# Patient Record
Sex: Female | Born: 1950 | Race: White | Hispanic: No | Marital: Single
Health system: Southern US, Community
[De-identification: ages and names within clinical notes are randomized; demographics above are authoritative.]

## PROBLEM LIST (undated history)

## (undated) DIAGNOSIS — I509 Heart failure, unspecified: Secondary | ICD-10-CM

## (undated) DIAGNOSIS — I10 Essential (primary) hypertension: Secondary | ICD-10-CM

## (undated) DIAGNOSIS — I350 Nonrheumatic aortic (valve) stenosis: Secondary | ICD-10-CM

## (undated) DIAGNOSIS — E119 Type 2 diabetes mellitus without complications: Secondary | ICD-10-CM

## (undated) DIAGNOSIS — N189 Chronic kidney disease, unspecified: Secondary | ICD-10-CM

## (undated) DIAGNOSIS — Z950 Presence of cardiac pacemaker: Secondary | ICD-10-CM

## (undated) DIAGNOSIS — J449 Chronic obstructive pulmonary disease, unspecified: Secondary | ICD-10-CM

## (undated) DIAGNOSIS — I4891 Unspecified atrial fibrillation: Secondary | ICD-10-CM

## (undated) HISTORY — PX: PACEMAKER INSERTION: SHX728

## (undated) HISTORY — PX: ABDOMINAL HYSTERECTOMY: SHX81

---

## 2015-06-12 ENCOUNTER — Inpatient Hospital Stay (HOSPITAL_COMMUNITY)
Admission: EM | Admit: 2015-06-12 | Discharge: 2015-06-30 | DRG: 291 | Disposition: A | Discharge status: Skilled Nursing Facility | Payer: BLUE CROSS/BLUE SHIELD | Attending: Internal Medicine | Admitting: Internal Medicine

## 2015-06-12 ENCOUNTER — Encounter (HOSPITAL_COMMUNITY): Payer: Self-pay | Admitting: *Deleted

## 2015-06-12 ENCOUNTER — Emergency Department (HOSPITAL_COMMUNITY): Payer: BLUE CROSS/BLUE SHIELD

## 2015-06-12 DIAGNOSIS — D123 Benign neoplasm of transverse colon: Secondary | ICD-10-CM | POA: Diagnosis present

## 2015-06-12 DIAGNOSIS — Z6841 Body Mass Index (BMI) 40.0 and over, adult: Secondary | ICD-10-CM

## 2015-06-12 DIAGNOSIS — E669 Obesity, unspecified: Secondary | ICD-10-CM

## 2015-06-12 DIAGNOSIS — I482 Chronic atrial fibrillation, unspecified: Secondary | ICD-10-CM | POA: Diagnosis present

## 2015-06-12 DIAGNOSIS — R0602 Shortness of breath: Secondary | ICD-10-CM | POA: Diagnosis not present

## 2015-06-12 DIAGNOSIS — N390 Urinary tract infection, site not specified: Secondary | ICD-10-CM | POA: Diagnosis present

## 2015-06-12 DIAGNOSIS — Z9119 Patient's noncompliance with other medical treatment and regimen: Secondary | ICD-10-CM

## 2015-06-12 DIAGNOSIS — K921 Melena: Secondary | ICD-10-CM | POA: Diagnosis not present

## 2015-06-12 DIAGNOSIS — E1122 Type 2 diabetes mellitus with diabetic chronic kidney disease: Secondary | ICD-10-CM | POA: Diagnosis present

## 2015-06-12 DIAGNOSIS — K648 Other hemorrhoids: Secondary | ICD-10-CM | POA: Diagnosis present

## 2015-06-12 DIAGNOSIS — Z7722 Contact with and (suspected) exposure to environmental tobacco smoke (acute) (chronic): Secondary | ICD-10-CM | POA: Diagnosis present

## 2015-06-12 DIAGNOSIS — Z9981 Dependence on supplemental oxygen: Secondary | ICD-10-CM

## 2015-06-12 DIAGNOSIS — Z7951 Long term (current) use of inhaled steroids: Secondary | ICD-10-CM

## 2015-06-12 DIAGNOSIS — J440 Chronic obstructive pulmonary disease with acute lower respiratory infection: Secondary | ICD-10-CM | POA: Diagnosis present

## 2015-06-12 DIAGNOSIS — I130 Hypertensive heart and chronic kidney disease with heart failure and stage 1 through stage 4 chronic kidney disease, or unspecified chronic kidney disease: Secondary | ICD-10-CM | POA: Diagnosis not present

## 2015-06-12 DIAGNOSIS — J209 Acute bronchitis, unspecified: Secondary | ICD-10-CM | POA: Diagnosis present

## 2015-06-12 DIAGNOSIS — I272 Other secondary pulmonary hypertension: Secondary | ICD-10-CM | POA: Diagnosis present

## 2015-06-12 DIAGNOSIS — Z7901 Long term (current) use of anticoagulants: Secondary | ICD-10-CM

## 2015-06-12 DIAGNOSIS — J45909 Unspecified asthma, uncomplicated: Secondary | ICD-10-CM | POA: Diagnosis present

## 2015-06-12 DIAGNOSIS — Z950 Presence of cardiac pacemaker: Secondary | ICD-10-CM

## 2015-06-12 DIAGNOSIS — I472 Ventricular tachycardia: Secondary | ICD-10-CM | POA: Diagnosis not present

## 2015-06-12 DIAGNOSIS — B964 Proteus (mirabilis) (morganii) as the cause of diseases classified elsewhere: Secondary | ICD-10-CM | POA: Diagnosis present

## 2015-06-12 DIAGNOSIS — N179 Acute kidney failure, unspecified: Secondary | ICD-10-CM | POA: Diagnosis present

## 2015-06-12 DIAGNOSIS — G4733 Obstructive sleep apnea (adult) (pediatric): Secondary | ICD-10-CM | POA: Diagnosis present

## 2015-06-12 DIAGNOSIS — E1169 Type 2 diabetes mellitus with other specified complication: Secondary | ICD-10-CM

## 2015-06-12 DIAGNOSIS — Z794 Long term (current) use of insulin: Secondary | ICD-10-CM

## 2015-06-12 DIAGNOSIS — I5041 Acute combined systolic (congestive) and diastolic (congestive) heart failure: Secondary | ICD-10-CM | POA: Diagnosis present

## 2015-06-12 DIAGNOSIS — D62 Acute posthemorrhagic anemia: Secondary | ICD-10-CM | POA: Diagnosis not present

## 2015-06-12 DIAGNOSIS — I509 Heart failure, unspecified: Secondary | ICD-10-CM

## 2015-06-12 DIAGNOSIS — K644 Residual hemorrhoidal skin tags: Secondary | ICD-10-CM | POA: Diagnosis present

## 2015-06-12 DIAGNOSIS — N183 Chronic kidney disease, stage 3 (moderate): Secondary | ICD-10-CM | POA: Diagnosis present

## 2015-06-12 DIAGNOSIS — D649 Anemia, unspecified: Secondary | ICD-10-CM | POA: Diagnosis present

## 2015-06-12 DIAGNOSIS — D638 Anemia in other chronic diseases classified elsewhere: Secondary | ICD-10-CM | POA: Diagnosis present

## 2015-06-12 DIAGNOSIS — I5021 Acute systolic (congestive) heart failure: Secondary | ICD-10-CM | POA: Diagnosis present

## 2015-06-12 DIAGNOSIS — D696 Thrombocytopenia, unspecified: Secondary | ICD-10-CM | POA: Diagnosis present

## 2015-06-12 DIAGNOSIS — Z90710 Acquired absence of both cervix and uterus: Secondary | ICD-10-CM

## 2015-06-12 DIAGNOSIS — Z79899 Other long term (current) drug therapy: Secondary | ICD-10-CM

## 2015-06-12 DIAGNOSIS — I4729 Other ventricular tachycardia: Secondary | ICD-10-CM | POA: Insufficient documentation

## 2015-06-12 DIAGNOSIS — N184 Chronic kidney disease, stage 4 (severe): Secondary | ICD-10-CM | POA: Insufficient documentation

## 2015-06-12 HISTORY — DX: Essential (primary) hypertension: I10

## 2015-06-12 HISTORY — DX: Type 2 diabetes mellitus without complications: E11.9

## 2015-06-12 HISTORY — DX: Chronic obstructive pulmonary disease, unspecified: J44.9

## 2015-06-12 HISTORY — DX: Presence of cardiac pacemaker: Z95.0

## 2015-06-12 HISTORY — DX: Unspecified atrial fibrillation: I48.91

## 2015-06-12 LAB — CBC
HEMATOCRIT: 31.1 % — AB (ref 36.0–46.0)
HEMOGLOBIN: 9.4 g/dL — AB (ref 12.0–15.0)
MCH: 28.5 pg (ref 26.0–34.0)
MCHC: 30.2 g/dL (ref 30.0–36.0)
MCV: 94.2 fL (ref 78.0–100.0)
Platelets: 139 10*3/uL — ABNORMAL LOW (ref 150–400)
RBC: 3.3 MIL/uL — ABNORMAL LOW (ref 3.87–5.11)
RDW: 18 % — ABNORMAL HIGH (ref 11.5–15.5)
WBC: 7.9 10*3/uL (ref 4.0–10.5)

## 2015-06-12 LAB — BASIC METABOLIC PANEL
Anion gap: 13 (ref 5–15)
BUN: 38 mg/dL — AB (ref 6–20)
CHLORIDE: 107 mmol/L (ref 101–111)
CO2: 22 mmol/L (ref 22–32)
CREATININE: 1.86 mg/dL — AB (ref 0.44–1.00)
Calcium: 9.6 mg/dL (ref 8.9–10.3)
GFR calc Af Amer: 31 mL/min — ABNORMAL LOW (ref >60)
GFR calc non Af Amer: 27 mL/min — ABNORMAL LOW (ref >60)
GLUCOSE: 117 mg/dL — AB (ref 65–99)
Potassium: 5 mmol/L (ref 3.5–5.1)
SODIUM: 142 mmol/L (ref 135–145)

## 2015-06-12 LAB — I-STAT TROPONIN, ED: TROPONIN I, POC: 0.01 ng/mL (ref 0.00–0.08)

## 2015-06-12 MED ORDER — FUROSEMIDE 10 MG/ML IJ SOLN
80.0000 mg | Freq: Once | INTRAMUSCULAR | Status: AC
  Administered 2015-06-13: 80 mg via INTRAVENOUS
  Filled 2015-06-12: qty 8

## 2015-06-12 NOTE — ED Notes (Addendum)
Pt ambulated to get weight, rechecked O2 sats after obtaining weight, pt's O2 dropped to 87%, pt placed on 2 lpm, O2 sats increased to 92%

## 2015-06-12 NOTE — ED Provider Notes (Signed)
CSN: [id number]     Arrival date & time 06/12/15  1900 History   By signing my name below, I, Robyn Porter, attest that this documentation has been prepared under the direction and in the presence of Orpah Greek, MD . Electronically Signed: [name], Scribe. 06/13/2015. 12:10 AM.    Chief Complaint  Patient presents with  . Shortness of Breath    The history is provided by the patient. No language interpreter was used.   HPI Comments:  Robyn Porter is a 65 y.o. female with a history of CHF, COPD and asthma, who presents to the Emergency Department complaining of increased SOB x a few days. Pt is on 2L home O2 and states she has needed to use it more in the last few weeks than usual. She reports associated weight gain; states she has gained 40 pounds in the last ~8-9 weeks.  Pt was seen by [facility] ED last night for similar symptoms; she was given lasix and steroids and discharged home. Pt was advised to come to the ED tonight by home health nurse. No alleviating factors noted.  Past Medical History  Diagnosis Date  . COPD (chronic obstructive pulmonary disease) ([place])   . Hypertension   . Diabetes mellitus without complication ([place])   . Afib ([place])   . Pacemaker    Past Surgical History  Procedure Laterality Date  . Pacemaker insertion    . Abdominal hysterectomy     History reviewed. No pertinent family history. Social History  Substance Use Topics  . Smoking status: Passive Smoke Exposure - Never Smoker  . Smokeless tobacco: Never Used  . Alcohol Use: No   OB History    No data available     Review of Systems  Constitutional: Positive for unexpected weight change (weight gain). Negative for chills and fatigue.  Respiratory: Positive for shortness of breath.   All other systems reviewed and are negative.   Allergies  Amoxicillin and Contrast media  Home Medications   Prior to Admission medications   Medication Sig Start Date End Date Taking?  Authorizing Provider  albuterol (PROVENTIL HFA;VENTOLIN HFA) 108 (90 Base) MCG/ACT inhaler Inhale 1 puff into the lungs every 6 (six) hours as needed for wheezing or shortness of breath.   Yes Historical Provider, MD  albuterol (PROVENTIL) (2.5 MG/3ML) 0.083% nebulizer solution Take 2.5 mg by nebulization every 8 (eight) hours as needed for wheezing.  05/22/15  Yes Historical Provider, MD  allopurinol (ZYLOPRIM) 100 MG tablet Take 100 mg by mouth daily.   Yes Historical Provider, MD  atorvastatin (LIPITOR) 20 MG tablet Take 20 mg by mouth daily.   Yes Historical Provider, MD  benzonatate (TESSALON) 200 MG capsule Take 200 mg by mouth 3 (three) times daily as needed for cough.   Yes Historical Provider, MD  budesonide-formoterol (SYMBICORT) 80-4.5 MCG/ACT inhaler Inhale 2 puffs into the lungs 2 (two) times daily.   Yes Historical Provider, MD  BYDUREON 2 MG PEN Inject 2 mg as directed every 7 (seven) days. On Saturday 05/29/15  Yes Historical Provider, MD  Cholecalciferol (VITAMIN D3) 5000 units CAPS Take 1 capsule by mouth daily.   Yes Historical Provider, MD  cyanocobalamin 500 MCG tablet Take 500 mcg by mouth daily.   Yes Historical Provider, MD  ferrous sulfate 325 (65 FE) MG tablet Take 650 mg by mouth 2 (two) times daily with a meal.   Yes Historical Provider, MD  furosemide (LASIX) 20 MG tablet Take 20 mg by mouth  daily as needed for fluid.   Yes Historical Provider, MD  loratadine (CLARITIN) 10 MG tablet Take 10 mg by mouth daily.   Yes Historical Provider, MD  losartan (COZAAR) 50 MG tablet Take 50 mg by mouth daily.   Yes Historical Provider, MD  metoprolol (LOPRESSOR) 50 MG tablet Take 50 mg by mouth 2 (two) times daily.   Yes Historical Provider, MD  Multiple Vitamin (MULTIVITAMIN WITH MINERALS) TABS tablet Take 1 tablet by mouth daily.   Yes Historical Provider, MD  rivaroxaban (XARELTO) 20 MG TABS tablet Take 20 mg by mouth daily with supper.   Yes Historical Provider, MD  vitamin C  (ASCORBIC ACID) 500 MG tablet Take 500 mg by mouth daily.   Yes Historical Provider, MD   BP 174/84 mmHg  Pulse 84  Temp(Src) 97.6 F (36.4 C) (Oral)  Resp 18  Wt 423 lb 11.2 oz (192.189 kg)  SpO2 98% Physical Exam  Constitutional: She is oriented to person, place, and time. She appears well-developed. No distress.  Obese  HENT:  Head: Normocephalic and atraumatic.  Right Ear: Hearing normal.  Left Ear: Hearing normal.  Nose: Nose normal.  Mouth/Throat: Oropharynx is clear and moist and mucous membranes are normal.  Eyes: Conjunctivae and EOM are normal. Pupils are equal, round, and reactive to light.  Neck: Normal range of motion. Neck supple.  Cardiovascular: Regular rhythm, S1 normal and S2 normal.  Exam reveals no gallop and no friction rub.   No murmur heard. Pulmonary/Chest: Effort normal. Tachypnea noted. No respiratory distress. She has rales. She exhibits no tenderness.  Tachypnic and dyspneic  Crackels at the basis   Abdominal: Soft. Normal appearance and bowel sounds are normal. There is no hepatosplenomegaly. There is no tenderness. There is no rebound, no guarding, no tenderness at McBurney's point and negative Murphy's sign. No hernia.  Musculoskeletal: Normal range of motion. She exhibits edema (4+ pitting edema bilaterally).  LLE weeping clear fluid without wounds   Neurological: She is alert and oriented to person, place, and time. She has normal strength. No cranial nerve deficit or sensory deficit. Coordination normal. GCS eye subscore is 4. GCS verbal subscore is 5. GCS motor subscore is 6.  Skin: Skin is warm, dry and intact. No rash noted. No cyanosis.  Psychiatric: She has a normal mood and affect. Her speech is normal and behavior is normal. Thought content normal.  Nursing note and vitals reviewed.   ED Course  Procedures   DIAGNOSTIC STUDIES:  Oxygen Saturation is 98% on RA, normal by my interpretation.    COORDINATION OF CARE:  11:33 PM Pt  informed of admission. Discussed treatment plan with pt at bedside and pt agreed to plan.  Labs Review Labs Reviewed  BASIC METABOLIC PANEL - Abnormal; Notable for the following:    Glucose, Bld 117 (*)    BUN 38 (*)    Creatinine, Ser 1.86 (*)    GFR calc non Af Amer 27 (*)    GFR calc Af Amer 31 (*)    All other components within normal limits  CBC - Abnormal; Notable for the following:    RBC 3.30 (*)    Hemoglobin 9.4 (*)    HCT 31.1 (*)    RDW 18.0 (*)    Platelets 139 (*)    All other components within normal limits  BRAIN NATRIURETIC PEPTIDE - Abnormal; Notable for the following:    B Natriuretic Peptide 940.9 (*)    All other components within normal  limits  Randolm Idol, ED    Imaging Review Dg Chest 2 View  06/12/2015  CLINICAL DATA:  65 year old female with shortness of breath and cough for the past month EXAM: CHEST  2 VIEW COMPARISON:  None. FINDINGS: Left subclavian approach single lead cardiac rhythm maintenance device with the lead projected over the right ventricle. Cardiomegaly. Pulmonary vascular congestion with mild interstitial edema. Small bilateral pleural effusions with associated bibasilar atelectasis. No acute osseous abnormality. IMPRESSION: Mild-moderate CHF. Electronically Signed   By: Jacqulynn Cadet M.D.   On: 06/12/2015 20:23   I have personally reviewed and evaluated these images and lab results as part of my medical decision-making.   EKG Interpretation   Date/Time:  Tuesday June 12 2015 19:12:48 EST Ventricular Rate:  78 PR Interval:    QRS Duration: 148 QT Interval:  432 QTC Calculation: 492 R Axis:   -65 Text Interpretation:  Ventricular-paced rhythm with occasional Premature  ventricular complexes Abnormal ECG No previous tracing Confirmed by  Jerald Villalona  MD, Aileene Lanum [id number]) on 06/13/2015 12:12:30 AM      MDM   Final diagnoses:  Acute on chronic congestive heart failure, unspecified congestive heart failure type [facility])     Patient presents to the ER for evaluation of difficulty breathing. Patient has a history of congestive heart failure and COPD. She reports that she has been seen in the emergency department twice over the last few weeks for difficulty breathing and volume overload. She was seen last night in [place], given IV Lasix and told to increase her oral Lasix. She was visited by home health nursing today who noted that she was severely dyspneic and her weight had increased, recommended coming back to the ER.  Patient has noticed severe decrease of her exercise tolerance. She is unable to ambulate at all secondary to severe dyspnea. She does use oxygen at home, as needed. She reports that she has not had any periods where she did not need it recently, however. She reports an  approximate 40 pound weight gain over the last 2 months.   EKG is ventricular paced. Troponin was negative. She does have an elevated BNP and chest x-ray shows evidence of congestive heart failure. Based on the fact that she was given IV Lasix last night in another ER and increased her oral Lasix with no improvement, she will require hospitalization for further management of congestive heart failure exacerbation.  I personally performed the services described in this documentation, which was scribed in my presence. The recorded information has been reviewed and is accurate.    Orpah Greek, MD 06/13/15 [phone]

## 2015-06-12 NOTE — ED Notes (Signed)
Pt coming from home with c/o shortness of breath. Pt was seen by [facility] ED last night for similar symptoms. Pt was seen by home health nurse today and suggested pt go to [facility] due to increase fluid retention and shortness of breath. Pt's home health nurse has not seen pt since January. Pt denies CP. Pt wears 2lpm at home, CPAP at bedtime.

## 2015-06-13 ENCOUNTER — Encounter (HOSPITAL_COMMUNITY): Payer: Self-pay | Admitting: Internal Medicine

## 2015-06-13 DIAGNOSIS — Z7901 Long term (current) use of anticoagulants: Secondary | ICD-10-CM | POA: Diagnosis not present

## 2015-06-13 DIAGNOSIS — Z794 Long term (current) use of insulin: Secondary | ICD-10-CM | POA: Diagnosis not present

## 2015-06-13 DIAGNOSIS — I509 Heart failure, unspecified: Secondary | ICD-10-CM | POA: Insufficient documentation

## 2015-06-13 DIAGNOSIS — D649 Anemia, unspecified: Secondary | ICD-10-CM | POA: Diagnosis present

## 2015-06-13 DIAGNOSIS — I5033 Acute on chronic diastolic (congestive) heart failure: Secondary | ICD-10-CM | POA: Diagnosis not present

## 2015-06-13 DIAGNOSIS — I130 Hypertensive heart and chronic kidney disease with heart failure and stage 1 through stage 4 chronic kidney disease, or unspecified chronic kidney disease: Secondary | ICD-10-CM | POA: Diagnosis present

## 2015-06-13 DIAGNOSIS — J440 Chronic obstructive pulmonary disease with acute lower respiratory infection: Secondary | ICD-10-CM | POA: Diagnosis present

## 2015-06-13 DIAGNOSIS — N179 Acute kidney failure, unspecified: Secondary | ICD-10-CM | POA: Diagnosis present

## 2015-06-13 DIAGNOSIS — I429 Cardiomyopathy, unspecified: Secondary | ICD-10-CM | POA: Diagnosis not present

## 2015-06-13 DIAGNOSIS — Z950 Presence of cardiac pacemaker: Secondary | ICD-10-CM

## 2015-06-13 DIAGNOSIS — Z9119 Patient's noncompliance with other medical treatment and regimen: Secondary | ICD-10-CM | POA: Diagnosis not present

## 2015-06-13 DIAGNOSIS — D638 Anemia in other chronic diseases classified elsewhere: Secondary | ICD-10-CM | POA: Diagnosis present

## 2015-06-13 DIAGNOSIS — E119 Type 2 diabetes mellitus without complications: Secondary | ICD-10-CM | POA: Diagnosis not present

## 2015-06-13 DIAGNOSIS — I5023 Acute on chronic systolic (congestive) heart failure: Secondary | ICD-10-CM | POA: Diagnosis not present

## 2015-06-13 DIAGNOSIS — D123 Benign neoplasm of transverse colon: Secondary | ICD-10-CM | POA: Diagnosis not present

## 2015-06-13 DIAGNOSIS — Z79899 Other long term (current) drug therapy: Secondary | ICD-10-CM | POA: Diagnosis not present

## 2015-06-13 DIAGNOSIS — I5021 Acute systolic (congestive) heart failure: Secondary | ICD-10-CM | POA: Diagnosis not present

## 2015-06-13 DIAGNOSIS — K648 Other hemorrhoids: Secondary | ICD-10-CM | POA: Diagnosis present

## 2015-06-13 DIAGNOSIS — Z9981 Dependence on supplemental oxygen: Secondary | ICD-10-CM | POA: Diagnosis not present

## 2015-06-13 DIAGNOSIS — K644 Residual hemorrhoidal skin tags: Secondary | ICD-10-CM | POA: Diagnosis present

## 2015-06-13 DIAGNOSIS — G4733 Obstructive sleep apnea (adult) (pediatric): Secondary | ICD-10-CM | POA: Diagnosis present

## 2015-06-13 DIAGNOSIS — E669 Obesity, unspecified: Secondary | ICD-10-CM | POA: Diagnosis not present

## 2015-06-13 DIAGNOSIS — I482 Chronic atrial fibrillation, unspecified: Secondary | ICD-10-CM | POA: Diagnosis present

## 2015-06-13 DIAGNOSIS — Z6841 Body Mass Index (BMI) 40.0 and over, adult: Secondary | ICD-10-CM | POA: Diagnosis not present

## 2015-06-13 DIAGNOSIS — D62 Acute posthemorrhagic anemia: Secondary | ICD-10-CM | POA: Diagnosis not present

## 2015-06-13 DIAGNOSIS — N183 Chronic kidney disease, stage 3 (moderate): Secondary | ICD-10-CM | POA: Diagnosis present

## 2015-06-13 DIAGNOSIS — B964 Proteus (mirabilis) (morganii) as the cause of diseases classified elsewhere: Secondary | ICD-10-CM | POA: Diagnosis present

## 2015-06-13 DIAGNOSIS — J209 Acute bronchitis, unspecified: Secondary | ICD-10-CM | POA: Diagnosis present

## 2015-06-13 DIAGNOSIS — K921 Melena: Secondary | ICD-10-CM | POA: Diagnosis not present

## 2015-06-13 DIAGNOSIS — Z7951 Long term (current) use of inhaled steroids: Secondary | ICD-10-CM | POA: Diagnosis not present

## 2015-06-13 DIAGNOSIS — N390 Urinary tract infection, site not specified: Secondary | ICD-10-CM | POA: Diagnosis present

## 2015-06-13 DIAGNOSIS — J45909 Unspecified asthma, uncomplicated: Secondary | ICD-10-CM | POA: Diagnosis present

## 2015-06-13 DIAGNOSIS — D696 Thrombocytopenia, unspecified: Secondary | ICD-10-CM | POA: Diagnosis present

## 2015-06-13 DIAGNOSIS — E1122 Type 2 diabetes mellitus with diabetic chronic kidney disease: Secondary | ICD-10-CM | POA: Diagnosis present

## 2015-06-13 DIAGNOSIS — Z90710 Acquired absence of both cervix and uterus: Secondary | ICD-10-CM | POA: Diagnosis not present

## 2015-06-13 DIAGNOSIS — E1169 Type 2 diabetes mellitus with other specified complication: Secondary | ICD-10-CM

## 2015-06-13 DIAGNOSIS — R0602 Shortness of breath: Secondary | ICD-10-CM | POA: Diagnosis present

## 2015-06-13 DIAGNOSIS — Z7722 Contact with and (suspected) exposure to environmental tobacco smoke (acute) (chronic): Secondary | ICD-10-CM | POA: Diagnosis present

## 2015-06-13 DIAGNOSIS — R0600 Dyspnea, unspecified: Secondary | ICD-10-CM | POA: Diagnosis not present

## 2015-06-13 DIAGNOSIS — I5041 Acute combined systolic (congestive) and diastolic (congestive) heart failure: Secondary | ICD-10-CM | POA: Diagnosis present

## 2015-06-13 DIAGNOSIS — I472 Ventricular tachycardia: Secondary | ICD-10-CM | POA: Diagnosis not present

## 2015-06-13 DIAGNOSIS — I272 Other secondary pulmonary hypertension: Secondary | ICD-10-CM | POA: Diagnosis present

## 2015-06-13 LAB — CBC WITH DIFFERENTIAL/PLATELET
Basophils Absolute: 0 10*3/uL (ref 0.0–0.1)
Basophils Relative: 0 %
EOS ABS: 0 10*3/uL (ref 0.0–0.7)
Eosinophils Relative: 0 %
HEMATOCRIT: 30.2 % — AB (ref 36.0–46.0)
HEMOGLOBIN: 9.1 g/dL — AB (ref 12.0–15.0)
LYMPHS ABS: 1.1 10*3/uL (ref 0.7–4.0)
LYMPHS PCT: 11 %
MCH: 28.5 pg (ref 26.0–34.0)
MCHC: 30.1 g/dL (ref 30.0–36.0)
MCV: 94.7 fL (ref 78.0–100.0)
MONOS PCT: 7 %
Monocytes Absolute: 0.7 10*3/uL (ref 0.1–1.0)
NEUTROS ABS: 7.6 10*3/uL (ref 1.7–7.7)
NEUTROS PCT: 81 %
Platelets: 152 10*3/uL (ref 150–400)
RBC: 3.19 MIL/uL — ABNORMAL LOW (ref 3.87–5.11)
RDW: 17.9 % — ABNORMAL HIGH (ref 11.5–15.5)
WBC: 9.4 10*3/uL (ref 4.0–10.5)

## 2015-06-13 LAB — URINE MICROSCOPIC-ADD ON

## 2015-06-13 LAB — GLUCOSE, CAPILLARY
GLUCOSE-CAPILLARY: 86 mg/dL (ref 65–99)
GLUCOSE-CAPILLARY: 118 mg/dL — AB (ref 65–99)
Glucose-Capillary: 127 mg/dL — ABNORMAL HIGH (ref 65–99)
Glucose-Capillary: 90 mg/dL (ref 65–99)
Glucose-Capillary: 127 mg/dL — ABNORMAL HIGH (ref 65–99)

## 2015-06-13 LAB — URINALYSIS, ROUTINE W REFLEX MICROSCOPIC
Bilirubin Urine: NEGATIVE
Glucose, UA: NEGATIVE mg/dL
Hgb urine dipstick: NEGATIVE
Ketones, ur: NEGATIVE mg/dL
NITRITE: POSITIVE — AB
PH: 5 (ref 5.0–8.0)
Protein, ur: 30 mg/dL — AB
SPECIFIC GRAVITY, URINE: 1.017 (ref 1.005–1.030)

## 2015-06-13 LAB — BASIC METABOLIC PANEL
ANION GAP: 9 (ref 5–15)
BUN: 42 mg/dL — AB (ref 6–20)
CO2: 24 mmol/L (ref 22–32)
Calcium: 9.3 mg/dL (ref 8.9–10.3)
Chloride: 110 mmol/L (ref 101–111)
Creatinine, Ser: 1.96 mg/dL — ABNORMAL HIGH (ref 0.44–1.00)
GFR calc Af Amer: 29 mL/min — ABNORMAL LOW (ref >60)
GFR calc non Af Amer: 25 mL/min — ABNORMAL LOW (ref >60)
GLUCOSE: 130 mg/dL — AB (ref 65–99)
POTASSIUM: 4.9 mmol/L (ref 3.5–5.1)
Sodium: 143 mmol/L (ref 135–145)

## 2015-06-13 LAB — TROPONIN I
TROPONIN I: 0.03 ng/mL (ref <0.031)
Troponin I: 0.03 ng/mL (ref <0.031)
Troponin I: 0.03 ng/mL (ref <0.031)

## 2015-06-13 LAB — BRAIN NATRIURETIC PEPTIDE: B Natriuretic Peptide: 940.9 pg/mL — ABNORMAL HIGH (ref 0.0–100.0)

## 2015-06-13 MED ORDER — FUROSEMIDE 10 MG/ML IJ SOLN
60.0000 mg | Freq: Two times a day (BID) | INTRAMUSCULAR | Status: DC
  Administered 2015-06-13: 60 mg via INTRAVENOUS
  Filled 2015-06-13: qty 6

## 2015-06-13 MED ORDER — ALLOPURINOL 100 MG PO TABS
100.0000 mg | ORAL_TABLET | Freq: Every day | ORAL | Status: DC
  Administered 2015-06-13 – 2015-06-30 (×18): 100 mg via ORAL
  Filled 2015-06-13 (×18): qty 1

## 2015-06-13 MED ORDER — RIVAROXABAN 20 MG PO TABS
20.0000 mg | ORAL_TABLET | Freq: Every day | ORAL | Status: DC
  Administered 2015-06-13 – 2015-06-24 (×12): 20 mg via ORAL
  Filled 2015-06-13 (×12): qty 1

## 2015-06-13 MED ORDER — ATORVASTATIN CALCIUM 20 MG PO TABS
20.0000 mg | ORAL_TABLET | Freq: Every day | ORAL | Status: DC
  Administered 2015-06-13 – 2015-06-30 (×18): 20 mg via ORAL
  Filled 2015-06-13 (×18): qty 1

## 2015-06-13 MED ORDER — INSULIN ASPART 100 UNIT/ML ~~LOC~~ SOLN
0.0000 [IU] | Freq: Three times a day (TID) | SUBCUTANEOUS | Status: DC
  Administered 2015-06-14: 1 [IU] via SUBCUTANEOUS
  Administered 2015-06-15: 2 [IU] via SUBCUTANEOUS
  Administered 2015-06-18 – 2015-06-22 (×4): 1 [IU] via SUBCUTANEOUS

## 2015-06-13 MED ORDER — SODIUM CHLORIDE 0.9% FLUSH
3.0000 mL | Freq: Two times a day (BID) | INTRAVENOUS | Status: DC
  Administered 2015-06-13 – 2015-06-22 (×18): 3 mL via INTRAVENOUS

## 2015-06-13 MED ORDER — ALBUTEROL SULFATE (2.5 MG/3ML) 0.083% IN NEBU: 2.5000 mg | INHALATION_SOLUTION | Freq: Four times a day (QID) | RESPIRATORY_TRACT | Status: DC | PRN

## 2015-06-13 MED ORDER — FERROUS SULFATE 325 (65 FE) MG PO TABS
650.0000 mg | ORAL_TABLET | Freq: Two times a day (BID) | ORAL | Status: DC
  Administered 2015-06-13 – 2015-06-24 (×24): 650 mg via ORAL
  Filled 2015-06-13 (×25): qty 2

## 2015-06-13 MED ORDER — ONDANSETRON HCL 4 MG PO TABS: 4.0000 mg | ORAL_TABLET | Freq: Four times a day (QID) | ORAL | Status: DC | PRN

## 2015-06-13 MED ORDER — LOSARTAN POTASSIUM 50 MG PO TABS
50.0000 mg | ORAL_TABLET | Freq: Every day | ORAL | Status: DC
  Administered 2015-06-13 – 2015-06-20 (×8): 50 mg via ORAL
  Filled 2015-06-13 (×8): qty 1

## 2015-06-13 MED ORDER — ADULT MULTIVITAMIN W/MINERALS CH
1.0000 | ORAL_TABLET | Freq: Every day | ORAL | Status: DC
  Administered 2015-06-13 – 2015-06-30 (×18): 1 via ORAL
  Filled 2015-06-13 (×18): qty 1

## 2015-06-13 MED ORDER — BUDESONIDE-FORMOTEROL FUMARATE 80-4.5 MCG/ACT IN AERO
2.0000 | INHALATION_SPRAY | Freq: Two times a day (BID) | RESPIRATORY_TRACT | Status: DC
  Administered 2015-06-13 – 2015-06-30 (×33): 2 via RESPIRATORY_TRACT
  Filled 2015-06-13 (×2): qty 6.9

## 2015-06-13 MED ORDER — METOPROLOL TARTRATE 50 MG PO TABS
50.0000 mg | ORAL_TABLET | Freq: Two times a day (BID) | ORAL | Status: DC
  Administered 2015-06-13 – 2015-06-14 (×4): 50 mg via ORAL
  Filled 2015-06-13 (×5): qty 1

## 2015-06-13 MED ORDER — ALBUTEROL SULFATE HFA 108 (90 BASE) MCG/ACT IN AERS: 1.0000 | INHALATION_SPRAY | Freq: Four times a day (QID) | RESPIRATORY_TRACT | Status: DC | PRN

## 2015-06-13 MED ORDER — PREDNISONE 20 MG PO TABS
40.0000 mg | ORAL_TABLET | Freq: Every day | ORAL | Status: AC
  Administered 2015-06-13 – 2015-06-15 (×3): 40 mg via ORAL
  Filled 2015-06-13 (×3): qty 2

## 2015-06-13 MED ORDER — VITAMIN D 1000 UNITS PO TABS
5000.0000 [IU] | ORAL_TABLET | Freq: Every day | ORAL | Status: DC
  Administered 2015-06-13 – 2015-06-30 (×17): 5000 [IU] via ORAL
  Filled 2015-06-13 (×17): qty 5

## 2015-06-13 MED ORDER — VITAMIN B-12 1000 MCG PO TABS
500.0000 ug | ORAL_TABLET | Freq: Every day | ORAL | Status: DC
  Administered 2015-06-13 – 2015-06-30 (×18): 500 ug via ORAL
  Filled 2015-06-13 (×18): qty 1

## 2015-06-13 MED ORDER — LORATADINE 10 MG PO TABS
10.0000 mg | ORAL_TABLET | Freq: Every day | ORAL | Status: DC
  Administered 2015-06-13 – 2015-06-30 (×18): 10 mg via ORAL
  Filled 2015-06-13 (×19): qty 1

## 2015-06-13 MED ORDER — VITAMIN C 500 MG PO TABS
500.0000 mg | ORAL_TABLET | Freq: Every day | ORAL | Status: DC
  Administered 2015-06-13 – 2015-06-30 (×18): 500 mg via ORAL
  Filled 2015-06-13 (×18): qty 1

## 2015-06-13 MED ORDER — ONDANSETRON HCL 4 MG/2ML IJ SOLN: 4.0000 mg | Freq: Four times a day (QID) | INTRAMUSCULAR | Status: DC | PRN

## 2015-06-13 MED ORDER — FUROSEMIDE 10 MG/ML IJ SOLN
40.0000 mg | Freq: Two times a day (BID) | INTRAMUSCULAR | Status: DC
Start: 2015-06-13 — End: 2015-06-14
  Administered 2015-06-13 – 2015-06-14 (×2): 40 mg via INTRAVENOUS
  Filled 2015-06-13 (×2): qty 4

## 2015-06-13 MED ORDER — DEXTROSE 5 % IV SOLN: 1.0000 g | INTRAVENOUS | Status: DC

## 2015-06-13 MED ORDER — IPRATROPIUM-ALBUTEROL 0.5-2.5 (3) MG/3ML IN SOLN
3.0000 mL | Freq: Four times a day (QID) | RESPIRATORY_TRACT | Status: DC
  Administered 2015-06-13 (×4): 3 mL via RESPIRATORY_TRACT
  Filled 2015-06-13 (×4): qty 3

## 2015-06-13 MED ORDER — ACETAMINOPHEN 325 MG PO TABS
650.0000 mg | ORAL_TABLET | Freq: Four times a day (QID) | ORAL | Status: DC | PRN
  Administered 2015-06-22: 650 mg via ORAL
  Filled 2015-06-13: qty 2

## 2015-06-13 MED ORDER — BENZONATATE 100 MG PO CAPS
200.0000 mg | ORAL_CAPSULE | Freq: Three times a day (TID) | ORAL | Status: DC | PRN
  Administered 2015-06-24 – 2015-06-30 (×6): 200 mg via ORAL
  Filled 2015-06-13 (×7): qty 2

## 2015-06-13 MED ORDER — ACETAMINOPHEN 650 MG RE SUPP: 650.0000 mg | Freq: Four times a day (QID) | RECTAL | Status: DC | PRN

## 2015-06-13 NOTE — ED Notes (Signed)
Attempted to call report

## 2015-06-13 NOTE — H&P (Signed)
[facility] History and Physical  Robyn Porter DOB: [birth year] DOA: 06/12/2015  Referring physician: Dr. Tawanna Porter. PCP: No primary care provider on file. patient follows up at [facility]. Specialists: None.  Chief Complaint: Shortness of breath.  HPI: Robyn Porter is a 65 y.o. female with history of diastolic CHF last EF measured in November 2016 was 50-55% presents to the ER because of increasing shortness of breath and increased weight. Patient states that over the last 1 month patient has been noticing increasing shortness of breath both on sitting and exertion. Denies any chest pain. Patient's Lasix was recently increased to 20 mg twice a day. Patient states she has been to the ER at least twice over the last 1 month. Patient was admitted at [facility] in November for Streptococcus bacteremia secondary to cellulitis at that time patient had 2-D echo and transesophageal echocardiogram which showed EF of 50-55%. At this time chest x-ray shows congestion and patient has been placed on Lasix and admitted for CHF. Patient denies any nausea vomiting abdominal pain or diarrhea.   Review of Systems: As presented in the history of presenting illness, rest negative.  Past Medical History  Diagnosis Date  . COPD (chronic obstructive pulmonary disease) ([place])   . Hypertension   . Diabetes mellitus without complication ([place])   . Afib ([place])   . Pacemaker    Past Surgical History  Procedure Laterality Date  . Pacemaker insertion    . Abdominal hysterectomy     Social History:  reports that she has been passively smoking.  She has never used smokeless tobacco. She reports that she does not drink alcohol or use illicit drugs. Where does patient live home. Can patient participate in ADLs? Yes.  Allergies  Allergen Reactions  . Amoxicillin Hives  . Contrast Media [Iodinated Diagnostic Agents] Hives    Family History:   Family History  Problem Relation Age of Onset  . Angina Sister   . Emphysema Sister   . Colon cancer Brother       Prior to Admission medications   Medication Sig Start Date End Date Taking? Authorizing Provider  albuterol (PROVENTIL HFA;VENTOLIN HFA) 108 (90 Base) MCG/ACT inhaler Inhale 1 puff into the lungs every 6 (six) hours as needed for wheezing or shortness of breath.   Yes Historical Provider, MD  albuterol (PROVENTIL) (2.5 MG/3ML) 0.083% nebulizer solution Take 2.5 mg by nebulization every 8 (eight) hours as needed for wheezing.  05/22/15  Yes Historical Provider, MD  allopurinol (ZYLOPRIM) 100 MG tablet Take 100 mg by mouth daily.   Yes Historical Provider, MD  atorvastatin (LIPITOR) 20 MG tablet Take 20 mg by mouth daily.   Yes Historical Provider, MD  benzonatate (TESSALON) 200 MG capsule Take 200 mg by mouth 3 (three) times daily as needed for cough.   Yes Historical Provider, MD  budesonide-formoterol (SYMBICORT) 80-4.5 MCG/ACT inhaler Inhale 2 puffs into the lungs 2 (two) times daily.   Yes Historical Provider, MD  BYDUREON 2 MG PEN Inject 2 mg as directed every 7 (seven) days. On Saturday 05/29/15  Yes Historical Provider, MD  Cholecalciferol (VITAMIN D3) 5000 units CAPS Take 1 capsule by mouth daily.   Yes Historical Provider, MD  cyanocobalamin 500 MCG tablet Take 500 mcg by mouth daily.   Yes Historical Provider, MD  ferrous sulfate 325 (65 FE) MG tablet Take 650 mg by mouth 2 (two) times daily with a meal.   Yes Historical Provider, MD  furosemide (  LASIX) 20 MG tablet Take 20 mg by mouth daily as needed for fluid.   Yes Historical Provider, MD  loratadine (CLARITIN) 10 MG tablet Take 10 mg by mouth daily.   Yes Historical Provider, MD  losartan (COZAAR) 50 MG tablet Take 50 mg by mouth daily.   Yes Historical Provider, MD  metoprolol (LOPRESSOR) 50 MG tablet Take 50 mg by mouth 2 (two) times daily.   Yes Historical Provider, MD  Multiple Vitamin (MULTIVITAMIN WITH MINERALS)  TABS tablet Take 1 tablet by mouth daily.   Yes Historical Provider, MD  rivaroxaban (XARELTO) 20 MG TABS tablet Take 20 mg by mouth daily with supper.   Yes Historical Provider, MD  vitamin C (ASCORBIC ACID) 500 MG tablet Take 500 mg by mouth daily.   Yes Historical Provider, MD    Physical Exam: Filed Vitals:   06/12/15 1915 06/12/15 2119 06/12/15 2345 06/13/15 0209  BP: 141/82 135/62 174/84 128/61  Pulse: 57 73 84 70  Temp: 97.8 F (36.6 C) 97.6 F (36.4 C)    TempSrc: Oral Oral    Resp: 24 20 18 28   Weight: 192.189 kg (423 lb 11.2 oz)     SpO2: 93% 98% 98% 100%     General:  Moderately built and nourished.  Eyes: Anicteric no pallor.  ENT: No discharge from the ears eyes nose mouth.  Neck: JVD not appreciated.  Cardiovascular: S1 and S2 heard.  Respiratory: No rhonchi or crepitations.  Abdomen: Soft nontender bowel sounds present.  Skin: Chronic skin changes.  Musculoskeletal: Bilateral lower extremity edema.  Psychiatric: Appears normal.  Neurologic: Alert awake oriented to time place and person. Moves all extremities.  Labs on Admission:  Basic Metabolic Panel:  Recent Labs Lab 06/12/15 1938  NA 142  K 5.0  CL 107  CO2 22  GLUCOSE 117*  BUN 38*  CREATININE 1.86*  CALCIUM 9.6   Liver Function Tests: No results for input(s): AST, ALT, ALKPHOS, BILITOT, PROT, ALBUMIN in the last 168 hours. No results for input(s): LIPASE, AMYLASE in the last 168 hours. No results for input(s): AMMONIA in the last 168 hours. CBC:  Recent Labs Lab 06/12/15 1938  WBC 7.9  HGB 9.4*  HCT 31.1*  MCV 94.2  PLT 139*   Cardiac Enzymes: No results for input(s): CKTOTAL, CKMB, CKMBINDEX, TROPONINI in the last 168 hours.  BNP (last 3 results)  Recent Labs  06/12/15 2328  BNP 940.9*    ProBNP (last 3 results) No results for input(s): PROBNP in the last 8760 hours.  CBG: No results for input(s): GLUCAP in the last 168 hours.  Radiological Exams on  Admission: Dg Chest 2 View  06/12/2015  CLINICAL DATA:  64 year old female with shortness of breath and cough for the past month EXAM: CHEST  2 VIEW COMPARISON:  None. FINDINGS: Left subclavian approach single lead cardiac rhythm maintenance device with the lead projected over the right ventricle. Cardiomegaly. Pulmonary vascular congestion with mild interstitial edema. Small bilateral pleural effusions with associated bibasilar atelectasis. No acute osseous abnormality. IMPRESSION: Mild-moderate CHF. Electronically Signed   By: Jacqulynn Cadet M.D.   On: 06/12/2015 20:23    EKG: Independently reviewed. Ventricular paced rhythm.  Assessment/Plan Principal Problem:   Acute on chronic diastolic CHF (congestive heart failure), NYHA class 1 (HCC) Active Problems:   CHF (congestive heart failure) (HCC)   Chronic atrial fibrillation (HCC)   Diabetes mellitus type 2 in obese [facility])   History of pacemaker   Chronic anemia  OSA (obstructive sleep apnea)   1. Acute on chronic diastolic CHF last year which was around 50-50% in November 2016 - patient has been placed on Lasix 40 mg IV every 12 hourly. Closely follow intake output metabolic panel and daily weights. Patient is on ARB. 2. Chronic atrial fibrillation - presently rate controlled continue metoprolol. Patient is on Xarelto. Chads 2 vasc score is more than 2. 3. Hypertension - Continue metoprolol. Patient is on ARB and has renal failure if creatinine worsens may have to hold ARB.  4. Renal failure probably chronic  - closely follow metabolic panel. See #3.  5. History of pacemaker placement. 6. OSA on CPAP. 7. Anemia  -  follow CBC.  I have reviewed patient's charts brought from [facility] during her admission in November 2016 for bacteremia.   DVT ProphylaxiS -Xarelto.  Code Status: Full code.  Family Communication: Discussed with patient.  Disposition Plan: Admit to inpatient.    KAKRAKANDY,ARSHAD N. [facility] Pager [phone].  If 7PM-7AM, please contact night-coverage [email or web address] Password [email or web address] 06/13/2015, 2:22 AM       \

## 2015-06-13 NOTE — ED Notes (Signed)
Admitting MD at bedside.

## 2015-06-13 NOTE — Progress Notes (Signed)
Patient arrived from [facility] ED admit for SOB/CHF. Alert and oriented X 4. V paced on telemetry. Foley cath placed in ED. Clear yellow urine. Bilateral Lower extremity edema(+3). No open wounds noted. Lower legs skin dry and scaly. Patient is high fall risk. Had fall at home. Patient oriented to call bell and room.

## 2015-06-13 NOTE — ED Notes (Signed)
Pt given water 

## 2015-06-13 NOTE — Progress Notes (Signed)
[facility]                                                                              Patient Demographics  Robyn Porter, is a 65 y.o. female, DOB - [birth year], [id number]  Admit date - 06/12/2015   Admitting Physician [name], MD  Outpatient Primary MD for the patient is No primary care provider on file.  LOS - 0   Chief Complaint  Patient presents with  . Shortness of Breath       Brief HPI    Robyn Porter is a 65 y.o. female with history of diastolic CHF last EF measured in November 2016 was 50-55% presented to the ER because of increasing shortness of breath and increased weight. Patient states that over the last 1 month patient has been noticing increasing shortness of breath both on sitting and exertion. Denied any chest pain. Patient's Lasix was recently increased to 20 mg twice a day. Patient reported that she had been to the [facility] ER at least twice over the last 1 month. Patient was admitted at [facility] in November for Streptococcus bacteremia secondary to cellulitis at that time patient had 2-D echo and transesophageal echocardiogram which showed EF of 50-55%. At this time chest x-ray shows congestion and patient has been placed on Lasix and admitted for CHF.    Assessment & Plan    Principal Problem:   Acute on chronic diastolic CHF (congestive heart failure), NYHA class 1 ([place]): BNP 940 - Continue Lasix 40 mg IV q12hrs  - Continue strict I's and O's and daily weights - Obtain 2-D echocardiogram, chest x-ray showed mild to moderate CHF  Active Problems: Acute bronchitis/COPD - Placed on DuoNeb's 4 times a day, Symbicort, prednisone  ? UTI - Currently no symptoms of dysuria, fevers or leukocytosis - Hold off on antibiotics, follow cultures closely - Patient reported that she was admitted in [facility] last month and had C. difficile positive     Chronic atrial fibrillation ([place]) - Rate  controlled, continue metoprolol - [place] vasc score 4, continue xarelto    Diabetes mellitus type 2 in obese ([place]) - Placed on sliding scale insulin, follow CBGs    History of pacemaker    Chronic anemia - Follow anemia panel    OSA (obstructive sleep apnea) Continue CPAP   ? Chronic kidney disease  - Baseline not available   Code Status: Full CODE STATUS   Family Communication: Discussed in detail with the patient, all imaging results, lab results explained to the patient   Disposition Plan:   Time Spent in minutes   25 minutes  Procedures  None   Consults   None   DVT Prophylaxis  rivaroxaban  Medications  Scheduled Meds: . allopurinol  100 mg Oral Daily  . atorvastatin  20 mg Oral Daily  . budesonide-formoterol  2 puff Inhalation BID  . cholecalciferol  5,000 Units Oral Daily  . ferrous sulfate  650 mg Oral BID WC  . furosemide  60 mg Intravenous BID  . insulin aspart  0-9 Units Subcutaneous TID WC  . ipratropium-albuterol  3 mL  Nebulization QID  . loratadine  10 mg Oral Daily  . losartan  50 mg Oral Daily  . metoprolol  50 mg Oral BID  . multivitamin with minerals  1 tablet Oral Daily  . predniSONE  40 mg Oral Q breakfast  . rivaroxaban  20 mg Oral Q supper  . sodium chloride flush  3 mL Intravenous Q12H  . cyanocobalamin  500 mcg Oral Daily  . vitamin C  500 mg Oral Daily   Continuous Infusions:  PRN Meds:.acetaminophen **OR** acetaminophen, albuterol, benzonatate, ondansetron **OR** ondansetron (ZOFRAN) IV   Antibiotics   Anti-infectives    Start     Dose/Rate Route Frequency Ordered Stop   06/13/15 0915  cefTRIAXone (ROCEPHIN) 1 g in dextrose 5 % 50 mL IVPB  Status:  Discontinued     1 g 100 mL/hr over 30 Minutes Intravenous Every 24 hours 06/13/15 0912 06/13/15 0915        Subjective:   Robyn Porter was seen and examined today. Shortness of breath but no chest pain. No fevers or chills. + Coughing.  Patient denies abdominal pain,  N/V/D/C, new weakness, numbess, tingling. No acute events overnight.    Objective:   Filed Vitals:   06/13/15 0301 06/13/15 1029 06/13/15 1128 06/13/15 1236  BP: 125/81  112/52   Pulse: 81  60   Temp: 98 F (36.7 C)  97.5 F (36.4 C)   TempSrc: Oral  Oral   Resp: 18  20   Height: 5\' 4"  (1.626 m)     Weight: 192.416 kg (424 lb 3.2 oz)     SpO2: 100% 98% 99% 98%    Intake/Output Summary (Last 24 hours) at 06/13/15 1353 Last data filed at 06/13/15 1031  Gross per 24 hour  Intake    655 ml  Output    900 ml  Net   -245 ml     Wt Readings from Last 3 Encounters:  06/13/15 192.416 kg (424 lb 3.2 oz)     Exam  General: Alert and oriented x 3, NAD  HEENT:  PERRLA, EOMI, Anicteric Sclera, mucous membranes moist.   Neck: Supple, no JVD, no masses  CVS: S1 S2 auscultated, no rubs, murmurs or gallops. Regular rate and rhythm.  RespiratoryDecreased breath sounds at the bases with scattered wheezing  Abdomen : Soft, nontender, nondistended, + bowel sounds  Ext: no cyanosis clubbing, 1+ edema  Neuro: AAOx3, Cr N's II- XII. Strength 5/5 upper and lower extremities bilaterally  Skin: No rashes  Psych: Normal affect and demeanor, alert and oriented x3    Data Review   Micro Results No results found for this or any previous visit (from the past 240 hour(s)).  Radiology Reports Dg Chest 2 View  06/12/2015  CLINICAL DATA:  65 year old female with shortness of breath and cough for the past month EXAM: CHEST  2 VIEW COMPARISON:  None. FINDINGS: Left subclavian approach single lead cardiac rhythm maintenance device with the lead projected over the right ventricle. Cardiomegaly. Pulmonary vascular congestion with mild interstitial edema. Small bilateral pleural effusions with associated bibasilar atelectasis. No acute osseous abnormality. IMPRESSION: Mild-moderate CHF. Electronically Signed   By: Jacqulynn Cadet M.D.   On: 06/12/2015 20:23    CBC  Recent Labs Lab  06/12/15 1938 06/13/15 0322  WBC 7.9 9.4  HGB 9.4* 9.1*  HCT 31.1* 30.2*  PLT 139* 152  MCV 94.2 94.7  MCH 28.5 28.5  MCHC 30.2 30.1  RDW 18.0* 17.9*  LYMPHSABS  --  1.1  MONOABS  --  0.7  EOSABS  --  0.0  BASOSABS  --  0.0    Chemistries   Recent Labs Lab 06/12/15 1938 06/13/15 0916  NA 142 143  K 5.0 4.9  CL 107 110  CO2 22 24  GLUCOSE 117* 130*  BUN 38* 42*  CREATININE 1.86* 1.96*  CALCIUM 9.6 9.3   ------------------------------------------------------------------------------------------------------------------ estimated creatinine clearance is 48.9 mL/min (by C-G formula based on Cr of 1.96). ------------------------------------------------------------------------------------------------------------------ No results for input(s): HGBA1C in the last 72 hours. ------------------------------------------------------------------------------------------------------------------ No results for input(s): CHOL, HDL, LDLCALC, TRIG, CHOLHDL, LDLDIRECT in the last 72 hours. ------------------------------------------------------------------------------------------------------------------ No results for input(s): TSH, T4TOTAL, T3FREE, THYROIDAB in the last 72 hours.  Invalid input(s): FREET3 ------------------------------------------------------------------------------------------------------------------ No results for input(s): VITAMINB12, FOLATE, FERRITIN, TIBC, IRON, RETICCTPCT in the last 72 hours.  Coagulation profile No results for input(s): INR, PROTIME in the last 168 hours.  No results for input(s): DDIMER in the last 72 hours.  Cardiac Enzymes  Recent Labs Lab 06/13/15 0322 06/13/15 0916  TROPONINI 0.03 0.03   ------------------------------------------------------------------------------------------------------------------ Invalid input(s): POCBNP   Recent Labs  06/13/15 0306 06/13/15 0631 06/13/15 1123  GLUCAP 127* 74 90     Bela Nyborg  M.D. [facility] 06/13/2015, 1:53 PM  Pager: [phone] Between 7am to 7pm - call Pager - [phone]  After 7pm go to [email or web address] - password [email or web address]  Call night coverage person covering after 7pm

## 2015-06-13 NOTE — ED Notes (Signed)
Pt c/o shortness of breath with fluid retention. Was seen at [facility] last night and was told to increase lasix dose to 40mg  twice a day. Pt was able to take 40mg  this morning, did not take dose this afternoon. At baseline, pt wears 2L oxygen at home. Pt was assisted from a wheelchair to bed, became more short of breath and labored, sats at 92-93% after sitting down. Pt sats improved after resting.

## 2015-06-13 NOTE — Progress Notes (Signed)
RT NOTE:  RT assisted with setup of home CPAP. 2L O2 bled in. Humidity chamber filled.

## 2015-06-13 NOTE — ED Notes (Signed)
Pt remains labored, able to speak in small sentences, requesting foley catheter for diuresis.

## 2015-06-14 ENCOUNTER — Inpatient Hospital Stay (HOSPITAL_COMMUNITY): Payer: BLUE CROSS/BLUE SHIELD

## 2015-06-14 DIAGNOSIS — I482 Chronic atrial fibrillation: Secondary | ICD-10-CM

## 2015-06-14 DIAGNOSIS — N183 Chronic kidney disease, stage 3 (moderate): Secondary | ICD-10-CM

## 2015-06-14 DIAGNOSIS — R0600 Dyspnea, unspecified: Secondary | ICD-10-CM

## 2015-06-14 DIAGNOSIS — I5033 Acute on chronic diastolic (congestive) heart failure: Secondary | ICD-10-CM

## 2015-06-14 LAB — CBC
HCT: 28.5 % — ABNORMAL LOW (ref 36.0–46.0)
HEMOGLOBIN: 8.6 g/dL — AB (ref 12.0–15.0)
MCH: 28.7 pg (ref 26.0–34.0)
MCHC: 30.2 g/dL (ref 30.0–36.0)
MCV: 95 fL (ref 78.0–100.0)
PLATELETS: 121 10*3/uL — AB (ref 150–400)
RBC: 3 MIL/uL — AB (ref 3.87–5.11)
RDW: 18 % — ABNORMAL HIGH (ref 11.5–15.5)
WBC: 5.4 10*3/uL (ref 4.0–10.5)

## 2015-06-14 LAB — IRON AND TIBC
IRON: 35 ug/dL (ref 28–170)
SATURATION RATIOS: 12 % (ref 10.4–31.8)
TIBC: 287 ug/dL (ref 250–450)
UIBC: 252 ug/dL

## 2015-06-14 LAB — GLUCOSE, CAPILLARY
GLUCOSE-CAPILLARY: 94 mg/dL (ref 65–99)
GLUCOSE-CAPILLARY: 113 mg/dL — AB (ref 65–99)
Glucose-Capillary: 124 mg/dL — ABNORMAL HIGH (ref 65–99)
Glucose-Capillary: 139 mg/dL — ABNORMAL HIGH (ref 65–99)

## 2015-06-14 LAB — BASIC METABOLIC PANEL
ANION GAP: 8 (ref 5–15)
BUN: 45 mg/dL — ABNORMAL HIGH (ref 6–20)
CALCIUM: 9.2 mg/dL (ref 8.9–10.3)
CHLORIDE: 108 mmol/L (ref 101–111)
CO2: 25 mmol/L (ref 22–32)
CREATININE: 1.93 mg/dL — AB (ref 0.44–1.00)
GFR calc non Af Amer: 26 mL/min — ABNORMAL LOW (ref >60)
GFR, EST AFRICAN AMERICAN: 30 mL/min — AB (ref >60)
Glucose, Bld: 107 mg/dL — ABNORMAL HIGH (ref 65–99)
Potassium: 4.7 mmol/L (ref 3.5–5.1)
SODIUM: 141 mmol/L (ref 135–145)

## 2015-06-14 LAB — RETICULOCYTES
RBC.: 3 MIL/uL — AB (ref 3.87–5.11)
RETIC COUNT ABSOLUTE: 54 10*3/uL (ref 19.0–186.0)
RETIC CT PCT: 1.8 % (ref 0.4–3.1)

## 2015-06-14 LAB — VITAMIN B12: VITAMIN B 12: 766 pg/mL (ref 180–914)

## 2015-06-14 LAB — FERRITIN: Ferritin: 390 ng/mL — ABNORMAL HIGH (ref 11–307)

## 2015-06-14 LAB — FOLATE: Folate: 23.2 ng/mL (ref >5.9)

## 2015-06-14 MED ORDER — FUROSEMIDE 10 MG/ML IJ SOLN
80.0000 mg | Freq: Three times a day (TID) | INTRAMUSCULAR | Status: DC
Start: 2015-06-14 — End: 2015-06-14

## 2015-06-14 MED ORDER — PERFLUTREN LIPID MICROSPHERE
INTRAVENOUS | Status: AC
  Administered 2015-06-14: 3 mL
  Filled 2015-06-14: qty 10

## 2015-06-14 MED ORDER — METOLAZONE 2.5 MG PO TABS
2.5000 mg | ORAL_TABLET | Freq: Once | ORAL | Status: AC
  Administered 2015-06-14: 2.5 mg via ORAL
  Filled 2015-06-14: qty 1

## 2015-06-14 MED ORDER — FUROSEMIDE 10 MG/ML IJ SOLN
12.0000 mg/h | INTRAVENOUS | Status: DC
  Administered 2015-06-14 – 2015-06-17 (×4): 12 mg/h via INTRAVENOUS
  Administered 2015-06-18 – 2015-06-21 (×4): 10 mg/h via INTRAVENOUS
  Filled 2015-06-14 (×21): qty 25

## 2015-06-14 MED ORDER — FUROSEMIDE 10 MG/ML IJ SOLN: 40.0000 mg | Freq: Three times a day (TID) | INTRAMUSCULAR | Status: DC

## 2015-06-14 MED ORDER — PERFLUTREN LIPID MICROSPHERE
1.0000 mL | INTRAVENOUS | Status: AC | PRN
  Filled 2015-06-14: qty 10

## 2015-06-14 MED ORDER — IPRATROPIUM-ALBUTEROL 0.5-2.5 (3) MG/3ML IN SOLN
3.0000 mL | Freq: Three times a day (TID) | RESPIRATORY_TRACT | Status: DC
  Administered 2015-06-14 – 2015-06-21 (×22): 3 mL via RESPIRATORY_TRACT
  Filled 2015-06-14 (×22): qty 3

## 2015-06-14 NOTE — Progress Notes (Signed)
  Echocardiogram 2D Echocardiogram with Definity has been performed.  Tresa Res 06/14/2015, 11:45 AM

## 2015-06-14 NOTE — Progress Notes (Signed)
Heart Failure Navigator Consult Note  Presentation: Robyn Porter is a Robyn Porter is a 65 y.o. female with history of diastolic CHF last EF measured in November 2016 was 50-55% presents to the ER because of increasing shortness of breath and increased weight. Patient states that over the last 1 month patient has been noticing increasing shortness of breath both on sitting and exertion. Denies any chest pain. Patient's Lasix was recently increased to 20 mg twice a day. Patient states she has been to the ER at least twice over the last 1 month. Patient was admitted at [facility] in November for Streptococcus bacteremia secondary to cellulitis at that time patient had 2-D echo and transesophageal echocardiogram which showed EF of 50-55%. At this time chest x-ray shows congestion and patient has been placed on Lasix and admitted for CHF. Patient denies any nausea vomiting abdominal pain or diarrhea.   Past Medical History  Diagnosis Date  . COPD (chronic obstructive pulmonary disease) ([place])   . Hypertension   . Diabetes mellitus without complication ([place])   . Afib ([place])   . Pacemaker     Social History   Social History  . Marital Status: Married    Spouse Name: N/A  . Number of Children: N/A  . Years of Education: N/A   Social History Main Topics  . Smoking status: Passive Smoke Exposure - Never Smoker  . Smokeless tobacco: Never Used  . Alcohol Use: No  . Drug Use: No  . Sexual Activity: Not Asked   Other Topics Concern  . None   Social History Narrative    ECHO:  Pending   BNP    Component Value Date/Time   BNP 940.9* 06/12/2015 2328    ProBNP No results found for: PROBNP   Education Assessment and Provision:  Detailed education and instructions provided on heart failure disease management including the following:  Signs and symptoms of Heart Failure When to call the physician Importance of daily weights Low sodium diet Fluid  restriction Medication management Anticipated future follow-up appointments  Patient education given on each of the above topics.  Patient acknowledges understanding and acceptance of all instructions.  I spoke with [name] regarding her HF.  She tells me that she has received some HF education before.  She can articulate the importance of daily weights however says that her scale is not capable to support her current weight.  She tells me that she weighed 383 lbs after Rehab in December and current weight is 423 lbs.  I reinforced a low sodium diet and asked how she is eating at home.  She tells me that she is adding "Low Salt" product to her food.  I asked her to avoid all products that contain the word Salt.  I also reviewed high sodium foods for her to avoid.  She admits that she sometimes eats canned soups and hot dogs and I strongly discouraged these foods.   She follows with Cardiologist in [place] yet describes issues with call back when she tries to contact office about weight gains or worsening HF symptoms.  She may attempt to change physician-however is unsure at this time.    Education Materials:  "Living Better With Heart Failure" Booklet, Daily Weight Tracker Tool    High Risk Criteria for Readmission and/or Poor Patient Outcomes:   EF <30%- New pending  2 or more admissions in 6 months- Yes here and [place]   Difficult social situation- No -lives with nephew and  his wife  Demonstrates medication noncompliance- Denies currently -still has insurance through her work--? However may need to stop working due to her health    Barriers of Care:  Knowledge and compliance   Discharge Planning:   Plans to return home if physically able to [place] with nephew and his wife.  She has HHRN currently and will need to continue if discharges to home for ongoing education, compliance reinforcement and symptom recognition.

## 2015-06-14 NOTE — Evaluation (Signed)
Physical Therapy Evaluation Patient Details Name: Robyn Porter MRN: [id number] DOB: [birth year] Today's Date: 06/14/2015   History of Present Illness  Robyn Porter is a 65 y.o. female with history of diastolic CHF last EF measured in November 2016 was 50-55% presents to the ER because of increasing shortness of breath and increased weight. Patient states that over the last 1 month patient has been noticing increasing shortness of breath both on sitting and exertion. Denies any chest pain  Clinical Impression  Very pleasant patient with the PT deficits listed below, severely limited by cardio-pulmonary status. On 2/3L O2 t/o session with HR increasing to 130s with minimal activity. Pt is greatly deconditioned however able to perform bed mobility and transfers with Min A-Min Guard. Pt had recently returned home from SNF in December, where she was walking longer distances and has since become sick and less mobile. Pt would benefit from skilled acute therapy while here and recommending SNF at D/C as pt is highly motivated and made great gains before.     Follow Up Recommendations SNF;Supervision - Intermittent    Equipment Recommendations  None recommended by PT    Recommendations for Other Services       Precautions / Restrictions Precautions Precautions: Fall;ICD/Pacemaker Precaution Comments: 400+ lbs, monitor O2 Restrictions Weight Bearing Restrictions: No      Mobility  Bed Mobility Overal bed mobility: Needs Assistance Bed Mobility: Supine to Sit     Supine to sit: Supervision;HOB elevated     General bed mobility comments: strong reliance on bed rails and momentum to achieve sitting EOB, physical assist to initally steady pt on EOB  Transfers Overall transfer level: Needs assistance Equipment used: Rolling walker (2 wheeled) Transfers: Sit to/from [facility] Sit to Stand: Min assist;Min guard Stand pivot transfers: Min guard;Min assist        General transfer comment: cueing for hip/feet placement, Min A for initial boost, rocking to gain momentum, Min A for initial stabilization, HR increased to 132, VCs for breathing techniques  Ambulation/Gait                Stairs            Wheelchair Mobility    Modified Rankin (Stroke Patients Only)       Balance Overall balance assessment: Needs assistance   Sitting balance-Leahy Scale: Fair       Standing balance-Leahy Scale: Fair                               Pertinent Vitals/Pain Pain Assessment: No/denies pain    Home Living Family/patient expects to be discharged to:: Private residence Living Arrangements: Other relatives Available Help at Discharge: Family;Available PRN/intermittently Type of Home: House Home Access: Level entry     Home Layout: One level Home Equipment: Walker - 2 wheels;Walker - 4 wheels;Transport chair      Prior Function Level of Independence: Needs assistance   Gait / Transfers Assistance Needed: walks short distances with AD  ADL's / Homemaking Assistance Needed: can microwave food, sometimes wears depends, nephew and his wife do some cooking  Comments: on home O2     Hand Dominance        Extremity/Trunk Assessment   Upper Extremity Assessment: Generalized weakness           Lower Extremity Assessment: Generalized weakness (history of diabetic neuropathy, limited ROM due to weight)      Cervical / Trunk  Assessment: Normal  Communication   Communication: No difficulties  Cognition Arousal/Alertness: Awake/alert Behavior During Therapy: WFL for tasks assessed/performed Overall Cognitive Status: Within Functional Limits for tasks assessed                      General Comments General comments (skin integrity, edema, etc.): O2 98% on 3L bumped down to 2L and O2 stats remained in mid 90s, HR increased to 131 with minimal activity    Exercises        Assessment/Plan    PT  Assessment Patient needs continued PT services  PT Diagnosis Difficulty walking;Generalized weakness   PT Problem List Decreased range of motion;Decreased activity tolerance;Decreased balance;Decreased mobility;Cardiopulmonary status limiting activity;Obesity  PT Treatment Interventions Gait training;DME instruction;Functional mobility training;Therapeutic activities;Therapeutic exercise;Balance training;Patient/family education   PT Goals (Current goals can be found in the Care Plan section) Acute Rehab PT Goals Patient Stated Goal: get better PT Goal Formulation: With patient Time For Goal Achievement: 06/27/15 Potential to Achieve Goals: Fair    Frequency Min 2X/week   Barriers to discharge Decreased caregiver support      Co-evaluation               End of Session Equipment Utilized During Treatment: Gait belt;Oxygen Activity Tolerance: Patient limited by fatigue Patient left: in chair;with call bell/phone within reach Nurse Communication: Mobility status         Time: [id number] PT Time Calculation (min) (ACUTE ONLY): 36 min   Charges:   PT Evaluation $PT Eval Moderate Complexity: 1 Procedure PT Treatments $Neuromuscular Re-education: 8-22 mins   PT G Codes:        Ara Kussmaul June 25, 2015, 5:52 PM  Ara Kussmaul, Student Physical Therapist Acute Rehab [phone]

## 2015-06-14 NOTE — Progress Notes (Signed)
[facility]                                                                              Patient Demographics  Robyn Porter, is a 65 y.o. female, DOB - [birth year], [id number]  Admit date - 06/12/2015   Admitting Physician [name], MD  Outpatient Primary MD for the patient is ADDIS,DANIEL, DO  LOS - 1   Chief Complaint  Patient presents with  . Shortness of Breath       Brief HPI    Robyn Porter is a 65 y.o. female with history of diastolic CHF last EF measured in November 2016 was 50-55% presented to the ER because of increasing shortness of breath and increased weight. Patient states that over the last 1 month patient has been noticing increasing shortness of breath both on sitting and exertion. Denied any chest pain. Patient's Lasix was recently increased to 20 mg twice a day. Patient reported that she had been to the [facility] ER at least twice over the last 1 month. Patient was admitted at [facility] in November for Streptococcus bacteremia secondary to cellulitis at that time patient had 2-D echo and transesophageal echocardiogram which showed EF of 50-55%. At this time chest x-ray shows congestion and patient has been placed on Lasix and admitted for CHF.    Assessment & Plan    Principal Problem:   Acute on chronic diastolic CHF (congestive heart failure), NYHA class 1 ([place]): BNP 940 - Discussed in detail with the patient, she reported that she was 383 lbs on 12/18 when she was discharged from [facility]. Currently 419lbs from 423 lbs, massively volume overloaded.   - increased IV Lasix however she needs cardiology team follow-up and aggressive diuresis.  - called cardiology consult, creatinine 1.9, stable from yesterday  - Follow 2-D echo,  - Strict I's and O's and daily weights   Active Problems: Acute bronchitis/COPD- improving today  - Placed on DuoNeb's 4 times a day, Symbicort, prednisone  ? UTI -  Currently no symptoms of dysuria, fevers or leukocytosis - Hold off on antibiotics, follow cultures closely - Patient reported that she was admitted in [facility] last month and had C. difficile positive     Chronic atrial fibrillation ([place]) - Rate controlled, continue metoprolol - [place] vasc score 4, continue xarelto    Diabetes mellitus type 2 in obese ([place]) - Placed on sliding scale insulin, follow CBGs    History of pacemaker    Chronic anemia - anemia panel consistent with anemia of chronic disease     OSA (obstructive sleep apnea) Continue CPAP   ? Chronic kidney disease  - Baseline not available   Code Status: Full CODE STATUS   Family Communication: Discussed in detail with the patient, all imaging results, lab results explained to the patient   Disposition Plan:   Time Spent in minutes   25 minutes  Procedures  None   Consult cardiology  DVT Prophylaxis  rivaroxaban  Medications  Scheduled Meds: . allopurinol  100 mg Oral Daily  . atorvastatin  20 mg Oral Daily  . budesonide-formoterol  2  puff Inhalation BID  . cholecalciferol  5,000 Units Oral Daily  . ferrous sulfate  650 mg Oral BID WC  . insulin aspart  0-9 Units Subcutaneous TID WC  . ipratropium-albuterol  3 mL Nebulization TID  . loratadine  10 mg Oral Daily  . losartan  50 mg Oral Daily  . metoprolol  50 mg Oral BID  . multivitamin with minerals  1 tablet Oral Daily  . predniSONE  40 mg Oral Q breakfast  . rivaroxaban  20 mg Oral Q supper  . sodium chloride flush  3 mL Intravenous Q12H  . cyanocobalamin  500 mcg Oral Daily  . vitamin C  500 mg Oral Daily   Continuous Infusions: . furosemide (LASIX) infusion     PRN Meds:.acetaminophen **OR** acetaminophen, albuterol, benzonatate, ondansetron **OR** ondansetron (ZOFRAN) IV, perflutren lipid microspheres (DEFINITY) IV suspension   Antibiotics   Anti-infectives    Start     Dose/Rate Route Frequency Ordered Stop   06/13/15 0915   cefTRIAXone (ROCEPHIN) 1 g in dextrose 5 % 50 mL IVPB  Status:  Discontinued     1 g 100 mL/hr over 30 Minutes Intravenous Every 24 hours 06/13/15 0912 06/13/15 0915        Subjective:   Robyn Porter was seen and examined today.  still significantly volume overloaded, no chest pain. No wheezing. No fevers or chills. Coughing is improving. Still having shortness of breath. Patient denies abdominal pain, N/V/D/C, new weakness, numbess, tingling. No acute events overnight.    Objective:   Filed Vitals:   06/13/15 2140 06/13/15 2338 06/14/15 0447 06/14/15 0935  BP:  124/52 129/60   Pulse:  60 71   Temp:   97 F (36.1 C)   TempSrc:   Oral   Resp:  18 18   Height:      Weight:   190.103 kg (419 lb 1.6 oz)   SpO2: 98% 96% 98% 97%    Intake/Output Summary (Last 24 hours) at 06/14/15 1220 Last data filed at 06/14/15 1143  Gross per 24 hour  Intake    865 ml  Output   1650 ml  Net   -785 ml     Wt Readings from Last 3 Encounters:  06/14/15 190.103 kg (419 lb 1.6 oz)     Exam  General: Alert and oriented x 3, NAD,   HEENT:  PERRLA, EOMI, Anicteric Sclera, mucous membranes moist.   Neck: Supple, +JVD, no masses  CVS: S1 S2 auscultated, no rubs, murmurs or gallops. Regular rate and rhythm.  Respiratory: Bibasilar crackles   Abdomen :  morbidly obese Soft, nontender, distended, + bowel sounds  Ext: no cyanosis clubbing, 3+ edema  Neuro: no new deficit   Skin: No rashes  Psych: Normal affect and demeanor, alert and oriented x3    Data Review   Micro Results No results found for this or any previous visit (from the past 240 hour(s)).  Radiology Reports Dg Chest 2 View  06/12/2015  CLINICAL DATA:  65 year old female with shortness of breath and cough for the past month EXAM: CHEST  2 VIEW COMPARISON:  None. FINDINGS: Left subclavian approach single lead cardiac rhythm maintenance device with the lead projected over the right ventricle. Cardiomegaly. Pulmonary  vascular congestion with mild interstitial edema. Small bilateral pleural effusions with associated bibasilar atelectasis. No acute osseous abnormality. IMPRESSION: Mild-moderate CHF. Electronically Signed   By: Jacqulynn Cadet M.D.   On: 06/12/2015 20:23    CBC  Recent Labs Lab  06/12/15 1938 06/13/15 0322 06/14/15 0540  WBC 7.9 9.4 5.4  HGB 9.4* 9.1* 8.6*  HCT 31.1* 30.2* 28.5*  PLT 139* 152 121*  MCV 94.2 94.7 95.0  MCH 28.5 28.5 28.7  MCHC 30.2 30.1 30.2  RDW 18.0* 17.9* 18.0*  LYMPHSABS  --  1.1  --   MONOABS  --  0.7  --   EOSABS  --  0.0  --   BASOSABS  --  0.0  --     Chemistries   Recent Labs Lab 06/12/15 1938 06/13/15 0916 06/14/15 0540  NA 142 143 141  K 5.0 4.9 4.7  CL 107 110 108  CO2 22 24 25   GLUCOSE 117* 130* 107*  BUN 38* 42* 45*  CREATININE 1.86* 1.96* 1.93*  CALCIUM 9.6 9.3 9.2   ------------------------------------------------------------------------------------------------------------------ estimated creatinine clearance is 49.3 mL/min (by C-G formula based on Cr of 1.93). ------------------------------------------------------------------------------------------------------------------ No results for input(s): HGBA1C in the last 72 hours. ------------------------------------------------------------------------------------------------------------------ No results for input(s): CHOL, HDL, LDLCALC, TRIG, CHOLHDL, LDLDIRECT in the last 72 hours. ------------------------------------------------------------------------------------------------------------------ No results for input(s): TSH, T4TOTAL, T3FREE, THYROIDAB in the last 72 hours.  Invalid input(s): FREET3 ------------------------------------------------------------------------------------------------------------------  Recent Labs  06/14/15 0540  VITAMINB12 766  FOLATE 23.2  FERRITIN 390*  TIBC 287  IRON 35  RETICCTPCT 1.8    Coagulation profile No results for input(s): INR,  PROTIME in the last 168 hours.  No results for input(s): DDIMER in the last 72 hours.  Cardiac Enzymes  Recent Labs Lab 06/13/15 0322 06/13/15 0916 06/13/15 1457  TROPONINI 0.03 0.03 0.03   ------------------------------------------------------------------------------------------------------------------ Invalid input(s): POCBNP   Recent Labs  06/13/15 0631 06/13/15 1123 06/13/15 1649 06/13/15 2043 06/14/15 0547 06/14/15 1107  GLUCAP 97 90 118* 127* 71 113*     Arionna Hoggard M.D. [facility] 06/14/2015, 12:20 PM  Pager: [phone] Between 7am to 7pm - call Pager - [phone]  After 7pm go to [email or web address] - password [email or web address]  Call night coverage person covering after 7pm

## 2015-06-14 NOTE — Consult Note (Signed)
Advanced Heart Failure Team Consult Note  Referring Physician: Dr Tana Coast Primary Physician: Follows up at [facility] Primary Cardiologist: Dr Alroy Dust in [place]  Reason for Consultation: A/C diastolic HF.  HPI:    Robyn Porter is a 65 y.o. female with history of diastolic CHF LVEF [id number] in [place] 02/2015, COPD, and Asthma who presented to [facility] 06/12/15 with worsening SOB for several days. States she has gained 40 lbs in just over 2 months. She was seen @ [place] ED 06/11/15 for same reason, given IV lasix and steroids and sent home. HHRN recommended she return to ED for further treatment.  Pertinent labs on admission include K 5.0, Cr 1.86, BNP 940.9, flat tropononins, Hgb 9.4, UA cloudy + nitrites, UCx pending, WBC 5.4. CXR with cardiomegaly, pulmonary vascular congestion with mild interstitial edema, small bilateral pleural effusions with associated bibasilar atelectasis.   She states she weight 383 lbs after rehab in December, but current weight at home was 423 lbs.  States she understand importance of daily weights, but scale will not support her currently. She spoke with HF navigator RN. Adds "Low-salt" seasoning and eats canned soups and hot dogs semi-regularly. States she has been building up fluid since she got out of rehab.  Was only taking lasix 20 mg daily at home until recently, when she increased to 40 mg daily.  Noticed a difference, seemed to be more. In December could walk down a hallway and back without SOB, now has DOE with minimal exertion. She had been working full time up until her admission in November.  Has 6 months sick time saved up, and is also eligible for retirement.  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 States she has been trying to get follow up with her cardiologist for sometime but calls are not returned, HHRN unable to get through either. She sleeps with CPAP nightly.   Review of Systems: [y] = yes, [ ]  = no   General: Weight  gain [y]; Weight loss [ ] ; Anorexia [ ] ; Fatigue [ ] ; Fever [ ] ; Chills [ ] ; Weakness [ ]   Cardiac: Chest pain/pressure [ ] ; Resting SOB [ ] ; Exertional SOB [y]; Orthopnea [y]; Pedal Edema [y]; Palpitations [ ] ; Syncope [ ] ; Presyncope [ ] ; Paroxysmal nocturnal dyspnea[ ]   Pulmonary: Cough [ ] ; Wheezing[ ] ; Hemoptysis[ ] ; Sputum [ ] ; Snoring [y]  GI: Vomiting[ ] ; Dysphagia[ ] ; Melena[ ] ; Hematochezia [ ] ; Heartburn[ ] ; Abdominal pain [ ] ; Constipation [ ] ; Diarrhea [ ] ; BRBPR [ ]   GU: Hematuria[ ] ; Dysuria [ ] ; Nocturia[ ]   Vascular: Pain in legs with walking [ ] ; Pain in feet with lying flat [ ] ; Non-healing sores [ ] ; Stroke [ ] ; TIA [ ] ; Slurred speech [ ] ;  Neuro: Headaches[ ] ; Vertigo[ ] ; Seizures[ ] ; Paresthesias[ ] ;Blurred vision [ ] ; Diplopia [ ] ; Vision changes [ ]   Ortho/Skin: Arthritis [y]; Joint pain [y]; Muscle pain [ ] ; Joint swelling [ ] ; Back Pain [ ] ; Rash [ ]   Psych: Depression[ ] ; Anxiety[ ]   Heme: Bleeding problems [ ] ; Clotting disorders [ ] ; Anemia [ ]   Endocrine: Diabetes [ ] ; Thyroid dysfunction[ ]   Home Medications Prior to Admission medications   Medication Sig Start Date End Date Taking? Authorizing Provider  albuterol (PROVENTIL HFA;VENTOLIN HFA) 108 (90 Base) MCG/ACT inhaler Inhale 1 puff into the lungs every 6 (six) hours as needed for wheezing or shortness of breath.   Yes Historical Provider, MD  albuterol (PROVENTIL) (2.5 MG/3ML) 0.083% nebulizer solution  Take 2.5 mg by nebulization every 8 (eight) hours as needed for wheezing.  05/22/15  Yes Historical Provider, MD  allopurinol (ZYLOPRIM) 100 MG tablet Take 100 mg by mouth daily.   Yes Historical Provider, MD  atorvastatin (LIPITOR) 20 MG tablet Take 20 mg by mouth daily.   Yes Historical Provider, MD  benzonatate (TESSALON) 200 MG capsule Take 200 mg by mouth 3 (three) times daily as needed for cough.   Yes Historical Provider, MD  budesonide-formoterol (SYMBICORT) 80-4.5 MCG/ACT inhaler Inhale 2 puffs into the  lungs 2 (two) times daily.   Yes Historical Provider, MD  BYDUREON 2 MG PEN Inject 2 mg as directed every 7 (seven) days. On Saturday 05/29/15  Yes Historical Provider, MD  Cholecalciferol (VITAMIN D3) 5000 units CAPS Take 1 capsule by mouth daily.   Yes Historical Provider, MD  cyanocobalamin 500 MCG tablet Take 500 mcg by mouth daily.   Yes Historical Provider, MD  ferrous sulfate 325 (65 FE) MG tablet Take 650 mg by mouth 2 (two) times daily with a meal.   Yes Historical Provider, MD  furosemide (LASIX) 20 MG tablet Take 20 mg by mouth daily as needed for fluid.   Yes Historical Provider, MD  loratadine (CLARITIN) 10 MG tablet Take 10 mg by mouth daily.   Yes Historical Provider, MD  losartan (COZAAR) 50 MG tablet Take 50 mg by mouth daily.   Yes Historical Provider, MD  metoprolol (LOPRESSOR) 50 MG tablet Take 50 mg by mouth 2 (two) times daily.   Yes Historical Provider, MD  Multiple Vitamin (MULTIVITAMIN WITH MINERALS) TABS tablet Take 1 tablet by mouth daily.   Yes Historical Provider, MD  rivaroxaban (XARELTO) 20 MG TABS tablet Take 20 mg by mouth daily with supper.   Yes Historical Provider, MD  vitamin C (ASCORBIC ACID) 500 MG tablet Take 500 mg by mouth daily.   Yes Historical Provider, MD    Past Medical History: Past Medical History  Diagnosis Date  . COPD (chronic obstructive pulmonary disease) ([place])   . Hypertension   . Diabetes mellitus without complication ([place])   . Afib ([place])   . Pacemaker     Past Surgical History: Past Surgical History  Procedure Laterality Date  . Pacemaker insertion    . Abdominal hysterectomy      Family History: Family History  Problem Relation Age of Onset  . Angina Sister   . Emphysema Sister   . Colon cancer Brother     Social History: Social History   Social History  . Marital Status: Married    Spouse Name: N/A  . Number of Children: N/A  . Years of Education: N/A   Social History Main Topics  . Smoking status: Passive  Smoke Exposure - Never Smoker  . Smokeless tobacco: Never Used  . Alcohol Use: No  . Drug Use: No  . Sexual Activity: Not Asked   Other Topics Concern  . None   Social History Narrative    Allergies:  Allergies  Allergen Reactions  . Amoxicillin Hives  . Contrast Media [Iodinated Diagnostic Agents] Hives    Objective:    Vital Signs:   Temp:  [97 F (36.1 C)-97.5 F (36.4 C)] 97 F (36.1 C) (02/16 0447) Pulse Rate:  [60-91] 71 (02/16 0447) Resp:  [18-20] 18 (02/16 0447) BP: (97-129)/(47-60) 129/60 mmHg (02/16 0447) SpO2:  [96 %-99 %] 97 % (02/16 0935) Weight:  [419 lb 1.6 oz (190.103 kg)] 419 lb 1.6 oz (190.103 kg) (02/16  0447) Last BM Date: 06/12/15  Weight change: Filed Weights   06/12/15 1915 06/13/15 0301 06/14/15 0447  Weight: 423 lb 11.2 oz (192.189 kg) 424 lb 3.2 oz (192.416 kg) 419 lb 1.6 oz (190.103 kg)    Intake/Output:   Intake/Output Summary (Last 24 hours) at 06/14/15 1038 Last data filed at 06/14/15 0824  Gross per 24 hour  Intake    690 ml  Output   1650 ml  Net   -960 ml     Physical Exam: General:  Morbidly obese. No resp difficulty HEENT: normal, dyed hair Neck: supple. JVP to ear . Carotids 2+ bilat; no bruits. No thyromegaly or nodule noted Cor: PMI nondisplaced. RRR. No M/G/R appreciated, distant heart sounds Lungs: Dependent crackles, distant Abdomen: Morbidly obese, pitting edema up to mid abdomen. +BS Extremities: no cyanosis, clubbing, rash. 3+ chronic edema into abdomen.  Neuro: alert & orientedx3, cranial nerves grossly intact. moves all 4 extremities w/o difficulty. Affect pleasant  Telemetry: Reviewed personally, V-paced 77s, occasional PVCs  Labs: Basic Metabolic Panel:  Recent Labs Lab 06/12/15 1938 06/13/15 0916 06/14/15 0540  NA 142 143 141  K 5.0 4.9 4.7  CL 107 110 108  CO2 22 24 25   GLUCOSE 117* 130* 107*  BUN 38* 42* 45*  CREATININE 1.86* 1.96* 1.93*  CALCIUM 9.6 9.3 9.2    Liver Function Tests: No  results for input(s): AST, ALT, ALKPHOS, BILITOT, PROT, ALBUMIN in the last 168 hours. No results for input(s): LIPASE, AMYLASE in the last 168 hours. No results for input(s): AMMONIA in the last 168 hours.  CBC:  Recent Labs Lab 06/12/15 1938 06/13/15 0322 06/14/15 0540  WBC 7.9 9.4 5.4  NEUTROABS  --  7.6  --   HGB 9.4* 9.1* 8.6*  HCT 31.1* 30.2* 28.5*  MCV 94.2 94.7 95.0  PLT 139* 152 121*    Cardiac Enzymes:  Recent Labs Lab 06/13/15 0322 06/13/15 0916 06/13/15 1457  TROPONINI 0.03 0.03 0.03    BNP: BNP (last 3 results)  Recent Labs  06/12/15 2328  BNP 940.9*    ProBNP (last 3 results) No results for input(s): PROBNP in the last 8760 hours.   CBG:  Recent Labs Lab 06/13/15 0631 06/13/15 1123 06/13/15 1649 06/13/15 2043 06/14/15 0547  GLUCAP 86 90 118* 127* 94    Coagulation Studies: No results for input(s): LABPROT, INR in the last 72 hours.  Other results: EKG: 06/14/15 Vpaced 70s with PVCs  Imaging: Dg Chest 2 View  06/12/2015  CLINICAL DATA:  65 year old female with shortness of breath and cough for the past month EXAM: CHEST  2 VIEW COMPARISON:  None. FINDINGS: Left subclavian approach single lead cardiac rhythm maintenance device with the lead projected over the right ventricle. Cardiomegaly. Pulmonary vascular congestion with mild interstitial edema. Small bilateral pleural effusions with associated bibasilar atelectasis. No acute osseous abnormality. IMPRESSION: Mild-moderate CHF. Electronically Signed   By: Jacqulynn Cadet M.D.   On: 06/12/2015 20:23      Medications:     Current Medications: . allopurinol  100 mg Oral Daily  . atorvastatin  20 mg Oral Daily  . budesonide-formoterol  2 puff Inhalation BID  . cholecalciferol  5,000 Units Oral Daily  . ferrous sulfate  650 mg Oral BID WC  . furosemide  40 mg Intravenous Q8H  . insulin aspart  0-9 Units Subcutaneous TID WC  . ipratropium-albuterol  3 mL Nebulization TID  .  loratadine  10 mg Oral Daily  . losartan  50 mg Oral Daily  . metoprolol  50 mg Oral BID  . multivitamin with minerals  1 tablet Oral Daily  . predniSONE  40 mg Oral Q breakfast  . rivaroxaban  20 mg Oral Q supper  . sodium chloride flush  3 mL Intravenous Q12H  . cyanocobalamin  500 mcg Oral Daily  . vitamin C  500 mg Oral Daily     Infusions:      Assessment/Plan   1. Acute on chronic diastolic CHF  - She is markedly volume overloaded on exam with pitting edema into her thighs and abdomen.  - Change IV lasix to gtt at 12 mg/hr with 2.5 mg metolazone x 1.  - Repeat Echo pending.  - Reviewed fluid and sodium restriction and importance of medical compliance. - Consider PICC to better assess fluid status.  No Stepdown beds currently.  Will see how she diureses today.  2. S/p Medtronic PPM - She states she has this for history of syncopal episodes, sounds like symptomatic bradycardia.  - Will have device interrogated.  3. Chronic Afib - Rate controlled on toprol.  - Continue Xarelto - This patients CHA2DS2-VASc Score and unadjusted Ischemic Stroke Rate (% per year) is at least 4.8 % stroke rate/year from a score of 4 4. COPD - Stable. Per primary - Not a former smoker, long exposure to second hand. Not on chronic 02.  5. OSA - Continue CPAP.  6. CKD stage III - Follow closely with diuresis. May be limiting.   Length of Stay: 1  Shirley Friar PA-C 06/14/2015, 10:38 AM   Patient seen with PA, agree with the above note.  1. Acute on chronic diastolic CHF: Marked volume overload.  Needs echo.  Agree with Lasix gtt + metolazone.  Diuresis may be limited by renal dysfunction.  May need PICC for CVP monitoring to better assess response to diuresis as exam is difficult.  2. CKD: Stage III.  Creatinine 1.8-1.9.  Monitor closely with diuresis.  This may be limiting.  I do not have prior records, but sounds like she has had CKD long-term.  3. Atrial fibrillation: Chronic,  continue Xarelto.  4. Sick sinus syndrome: Has Medtronic PPM.  Will check while she's here.  5. OSA/OHS: Uses CPAP at night and oxygen during the day at home.  Does not have OHS as formal diagnosis but strongly suspect.  6. COPD: Given this diagnosis but never smoked.  Problem may be more OHS.   [name] 06/14/2015 1:08 PM   Advanced Heart Failure Team Pager [phone] (M-F; 7a - 4p)  Please contact [facility] for night-coverage after hours (4p -7a ) and weekends on [email or web address]

## 2015-06-15 ENCOUNTER — Inpatient Hospital Stay (HOSPITAL_COMMUNITY): Payer: BLUE CROSS/BLUE SHIELD

## 2015-06-15 DIAGNOSIS — I5023 Acute on chronic systolic (congestive) heart failure: Secondary | ICD-10-CM

## 2015-06-15 LAB — GLUCOSE, CAPILLARY
GLUCOSE-CAPILLARY: 156 mg/dL — AB (ref 65–99)
GLUCOSE-CAPILLARY: 109 mg/dL — AB (ref 65–99)
GLUCOSE-CAPILLARY: 169 mg/dL — AB (ref 65–99)
Glucose-Capillary: 96 mg/dL (ref 65–99)

## 2015-06-15 LAB — URINE CULTURE

## 2015-06-15 LAB — BASIC METABOLIC PANEL
ANION GAP: 10 (ref 5–15)
BUN: 49 mg/dL — ABNORMAL HIGH (ref 6–20)
CO2: 25 mmol/L (ref 22–32)
Calcium: 9.6 mg/dL (ref 8.9–10.3)
Chloride: 107 mmol/L (ref 101–111)
Creatinine, Ser: 1.87 mg/dL — ABNORMAL HIGH (ref 0.44–1.00)
GFR calc Af Amer: 31 mL/min — ABNORMAL LOW (ref >60)
GFR calc non Af Amer: 27 mL/min — ABNORMAL LOW (ref >60)
GLUCOSE: 113 mg/dL — AB (ref 65–99)
POTASSIUM: 4.6 mmol/L (ref 3.5–5.1)
Sodium: 142 mmol/L (ref 135–145)

## 2015-06-15 LAB — CBC
HEMATOCRIT: 28.1 % — AB (ref 36.0–46.0)
HEMOGLOBIN: 8.7 g/dL — AB (ref 12.0–15.0)
MCH: 29.4 pg (ref 26.0–34.0)
MCHC: 31 g/dL (ref 30.0–36.0)
MCV: 94.9 fL (ref 78.0–100.0)
Platelets: 118 10*3/uL — ABNORMAL LOW (ref 150–400)
RBC: 2.96 MIL/uL — ABNORMAL LOW (ref 3.87–5.11)
RDW: 17.8 % — ABNORMAL HIGH (ref 11.5–15.5)
WBC: 4.9 10*3/uL (ref 4.0–10.5)

## 2015-06-15 LAB — MAGNESIUM: Magnesium: 1.9 mg/dL (ref 1.7–2.4)

## 2015-06-15 MED ORDER — SACCHAROMYCES BOULARDII 250 MG PO CAPS
250.0000 mg | ORAL_CAPSULE | Freq: Two times a day (BID) | ORAL | Status: DC
  Administered 2015-06-15 – 2015-06-30 (×31): 250 mg via ORAL
  Filled 2015-06-15 (×31): qty 1

## 2015-06-15 MED ORDER — METOLAZONE 2.5 MG PO TABS: 2.5000 mg | ORAL_TABLET | Freq: Once | ORAL | Status: DC

## 2015-06-15 MED ORDER — SODIUM CHLORIDE 0.9% FLUSH: 10.0000 mL | INTRAVENOUS | Status: DC | PRN

## 2015-06-15 MED ORDER — CEFTRIAXONE SODIUM 1 G IJ SOLR
1.0000 g | INTRAMUSCULAR | Status: AC
  Administered 2015-06-15 – 2015-06-17 (×3): 1 g via INTRAVENOUS
  Filled 2015-06-15 (×3): qty 10

## 2015-06-15 MED ORDER — METOPROLOL SUCCINATE ER 50 MG PO TB24
50.0000 mg | ORAL_TABLET | Freq: Every day | ORAL | Status: DC
  Administered 2015-06-15 – 2015-06-21 (×7): 50 mg via ORAL
  Filled 2015-06-15 (×8): qty 1

## 2015-06-15 MED ORDER — SODIUM CHLORIDE 0.9% FLUSH
10.0000 mL | Freq: Two times a day (BID) | INTRAVENOUS | Status: DC
  Administered 2015-06-15 – 2015-06-30 (×12): 10 mL

## 2015-06-15 MED ORDER — METOLAZONE 5 MG PO TABS
2.5000 mg | ORAL_TABLET | Freq: Two times a day (BID) | ORAL | Status: DC
  Administered 2015-06-15 – 2015-06-16 (×4): 2.5 mg via ORAL
  Filled 2015-06-15 (×4): qty 1

## 2015-06-15 NOTE — Evaluation (Signed)
Occupational Therapy Evaluation Patient Details Name: Robyn Porter MRN: [id number] DOB: April 19, 1951 Today's Date: 06/15/2015    History of Present Illness Robyn Porter is a 65 y.o. female with history of diastolic CHF last EF measured in November 2016 was 50-55% presents to the ER because of increasing shortness of breath and increased weight. Patient states that over the last 1 month patient has been noticing increasing shortness of breath both on sitting and exertion. Denies any chest pain   Clinical Impression   Patient presenting with decreased ADL and functional mobility independence secondary to above. Patient mod I PTA, prior to initially getting sick. Patient currently functioning at an overall min to total assist level for ADLs and functional mobility. Patient will benefit from acute OT to increase overall independence in the areas of ADLs, functional mobility, and overall safety in order to safely discharge to venue listed below.     Follow Up Recommendations  SNF;Supervision/Assistance - 24 hour    Equipment Recommendations  Other (comment) (TBD)    Recommendations for Other Services  None at this time    Precautions / Restrictions Precautions Precautions: Fall;ICD/Pacemaker Precaution Comments: 400+ lbs, monitor O2 Restrictions Weight Bearing Restrictions: No     Mobility Bed Mobility Overal bed mobility: Needs Assistance Bed Mobility: Supine to Sit     Supine to sit: HOB elevated;Min guard     General bed mobility comments: strong reliance on bed rails and momentum to achieve sitting EOB  Transfers Overall transfer level: Needs assistance Equipment used: Rolling walker (2 wheeled) Transfers: Sit to/from Stand Sit to Stand: Min assist General transfer comment: Min assist for safety, cues for safety.     Balance Overall balance assessment: Needs assistance Sitting-balance support: No upper extremity supported;Feet supported Sitting balance-Leahy  Scale: Fair     Standing balance support: Bilateral upper extremity supported;During functional activity Standing balance-Leahy Scale: Fair    ADL Overall ADL's : Needs assistance/impaired Eating/Feeding: Set up;Sitting   Grooming: Set up;Sitting   Upper Body Bathing: Minimal assitance;Sitting   Lower Body Bathing: Sit to/from stand;Maximal assistance   Upper Body Dressing : Minimal assistance;Sitting   Lower Body Dressing: Total assistance;Sit to/from stand   Toilet Transfer: Minimal assistance;RW;BSC   Toileting- Water quality scientist and Hygiene: Total assistance;Sit to/from Nurse, children's Details (indicate cue type and reason): did not occur   General ADL Comments: Decreased independence due to generalized weakness, decreased o2 support, and body habitus. Patient's sat did remain greater than 90% entire session on 2L/min supplemental 02. Pt reports she just recently purchased a toileting aide that she hasn't had the opportunity to try or use.     Pertinent Vitals/Pain Pain Assessment: No/denies pain     Hand Dominance Right   Extremity/Trunk Assessment Upper Extremity Assessment Upper Extremity Assessment: Generalized weakness   Lower Extremity Assessment Lower Extremity Assessment: Defer to PT evaluation   Cervical / Trunk Assessment Cervical / Trunk Assessment: Normal   Communication Communication Communication: No difficulties   Cognition Arousal/Alertness: Awake/alert Behavior During Therapy: WFL for tasks assessed/performed Overall Cognitive Status: Within Functional Limits for tasks assessed             Home Living Family/patient expects to be discharged to:: Private residence Living Arrangements: Other relatives Available Help at Discharge: Family;Available PRN/intermittently Type of Home: House Home Access: Level entry     Home Layout: One level     Bathroom Shower/Tub: Occupational psychologist: Handicapped  height  Home Equipment: Truman - 2 wheels;Walker - 4 wheels;Transport chair;Shower seat - built in   Prior Functioning/Environment Level of Independence: Needs assistance  Gait / Transfers Assistance Needed: walks short distances with AD ADL's / Homemaking Assistance Needed: can microwave food, sometimes wears depends, nephew and his wife do some cooking; mod I with bathing and dressing before she got sick  Communication / Swallowing Assistance Needed: no difficulties Comments: on home O2    OT Diagnosis: Generalized weakness   OT Problem List: Decreased strength;Decreased activity tolerance;Impaired balance (sitting and/or standing);Decreased safety awareness;Decreased knowledge of use of DME or AE   OT Treatment/Interventions: Self-care/ADL training;Therapeutic exercise;Energy conservation;DME and/or AE instruction;Therapeutic activities;Patient/family education;Balance training    OT Goals(Current goals can be found in the care plan section) Acute Rehab OT Goals Patient Stated Goal: get better to go home soon OT Goal Formulation: With patient Time For Goal Achievement: 06/29/15 Potential to Achieve Goals: Good ADL Goals Pt Will Perform Grooming: standing;with supervision Pt Will Perform Lower Body Bathing: sit to/from stand;with adaptive equipment;with min assist Pt Will Perform Lower Body Dressing: with min assist;with adaptive equipment;sit to/from stand Pt Will Transfer to Toilet: with supervision;ambulating;bedside commode Pt/caregiver will Perform Home Exercise Program: Increased strength;Both right and left upper extremity;With Supervision;With written HEP provided;With theraband Additional ADL Goal #1: Pt will be supervision with functional ambulation using RW  OT Frequency: Min 2X/week   Barriers to D/C: Decreased caregiver support  lives with nephew who works   End of Petros During Treatment: Oxygen;Rolling walker  Activity Tolerance: Patient  tolerated treatment well Patient left: in chair;with call bell/phone within reach   Time: [id number] OT Time Calculation (min): 34 min Charges:  OT General Charges $OT Visit: 1 Procedure OT Evaluation $OT Eval Moderate Complexity: 1 Procedure OT Treatments $Self Care/Home Management : 8-22 mins  Chrys Racer , MS, OTR/L, CLT Pager: [phone]  06/15/2015, 11:28 AM

## 2015-06-15 NOTE — Progress Notes (Addendum)
Advanced Heart Failure Rounding Note  Referring MD: Dr Tana Coast PCP: [facility] Primary Cardiologist: Dr Alroy Dust in [place]  Subjective:    Echo 06/14/15 LVEF 40-45%, Mild AS/moderate AI, Mild MR, PA peak pressure 47 mmHg.  Feels better today.  Breathing improved.  Denies lightheadedness or dizziness. No CP. Chronic orthopnea. Willing to stay as long as it takes to feel better. Peeing a lot.  Urine remains clear.   Out ~ 5L x 24 hrs.  Weight down 6 lbs. Creatinine stable to improved. K stable.   Objective:   Weight Range: 414 lb (187.789 kg) Body mass index is 71.03 kg/(m^2).   Vital Signs:   Temp:  [97 F (36.1 C)-97.9 F (36.6 C)] 97.6 F (36.4 C) (02/17 0752) Pulse Rate:  [50-82] 60 (02/17 0752) Resp:  [16-20] 18 (02/17 0752) BP: (94-126)/(51-61) 112/58 mmHg (02/17 0752) SpO2:  [96 %-100 %] 98 % (02/17 0752) Weight:  [414 lb (187.789 kg)] 414 lb (187.789 kg) (02/17 0446) Last BM Date: 06/14/15  Weight change: Filed Weights   06/13/15 0301 06/14/15 0447 06/15/15 0446  Weight: 424 lb 3.2 oz (192.416 kg) 419 lb 1.6 oz (190.103 kg) 414 lb (187.789 kg)    Intake/Output:   Intake/Output Summary (Last 24 hours) at 06/15/15 0832 Last data filed at 06/15/15 0600  Gross per 24 hour  Intake 1220.4 ml  Output   5750 ml  Net -4529.6 ml     Physical Exam: General: Morbidly obese. No resp difficulty HEENT: normal, dyed hair Neck: supple. JVP difficult to assess, appears to be up to ear. Carotids 2+ bilat; no bruits. No thyromegaly or lymphadenopathy noted Cor: PMI nondisplaced. RRR. 1/6 SEM RUSB, distant heart sounds Lungs: Dependent crackles, distant Abdomen: Morbidly obese, pitting edema up to mid abdomen. +BS Extremities: no cyanosis, clubbing, rash. 3+ chronic edema into abdomen.  Neuro: alert & orientedx3, cranial nerves grossly intact. moves all 4 extremities w/o difficulty. Affect pleasant  Telemetry: Reviewed personally, V-paced 60-70s,  occasional PVCs, Underlying Afib.   Labs: CBC  Recent Labs  06/13/15 0322 06/14/15 0540 06/15/15 0530  WBC 9.4 5.4 4.9  NEUTROABS 7.6  --   --   HGB 9.1* 8.6* 8.7*  HCT 30.2* 28.5* 28.1*  MCV 94.7 95.0 94.9  PLT 152 121* [id number]*   Basic Metabolic Panel  Recent Labs  06/14/15 0540 06/15/15 0530  NA 141 142  K 4.7 4.6  CL 108 107  CO2 25 25  GLUCOSE 107* 113*  BUN 45* 49*  CREATININE 1.93* 1.87*  CALCIUM 9.2 9.6   Liver Function Tests No results for input(s): AST, ALT, ALKPHOS, BILITOT, PROT, ALBUMIN in the last 72 hours. No results for input(s): LIPASE, AMYLASE in the last 72 hours. Cardiac Enzymes  Recent Labs  06/13/15 0322 06/13/15 0916 06/13/15 1457  TROPONINI 0.03 0.03 0.03    BNP: BNP (last 3 results)  Recent Labs  06/12/15 2328  BNP 940.9*    ProBNP (last 3 results) No results for input(s): PROBNP in the last 8760 hours.   D-Dimer No results for input(s): DDIMER in the last 72 hours. Hemoglobin A1C No results for input(s): HGBA1C in the last 72 hours. Fasting Lipid Panel No results for input(s): CHOL, HDL, LDLCALC, TRIG, CHOLHDL, LDLDIRECT in the last 72 hours. Thyroid Function Tests No results for input(s): TSH, T4TOTAL, T3FREE, THYROIDAB in the last 72 hours.  Invalid input(s): FREET3  Other results:     Imaging/Studies:   No results found.  Latest  Echo  Latest Cath   Medications:     Scheduled Medications: . allopurinol  100 mg Oral Daily  . atorvastatin  20 mg Oral Daily  . budesonide-formoterol  2 puff Inhalation BID  . cholecalciferol  5,000 Units Oral Daily  . ferrous sulfate  650 mg Oral BID WC  . insulin aspart  0-9 Units Subcutaneous TID WC  . ipratropium-albuterol  3 mL Nebulization TID  . loratadine  10 mg Oral Daily  . losartan  50 mg Oral Daily  . metoprolol  50 mg Oral BID  . multivitamin with minerals  1 tablet Oral Daily  . rivaroxaban  20 mg Oral Q supper  . sodium chloride flush  3 mL Intravenous  Q12H  . cyanocobalamin  500 mcg Oral Daily  . vitamin C  500 mg Oral Daily     Infusions: . furosemide (LASIX) infusion 12 mg/hr (06/14/15 1408)     PRN Medications:  acetaminophen **OR** acetaminophen, albuterol, benzonatate, ondansetron **OR** ondansetron (ZOFRAN) IV   Assessment/Plan   Robyn Porter is a 65 y.o. female with history of diastolic CHF LVEF [id number] in [place] 02/2015, COPD, and Asthma who presented to [facility] 06/12/15 with worsening SOB for several days. States she has gained 40 lbs in just over 2 months. She was seen @ [facility] ED 06/11/15 for same reason, given IV lasix and steroids and sent home. HHRN recommended she return to ED for further treatment. Admitted to [facility] with volume overload. HF team consult with A/C diastolic HF.   1. Acute on chronic systolic CHF  - She remains markedly volume overloaded on exam with pitting edema into her thighs and abdomen.  - Continue IV lasix gtt at 12 mg/hr. Increase 2.5 mg metolazone to BID. - Reviewed fluid and sodium restriction and importance of medical compliance. - Diuresed OK so far.  Think PICC would be helpful to better assess fluid status.Can consider moving to stepdown for CVP monitoring - Echo 06/14/15 shows EF 40-45%, down from 50-55% reportedly in 02/2015. She may benefit from ischemic work up once fully diuresed 2. S/p [employer] PPM - Interrogated by [employer] yesterday. Single chamber pacer, Pacing 85% of the time. Battery and leads WNLs. 3. Chronic Afib - Rate controlled on toprol.  - Continue Xarelto - This patients CHA2DS2-VASc Score and unadjusted Ischemic Stroke Rate (% per year) is at least 4.8 % stroke rate/year from a score of 4 4. COPD - Stable. Per primary - Not a former smoker, though long exposure to second hand. - ?OHS   Not on chronic 02.  5. OSA/ ? OHS component - Continue CPAP nightly   6. CKD stage III - Stable. Follow closely with diuresis.   Length of Stay: 2  Shirley Friar  PA-C 06/15/2015, 8:32 AM  Advanced Heart Failure Team Pager [phone] (M-F; 7a - 4p)  Please contact [facility] for night-coverage after hours (4p -7a ) and weekends on [email or web address]  Patient seen with PA, agree with the above note.    She diuresed well yesterday.  Still very volume overloaded.  - Agree with continuing Lasix infusion and metolazone 2.5 mg bid today.  - Given body habitus, think it would be helpful to have PICC to follow CVP so we do not over- or under-shoot her diuresis.  Have to be careful with CKD stage III. She can be monitored in step-down with PICC.   EF 40-45% with 85% RV pacing. RV pacing may be contributing to worsening of EF.  In absence  of chest pain, would avoid invasive ischemic workup given CKD.  Non-invasive workup (cardiolite) would be limited by body habitus.  - Continue losartan 50 daily as long as creatinine is stable.  - Stop metoprolol tartrate and use Toprol XL 50 mg daily.   Chronic afib, continue Xarelto.   Suspect OHS/OSA.   [name] 06/15/2015 10:13 AM

## 2015-06-15 NOTE — Progress Notes (Signed)
Pt transferred to [facility], Room 22.  Report given to receiving nurse.  VSS.  Pt in no acute distress.

## 2015-06-15 NOTE — Progress Notes (Signed)
PICC line placed.  Xray performed, verifying that PICC is in place and ready for use.  Cindy, IV Team, has verified this and states that PICC is ready for use.

## 2015-06-15 NOTE — Progress Notes (Signed)
[facility]                                                                              Patient Demographics  Robyn Porter, is a 65 y.o. female, DOB - February 18, 1951, [id number]  Admit date - 06/12/2015   Admitting Physician [name], MD  Outpatient Primary MD for the patient is ADDIS,DANIEL, DO  LOS - 2   Chief Complaint  Patient presents with  . Shortness of Breath       Brief HPI    Robyn Porter is a 65 y.o. female with history of diastolic CHF last EF measured in November 2016 was 50-55% presented to the ER because of increasing shortness of breath and increased weight. Patient states that over the last 1 month patient has been noticing increasing shortness of breath both on sitting and exertion. Denied any chest pain. Patient's Lasix was recently increased to 20 mg twice a day. Patient reported that she had been to the [facility] ER at least twice over the last 1 month. Patient was admitted at [facility] in November for Streptococcus bacteremia secondary to cellulitis at that time patient had 2-D echo and transesophageal echocardiogram which showed EF of 50-55%. At this time chest x-ray shows congestion and patient has been placed on Lasix and admitted for CHF.    Assessment & Plan    Principal Problem:   Acute on chronic diastolic CHF (congestive heart failure), NYHA class 1 ([place]): BNP 940 - she reported that she was 383 lbs on 12/18 when she was discharged from [facility]. Currently 419lbs from 423 lbs, massively volume overloaded.   -Appreciate cardiology conditions, placed on IV Lasix infusion with metolazone for aggressive diuresis. - 2-D echo showed EF of 40-45% with 85% RV pacing, moderate pulmonary hypertension, per cardiology RV pacing may be contributing to worsening of EF. - Continue losartan, placed on Toprol-XL - Negative balance of 6.13 L, weight 423 lbs down to 414 lbs   Active Problems: Acute  bronchitis/COPD- improving  - Placed on DuoNeb's 4 times a day, Symbicort, prednisone  Proteus mirabilis UTI - Urine culture showed Proteus mirabilis UTI, placed on Rocephin IV for 3 days, follow closely for any diarrhea - Patient reported that she was admitted in [facility] last month and had C. difficile positive  - Also add probiotics    Chronic atrial fibrillation ([place]) - Rate controlled, continue metoprolol - [place] vasc score 4, continue xarelto    Diabetes mellitus type 2 in obese ([place]) - Placed on sliding scale insulin, follow CBGs    History of pacemaker    Chronic anemia - anemia panel consistent with anemia of chronic disease     OSA (obstructive sleep apnea) Continue CPAP   ? Chronic kidney disease  - Baseline not available , creatinine improving  Code Status: Full CODE STATUS   Family Communication: Discussed in detail with the patient, all imaging results, lab results explained to the patient   Disposition Plan:   Time Spent in minutes   25 minutes  Procedures  None   Consult cardiology  DVT Prophylaxis  rivaroxaban  Medications  Scheduled Meds: . allopurinol  100 mg Oral Daily  . atorvastatin  20 mg Oral Daily  . budesonide-formoterol  2 puff Inhalation BID  . cholecalciferol  5,000 Units Oral Daily  . ferrous sulfate  650 mg Oral BID WC  . insulin aspart  0-9 Units Subcutaneous TID WC  . ipratropium-albuterol  3 mL Nebulization TID  . loratadine  10 mg Oral Daily  . losartan  50 mg Oral Daily  . metolazone  2.5 mg Oral BID  . metoprolol succinate  50 mg Oral Daily  . multivitamin with minerals  1 tablet Oral Daily  . rivaroxaban  20 mg Oral Q supper  . sodium chloride flush  3 mL Intravenous Q12H  . cyanocobalamin  500 mcg Oral Daily  . vitamin C  500 mg Oral Daily   Continuous Infusions: . furosemide (LASIX) infusion 12 mg/hr (06/14/15 1408)   PRN Meds:.acetaminophen **OR** acetaminophen, albuterol, benzonatate, ondansetron  **OR** ondansetron (ZOFRAN) IV   Antibiotics   Anti-infectives    Start     Dose/Rate Route Frequency Ordered Stop   06/13/15 0915  cefTRIAXone (ROCEPHIN) 1 g in dextrose 5 % 50 mL IVPB  Status:  Discontinued     1 g 100 mL/hr over 30 Minutes Intravenous Every 24 hours 06/13/15 0912 06/13/15 0915        Subjective:   Robyn Porter was seen and examined today. Feeling somewhat better today, shortness of breath is improving. No chest pain. No fevers or chills or wheezing.  Patient denies abdominal pain, N/V/D/C, new weakness, numbess, tingling. No acute events overnight.    Objective:   Filed Vitals:   06/14/15 2351 06/15/15 0446 06/15/15 0752 06/15/15 1128  BP: 126/56 124/51 112/58 114/45  Pulse: 64 66 60 61  Temp: 97.6 F (36.4 C) 97.3 F (36.3 C) 97.6 F (36.4 C) 98 F (36.7 C)  TempSrc: Oral Oral Oral Oral  Resp: 16 18 18 18   Height:      Weight:  187.789 kg (414 lb)    SpO2: 96% 98% 98% 98%    Intake/Output Summary (Last 24 hours) at 06/15/15 1145 Last data filed at 06/15/15 1134  Gross per 24 hour  Intake 1460.4 ml  Output   6801 ml  Net -5340.6 ml     Wt Readings from Last 3 Encounters:  06/15/15 187.789 kg (414 lb)     Exam  General: Alert and oriented x 3, NAD,   HEENT:  PERRLA, EOMI, Anicteric Sclera, mucous membranes moist.   Neck: Supple, +JVD, no masses  CVS: S1 S2 clear, regular rate and rhythm, 1/6SEM  Respiratory: Bibasilar crackles   Abdomen :  morbidly obese Soft, nontender, distended, + bowel sounds  Ext: no cyanosis clubbing, 3+ edema  Neuro: no new deficit   Skin: No rashes  Psych: Normal affect and demeanor, alert and oriented x3    Data Review   Micro Results Recent Results (from the past 240 hour(s))  Urine culture     Status: None   Collection Time: 06/13/15  1:34 AM  Result Value Ref Range Status   Specimen Description URINE, CATHETERIZED  Final   Special Requests NONE  Final   Culture >=100,000 COLONIES/mL  PROTEUS MIRABILIS  Final   Report Status 06/15/2015 FINAL  Final   Organism ID, Bacteria PROTEUS MIRABILIS  Final      Susceptibility   Proteus mirabilis - MIC*    AMPICILLIN >=32 RESISTANT Resistant     CEFAZOLIN <=4 SENSITIVE Sensitive  CEFTRIAXONE <=1 SENSITIVE Sensitive     CIPROFLOXACIN >=4 RESISTANT Resistant     GENTAMICIN <=1 SENSITIVE Sensitive     IMIPENEM 2 SENSITIVE Sensitive     NITROFURANTOIN 128 RESISTANT Resistant     TRIMETH/SULFA >=320 RESISTANT Resistant     AMPICILLIN/SULBACTAM 4 SENSITIVE Sensitive     PIP/TAZO <=4 SENSITIVE Sensitive     * >=100,000 COLONIES/mL PROTEUS MIRABILIS    Radiology Reports Dg Chest 2 View  06/12/2015  CLINICAL DATA:  65 year old female with shortness of breath and cough for the past month EXAM: CHEST  2 VIEW COMPARISON:  None. FINDINGS: Left subclavian approach single lead cardiac rhythm maintenance device with the lead projected over the right ventricle. Cardiomegaly. Pulmonary vascular congestion with mild interstitial edema. Small bilateral pleural effusions with associated bibasilar atelectasis. No acute osseous abnormality. IMPRESSION: Mild-moderate CHF. Electronically Signed   By: Jacqulynn Cadet M.D.   On: 06/12/2015 20:23    CBC  Recent Labs Lab 06/12/15 1938 06/13/15 0322 06/14/15 0540 06/15/15 0530  WBC 7.9 9.4 5.4 4.9  HGB 9.4* 9.1* 8.6* 8.7*  HCT 31.1* 30.2* 28.5* 28.1*  PLT 139* 152 121* 118*  MCV 94.2 94.7 95.0 94.9  MCH 28.5 28.5 28.7 29.4  MCHC 30.2 30.1 30.2 31.0  RDW 18.0* 17.9* 18.0* 17.8*  LYMPHSABS  --  1.1  --   --   MONOABS  --  0.7  --   --   EOSABS  --  0.0  --   --   BASOSABS  --  0.0  --   --     Chemistries   Recent Labs Lab 06/12/15 1938 06/13/15 0916 06/14/15 0540 06/15/15 0530  NA 142 143 141 142  K 5.0 4.9 4.7 4.6  CL 107 110 108 107  CO2 22 24 25 25   GLUCOSE 117* 130* 107* 113*  BUN 38* 42* 45* 49*  CREATININE 1.86* 1.96* 1.93* 1.87*  CALCIUM 9.6 9.3 9.2 9.6  MG  --   --    --  1.9   ------------------------------------------------------------------------------------------------------------------ estimated creatinine clearance is 50.4 mL/min (by C-G formula based on Cr of 1.87). ------------------------------------------------------------------------------------------------------------------ No results for input(s): HGBA1C in the last 72 hours. ------------------------------------------------------------------------------------------------------------------ No results for input(s): CHOL, HDL, LDLCALC, TRIG, CHOLHDL, LDLDIRECT in the last 72 hours. ------------------------------------------------------------------------------------------------------------------ No results for input(s): TSH, T4TOTAL, T3FREE, THYROIDAB in the last 72 hours.  Invalid input(s): FREET3 ------------------------------------------------------------------------------------------------------------------  Recent Labs  06/14/15 0540  VITAMINB12 766  FOLATE 23.2  FERRITIN 390*  TIBC 287  IRON 35  RETICCTPCT 1.8    Coagulation profile No results for input(s): INR, PROTIME in the last 168 hours.  No results for input(s): DDIMER in the last 72 hours.  Cardiac Enzymes  Recent Labs Lab 06/13/15 0322 06/13/15 0916 06/13/15 1457  TROPONINI 0.03 0.03 0.03   ------------------------------------------------------------------------------------------------------------------ Invalid input(s): POCBNP   Recent Labs  06/13/15 2043 06/14/15 0547 06/14/15 1107 06/14/15 1709 06/14/15 2010 06/15/15 0635  GLUCAP 127* 94 113* 25* 81* 65     Keanu Lesniak M.D. [facility] 06/15/2015, 11:45 AM  Pager: [phone] Between 7am to 7pm - call Pager - [phone]  After 7pm go to [email or web address] - password [email or web address]  Call night coverage person covering after 7pm

## 2015-06-15 NOTE — Progress Notes (Signed)
Notified Oda Kilts, PA-C, that the requested transfer bed to [facility], cannot be completed, as [facility] does not have a room available.  It was suggested to request a bed from [facility].

## 2015-06-15 NOTE — Progress Notes (Signed)
Called and spoke with Shawn in bed placement f/u transfer pt for CVP monitoring.  Instructed there was no bed right now.  Notified primary nurse.  Karie Kirks, Therapist, sports.

## 2015-06-15 NOTE — Procedures (Signed)
Pt is using home cpap with home settings.

## 2015-06-15 NOTE — Progress Notes (Signed)
Peripherally Inserted Central Catheter/Midline Placement  The IV Nurse has discussed with the patient and/or persons authorized to consent for the patient, the purpose of this procedure and the potential benefits and risks involved with this procedure.  The benefits include less needle sticks, lab draws from the catheter and patient may be discharged home with the catheter.  Risks include, but not limited to, infection, bleeding, blood clot (thrombus formation), and puncture of an artery; nerve damage and irregular heat beat.  Alternatives to this procedure were also discussed.  PICC/Midline Placement Documentation        Darlyn Read 06/15/2015, 4:04 PM

## 2015-06-16 DIAGNOSIS — I5023 Acute on chronic systolic (congestive) heart failure: Secondary | ICD-10-CM | POA: Insufficient documentation

## 2015-06-16 LAB — GLUCOSE, CAPILLARY
GLUCOSE-CAPILLARY: 87 mg/dL (ref 65–99)
GLUCOSE-CAPILLARY: 98 mg/dL (ref 65–99)
Glucose-Capillary: 105 mg/dL — ABNORMAL HIGH (ref 65–99)
Glucose-Capillary: 91 mg/dL (ref 65–99)

## 2015-06-16 LAB — BASIC METABOLIC PANEL
ANION GAP: 11 (ref 5–15)
BUN: 50 mg/dL — ABNORMAL HIGH (ref 6–20)
CHLORIDE: 102 mmol/L (ref 101–111)
CO2: 27 mmol/L (ref 22–32)
Calcium: 9.5 mg/dL (ref 8.9–10.3)
Creatinine, Ser: 1.84 mg/dL — ABNORMAL HIGH (ref 0.44–1.00)
GFR calc non Af Amer: 27 mL/min — ABNORMAL LOW (ref >60)
GFR, EST AFRICAN AMERICAN: 32 mL/min — AB (ref >60)
Glucose, Bld: 112 mg/dL — ABNORMAL HIGH (ref 65–99)
POTASSIUM: 3.9 mmol/L (ref 3.5–5.1)
Sodium: 140 mmol/L (ref 135–145)

## 2015-06-16 LAB — CBC
HEMATOCRIT: 30 % — AB (ref 36.0–46.0)
HEMOGLOBIN: 9.2 g/dL — AB (ref 12.0–15.0)
MCH: 28.8 pg (ref 26.0–34.0)
MCHC: 30.7 g/dL (ref 30.0–36.0)
MCV: 94 fL (ref 78.0–100.0)
Platelets: 133 10*3/uL — ABNORMAL LOW (ref 150–400)
RBC: 3.19 MIL/uL — AB (ref 3.87–5.11)
RDW: 17.7 % — ABNORMAL HIGH (ref 11.5–15.5)
WBC: 5.8 10*3/uL (ref 4.0–10.5)

## 2015-06-16 MED ORDER — CETYLPYRIDINIUM CHLORIDE 0.05 % MT LIQD
7.0000 mL | Freq: Two times a day (BID) | OROMUCOSAL | Status: DC
  Administered 2015-06-16 – 2015-06-30 (×25): 7 mL via OROMUCOSAL

## 2015-06-16 NOTE — Procedures (Signed)
Pt is on home cpap with home settings. 

## 2015-06-16 NOTE — Progress Notes (Addendum)
Patient ID: Hallie Christoffel, female   DOB: [birth year], 65 y.o.   MRN: [id number]     Subjective:    SOB improving but not resolved  Objective:   Temp:  [97.6 F (36.4 C)-98.1 F (36.7 C)] 97.9 F (36.6 C) (02/18 0757) Pulse Rate:  [55-68] 68 (02/18 0757) Resp:  [12-18] 18 (02/18 0757) BP: (103-124)/(43-65) 110/47 mmHg (02/18 0455) SpO2:  [96 %-100 %] 100 % (02/18 0757) Weight:  [395 lb (179.171 kg)-414 lb (187.789 kg)] 395 lb (179.171 kg) (02/18 0455) Last BM Date: 06/14/15  Filed Weights   06/15/15 0446 06/15/15 1838 06/16/15 0455  Weight: 414 lb (187.789 kg) 414 lb (187.789 kg) 395 lb (179.171 kg)    Intake/Output Summary (Last 24 hours) at 06/16/15 [id number] Last data filed at 06/16/15 0400  Gross per 24 hour  Intake 1152.8 ml  Output   4876 ml  Net -3723.2 ml    Telemetry: V-paced  Exam:  General: NAD  HEENT: sclera clear, throat clear  Resp: decrease breath sounds bilateral bases  Cardiac: irreg, normal rate, no m/r/g. Difficult to visualize JVD  GI: abdomen soft, NT, ND  MSK: 1+ bilateral LE edema  Neuro: no focal deficits  Psych: appropriate affect  Lab Results:  Basic Metabolic Panel:  Recent Labs Lab 06/14/15 0540 06/15/15 0530 06/16/15 0510  NA 141 142 140  K 4.7 4.6 3.9  CL 108 107 102  CO2 25 25 27   GLUCOSE 107* 113* 112*  BUN 45* 49* 50*  CREATININE 1.93* 1.87* 1.84*  CALCIUM 9.2 9.6 9.5  MG  --  1.9  --     Liver Function Tests: No results for input(s): AST, ALT, ALKPHOS, BILITOT, PROT, ALBUMIN in the last 168 hours.  CBC:  Recent Labs Lab 06/14/15 0540 06/15/15 0530 06/16/15 0510  WBC 5.4 4.9 5.8  HGB 8.6* 8.7* 9.2*  HCT 28.5* 28.1* 30.0*  MCV 95.0 94.9 94.0  PLT 121* 118* 133*    Cardiac Enzymes:  Recent Labs Lab 06/13/15 0322 06/13/15 0916 06/13/15 1457  TROPONINI 0.03 0.03 0.03    BNP: No results for input(s): PROBNP in the last 8760 hours.  Coagulation: No results for input(s): INR in the last 168  hours.  ECG:   Medications:   Scheduled Medications: . allopurinol  100 mg Oral Daily  . antiseptic oral rinse  7 mL Mouth Rinse BID  . atorvastatin  20 mg Oral Daily  . budesonide-formoterol  2 puff Inhalation BID  . cefTRIAXone (ROCEPHIN)  IV  1 g Intravenous Q24H  . cholecalciferol  5,000 Units Oral Daily  . ferrous sulfate  650 mg Oral BID WC  . insulin aspart  0-9 Units Subcutaneous TID WC  . ipratropium-albuterol  3 mL Nebulization TID  . loratadine  10 mg Oral Daily  . losartan  50 mg Oral Daily  . metolazone  2.5 mg Oral BID  . metoprolol succinate  50 mg Oral Daily  . multivitamin with minerals  1 tablet Oral Daily  . rivaroxaban  20 mg Oral Q supper  . saccharomyces boulardii  250 mg Oral BID  . sodium chloride flush  10-40 mL Intracatheter Q12H  . sodium chloride flush  3 mL Intravenous Q12H  . cyanocobalamin  500 mcg Oral Daily  . vitamin C  500 mg Oral Daily     Infusions: . furosemide (LASIX) infusion 12 mg/hr (06/15/15 1216)     PRN Medications:  acetaminophen **OR** acetaminophen, albuterol, benzonatate, ondansetron **OR** ondansetron (ZOFRAN) IV, sodium  chloride flush     Assessment/Plan    1. Acute on chronic systolic HF - [id number] echo LVEF 40-45%, mild AS, moderate MR, PASP 47 - negative 3.7 liters yesterday, negative 9.5 liters since admission. She is on lasix drip at 12 and metolazone 2.5mg  bid. Mild downtrend in Cr consistent with CHF and venous congestion. Continue current diuretics.  - medical therapy with losartan 50, toprol XL 50. Given borderline renal function and current hypervolemia would not titrate either at this time - she has PICC line, CVP this AM 15 indicating contnued elevated filling pressures and hypervolemia.   2. Pacemaker - normal function by recent check during admission.   3. Afib - rate controlled with Toprol, on xarelto for stroke prevention.   4. CKD III-IV - stable renal function with diuresis, continue to monitor  closely.   Carlyle Dolly, M.D.

## 2015-06-16 NOTE — Progress Notes (Signed)
[facility]                                                                              Patient Demographics  Robyn Porter, is a 65 y.o. female, DOB - 1950/06/25, [id number]  Admit date - 06/12/2015   Admitting Physician [name], MD  Outpatient Primary MD for the patient is ADDIS,DANIEL, DO  LOS - 3   Chief Complaint  Patient presents with  . Shortness of Breath       Brief HPI    Robyn Porter is a 65 y.o. female with history of diastolic CHF last EF measured in November 2016 was 50-55% presented to the ER because of increasing shortness of breath and increased weight. Patient states that over the last 1 month patient has been noticing increasing shortness of breath both on sitting and exertion. Denied any chest pain. Patient's Lasix was recently increased to 20 mg twice a day. Patient reported that she had been [facility] ER at least twice over the last 1 month. Patient was admitted at [facility] in November for Streptococcus bacteremia secondary to cellulitis at that time patient had 2-D echo and transesophageal echocardiogram which showed EF of 50-55%. At this time chest x-ray shows congestion and patient has been placed on Lasix and admitted for CHF.    Assessment & Plan    Principal Problem:   Acute on chronic diastolic CHF (congestive heart failure), NYHA class 1 ([place]): BNP 940 - she reported that she was 383 lbs on 12/18 when she was discharged from [facility]. Currently 419lbs from 423 lbs, massively volume overloaded.   -Appreciate cardiology recommendations, on IV Lasix infusion with metolazone for aggressive diuresis. - 2-D echo showed EF of 40-45% with 85% RV pacing, moderate pulmonary hypertension, per cardiology RV pacing may be contributing to worsening of EF. - Continue losartan, placed on Toprol-XL - Negative balance of 10 L, weight 423 lbs down to 395  Active Problems: Acute  bronchitis/COPD- improving  - Placed on DuoNeb's 4 times a day, Symbicort  Proteus mirabilis UTI - Urine culture showed Proteus mirabilis UTI, placed on Rocephin IV for 3 days, follow closely for any diarrhea - Patient reported that she was admitted in [facility] last month and had C. difficile positive  - added probiotics    Chronic atrial fibrillation ([place]) - Rate controlled, continue metoprolol - [place] vasc score 4, continue xarelto    Diabetes mellitus type 2 in obese ([place]) - Placed on sliding scale insulin, follow CBGs    History of pacemaker    Chronic anemia - anemia panel consistent with anemia of chronic disease     OSA (obstructive sleep apnea) Continue CPAP   ? Chronic kidney disease  - Baseline not available , creatinine improving  Code Status: Full CODE STATUS   Family Communication: Discussed in detail with the patient, all imaging results, lab results explained to the patient   Disposition Plan:   Time Spent in minutes   15 minutes  Procedures  None   Consult cardiology  DVT Prophylaxis  rivaroxaban  Medications  Scheduled Meds: . allopurinol  100 mg Oral Daily  . antiseptic oral rinse  7 mL Mouth Rinse BID  . atorvastatin  20 mg Oral Daily  . budesonide-formoterol  2 puff Inhalation BID  . cefTRIAXone (ROCEPHIN)  IV  1 g Intravenous Q24H  . cholecalciferol  5,000 Units Oral Daily  . ferrous sulfate  650 mg Oral BID WC  . insulin aspart  0-9 Units Subcutaneous TID WC  . ipratropium-albuterol  3 mL Nebulization TID  . loratadine  10 mg Oral Daily  . losartan  50 mg Oral Daily  . metolazone  2.5 mg Oral BID  . metoprolol succinate  50 mg Oral Daily  . multivitamin with minerals  1 tablet Oral Daily  . rivaroxaban  20 mg Oral Q supper  . saccharomyces boulardii  250 mg Oral BID  . sodium chloride flush  10-40 mL Intracatheter Q12H  . sodium chloride flush  3 mL Intravenous Q12H  . cyanocobalamin  500 mcg Oral Daily  . vitamin C  500  mg Oral Daily   Continuous Infusions: . furosemide (LASIX) infusion 12 mg/hr (06/15/15 1216)   PRN Meds:.acetaminophen **OR** acetaminophen, albuterol, benzonatate, ondansetron **OR** ondansetron (ZOFRAN) IV, sodium chloride flush   Antibiotics   Anti-infectives    Start     Dose/Rate Route Frequency Ordered Stop   06/15/15 1230  cefTRIAXone (ROCEPHIN) 1 g in dextrose 5 % 50 mL IVPB     1 g 100 mL/hr over 30 Minutes Intravenous Every 24 hours 06/15/15 1149 06/18/15 1229   06/13/15 0915  cefTRIAXone (ROCEPHIN) 1 g in dextrose 5 % 50 mL IVPB  Status:  Discontinued     1 g 100 mL/hr over 30 Minutes Intravenous Every 24 hours 06/13/15 0912 06/13/15 0915        Subjective:   Robyn Porter was seen and examined today. Denies any specific complaints. Shortness of breath improving. No chest pain. No fevers or chills or wheezing.  Patient denies abdominal pain, N/V/D/C, new weakness, numbess, tingling. No acute events overnight.    Objective:   Filed Vitals:   06/16/15 0752 06/16/15 0757 06/16/15 1040 06/16/15 1157  BP:   122/41 107/46  Pulse:  68 77 63  Temp:  97.9 F (36.6 C)  97.7 F (36.5 C)  TempSrc:  Oral  Oral  Resp:  18  18  Height:      Weight:      SpO2: 97% 100%  98%    Intake/Output Summary (Last 24 hours) at 06/16/15 1236 Last data filed at 06/16/15 1100  Gross per 24 hour  Intake  497.8 ml  Output   4475 ml  Net -3977.2 ml     Wt Readings from Last 3 Encounters:  06/16/15 179.171 kg (395 lb)     Exam  General: Alert and oriented x 3, NAD,   HEENT:    Neck: Supple, +JVD  CVS: S1 S2 clear, regular rate and rhythm, 1/6SEM  Respiratory: Bibasilar crackles , no wheezing  Abdomen :  morbidly obese Soft, nontender, distended, + bowel sounds  Ext: no cyanosis clubbing, 3+ edema  Neuro: no new deficit   Skin: No rashes  Psych: Normal affect and demeanor, alert and oriented x3    Data Review   Micro Results Recent Results (from the past  240 hour(s))  Urine culture     Status: None   Collection Time: 06/13/15  1:34 AM  Result Value Ref Range Status   Specimen Description URINE, CATHETERIZED  Final   Special  Requests NONE  Final   Culture >=100,000 COLONIES/mL PROTEUS MIRABILIS  Final   Report Status 06/15/2015 FINAL  Final   Organism ID, Bacteria PROTEUS MIRABILIS  Final      Susceptibility   Proteus mirabilis - MIC*    AMPICILLIN >=32 RESISTANT Resistant     CEFAZOLIN <=4 SENSITIVE Sensitive     CEFTRIAXONE <=1 SENSITIVE Sensitive     CIPROFLOXACIN >=4 RESISTANT Resistant     GENTAMICIN <=1 SENSITIVE Sensitive     IMIPENEM 2 SENSITIVE Sensitive     NITROFURANTOIN 128 RESISTANT Resistant     TRIMETH/SULFA >=320 RESISTANT Resistant     AMPICILLIN/SULBACTAM 4 SENSITIVE Sensitive     PIP/TAZO <=4 SENSITIVE Sensitive     * >=100,000 COLONIES/mL PROTEUS MIRABILIS    Radiology Reports Dg Chest 2 View  06/12/2015  CLINICAL DATA:  65 year old female with shortness of breath and cough for the past month EXAM: CHEST  2 VIEW COMPARISON:  None. FINDINGS: Left subclavian approach single lead cardiac rhythm maintenance device with the lead projected over the right ventricle. Cardiomegaly. Pulmonary vascular congestion with mild interstitial edema. Small bilateral pleural effusions with associated bibasilar atelectasis. No acute osseous abnormality. IMPRESSION: Mild-moderate CHF. Electronically Signed   By: Jacqulynn Cadet M.D.   On: 06/12/2015 20:23   Dg Chest Port 1 View  06/15/2015  CLINICAL DATA:  Evaluate line placement. EXAM: PORTABLE CHEST 1 VIEW COMPARISON:  06/12/2015 FINDINGS: Again noted is a single lead left cardiac pacemaker. Placement of a right arm PICC line. The PICC catheter extends into the SVC but the tip is not well visualized. Heart is enlarged. Again noted are prominent central vascular structures. Trachea is midline. Negative for a pneumothorax. IMPRESSION: PICC line extends into the SVC region but the tip is  not well visualized. Consider further evaluation with oblique images or lateral view. Again noted is cardiomegaly with enlarged central vascular structures. Findings are suggestive for vascular congestion or mild edema. Electronically Signed   By: Markus Daft M.D.   On: 06/15/2015 17:08    CBC  Recent Labs Lab 06/12/15 1938 06/13/15 0322 06/14/15 0540 06/15/15 0530 06/16/15 0510  WBC 7.9 9.4 5.4 4.9 5.8  HGB 9.4* 9.1* 8.6* 8.7* 9.2*  HCT 31.1* 30.2* 28.5* 28.1* 30.0*  PLT 139* 152 121* 118* 133*  MCV 94.2 94.7 95.0 94.9 94.0  MCH 28.5 28.5 28.7 29.4 28.8  MCHC 30.2 30.1 30.2 31.0 30.7  RDW 18.0* 17.9* 18.0* 17.8* 17.7*  LYMPHSABS  --  1.1  --   --   --   MONOABS  --  0.7  --   --   --   EOSABS  --  0.0  --   --   --   BASOSABS  --  0.0  --   --   --     Chemistries   Recent Labs Lab 06/12/15 1938 06/13/15 0916 06/14/15 0540 06/15/15 0530 06/16/15 0510  NA 142 143 141 142 140  K 5.0 4.9 4.7 4.6 3.9  CL 107 110 108 107 102  CO2 22 24 25 25 27   GLUCOSE 117* 130* 107* 113* 112*  BUN 38* 42* 45* 49* 50*  CREATININE 1.86* 1.96* 1.93* 1.87* 1.84*  CALCIUM 9.6 9.3 9.2 9.6 9.5  MG  --   --   --  1.9  --    ------------------------------------------------------------------------------------------------------------------ estimated creatinine clearance is 49.6 mL/min (by C-G formula based on Cr of 1.84). ------------------------------------------------------------------------------------------------------------------ No results for input(s): HGBA1C in the last 72  hours. ------------------------------------------------------------------------------------------------------------------ No results for input(s): CHOL, HDL, LDLCALC, TRIG, CHOLHDL, LDLDIRECT in the last 72 hours. ------------------------------------------------------------------------------------------------------------------ No results for input(s): TSH, T4TOTAL, T3FREE, THYROIDAB in the last 72 hours.  Invalid  input(s): FREET3 ------------------------------------------------------------------------------------------------------------------  Recent Labs  06/14/15 0540  VITAMINB12 766  FOLATE 23.2  FERRITIN 390*  TIBC 287  IRON 35  RETICCTPCT 1.8    Coagulation profile No results for input(s): INR, PROTIME in the last 168 hours.  No results for input(s): DDIMER in the last 72 hours.  Cardiac Enzymes  Recent Labs Lab 06/13/15 0322 06/13/15 0916 06/13/15 1457  TROPONINI 0.03 0.03 0.03   ------------------------------------------------------------------------------------------------------------------ Invalid input(s): POCBNP   Recent Labs  06/15/15 0635 06/15/15 1130 06/15/15 1658 06/15/15 2127 06/16/15 0732 06/16/15 1155  GLUCAP 96 156* 109* 169* 36 105*     RAI,RIPUDEEP M.D. [facility] 06/16/2015, 12:36 PM  Pager: [phone] Between 7am to 7pm - call Pager - [phone]  After 7pm go to [email or web address] - password [email or web address]  Call night coverage person covering after 7pm

## 2015-06-17 LAB — CBC
HCT: 29.3 % — ABNORMAL LOW (ref 36.0–46.0)
Hemoglobin: 8.8 g/dL — ABNORMAL LOW (ref 12.0–15.0)
MCH: 28.6 pg (ref 26.0–34.0)
MCHC: 30 g/dL (ref 30.0–36.0)
MCV: 95.1 fL (ref 78.0–100.0)
PLATELETS: 143 10*3/uL — AB (ref 150–400)
RBC: 3.08 MIL/uL — AB (ref 3.87–5.11)
RDW: 18.1 % — ABNORMAL HIGH (ref 11.5–15.5)
WBC: 7.1 10*3/uL (ref 4.0–10.5)

## 2015-06-17 LAB — BASIC METABOLIC PANEL
Anion gap: 11 (ref 5–15)
BUN: 56 mg/dL — ABNORMAL HIGH (ref 6–20)
CALCIUM: 9.8 mg/dL (ref 8.9–10.3)
CO2: 31 mmol/L (ref 22–32)
CREATININE: 1.88 mg/dL — AB (ref 0.44–1.00)
Chloride: 100 mmol/L — ABNORMAL LOW (ref 101–111)
GFR calc non Af Amer: 27 mL/min — ABNORMAL LOW (ref >60)
GFR, EST AFRICAN AMERICAN: 31 mL/min — AB (ref >60)
Glucose, Bld: 86 mg/dL (ref 65–99)
Potassium: 3.8 mmol/L (ref 3.5–5.1)
SODIUM: 142 mmol/L (ref 135–145)

## 2015-06-17 LAB — GLUCOSE, CAPILLARY
GLUCOSE-CAPILLARY: 119 mg/dL — AB (ref 65–99)
Glucose-Capillary: 76 mg/dL (ref 65–99)
Glucose-Capillary: 114 mg/dL — ABNORMAL HIGH (ref 65–99)
Glucose-Capillary: 98 mg/dL (ref 65–99)

## 2015-06-17 LAB — MAGNESIUM: MAGNESIUM: 1.7 mg/dL (ref 1.7–2.4)

## 2015-06-17 MED ORDER — METOLAZONE 5 MG PO TABS
2.5000 mg | ORAL_TABLET | Freq: Every day | ORAL | Status: DC
  Administered 2015-06-17 – 2015-06-18 (×2): 2.5 mg via ORAL
  Filled 2015-06-17 (×2): qty 1

## 2015-06-17 NOTE — Progress Notes (Signed)
[facility]                                                                              Patient Demographics  Robyn Porter, is a 65 y.o. female, DOB - 11/01/1950, [id number]  Admit date - 06/12/2015   Admitting Physician [name], MD  Outpatient Primary MD for the patient is ADDIS,DANIEL, DO  LOS - 4   Chief Complaint  Patient presents with  . Shortness of Breath       Brief HPI    Robyn Porter is a 65 y.o. female with history of diastolic CHF last EF measured in November 2016 was 50-55% presented to the ER because of increasing shortness of breath and increased weight. Patient states that over the last 1 month patient has been noticing increasing shortness of breath both on sitting and exertion. Denied any chest pain. Patient's Lasix was recently increased to 20 mg twice a day. Patient reported that she had been to the [facility] ER at least twice over the last 1 month. Patient was admitted at [facility] in November for Streptococcus bacteremia secondary to cellulitis at that time patient had 2-D echo and transesophageal echocardiogram which showed EF of 50-55%. At this time chest x-ray shows congestion and patient has been placed on Lasix and admitted for CHF.    Assessment & Plan    Principal Problem:   Acute on chronic diastolic CHF (congestive heart failure), NYHA class 1 ([place]): BNP 940 - she reported that she was 383 lbs on 12/18 when she was discharged from [facility]. Currently 419lbs from 423 lbs, massively volume overloaded.   -Appreciate cardiology recommendations, on IV Lasix infusion with metolazone for aggressive diuresis. - 2-D echo showed EF of 40-45% with 85% RV pacing, moderate pulmonary hypertension, per cardiology RV pacing may be contributing to worsening of EF. - Continue losartan, placed on Toprol-XL - Negative balance of 16.7L, weight 423 lbs down to 388lbs   Active Problems: Acute  bronchitis/COPD- improving  - Placed on DuoNeb's 4 times a day, Symbicort  Proteus mirabilis UTI - Urine culture showed Proteus mirabilis UTI, placed on Rocephin IV for 3 days, follow closely for any diarrhea - Patient reported that she was admitted in [facility] last month and had C. difficile positive  - added probiotics    Chronic atrial fibrillation ([place]) - Rate controlled, continue metoprolol - [place] vasc score 4, continue xarelto    Diabetes mellitus type 2 in obese ([place]) - Placed on sliding scale insulin, follow CBGs    History of pacemaker    Chronic anemia - anemia panel consistent with anemia of chronic disease     OSA (obstructive sleep apnea) Continue CPAP   ? Chronic kidney disease  - Baseline not available , creatinine improving  Code Status: Full CODE STATUS   Family Communication: Discussed in detail with the patient, all imaging results, lab results explained to the patient   Disposition Plan:   Time Spent in minutes   15 minutes  Procedures  None   Consult cardiology  DVT Prophylaxis  rivaroxaban  Medications  Scheduled Meds: . allopurinol  100 mg Oral Daily  . antiseptic oral rinse  7 mL Mouth Rinse BID  . atorvastatin  20 mg Oral Daily  . budesonide-formoterol  2 puff Inhalation BID  . cholecalciferol  5,000 Units Oral Daily  . ferrous sulfate  650 mg Oral BID WC  . insulin aspart  0-9 Units Subcutaneous TID WC  . ipratropium-albuterol  3 mL Nebulization TID  . loratadine  10 mg Oral Daily  . losartan  50 mg Oral Daily  . metolazone  2.5 mg Oral Daily  . metoprolol succinate  50 mg Oral Daily  . multivitamin with minerals  1 tablet Oral Daily  . rivaroxaban  20 mg Oral Q supper  . saccharomyces boulardii  250 mg Oral BID  . sodium chloride flush  10-40 mL Intracatheter Q12H  . sodium chloride flush  3 mL Intravenous Q12H  . cyanocobalamin  500 mcg Oral Daily  . vitamin C  500 mg Oral Daily   Continuous Infusions: .  furosemide (LASIX) infusion 10 mg/hr (06/17/15 1010)   PRN Meds:.acetaminophen **OR** acetaminophen, albuterol, benzonatate, ondansetron **OR** ondansetron (ZOFRAN) IV, sodium chloride flush   Antibiotics   Anti-infectives    Start     Dose/Rate Route Frequency Ordered Stop   06/15/15 1230  cefTRIAXone (ROCEPHIN) 1 g in dextrose 5 % 50 mL IVPB     1 g 100 mL/hr over 30 Minutes Intravenous Every 24 hours 06/15/15 1149 06/17/15 1317   06/13/15 0915  cefTRIAXone (ROCEPHIN) 1 g in dextrose 5 % 50 mL IVPB  Status:  Discontinued     1 g 100 mL/hr over 30 Minutes Intravenous Every 24 hours 06/13/15 0912 06/13/15 0915        Subjective:   Robyn Porter was seen and examined today. Overall improving, no complaints. No chest pain, shortness of breath improving.  Patient denies abdominal pain, N/V/D/C, new weakness, numbess, tingling. No acute events overnight.    Objective:   Filed Vitals:   06/17/15 0442 06/17/15 0732 06/17/15 0949 06/17/15 1108  BP: 99/54   111/53  Pulse: 69   64  Temp: 97.4 F (36.3 C) 97.9 F (36.6 C)    TempSrc:  Oral    Resp: 15   19  Height:      Weight: 175.996 kg (388 lb)     SpO2: 96%  96% 97%    Intake/Output Summary (Last 24 hours) at 06/17/15 1402 Last data filed at 06/17/15 1045  Gross per 24 hour  Intake    624 ml  Output   7225 ml  Net  -6601 ml     Wt Readings from Last 3 Encounters:  06/17/15 175.996 kg (388 lb)     Exam  General: Alert and oriented x 3, NAD,   HEENT:    Neck: Supple, +JVD  CVS: S1 S2 clear, regular rate and rhythm, 1/6SEM  Respiratory: Fairly clear to auscultation bilaterally  Abdomen :  morbidly obese Soft, nontender, distended, + bowel sounds  Ext: no cyanosis clubbing, trace -1+ edema, lymphadenopathy  Neuro: no new deficit   Skin: No rashes  Psych: Normal affect and demeanor, alert and oriented x3    Data Review   Micro Results Recent Results (from the past 240 hour(s))  Urine culture      Status: None   Collection Time: 06/13/15  1:34 AM  Result Value Ref Range Status   Specimen Description URINE, CATHETERIZED  Final   Special Requests NONE  Final   Culture >=100,000 COLONIES/mL  PROTEUS MIRABILIS  Final   Report Status 06/15/2015 FINAL  Final   Organism ID, Bacteria PROTEUS MIRABILIS  Final      Susceptibility   Proteus mirabilis - MIC*    AMPICILLIN >=32 RESISTANT Resistant     CEFAZOLIN <=4 SENSITIVE Sensitive     CEFTRIAXONE <=1 SENSITIVE Sensitive     CIPROFLOXACIN >=4 RESISTANT Resistant     GENTAMICIN <=1 SENSITIVE Sensitive     IMIPENEM 2 SENSITIVE Sensitive     NITROFURANTOIN 128 RESISTANT Resistant     TRIMETH/SULFA >=320 RESISTANT Resistant     AMPICILLIN/SULBACTAM 4 SENSITIVE Sensitive     PIP/TAZO <=4 SENSITIVE Sensitive     * >=100,000 COLONIES/mL PROTEUS MIRABILIS    Radiology Reports Dg Chest 2 View  06/12/2015  CLINICAL DATA:  65 year old female with shortness of breath and cough for the past month EXAM: CHEST  2 VIEW COMPARISON:  None. FINDINGS: Left subclavian approach single lead cardiac rhythm maintenance device with the lead projected over the right ventricle. Cardiomegaly. Pulmonary vascular congestion with mild interstitial edema. Small bilateral pleural effusions with associated bibasilar atelectasis. No acute osseous abnormality. IMPRESSION: Mild-moderate CHF. Electronically Signed   By: Jacqulynn Cadet M.D.   On: 06/12/2015 20:23   Dg Chest Port 1 View  06/15/2015  CLINICAL DATA:  Evaluate line placement. EXAM: PORTABLE CHEST 1 VIEW COMPARISON:  06/12/2015 FINDINGS: Again noted is a single lead left cardiac pacemaker. Placement of a right arm PICC line. The PICC catheter extends into the SVC but the tip is not well visualized. Heart is enlarged. Again noted are prominent central vascular structures. Trachea is midline. Negative for a pneumothorax. IMPRESSION: PICC line extends into the SVC region but the tip is not well visualized. Consider  further evaluation with oblique images or lateral view. Again noted is cardiomegaly with enlarged central vascular structures. Findings are suggestive for vascular congestion or mild edema. Electronically Signed   By: Markus Daft M.D.   On: 06/15/2015 17:08    CBC  Recent Labs Lab 06/13/15 0322 06/14/15 0540 06/15/15 0530 06/16/15 0510 06/17/15 0445  WBC 9.4 5.4 4.9 5.8 7.1  HGB 9.1* 8.6* 8.7* 9.2* 8.8*  HCT 30.2* 28.5* 28.1* 30.0* 29.3*  PLT 152 121* 118* 133* 143*  MCV 94.7 95.0 94.9 94.0 95.1  MCH 28.5 28.7 29.4 28.8 28.6  MCHC 30.1 30.2 31.0 30.7 30.0  RDW 17.9* 18.0* 17.8* 17.7* 18.1*  LYMPHSABS 1.1  --   --   --   --   MONOABS 0.7  --   --   --   --   EOSABS 0.0  --   --   --   --   BASOSABS 0.0  --   --   --   --     Chemistries   Recent Labs Lab 06/13/15 0916 06/14/15 0540 06/15/15 0530 06/16/15 0510 06/17/15 0445  NA 143 141 142 140 142  K 4.9 4.7 4.6 3.9 3.8  CL 110 108 107 102 100*  CO2 24 25 25 27 31   GLUCOSE 130* 107* 113* 112* 86  BUN 42* 45* 49* 50* 56*  CREATININE 1.96* 1.93* 1.87* 1.84* 1.88*  CALCIUM 9.3 9.2 9.6 9.5 9.8  MG  --   --  1.9  --  1.7   ------------------------------------------------------------------------------------------------------------------ estimated creatinine clearance is 48 mL/min (by C-G formula based on Cr of 1.88). ------------------------------------------------------------------------------------------------------------------ No results for input(s): HGBA1C in the last 72 hours. ------------------------------------------------------------------------------------------------------------------ No results for input(s): CHOL, HDL, LDLCALC, TRIG, CHOLHDL,  LDLDIRECT in the last 72 hours. ------------------------------------------------------------------------------------------------------------------ No results for input(s): TSH, T4TOTAL, T3FREE, THYROIDAB in the last 72 hours.  Invalid input(s):  FREET3 ------------------------------------------------------------------------------------------------------------------ No results for input(s): VITAMINB12, FOLATE, FERRITIN, TIBC, IRON, RETICCTPCT in the last 72 hours.  Coagulation profile No results for input(s): INR, PROTIME in the last 168 hours.  No results for input(s): DDIMER in the last 72 hours.  Cardiac Enzymes  Recent Labs Lab 06/13/15 0322 06/13/15 0916 06/13/15 1457  TROPONINI 0.03 0.03 0.03   ------------------------------------------------------------------------------------------------------------------ Invalid input(s): POCBNP   Recent Labs  06/16/15 0732 06/16/15 1155 06/16/15 1714 06/16/15 2051 06/17/15 0730 06/17/15 [place] 105* 91 98 76 119*     RAI,RIPUDEEP M.D. [facility] 06/17/2015, 2:02 PM  Pager: [phone] Between 7am to 7pm - call Pager - [phone]  After 7pm go to [email or web address] - password [email or web address]  Call night coverage person covering after 7pm

## 2015-06-17 NOTE — Progress Notes (Signed)
Patient ID: Robyn Porter, female   DOB: 05/18/1950, 65 y.o.   MRN: [id number]      Subjective:    Sob improving  Objective:   Temp:  [97.4 F (36.3 C)-98 F (36.7 C)] 97.9 F (36.6 C) (02/19 0732) Pulse Rate:  [60-77] 69 (02/19 0442) Resp:  [15-19] 15 (02/19 0442) BP: (99-122)/(41-68) 99/54 mmHg (02/19 0442) SpO2:  [96 %-99 %] 96 % (02/19 0442) Weight:  [388 lb (175.996 kg)] 388 lb (175.996 kg) (02/19 0442) Last BM Date: 06/16/15  Filed Weights   06/15/15 1838 06/16/15 0455 06/17/15 0442  Weight: 414 lb (187.789 kg) 395 lb (179.171 kg) 388 lb (175.996 kg)    Intake/Output Summary (Last 24 hours) at 06/17/15 [id number] Last data filed at 06/17/15 [id number]  Gross per 24 hour  Intake    144 ml  Output   6175 ml  Net  -6031 ml    Telemetry: V-paced  Exam:  General: NAD  HEENT: sclera clear, throat clear  Resp: CTAB  Cardiac: RRR, no m/r/g, difficult to visualize JVD due to neck habitus  GI: abdomen soft, NT, ND  MSK: trace bilateral edema  Neuro: no focal deficits  Psych: appropriate affect  Lab Results:  Basic Metabolic Panel:  Recent Labs Lab 06/15/15 0530 06/16/15 0510 06/17/15 0445  NA 142 140 142  K 4.6 3.9 3.8  CL 107 102 100*  CO2 25 27 31   GLUCOSE 113* 112* 86  BUN 49* 50* 56*  CREATININE 1.87* 1.84* 1.88*  CALCIUM 9.6 9.5 9.8  MG 1.9  --  1.7    Liver Function Tests: No results for input(s): AST, ALT, ALKPHOS, BILITOT, PROT, ALBUMIN in the last 168 hours.  CBC:  Recent Labs Lab 06/15/15 0530 06/16/15 0510 06/17/15 0445  WBC 4.9 5.8 7.1  HGB 8.7* 9.2* 8.8*  HCT 28.1* 30.0* 29.3*  MCV 94.9 94.0 95.1  PLT 118* 133* 143*    Cardiac Enzymes:  Recent Labs Lab 06/13/15 0322 06/13/15 0916 06/13/15 1457  TROPONINI 0.03 0.03 0.03    BNP: No results for input(s): PROBNP in the last 8760 hours.  Coagulation: No results for input(s): INR in the last 168 hours.  ECG:   Medications:   Scheduled Medications: . allopurinol   100 mg Oral Daily  . antiseptic oral rinse  7 mL Mouth Rinse BID  . atorvastatin  20 mg Oral Daily  . budesonide-formoterol  2 puff Inhalation BID  . cefTRIAXone (ROCEPHIN)  IV  1 g Intravenous Q24H  . cholecalciferol  5,000 Units Oral Daily  . ferrous sulfate  650 mg Oral BID WC  . insulin aspart  0-9 Units Subcutaneous TID WC  . ipratropium-albuterol  3 mL Nebulization TID  . loratadine  10 mg Oral Daily  . losartan  50 mg Oral Daily  . metolazone  2.5 mg Oral BID  . metoprolol succinate  50 mg Oral Daily  . multivitamin with minerals  1 tablet Oral Daily  . rivaroxaban  20 mg Oral Q supper  . saccharomyces boulardii  250 mg Oral BID  . sodium chloride flush  10-40 mL Intracatheter Q12H  . sodium chloride flush  3 mL Intravenous Q12H  . cyanocobalamin  500 mcg Oral Daily  . vitamin C  500 mg Oral Daily     Infusions: . furosemide (LASIX) infusion 12 mg/hr (06/17/15 0648)     PRN Medications:  acetaminophen **OR** acetaminophen, albuterol, benzonatate, ondansetron **OR** ondansetron (ZOFRAN) IV, sodium chloride flush  Assessment/Plan    1. Acute on chronic systolic HF - [id number] echo LVEF 40-45%, mild AS, moderate MR, PASP 47 - negative 6 liters yesterday, negative 15.5 liters since admission. She is on lasix drip at 12 and metolazone 2.5mg  bid. Mild uptrend in Cr  - she has PICC line, CVP this AM 15 indicating contnued elevated filling pressures and hypervolemia.  - medical therapy with losartan 50, toprol XL 50. Given borderline renal function and current hypervolemia would not titrate either at this time - very significant diuresis yesterday, will cut down lasix drip to10 and metolazone to 2.5mg  daily, goal would be net negative 2-3 liters per day  2. Pacemaker - normal function by recent check during admission.   3. Afib - rate controlled with Toprol, on xarelto for stroke prevention.   4. CKD III-IV - overall stable renal function with diuresis, continue to  monitor closely.    Carlyle Dolly, M.D.

## 2015-06-18 LAB — BASIC METABOLIC PANEL
Anion gap: 12 (ref 5–15)
BUN: 54 mg/dL — AB (ref 6–20)
CALCIUM: 9.3 mg/dL (ref 8.9–10.3)
CHLORIDE: 96 mmol/L — AB (ref 101–111)
CO2: 32 mmol/L (ref 22–32)
CREATININE: 1.81 mg/dL — AB (ref 0.44–1.00)
GFR calc non Af Amer: 28 mL/min — ABNORMAL LOW (ref >60)
GFR, EST AFRICAN AMERICAN: 32 mL/min — AB (ref >60)
Glucose, Bld: 93 mg/dL (ref 65–99)
Potassium: 3.5 mmol/L (ref 3.5–5.1)
Sodium: 140 mmol/L (ref 135–145)

## 2015-06-18 LAB — GLUCOSE, CAPILLARY
GLUCOSE-CAPILLARY: 115 mg/dL — AB (ref 65–99)
Glucose-Capillary: 84 mg/dL (ref 65–99)
Glucose-Capillary: 110 mg/dL — ABNORMAL HIGH (ref 65–99)

## 2015-06-18 LAB — CBC
HCT: 28 % — ABNORMAL LOW (ref 36.0–46.0)
Hemoglobin: 8.5 g/dL — ABNORMAL LOW (ref 12.0–15.0)
MCH: 28.8 pg (ref 26.0–34.0)
MCHC: 30.4 g/dL (ref 30.0–36.0)
MCV: 94.9 fL (ref 78.0–100.0)
Platelets: 136 10*3/uL — ABNORMAL LOW (ref 150–400)
RBC: 2.95 MIL/uL — ABNORMAL LOW (ref 3.87–5.11)
RDW: 18 % — AB (ref 11.5–15.5)
WBC: 6.6 10*3/uL (ref 4.0–10.5)

## 2015-06-18 NOTE — Progress Notes (Signed)
Physical Therapy Treatment Patient Details Name: Robyn Porter MRN: [id number] DOB: Jan 28, 1951 Today's Date: 06/18/2015    History of Present Illness Robyn Porter is a 65 y.o. female with history of diastolic CHF last EF measured in November 2016 was 50-55% presents to the ER because of increasing shortness of breath and increased weight. Patient states that over the last 1 month patient has been noticing increasing shortness of breath both on sitting and exertion. Denies any chest pain    PT Comments    Pt able to increase activity today and ambulate a short distance.  Pt very eager to continue working with therapy and was able to maintain O2 sats in 90's while on 2L O2 throughout session.  Will continue to follow.    Follow Up Recommendations  SNF;Supervision - Intermittent     Equipment Recommendations  None recommended by PT    Recommendations for Other Services       Precautions / Restrictions Precautions Precautions: Fall;ICD/Pacemaker Restrictions Weight Bearing Restrictions: No    Mobility  Bed Mobility               General bed mobility comments: pt sitting in recliner.  Transfers Overall transfer level: Needs assistance Equipment used: Rolling walker (2 wheeled) Transfers: Sit to/from Stand Sit to Stand: Min guard         General transfer comment: pt uses momentum with coming to stnading and leans strongly anteriorly.  pt demonstrates good use of UEs.    Ambulation/Gait Ambulation/Gait assistance: Min guard;+2 safety/equipment Ambulation Distance (Feet): 4 Feet (forward and backward x2) Assistive device: Rolling walker (2 wheeled) Gait Pattern/deviations: Step-through pattern;Decreased stride length;Trunk flexed;Wide base of support     General Gait Details: pt initially performed pre-gait activities with marching in place, but was then able to perform short distance ambulating forwards and backwards.  pt with effortful gait and fatiuges  easily, but has been able to progress mobility.   Stairs            Wheelchair Mobility    Modified Rankin (Stroke Patients Only)       Balance Overall balance assessment: Needs assistance Sitting-balance support: No upper extremity supported;Feet supported Sitting balance-Leahy Scale: Good     Standing balance support: Bilateral upper extremity supported;During functional activity Standing balance-Leahy Scale: Fair                      Cognition Arousal/Alertness: Awake/alert Behavior During Therapy: WFL for tasks assessed/performed Overall Cognitive Status: Within Functional Limits for tasks assessed                      Exercises Other Exercises Other Exercises: pt performed slow sit to/from stands x5 with MinG A.      General Comments        Pertinent Vitals/Pain Pain Assessment: No/denies pain    Home Living                      Prior Function            PT Goals (current goals can now be found in the care plan section) Acute Rehab PT Goals Patient Stated Goal: get better to go home soon PT Goal Formulation: With patient Time For Goal Achievement: 06/27/15 Potential to Achieve Goals: Fair Progress towards PT goals: Progressing toward goals    Frequency  Min 2X/week    PT Plan Current plan remains appropriate    Co-evaluation  End of Session Equipment Utilized During Treatment: Oxygen;Gait belt Activity Tolerance: Patient tolerated treatment well Patient left: in chair;with call bell/phone within reach     Time: [id number] PT Time Calculation (min) (ACUTE ONLY): 26 min  Charges:  $Gait Training: 8-22 mins $Therapeutic Activity: 8-22 mins                    G CodesCatarina Porter, [place] 06/18/2015, 12:18 PM

## 2015-06-18 NOTE — NC FL2 (Signed)
[place] LEVEL OF CARE SCREENING TOOL     IDENTIFICATION  Patient Name: Robyn Porter Birthdate: [birth year] Sex: female Admission Date (Current Location): 06/12/2015  [place] and [place] Number:   [facility] in [place])   Facility and Address:  [facility], [place] [address] [place], [place] [address]      Provider Number: [id number]  Attending Physician Name and Address:  Mendel Corning, MD  Relative Name and Phone Number:  Drucie Opitz [phone]    Current Level of Care: Hospital Recommended Level of Care: [place] Prior Approval Number:    Date Approved/Denied:   PASRR Number:    Discharge Plan: SNF    Current Diagnoses: Patient Active Problem List   Diagnosis Date Noted  . Acute on chronic systolic heart failure ([place])   . CHF (congestive heart failure) ([place]) 06/13/2015  . Acute on chronic diastolic CHF (congestive heart failure), NYHA class 1 ([place]) 06/13/2015  . Chronic atrial fibrillation ([place]) 06/13/2015  . Diabetes mellitus type 2 in obese ([place]) 06/13/2015  . History of pacemaker 06/13/2015  . Chronic anemia 06/13/2015  . OSA (obstructive sleep apnea) 06/13/2015  . Acute on chronic congestive heart failure ([facility])     Orientation RESPIRATION BLADDER Height & Weight     Self, Time, Situation, Place  O2 (2L) Indwelling catheter Weight: (!) 380 lb (172.367 kg) Height:  5\' 4"  (162.6 cm)  BEHAVIORAL SYMPTOMS/MOOD NEUROLOGICAL BOWEL NUTRITION STATUS   (appropriate)  (n/a) Continent Diet (Heart Healthy)  AMBULATORY STATUS COMMUNICATION OF NEEDS Skin   Limited Assist Verbally Normal                       Personal Care Assistance Level of Assistance  Bathing, Dressing, Feeding Bathing Assistance: Maximum assistance Feeding assistance: Limited assistance Dressing Assistance: Limited assistance     Functional Limitations Info  Sight, Hearing, Speech Sight Info: Adequate Hearing Info:  Adequate Speech Info: Adequate    SPECIAL CARE FACTORS FREQUENCY  PT (By licensed PT), OT (By licensed OT)     PT Frequency: 5x/week OT Frequency: 5x/week            Contractures Contractures Info: Not present    Additional Factors Info  Code Status, Allergies Code Status Info: FULL CODE Allergies Info: Amoxicillin, Contrast Media (Iodinated Diagnostic Agents)           Current Medications (06/18/2015):  This is the current hospital active medication list Current Facility-Administered Medications  Medication Dose Route Frequency Provider Last Rate Last Dose  . acetaminophen (TYLENOL) tablet 650 mg  650 mg Oral Q6H PRN [name], MD       Or  . acetaminophen (TYLENOL) suppository 650 mg  650 mg Rectal Q6H PRN [name], MD      . albuterol (PROVENTIL) (2.5 MG/3ML) 0.083% nebulizer solution 2.5 mg  2.5 mg Nebulization Q6H PRN [name], MD      . allopurinol (ZYLOPRIM) tablet 100 mg  100 mg Oral Daily [name], MD   100 mg at 06/18/15 0820  . antiseptic oral rinse (CPC / CETYLPYRIDINIUM CHLORIDE 0.05%) solution 7 mL  7 mL Mouth Rinse BID Ripudeep K Rai, MD   7 mL at 06/18/15 1000  . atorvastatin (LIPITOR) tablet 20 mg  20 mg Oral Daily [name], MD   20 mg at 06/18/15 0820  . benzonatate (TESSALON) capsule 200 mg  200 mg Oral TID PRN Jaquita Rector  Aida Puffer, MD      . budesonide-formoterol (SYMBICORT) 80-4.5 MCG/ACT inhaler 2 puff  2 puff Inhalation BID [name], MD   2 puff at 06/18/15 0840  . cholecalciferol (VITAMIN D) tablet 5,000 Units  5,000 Units Oral Daily [name], MD   5,000 Units at 06/18/15 0820  . ferrous sulfate tablet 650 mg  650 mg Oral BID WC [name], MD   650 mg at 06/18/15 0820  . furosemide (LASIX) 250 mg in dextrose 5 % 250 mL (1 mg/mL) infusion  10 mg/hr Intravenous Continuous Arnoldo Lenis, MD 10 mL/hr at 06/17/15 1010 10 mg/hr at 06/17/15 1010  . insulin aspart (novoLOG)  injection 0-9 Units  0-9 Units Subcutaneous TID WC [name], MD   2 Units at 06/15/15 1208  . ipratropium-albuterol (DUONEB) 0.5-2.5 (3) MG/3ML nebulizer solution 3 mL  3 mL Nebulization TID Ripudeep Krystal Eaton, MD   3 mL at 06/18/15 0840  . loratadine (CLARITIN) tablet 10 mg  10 mg Oral Daily [name], MD   10 mg at 06/18/15 0820  . losartan (COZAAR) tablet 50 mg  50 mg Oral Daily [name], MD   50 mg at 06/18/15 [id number]  . metolazone (ZAROXOLYN) tablet 2.5 mg  2.5 mg Oral Daily Arnoldo Lenis, MD   2.5 mg at 06/18/15 0819  . metoprolol succinate (TOPROL-XL) 24 hr tablet 50 mg  50 mg Oral Daily Larey Dresser, MD   50 mg at 06/18/15 0820  . multivitamin with minerals tablet 1 tablet  1 tablet Oral Daily [name], MD   1 tablet at 06/18/15 [id number]  . ondansetron (ZOFRAN) tablet 4 mg  4 mg Oral Q6H PRN [name], MD       Or  . ondansetron [facility]) injection 4 mg  4 mg Intravenous Q6H PRN [name], MD      . rivaroxaban Alveda Reasons) tablet 20 mg  20 mg Oral Q supper [name], MD   20 mg at 06/17/15 1712  . saccharomyces boulardii (FLORASTOR) capsule 250 mg  250 mg Oral BID Ripudeep Krystal Eaton, MD   250 mg at 06/18/15 0819  . sodium chloride flush (NS) 0.9 % injection 10-40 mL  10-40 mL Intracatheter Q12H Ripudeep K Rai, MD   10 mL at 06/15/15 2200  . sodium chloride flush (NS) 0.9 % injection 10-40 mL  10-40 mL Intracatheter PRN Ripudeep K Rai, MD      . sodium chloride flush (NS) 0.9 % injection 3 mL  3 mL Intravenous Q12H [name], MD   3 mL at 06/18/15 0824  . vitamin B-12 (CYANOCOBALAMIN) tablet 500 mcg  500 mcg Oral Daily [name], MD   500 mcg at 06/18/15 [phone]  . vitamin C (ASCORBIC ACID) tablet 500 mg  500 mg Oral Daily [name], MD   500 mg at 06/18/15 [id number]     Discharge Medications: Please see discharge summary for a list of discharge medications.  Relevant Imaging Results:  Relevant Lab  Results:   Additional Information SS# [id number]   [place] Intern, [id number]

## 2015-06-18 NOTE — Progress Notes (Addendum)
Patient ID: Robyn Porter, female   DOB: 03-Jan-1951, 65 y.o.   MRN: [id number]      Subjective:    Continues to diurese with lasix drip. Brisk diuresis noted. Weight down 8 pounds.   Denies SOB.   Objective:   Temp:  [97.9 F (36.6 C)-98.1 F (36.7 C)] 98.1 F (36.7 C) (02/20 0800) Pulse Rate:  [64-72] 69 (02/20 0818) Resp:  [12-24] 17 (02/20 0818) BP: (97-112)/(38-53) 108/41 mmHg (02/20 0818) SpO2:  [95 %-99 %] 95 % (02/20 0842) Weight:  [380 lb (172.367 kg)] 380 lb (172.367 kg) (02/20 0400) Last BM Date: 06/17/15  Filed Weights   06/16/15 0455 06/17/15 0442 06/18/15 0400  Weight: 395 lb (179.171 kg) 388 lb (175.996 kg) 380 lb (172.367 kg)    Intake/Output Summary (Last 24 hours) at 06/18/15 0849 Last data filed at 06/18/15 0700  Gross per 24 hour  Intake   1160 ml  Output   6000 ml  Net  -4840 ml    Telemetry: V-paced  Exam: CVP 13.  General: NAD. In bed.   HEENT: sclera clear, throat clear  Resp: CTAB  Cardiac: RRR, no m/r/g, JVP 14 cm.   GI: abdomen soft, NT, ND  MSK: R and LLE 2+ edema  Neuro: no focal deficits  Psych: appropriate affect  Lab Results:  Basic Metabolic Panel:  Recent Labs Lab 06/15/15 0530 06/16/15 0510 06/17/15 0445 06/18/15 0455  NA 142 140 142 140  K 4.6 3.9 3.8 3.5  CL 107 102 100* 96*  CO2 25 27 31  32  GLUCOSE 113* 112* 86 93  BUN 49* 50* 56* 54*  CREATININE 1.87* 1.84* 1.88* 1.81*  CALCIUM 9.6 9.5 9.8 9.3  MG 1.9  --  1.7  --     Liver Function Tests: No results for input(s): AST, ALT, ALKPHOS, BILITOT, PROT, ALBUMIN in the last 168 hours.  CBC:  Recent Labs Lab 06/16/15 0510 06/17/15 0445 06/18/15 0455  WBC 5.8 7.1 6.6  HGB 9.2* 8.8* 8.5*  HCT 30.0* 29.3* 28.0*  MCV 94.0 95.1 94.9  PLT 133* 143* 136*    Cardiac Enzymes:  Recent Labs Lab 06/13/15 0322 06/13/15 0916 06/13/15 1457  TROPONINI 0.03 0.03 0.03    BNP: No results for input(s): PROBNP in the last 8760  hours.  Coagulation: No results for input(s): INR in the last 168 hours.  ECG:   Medications:   Scheduled Medications: . allopurinol  100 mg Oral Daily  . antiseptic oral rinse  7 mL Mouth Rinse BID  . atorvastatin  20 mg Oral Daily  . budesonide-formoterol  2 puff Inhalation BID  . cholecalciferol  5,000 Units Oral Daily  . ferrous sulfate  650 mg Oral BID WC  . insulin aspart  0-9 Units Subcutaneous TID WC  . ipratropium-albuterol  3 mL Nebulization TID  . loratadine  10 mg Oral Daily  . losartan  50 mg Oral Daily  . metolazone  2.5 mg Oral Daily  . metoprolol succinate  50 mg Oral Daily  . multivitamin with minerals  1 tablet Oral Daily  . rivaroxaban  20 mg Oral Q supper  . saccharomyces boulardii  250 mg Oral BID  . sodium chloride flush  10-40 mL Intracatheter Q12H  . sodium chloride flush  3 mL Intravenous Q12H  . cyanocobalamin  500 mcg Oral Daily  . vitamin C  500 mg Oral Daily    Infusions: . furosemide (LASIX) infusion 10 mg/hr (06/17/15 1010)    PRN Medications:  acetaminophen **OR** acetaminophen, albuterol, benzonatate, ondansetron **OR** ondansetron (ZOFRAN) IV, sodium chloride flush     Assessment/Plan   Robyn Porter is a 65 y.o. female with history of diastolic CHF LVEF 99991111 in [place] 02/2015, COPD, and Asthma who presented to [facility] 06/12/15 with worsening SOB for several day  1. Acute on chronic systolic CHF  - Echo 99991111 shows EF 40-45%, down from 50-55% reportedly in 02/2015.  - Volume status improving. Continue lasix drip 10 per hour + metolazone 2.5 daily.  - Renal function stable.  2. S/p [employer] PPM - Interrogated by [employer].Single chamber pacer, Pacing 85% of the time. Battery and leads WNLs. 3. Chronic Afib - Rate controlled on toprol.  - Continue Xarelto - This patients CHA2DS2-VASc Score and unadjusted Ischemic Stroke Rate (4.8 % per year)  from a score of 4 4. COPD - Stable. Per primary - Not a former smoker, though long  exposure to second hand. - ?OHS Not on chronic 02.  5. OSA/ ? OHS component - Continue CPAP nightly  6. CKD stage III - Stable. Follow closely with diuresis. Todays creatinine 1.8   [name] NP-C  9:02 AM   Patient seen with NP, agree with the above note.  CVP remains 13.  She continues to diurese well, weight continues to fall.  Creatinine stable.  Rate controlled.  - Continue Lasix 10 mg/hr + metolazone 2.5 daily.   Xarelto dosed at 20 mg daily is appropriate based on creatinine clearance calculated at her weight.   Will need SNF at discharge.   [name] 06/18/2015 9:10 AM

## 2015-06-18 NOTE — Plan of Care (Signed)
Problem: Cardiac: Goal: Ability to achieve and maintain adequate cardiopulmonary perfusion will improve Pt has been tolerating IV lasix well. Has had great UOP. Pt reports that her dyspnea is improving.

## 2015-06-18 NOTE — Progress Notes (Signed)
[facility]                                                                              Patient Demographics  Robyn Porter, is a 65 y.o. female, DOB - [birth year], [id number]  Admit date - 06/12/2015   Admitting Physician [name], MD  Outpatient Primary MD for the patient is Porter,DANIEL, DO  LOS - 5   Chief Complaint  Patient presents with  . Shortness of Breath       Brief HPI    Robyn Porter is a 65 y.o. female with history of diastolic CHF last EF measured in November 2016 was 50-55% presented to the ER because of increasing shortness of breath and increased weight. Patient states that over the last 1 month patient has been noticing increasing shortness of breath both on sitting and exertion. Denied any chest pain. Patient's Lasix was recently increased to 20 mg twice a day. Patient reported that she had been to the [facility] ER at least twice over the last 1 month. Patient was admitted at [facility] in November for Streptococcus bacteremia secondary to cellulitis at that time patient had 2-D echo and transesophageal echocardiogram which showed EF of 50-55%. At this time chest x-ray shows congestion and patient has been placed on Lasix and admitted for CHF.    Assessment & Plan    Principal Problem:   Acute on chronic diastolic CHF (congestive heart failure), NYHA class 1 ([place]): BNP 940 - she reported that she was 383 lbs on 12/18 when she was discharged from [facility]. Currently 419lbs from 423 lbs, massively volume overloaded.   -  on IV Lasix infusion with metolazone for aggressive diuresis. - 2-D echo showed EF of 40-45% with 85% RV pacing, moderate pulmonary hypertension, per cardiology RV pacing may be contributing to worsening of EF. - Continue losartan, placed on Toprol-XL - Negative balance of 20.6l, weight 423 lbs down to 380 lbs   Active Problems: Acute bronchitis/COPD- improving  - Placed on  DuoNeb's 4 times a day, Symbicort  Proteus mirabilis UTI - Urine culture showed Proteus mirabilis UTI, placed on Rocephin IV for 3 days, follow closely for any diarrhea - Patient reported that she was admitted in [facility] last month and had C. difficile positive  - added probiotics    Chronic atrial fibrillation ([place]) - Rate controlled, continue metoprolol - [place] vasc score 4, continue xarelto    Diabetes mellitus type 2 in obese ([place]) - Placed on sliding scale insulin, follow CBGs    History of pacemaker    Chronic anemia - anemia panel consistent with anemia of chronic disease     OSA (obstructive sleep apnea) Continue CPAP   ? Chronic kidney disease , likely chronic kidney disease stage III - Baseline not available , creatinine stable  Code Status: Full CODE STATUS   Family Communication: Discussed in detail with the patient, all imaging results, lab results explained to the patient   Disposition Plan: Will need skilled nursing facility, once cleared by cardiology  Time Spent in minutes   15 minutes  Procedures  None   Consult  cardiology  DVT Prophylaxis  rivaroxaban  Medications  Scheduled Meds: . allopurinol  100 mg Oral Daily  . antiseptic oral rinse  7 mL Mouth Rinse BID  . atorvastatin  20 mg Oral Daily  . budesonide-formoterol  2 puff Inhalation BID  . cholecalciferol  5,000 Units Oral Daily  . ferrous sulfate  650 mg Oral BID WC  . insulin aspart  0-9 Units Subcutaneous TID WC  . ipratropium-albuterol  3 mL Nebulization TID  . loratadine  10 mg Oral Daily  . losartan  50 mg Oral Daily  . metolazone  2.5 mg Oral Daily  . metoprolol succinate  50 mg Oral Daily  . multivitamin with minerals  1 tablet Oral Daily  . rivaroxaban  20 mg Oral Q supper  . saccharomyces boulardii  250 mg Oral BID  . sodium chloride flush  10-40 mL Intracatheter Q12H  . sodium chloride flush  3 mL Intravenous Q12H  . cyanocobalamin  500 mcg Oral Daily  . vitamin  C  500 mg Oral Daily   Continuous Infusions: . furosemide (LASIX) infusion 10 mg/hr (06/17/15 1010)   PRN Meds:.acetaminophen **OR** acetaminophen, albuterol, benzonatate, ondansetron **OR** ondansetron (ZOFRAN) IV, sodium chloride flush   Antibiotics   Anti-infectives    Start     Dose/Rate Route Frequency Ordered Stop   06/15/15 1230  cefTRIAXone (ROCEPHIN) 1 g in dextrose 5 % 50 mL IVPB     1 g 100 mL/hr over 30 Minutes Intravenous Every 24 hours 06/15/15 1149 06/17/15 1317   06/13/15 0915  cefTRIAXone (ROCEPHIN) 1 g in dextrose 5 % 50 mL IVPB  Status:  Discontinued     1 g 100 mL/hr over 30 Minutes Intravenous Every 24 hours 06/13/15 0912 06/13/15 0915        Subjective:   Robyn Porter was seen and examined today. she denies any specific complaints, has dry cough  No chest pain, shortness of breath improving.  Patient denies abdominal pain, N/V/D/C, new weakness, numbess, tingling. No acute events overnight.    Objective:   Filed Vitals:   06/18/15 0818 06/18/15 0842 06/18/15 1200 06/18/15 1226  BP: 108/41   109/53  Pulse: 69   47  Temp:   98.1 F (36.7 C)   TempSrc:   Oral   Resp: 17     Height:      Weight:      SpO2: 98% 95%  96%    Intake/Output Summary (Last 24 hours) at 06/18/15 1252 Last data filed at 06/18/15 1244  Gross per 24 hour  Intake   1210 ml  Output   5400 ml  Net  -4190 ml     Wt Readings from Last 3 Encounters:  06/18/15 172.367 kg (380 lb)     Exam  General: Alert and oriented x 3, NAD,   HEENT:    Neck: Supple, +JVD  CVS: S1 S2 clear, regular rate and rhythm, 1/6SEM  Respiratory: Fairly clear to auscultation bilaterally  Abdomen :  morbidly obese Soft, nontender, distended, + bowel sounds  Ext: no cyanosis clubbing, trace -1+ edema, lymphadenopathy  Neuro: no new deficit   Skin: No rashes  Psych: Normal affect and demeanor, alert and oriented x3    Data Review   Micro Results Recent Results (from the past  240 hour(s))  Urine culture     Status: None   Collection Time: 06/13/15  1:34 AM  Result Value Ref Range Status   Specimen Description URINE, CATHETERIZED  Final  Special Requests NONE  Final   Culture >=100,000 COLONIES/mL PROTEUS MIRABILIS  Final   Report Status 06/15/2015 FINAL  Final   Organism ID, Bacteria PROTEUS MIRABILIS  Final      Susceptibility   Proteus mirabilis - MIC*    AMPICILLIN >=32 RESISTANT Resistant     CEFAZOLIN <=4 SENSITIVE Sensitive     CEFTRIAXONE <=1 SENSITIVE Sensitive     CIPROFLOXACIN >=4 RESISTANT Resistant     GENTAMICIN <=1 SENSITIVE Sensitive     IMIPENEM 2 SENSITIVE Sensitive     NITROFURANTOIN 128 RESISTANT Resistant     TRIMETH/SULFA >=320 RESISTANT Resistant     AMPICILLIN/SULBACTAM 4 SENSITIVE Sensitive     PIP/TAZO <=4 SENSITIVE Sensitive     * >=100,000 COLONIES/mL PROTEUS MIRABILIS    Radiology Reports Dg Chest 2 View  06/12/2015  CLINICAL DATA:  65 year old female with shortness of breath and cough for the past month EXAM: CHEST  2 VIEW COMPARISON:  None. FINDINGS: Left subclavian approach single lead cardiac rhythm maintenance device with the lead projected over the right ventricle. Cardiomegaly. Pulmonary vascular congestion with mild interstitial edema. Small bilateral pleural effusions with associated bibasilar atelectasis. No acute osseous abnormality. IMPRESSION: Mild-moderate CHF. Electronically Signed   By: Jacqulynn Cadet M.D.   On: 06/12/2015 20:23   Dg Chest Port 1 View  06/15/2015  CLINICAL DATA:  Evaluate line placement. EXAM: PORTABLE CHEST 1 VIEW COMPARISON:  06/12/2015 FINDINGS: Again noted is a single lead left cardiac pacemaker. Placement of a right arm PICC line. The PICC catheter extends into the SVC but the tip is not well visualized. Heart is enlarged. Again noted are prominent central vascular structures. Trachea is midline. Negative for a pneumothorax. IMPRESSION: PICC line extends into the SVC region but the tip is  not well visualized. Consider further evaluation with oblique images or lateral view. Again noted is cardiomegaly with enlarged central vascular structures. Findings are suggestive for vascular congestion or mild edema. Electronically Signed   By: Markus Daft M.D.   On: 06/15/2015 17:08    CBC  Recent Labs Lab 06/13/15 0322 06/14/15 0540 06/15/15 0530 06/16/15 0510 06/17/15 0445 06/18/15 0455  WBC 9.4 5.4 4.9 5.8 7.1 6.6  HGB 9.1* 8.6* 8.7* 9.2* 8.8* 8.5*  HCT 30.2* 28.5* 28.1* 30.0* 29.3* 28.0*  PLT 152 121* 118* 133* 143* 136*  MCV 94.7 95.0 94.9 94.0 95.1 94.9  MCH 28.5 28.7 29.4 28.8 28.6 28.8  MCHC 30.1 30.2 31.0 30.7 30.0 30.4  RDW 17.9* 18.0* 17.8* 17.7* 18.1* 18.0*  LYMPHSABS 1.1  --   --   --   --   --   MONOABS 0.7  --   --   --   --   --   EOSABS 0.0  --   --   --   --   --   BASOSABS 0.0  --   --   --   --   --     Chemistries   Recent Labs Lab 06/14/15 0540 06/15/15 0530 06/16/15 0510 06/17/15 0445 06/18/15 0455  NA 141 142 140 142 140  K 4.7 4.6 3.9 3.8 3.5  CL 108 107 102 100* 96*  CO2 25 25 27 31  32  GLUCOSE 107* 113* 112* 86 93  BUN 45* 49* 50* 56* 54*  CREATININE 1.93* 1.87* 1.84* 1.88* 1.81*  CALCIUM 9.2 9.6 9.5 9.8 9.3  MG  --  1.9  --  1.7  --    ------------------------------------------------------------------------------------------------------------------ estimated creatinine clearance  is 49.1 mL/min (by C-G formula based on Cr of 1.81). ------------------------------------------------------------------------------------------------------------------ No results for input(s): HGBA1C in the last 72 hours. ------------------------------------------------------------------------------------------------------------------ No results for input(s): CHOL, HDL, LDLCALC, TRIG, CHOLHDL, LDLDIRECT in the last 72 hours. ------------------------------------------------------------------------------------------------------------------ No results for  input(s): TSH, T4TOTAL, T3FREE, THYROIDAB in the last 72 hours.  Invalid input(s): FREET3 ------------------------------------------------------------------------------------------------------------------ No results for input(s): VITAMINB12, FOLATE, FERRITIN, TIBC, IRON, RETICCTPCT in the last 72 hours.  Coagulation profile No results for input(s): INR, PROTIME in the last 168 hours.  No results for input(s): DDIMER in the last 72 hours.  Cardiac Enzymes  Recent Labs Lab 06/13/15 0322 06/13/15 0916 06/13/15 1457  TROPONINI 0.03 0.03 0.03   ------------------------------------------------------------------------------------------------------------------ Invalid input(s): POCBNP   Recent Labs  06/16/15 2051 06/17/15 0730 06/17/15 1119 06/17/15 1630 06/17/15 2156 06/18/15 0728  GLUCAP 98 76 119* 114* 38 84     RAI,RIPUDEEP M.D. [facility] 06/18/2015, 12:52 PM  Pager: [phone] Between 7am to 7pm - call Pager - [phone]  After 7pm go to [email or web address] - password [email or web address]  Call night coverage person covering after 7pm

## 2015-06-18 NOTE — Progress Notes (Signed)
UR Completed Morna Flud Graves-Bigelow, RN,BSN [phone]  

## 2015-06-18 NOTE — Progress Notes (Signed)
Pt had 5 beat run of vtach. Pt asymptomatic at thistime. VSS. Darrick Grinder, NP notified. Will continue to monitor.    Ruben Reason, RN

## 2015-06-18 NOTE — Clinical Social Work Note (Signed)
Clinical Social Work Assessment  Patient Details  Name: Robyn Porter MRN: [id number] Date of Birth: [birth year]  Date of referral:  06/18/15               Reason for consult:  Facility Placement, Discharge Planning                Permission sought to share information with:  Facility Sport and exercise psychologist, Family Supports Permission granted to share information::  Yes, Verbal Permission Granted  Name::     Drucie Opitz  Agency::  SNF  Relationship::  [employer] Information:  [phone]  Housing/Transportation Living arrangements for the past 2 months:  [place], [place] of Information:  Patient Patient Interpreter Needed:  None Criminal Activity/Legal Involvement Pertinent to Current Situation/Hospitalization:  No - Comment as needed Significant Relationships:  Other Family Members (Nephew and his wife) Lives with:  Self, Relatives, Facility Resident Do you feel safe going back to the place where you live?  Yes Need for family participation in patient care:  Yes (Comment)  Care giving concerns:  Patient will likely need SNF for short-term rehab once medically ready for discharge.    Social Worker assessment / plan:  BSW intern met with patient at bedside to complete assessment. Patient stated that she was from home when she was admitted to the hospital. Patient lives at home with her nephew and his wife. Patient stated that she had been a resident at [facility] back in November or December and was there for one month. Patient expressed that she has been using [facility] services since her discharge from the SNF. BSW intern explained that with the patient insurance, it may be difficult to find a SNF bed and an LOG may be necessary. Patient seemed to understand. Patient requested [facility] as a SNF option with a private room. BSW intern explained that may be hard because private rooms are hard to come by. During assessment the  patient was very pleasant and cooperative. BSW intern will continue to follow and assist as needed.  Employment status:  Disabled (Comment on whether or not currently receiving Disability) Insurance information:  Public librarian) PT Recommendations:  [place] / Referral to community resources:  [place]  Patient/Family's Response to care:  Patient appeared to be happy with the care she has been receiving.  Patient/Family's Understanding of and Emotional Response to Diagnosis, Current Treatment, and Prognosis:  Patient seems to understand her reason for admission, current treatment, and post discharge needs.  Emotional Assessment Appearance:  Appears stated age Attitude/Demeanor/Rapport:   (Appropriate) Affect (typically observed):  Hopeful, Pleasant, Appropriate Orientation:  Oriented to Self, Oriented to Place, Oriented to  Time, Oriented to Situation Alcohol / Substance use:  Not Applicable Psych involvement (Current and /or in the community):  No (Comment)  Discharge Needs  Concerns to be addressed:  No discharge needs identified Readmission within the last 30 days:  No Current discharge risk:  Dependent with Mobility Barriers to Discharge:  Continued Medical Work up    [employer], [phone]

## 2015-06-19 LAB — BASIC METABOLIC PANEL
Anion gap: 13 (ref 5–15)
BUN: 53 mg/dL — AB (ref 6–20)
CALCIUM: 9.2 mg/dL (ref 8.9–10.3)
CO2: 34 mmol/L — ABNORMAL HIGH (ref 22–32)
CREATININE: 1.85 mg/dL — AB (ref 0.44–1.00)
Chloride: 94 mmol/L — ABNORMAL LOW (ref 101–111)
GFR, EST AFRICAN AMERICAN: 32 mL/min — AB (ref >60)
GFR, EST NON AFRICAN AMERICAN: 27 mL/min — AB (ref >60)
Glucose, Bld: 93 mg/dL (ref 65–99)
Potassium: 3.4 mmol/L — ABNORMAL LOW (ref 3.5–5.1)
SODIUM: 141 mmol/L (ref 135–145)

## 2015-06-19 LAB — GLUCOSE, CAPILLARY
GLUCOSE-CAPILLARY: 89 mg/dL (ref 65–99)
GLUCOSE-CAPILLARY: 116 mg/dL — AB (ref 65–99)
GLUCOSE-CAPILLARY: 134 mg/dL — AB (ref 65–99)
GLUCOSE-CAPILLARY: 115 mg/dL — AB (ref 65–99)

## 2015-06-19 LAB — CBC
HCT: 27.9 % — ABNORMAL LOW (ref 36.0–46.0)
Hemoglobin: 8.5 g/dL — ABNORMAL LOW (ref 12.0–15.0)
MCH: 28.9 pg (ref 26.0–34.0)
MCHC: 30.5 g/dL (ref 30.0–36.0)
MCV: 94.9 fL (ref 78.0–100.0)
PLATELETS: 144 10*3/uL — AB (ref 150–400)
RBC: 2.94 MIL/uL — AB (ref 3.87–5.11)
RDW: 17.9 % — AB (ref 11.5–15.5)
WBC: 6.6 10*3/uL (ref 4.0–10.5)

## 2015-06-19 MED ORDER — POTASSIUM CHLORIDE CRYS ER 20 MEQ PO TBCR
40.0000 meq | EXTENDED_RELEASE_TABLET | Freq: Two times a day (BID) | ORAL | Status: AC
Start: 2015-06-19 — End: 2015-06-19
  Administered 2015-06-19 (×2): 40 meq via ORAL
  Filled 2015-06-19 (×2): qty 2

## 2015-06-19 MED ORDER — METOLAZONE 5 MG PO TABS
5.0000 mg | ORAL_TABLET | Freq: Every day | ORAL | Status: DC
  Administered 2015-06-19 – 2015-06-23 (×5): 5 mg via ORAL
  Filled 2015-06-19 (×4): qty 1

## 2015-06-19 NOTE — Plan of Care (Signed)
Problem: Safety: Goal: Ability to remain free from injury will improve Outcome: Completed/Met Date Met:  06/19/15 Pt educated on safety measures put into place. Pt verbalized understanding.      

## 2015-06-19 NOTE — Progress Notes (Signed)
[facility]                                                                              Patient Demographics  Robyn Porter, is a 65 y.o. female, DOB - [birth year], [id number]  Admit date - 06/12/2015   Admitting Physician [name], MD  Outpatient Primary MD for the patient is ADDIS,DANIEL, DO  LOS - 6   Chief Complaint  Patient presents with  . Shortness of Breath       Brief HPI    Robyn Porter is a 65 y.o. female with history of diastolic CHF last EF measured in November 2016 was 50-55% presented to the ER because of increasing shortness of breath and increased weight. Patient states that over the last 1 month patient has been noticing increasing shortness of breath both on sitting and exertion. Denied any chest pain. Patient's Lasix was recently increased to 20 mg twice a day. Patient reported that she had been to the [facility] ER at least twice over the last 1 month. Patient was admitted at [facility] in November for Streptococcus bacteremia secondary to cellulitis at that time patient had 2-D echo and transesophageal echocardiogram which showed EF of 50-55%. At this time chest x-ray shows congestion and patient has been placed on Lasix and admitted for CHF.   Patient was placed on aggressive diuresis, cardiology was consulted. Currently management per CHF team, on Lasix drip and metolazone. Overall improving, will need skilled nursing facility when cleared by cardiology.   Assessment & Plan    Principal Problem:   Acute on chronic diastolic CHF (congestive heart failure), NYHA class 1 ([place]): BNP 940 - she reported that she was 383 lbs on 12/18 when she was discharged from [facility]. Currently 419lbs from 423 lbs, massively volume overloaded.   -  on IV Lasix infusion with metolazone for aggressive diuresis. - 2-D echo showed EF of 40-45% with 85% RV pacing, moderate pulmonary hypertension, per cardiology RV  pacing may be contributing to worsening of EF. No plans of noninvasive ischemia workup as it would be limited by body habitus. - Continue losartan, placed on Toprol-XL - Negative balance of 24.3L, weight 423 lbs down to 366 lbs   Active Problems: Acute bronchitis/COPD- improving  - Placed on DuoNeb's 4 times a day, Symbicort  Proteus mirabilis UTI - Urine culture showed Proteus mirabilis UTI, was placed on Rocephin IV for 3 days, follow closely for any diarrhea - Patient reported that she was admitted in [facility] last month and had C. difficile positive  - added probiotics    Chronic atrial fibrillation ([place]) - Rate controlled, continue metoprolol - [place] vasc score 4, continue xarelto    Diabetes mellitus type 2 in obese ([place]) - Placed on sliding scale insulin, follow CBGs    History of pacemaker    Chronic anemia - anemia panel consistent with anemia of chronic disease     OSA (obstructive sleep apnea) Continue CPAP   ? Chronic kidney disease , likely chronic kidney disease stage III - Baseline not available , creatinine stable  Code Status: Full CODE STATUS  Family Communication: Discussed in detail with the patient, all imaging results, lab results explained to the patient   Disposition Plan: Will need skilled nursing facility, once cleared by cardiology  Time Spent in minutes   15 minutes  Procedures  None   Consult cardiology  DVT Prophylaxis  rivaroxaban  Medications  Scheduled Meds: . allopurinol  100 mg Oral Daily  . antiseptic oral rinse  7 mL Mouth Rinse BID  . atorvastatin  20 mg Oral Daily  . budesonide-formoterol  2 puff Inhalation BID  . cholecalciferol  5,000 Units Oral Daily  . ferrous sulfate  650 mg Oral BID WC  . insulin aspart  0-9 Units Subcutaneous TID WC  . ipratropium-albuterol  3 mL Nebulization TID  . loratadine  10 mg Oral Daily  . losartan  50 mg Oral Daily  . metolazone  5 mg Oral Daily  . metoprolol succinate  50 mg  Oral Daily  . multivitamin with minerals  1 tablet Oral Daily  . rivaroxaban  20 mg Oral Q supper  . saccharomyces boulardii  250 mg Oral BID  . sodium chloride flush  10-40 mL Intracatheter Q12H  . sodium chloride flush  3 mL Intravenous Q12H  . cyanocobalamin  500 mcg Oral Daily  . vitamin C  500 mg Oral Daily   Continuous Infusions: . furosemide (LASIX) infusion 10 mg/hr (06/19/15 0717)   PRN Meds:.acetaminophen **OR** acetaminophen, albuterol, benzonatate, ondansetron **OR** ondansetron (ZOFRAN) IV, sodium chloride flush   Antibiotics   Anti-infectives    Start     Dose/Rate Route Frequency Ordered Stop   06/15/15 1230  cefTRIAXone (ROCEPHIN) 1 g in dextrose 5 % 50 mL IVPB     1 g 100 mL/hr over 30 Minutes Intravenous Every 24 hours 06/15/15 1149 06/17/15 1317   06/13/15 0915  cefTRIAXone (ROCEPHIN) 1 g in dextrose 5 % 50 mL IVPB  Status:  Discontinued     1 g 100 mL/hr over 30 Minutes Intravenous Every 24 hours 06/13/15 0912 06/13/15 0915        Subjective:   Robyn Porter was seen and examined today. No complaints today. No coughing. Shortness of breath is improving. No chest pain.  Patient denies abdominal pain, N/V/D/C, new weakness, numbess, tingling. No acute events overnight.    Objective:   Filed Vitals:   06/19/15 0506 06/19/15 0757 06/19/15 0845 06/19/15 1128  BP: 118/50 107/49  109/46  Pulse: 60 25 72 48  Temp: 98.7 F (37.1 C)     TempSrc: Oral     Resp: 15 21 20 17   Height:      Weight: 166.017 kg (366 lb)     SpO2: 96% 98% 100% 99%    Intake/Output Summary (Last 24 hours) at 06/19/15 1358 Last data filed at 06/19/15 1213  Gross per 24 hour  Intake   1410 ml  Output   5075 ml  Net  -3665 ml     Wt Readings from Last 3 Encounters:  06/19/15 166.017 kg (366 lb)     Exam  General: Alert and oriented x 3, NAD,   HEENT:    Neck: Supple, +JVD  CVS: S1 S2 clear, regular rate and rhythm, 1/6SEM  Respiratory: Clear to auscultation  bilaterally  Abdomen :  morbidly obese Soft, nontender, distended, + bowel sounds  Ext: no cyanosis clubbing, trace -1+ edema, lymphedema   Neuro: no new deficit   Skin: No rashes  Psych: Normal affect and demeanor, alert and oriented x3  Data Review   Micro Results Recent Results (from the past 240 hour(s))  Urine culture     Status: None   Collection Time: 06/13/15  1:34 AM  Result Value Ref Range Status   Specimen Description URINE, CATHETERIZED  Final   Special Requests NONE  Final   Culture >=100,000 COLONIES/mL PROTEUS MIRABILIS  Final   Report Status 06/15/2015 FINAL  Final   Organism ID, Bacteria PROTEUS MIRABILIS  Final      Susceptibility   Proteus mirabilis - MIC*    AMPICILLIN >=32 RESISTANT Resistant     CEFAZOLIN <=4 SENSITIVE Sensitive     CEFTRIAXONE <=1 SENSITIVE Sensitive     CIPROFLOXACIN >=4 RESISTANT Resistant     GENTAMICIN <=1 SENSITIVE Sensitive     IMIPENEM 2 SENSITIVE Sensitive     NITROFURANTOIN 128 RESISTANT Resistant     TRIMETH/SULFA >=320 RESISTANT Resistant     AMPICILLIN/SULBACTAM 4 SENSITIVE Sensitive     PIP/TAZO <=4 SENSITIVE Sensitive     * >=100,000 COLONIES/mL PROTEUS MIRABILIS    Radiology Reports Dg Chest 2 View  06/12/2015  CLINICAL DATA:  65 year old female with shortness of breath and cough for the past month EXAM: CHEST  2 VIEW COMPARISON:  None. FINDINGS: Left subclavian approach single lead cardiac rhythm maintenance device with the lead projected over the right ventricle. Cardiomegaly. Pulmonary vascular congestion with mild interstitial edema. Small bilateral pleural effusions with associated bibasilar atelectasis. No acute osseous abnormality. IMPRESSION: Mild-moderate CHF. Electronically Signed   By: Jacqulynn Cadet M.D.   On: 06/12/2015 20:23   Dg Chest Port 1 View  06/15/2015  CLINICAL DATA:  Evaluate line placement. EXAM: PORTABLE CHEST 1 VIEW COMPARISON:  06/12/2015 FINDINGS: Again noted is a single lead left  cardiac pacemaker. Placement of a right arm PICC line. The PICC catheter extends into the SVC but the tip is not well visualized. Heart is enlarged. Again noted are prominent central vascular structures. Trachea is midline. Negative for a pneumothorax. IMPRESSION: PICC line extends into the SVC region but the tip is not well visualized. Consider further evaluation with oblique images or lateral view. Again noted is cardiomegaly with enlarged central vascular structures. Findings are suggestive for vascular congestion or mild edema. Electronically Signed   By: Markus Daft M.D.   On: 06/15/2015 17:08    CBC  Recent Labs Lab 06/13/15 0322  06/15/15 0530 06/16/15 0510 06/17/15 0445 06/18/15 0455 06/19/15 0520  WBC 9.4  < > 4.9 5.8 7.1 6.6 6.6  HGB 9.1*  < > 8.7* 9.2* 8.8* 8.5* 8.5*  HCT 30.2*  < > 28.1* 30.0* 29.3* 28.0* 27.9*  PLT 152  < > 118* 133* 143* 136* 144*  MCV 94.7  < > 94.9 94.0 95.1 94.9 94.9  MCH 28.5  < > 29.4 28.8 28.6 28.8 28.9  MCHC 30.1  < > 31.0 30.7 30.0 30.4 30.5  RDW 17.9*  < > 17.8* 17.7* 18.1* 18.0* 17.9*  LYMPHSABS 1.1  --   --   --   --   --   --   MONOABS 0.7  --   --   --   --   --   --   EOSABS 0.0  --   --   --   --   --   --   BASOSABS 0.0  --   --   --   --   --   --   < > = values in this interval not displayed.  Chemistries   Recent Labs Lab 06/15/15 0530 06/16/15 0510 06/17/15 0445 06/18/15 0455 06/19/15 0520  NA 142 140 142 140 141  K 4.6 3.9 3.8 3.5 3.4*  CL 107 102 100* 96* 94*  CO2 25 27 31  32 34*  GLUCOSE 113* 112* 86 93 93  BUN 49* 50* 56* 54* 53*  CREATININE 1.87* 1.84* 1.88* 1.81* 1.85*  CALCIUM 9.6 9.5 9.8 9.3 9.2  MG 1.9  --  1.7  --   --    ------------------------------------------------------------------------------------------------------------------ estimated creatinine clearance is 46.8 mL/min (by C-G formula based on Cr of  1.85). ------------------------------------------------------------------------------------------------------------------ No results for input(s): HGBA1C in the last 72 hours. ------------------------------------------------------------------------------------------------------------------ No results for input(s): CHOL, HDL, LDLCALC, TRIG, CHOLHDL, LDLDIRECT in the last 72 hours. ------------------------------------------------------------------------------------------------------------------ No results for input(s): TSH, T4TOTAL, T3FREE, THYROIDAB in the last 72 hours.  Invalid input(s): FREET3 ------------------------------------------------------------------------------------------------------------------ No results for input(s): VITAMINB12, FOLATE, FERRITIN, TIBC, IRON, RETICCTPCT in the last 72 hours.  Coagulation profile No results for input(s): INR, PROTIME in the last 168 hours.  No results for input(s): DDIMER in the last 72 hours.  Cardiac Enzymes  Recent Labs Lab 06/13/15 0322 06/13/15 0916 06/13/15 1457  TROPONINI 0.03 0.03 0.03   ------------------------------------------------------------------------------------------------------------------ Invalid input(s): POCBNP   Recent Labs  06/17/15 2156 06/18/15 0728 06/18/15 1619 06/18/15 2135 06/19/15 0747 06/19/15 1125  GLUCAP 98 85 115* 110* 18 116*     RAI,RIPUDEEP M.D. [facility] 06/19/2015, 1:58 PM  Pager: [phone] Between 7am to 7pm - call Pager - [phone]  After 7pm go to [email or web address] - password [email or web address]  Call night coverage person covering after 7pm

## 2015-06-19 NOTE — Progress Notes (Signed)
Patient ID: Robyn Porter, female   DOB: [birth year], 65 y.o.   MRN: [id number]      Subjective:    Feeling better. Weight coming down. Out 23 L overall. Bed weights inaccurate. Working with PT. Creatinine stable.  K 3.4  Objective:   Temp:  [98 F (36.7 C)-98.7 F (37.1 C)] 98.7 F (37.1 C) (02/21 0506) Pulse Rate:  [47-72] 60 (02/21 0506) Resp:  [15-23] 15 (02/21 0506) BP: (93-118)/(41-53) 118/50 mmHg (02/21 0506) SpO2:  [95 %-100 %] 96 % (02/21 0506) Weight:  [366 lb (166.017 kg)] 366 lb (166.017 kg) (02/21 0506) Last BM Date: 06/18/15  Filed Weights   06/17/15 0442 06/18/15 0400 06/19/15 0506  Weight: 388 lb (175.996 kg) 380 lb (172.367 kg) 366 lb (166.017 kg)    Intake/Output Summary (Last 24 hours) at 06/19/15 0726 Last data filed at 06/19/15 0500  Gross per 24 hour  Intake   1430 ml  Output   4875 ml  Net  -3445 ml    Telemetry: Reviewed personally, V-paced 60s  Exam: CVP: 13-14 General:Morbidly Obese.  Pleasant, NAD. No resp difficulty Psych: Normal affect. HEENT: Normal, without mass or lesion. Neck: Supple, no bruits or JVD. Carotids 2+. No lymphadenopathy/thyromegaly appreciated. Heart: PMI nondisplaced. RRR. 1/6 SEM RUSB, distant heart sounds Lungs: Resp regular and unlabored, CTA. Abdomen: Soft, non-tender, non-distended, No HSM, BS + x 4.  Extremities: No clubbing, cyanosis. DP/PT/Radials 2+ and equal bilaterally. 1-2+ edema into thighs.  Neuro: Alert and oriented X 3. Moves all extremities spontaneously.   Basic Metabolic Panel:  Recent Labs Lab 06/15/15 0530  06/17/15 0445 06/18/15 0455 06/19/15 0520  NA 142  < > 142 140 141  K 4.6  < > 3.8 3.5 3.4*  CL 107  < > 100* 96* 94*  CO2 25  < > 31 32 34*  GLUCOSE 113*  < > 86 93 93  BUN 49*  < > 56* 54* 53*  CREATININE 1.87*  < > 1.88* 1.81* 1.85*  CALCIUM 9.6  < > 9.8 9.3 9.2  MG 1.9  --  1.7  --   --   < > = values in this interval not displayed.  Liver Function Tests: No  results for input(s): AST, ALT, ALKPHOS, BILITOT, PROT, ALBUMIN in the last 168 hours.  CBC:  Recent Labs Lab 06/17/15 0445 06/18/15 0455 06/19/15 0520  WBC 7.1 6.6 6.6  HGB 8.8* 8.5* 8.5*  HCT 29.3* 28.0* 27.9*  MCV 95.1 94.9 94.9  PLT 143* 136* 144*    Cardiac Enzymes:  Recent Labs Lab 06/13/15 0322 06/13/15 0916 06/13/15 1457  TROPONINI 0.03 0.03 0.03    BNP: No results for input(s): PROBNP in the last 8760 hours.  Coagulation: No results for input(s): INR in the last 168 hours.  ECG:   Medications:   Scheduled Medications: . allopurinol  100 mg Oral Daily  . antiseptic oral rinse  7 mL Mouth Rinse BID  . atorvastatin  20 mg Oral Daily  . budesonide-formoterol  2 puff Inhalation BID  . cholecalciferol  5,000 Units Oral Daily  . ferrous sulfate  650 mg Oral BID WC  . insulin aspart  0-9 Units Subcutaneous TID WC  . ipratropium-albuterol  3 mL Nebulization TID  . loratadine  10 mg Oral Daily  . losartan  50 mg Oral Daily  . metolazone  2.5 mg Oral Daily  . metoprolol succinate  50 mg Oral Daily  . multivitamin with minerals  1 tablet Oral Daily  .  rivaroxaban  20 mg Oral Q supper  . saccharomyces boulardii  250 mg Oral BID  . sodium chloride flush  10-40 mL Intracatheter Q12H  . sodium chloride flush  3 mL Intravenous Q12H  . cyanocobalamin  500 mcg Oral Daily  . vitamin C  500 mg Oral Daily    Infusions: . furosemide (LASIX) infusion 10 mg/hr (06/19/15 0717)    PRN Medications: acetaminophen **OR** acetaminophen, albuterol, benzonatate, ondansetron **OR** ondansetron (ZOFRAN) IV, sodium chloride flush     Assessment/Plan   Robyn Porter is a 65 y.o. female with history of diastolic CHF LVEF 99991111 in [place] 02/2015, COPD, and Asthma who presented to [facility] 06/12/15 with worsening SOB for several day  1. Acute on chronic systolic CHF  - Echo 99991111 shows EF 40-45%, down from 50-55% reportedly in 02/2015.  - Volume status improving but  remains markedly volume overloaded. Continue lasix drip 10 per hour. Will give 5 mg metolazone today.   - Supp K.  - Renal function stable.  - Place UNNA boots. 2. S/p Medtronic PPM - Interrogated by Medtronic, single chamber pacer, pacing 85% of the time. Battery and leads WNLs. - No complications.  3. Chronic Afib - Rate controlled on toprol.  - Continue Xarelto - This patients CHA2DS2-VASc Score and unadjusted Ischemic Stroke Rate (4.8 % per year)  from a score of 4 4. COPD - Stable. Per primary - Not a former smoker, though long exposure to second hand. - ?OHS Not on chronic 02.  5. OSA/ ? OHS component - Continue CPAP nightly  6. CKD stage III - Stable in range of 1.8 -1.9 - Follow closely with diuresis.   Satira Mccallum Tillery PA-C 7:26 AM   Advanced Heart Failure Team Pager [phone] (M-F; 7a - 4p)  Please contact [facility] for night-coverage after hours (4p -7a ) and weekends on [email or web address]   She diuresed well again yesterday. Still volume overloaded with elevated CVP.  - Agree with continuing Lasix infusion and metolazone 5 mg x 1.  Replace K.   EF 40-45% with 85% RV pacing. RV pacing may be contributing to worsening of EF. In absence of chest pain, would avoid invasive ischemic workup given CKD. Non-invasive workup (cardiolite) would be limited by body habitus.  - Continue losartan 50 daily as long as creatinine is stable.  - Stopped metoprolol tartrate and using Toprol XL 50 mg daily.  - Will see if there's any way to lower RV pacing percentage, will ask Medtronic.   Chronic afib, continue Xarelto.   Suspect OHS/OSA.   [name] 06/19/2015 7:46 AM

## 2015-06-19 NOTE — Progress Notes (Signed)
Occupational Therapy Treatment Patient Details Name: Gao Blauer MRN: [id number] DOB: 1950/10/02 Today's Date: 06/19/2015    History of present illness Andraea Leggio is a 65 y.o. female with history of diastolic CHF last EF measured in November 2016 was 50-55% presents to the ER because of increasing shortness of breath and increased weight. Patient states that over the last 1 month patient has been noticing increasing shortness of breath both on sitting and exertion. Denies any chest pain   OT comments  Pt eager to participate, educated and worked on therex exercises for the shoulders in sitting during session.  Also completed toilet transfer to the wide [facility] with mod facilitation and need for RW.  Will continue to need SNF for further rehab at this time.   Follow Up Recommendations  SNF;Supervision/Assistance - 24 hour    Equipment Recommendations  Other (comment) (TBD next venue of care)    Recommendations for Other Services      Precautions / Restrictions Precautions Precautions: Fall;ICD/Pacemaker Precaution Comments: 400+ lbs, monitor O2 Restrictions Weight Bearing Restrictions: No       Mobility Bed Mobility                  Transfers       Sit to Stand: Mod assist Stand pivot transfers: Mod assist       General transfer comment: Pt needed 2 attempts to complete sit to stand from bedside chair.    Balance Overall balance assessment: Needs assistance   Sitting balance-Leahy Scale: Good       Standing balance-Leahy Scale: Poor                     ADL                           Toilet Transfer: Moderate assistance;Ambulation;BSC;RW           Functional mobility during ADLs: Moderate assistance;Rolling walker General ADL Comments: Pt educated on UE therex exercises for strengthening the shoulder and rotator cuff.  See exercise section for details.  Therapist assisted pt to [facility] with use of RW at conclusion of session.  NT  notified in order to assist pt once she calls.                 Cognition   Behavior During Therapy: WFL for tasks assessed/performed Overall Cognitive Status: Within Functional Limits for tasks assessed                         Exercises General Exercises - Upper Extremity Shoulder Flexion: Strengthening;Both;10 reps;Theraband;Seated Theraband Level (Shoulder Flexion): Level 1 (Yellow) Shoulder ABduction: Both;Strengthening;10 reps;Theraband;Seated Theraband Level (Shoulder Abduction): Level 1 (Yellow) Shoulder Horizontal ADduction: Strengthening;Seated;Both;10 reps;Theraband Theraband Level (Shoulder Horizontal Adduction): Level 1 (Yellow) Shoulder Exercises Shoulder External Rotation: Strengthening;Both;10 reps;Theraband;Seated Theraband Level (Shoulder External Rotation): Level 1 (Yellow)           Pertinent Vitals/ Pain       Pain Assessment: No/denies pain         Frequency Min 2X/week     Progress Toward Goals  OT Goals(current goals can now be found in the care plan section)  Progress towards OT goals: Progressing toward goals     Plan Discharge plan remains appropriate       End of Session Equipment Utilized During Treatment: Oxygen;Rolling walker   Activity Tolerance Patient tolerated treatment well   Patient Left  (  on wide BSC )   Nurse Communication Other (comment) (NT notified and pt to push the call [place] when she is ready to be assisted with toilet hygiene)        Time: [id number] OT Time Calculation (min): 30 min  Charges: OT General Charges $OT Visit: 1 Procedure OT Treatments $Therapeutic Exercise: 23-37 mins (30 mins)  Leslee Haueter OTR/L 06/19/2015, 4:12 PM

## 2015-06-19 NOTE — Progress Notes (Signed)
Patient refusing to wear BIPAP stating she can not tolerate wearing the mask. Patient is currently on 4LNC with sats of 93% and is in no distress. Will monitor as needed.

## 2015-06-19 NOTE — Progress Notes (Signed)
Orthopedic Tech Progress Note Patient Details:  Robyn Porter 01/07/1951 [id number]  Ortho Devices Type of Ortho Device: Louretta Parma boot Ortho Device/Splint Location: Bilateral unna boots Ortho Device/Splint Interventions: Application   [name] 06/19/2015, 11:39 AM

## 2015-06-19 NOTE — Progress Notes (Signed)
Placed patient on home CPAP for the night with oxygen set at 2lpm  

## 2015-06-19 NOTE — Care Management Note (Addendum)
Case Management Note  Patient Details  Name: Robyn Porter MRN: [id number] Date of Birth: [birth year]  Subjective/Objective:     Pt admitted for SOB- Acute on Chronic Diastolic Heart Failure. Continues on IV lasix gtt.                Action/Plan: PT recommendations for SNF. CSW did speak with pt and pt is agreeable. Looking at [facility]. CM will continue to monitor.    Expected Discharge Date:                  Expected Discharge Plan:  Skilled Nursing Facility  In-House Referral:  Clinical Social Work  Discharge planning Services  CM Consult  Post Acute Care Choice:  NA Choice offered to:  NA  DME Arranged:  N/A DME Agency:  NA  HH Arranged:  NA HH Agency:  NA  Status of Service:  Completed, signed off  Medicare Important Message Given:    Date Medicare IM Given:    Medicare IM give by:    Date Additional Medicare IM Given:    Additional Medicare Important Message give by:     If discussed at [place] of Stay Meetings, dates discussed:    Additional Comments: 06-22-15 [id number] [name], RN,BSN [phone] CM did speak with CSW and pt has a bed at [facility]. No further needs from CM at this time.   [name], RN 06/19/2015, 10:52 AM

## 2015-06-19 NOTE — Plan of Care (Signed)
Problem: Fluid Volume: Goal: Ability to maintain a balanced intake and output will improve Outcome: Completed/Met Date Met:  06/19/15 Pt educated on fluid restrictions. Pt verbalized understanding. Monitoring input and output.

## 2015-06-19 NOTE — Plan of Care (Signed)
Problem: Education: Goal: Ability to verbalize understanding of medication therapies will improve Outcome: Completed/Met Date Met:  06/19/15 Pt educated on medications. Pt verbalized understanding.

## 2015-06-20 LAB — GLUCOSE, CAPILLARY
GLUCOSE-CAPILLARY: 86 mg/dL (ref 65–99)
Glucose-Capillary: 119 mg/dL — ABNORMAL HIGH (ref 65–99)
Glucose-Capillary: 98 mg/dL (ref 65–99)
Glucose-Capillary: 90 mg/dL (ref 65–99)

## 2015-06-20 LAB — MAGNESIUM: MAGNESIUM: 1.6 mg/dL — AB (ref 1.7–2.4)

## 2015-06-20 LAB — BASIC METABOLIC PANEL
ANION GAP: 14 (ref 5–15)
BUN: 52 mg/dL — AB (ref 6–20)
CALCIUM: 9.5 mg/dL (ref 8.9–10.3)
CO2: 35 mmol/L — ABNORMAL HIGH (ref 22–32)
CREATININE: 1.79 mg/dL — AB (ref 0.44–1.00)
Chloride: 93 mmol/L — ABNORMAL LOW (ref 101–111)
GFR calc Af Amer: 33 mL/min — ABNORMAL LOW (ref >60)
GFR, EST NON AFRICAN AMERICAN: 28 mL/min — AB (ref >60)
GLUCOSE: 93 mg/dL (ref 65–99)
Potassium: 3.6 mmol/L (ref 3.5–5.1)
Sodium: 142 mmol/L (ref 135–145)

## 2015-06-20 LAB — CBC
HCT: 27.6 % — ABNORMAL LOW (ref 36.0–46.0)
HEMOGLOBIN: 8.4 g/dL — AB (ref 12.0–15.0)
MCH: 28.9 pg (ref 26.0–34.0)
MCHC: 30.4 g/dL (ref 30.0–36.0)
MCV: 94.8 fL (ref 78.0–100.0)
PLATELETS: 137 10*3/uL — AB (ref 150–400)
RBC: 2.91 MIL/uL — ABNORMAL LOW (ref 3.87–5.11)
RDW: 17.7 % — AB (ref 11.5–15.5)
WBC: 5.5 10*3/uL (ref 4.0–10.5)

## 2015-06-20 MED ORDER — MAGNESIUM SULFATE 2 GM/50ML IV SOLN
2.0000 g | Freq: Once | INTRAVENOUS | Status: AC
  Administered 2015-06-20: 2 g via INTRAVENOUS
  Filled 2015-06-20: qty 50

## 2015-06-20 MED ORDER — POTASSIUM CHLORIDE CRYS ER 20 MEQ PO TBCR
40.0000 meq | EXTENDED_RELEASE_TABLET | Freq: Two times a day (BID) | ORAL | Status: AC
  Administered 2015-06-20 (×2): 40 meq via ORAL
  Filled 2015-06-20 (×2): qty 2

## 2015-06-20 MED ORDER — LOSARTAN POTASSIUM 25 MG PO TABS
25.0000 mg | ORAL_TABLET | Freq: Every day | ORAL | Status: DC
  Administered 2015-06-21: 25 mg via ORAL
  Filled 2015-06-20 (×2): qty 1

## 2015-06-20 NOTE — Progress Notes (Signed)
CARDIAC REHAB PHASE I  Pt currently with very limited mobility. Will follow PT progress. Pt not appropriate for cardiac rehab at this time. PT to evaluate need for CIR.   Lenna Sciara, RN, BSN 06/20/2015 7:49 AM

## 2015-06-20 NOTE — Progress Notes (Signed)
[facility]                                                                              Patient Demographics  Robyn Porter, is a 65 y.o. female, DOB - [birth year], [id number]  Admit date - 06/12/2015   Admitting Physician [name], MD  Outpatient Primary MD for the patient is ADDIS,DANIEL, DO  LOS - 7   Chief Complaint  Patient presents with  . Shortness of Breath      HPI on 06/13/15 on [name] is a 65 y.o. female with history of diastolic CHF last EF measured in November 2016 was 50-55% presents to the ER because of increasing shortness of breath and increased weight. Patient states that over the last 1 month patient has been noticing increasing shortness of breath both on sitting and exertion. Denies any chest pain. Patient's Lasix was recently increased to 20 mg twice a day. Patient states she has been to the ER at least twice over the last 1 month. Patient was admitted at [facility] in November for Streptococcus bacteremia secondary to cellulitis at that time patient had 2-D echo and transesophageal echocardiogram which showed EF of 50-55%. At this time chest x-ray shows congestion and patient has been placed on Lasix and admitted for CHF. Patient denies any nausea vomiting abdominal pain or diarrhea.   Assessment & Plan   Acute on chronic diastolic CHF  -Echocardiogram shows EF of 40-45% -Heart failure team consulted and appreciated -Continue Lasix drip, metolazone -Continue to monitor intake and output, daily weight -Urine output over the last 24 hours 4465 cc -Continue Unna boots -Monitor intake and out  Acute bronchitits/COPD  -Improving -Continue Symbicort, nebs  Proteus mirabilis UTI -Continue ceftriaxone  Chronic atrial fibrillation -CHADSVASC 4 -Continue metoprolol and Xarelto  Diabetes mellitus, type II -Continue insulin sliding scale CBG monitoring  Chronic anemia -Anemia consistent  with anemia of chronic disease -Hemoglobin currently 8.4 -Continue to monitor CBC  Obstructive sleep apnea -Continue CPAP  ?Chronic kidney disease, stage III -Unknown creatinine baseline -Creatinine 1.79, appears to be stable   History pacemaker -Interrogated by Medtr[employer], pacing 85% of the time  Deconditioning -PT consulted recommended SNF  Code Status: Full  Family Communication: None at bedside  Disposition Plan: Admitted.  Continue diuresis  Time Spent in minutes   30 minutes  Procedures  Echocardiogram  Consults   Cardiology  DVT Prophylaxis  Xarelto  Lab Results  Component Value Date   PLT 137* 06/20/2015    Medications  Scheduled Meds: . allopurinol  100 mg Oral Daily  . antiseptic oral rinse  7 mL Mouth Rinse BID  . atorvastatin  20 mg Oral Daily  . budesonide-formoterol  2 puff Inhalation BID  . cholecalciferol  5,000 Units Oral Daily  . ferrous sulfate  650 mg Oral BID WC  . insulin aspart  0-9 Units Subcutaneous TID WC  . ipratropium-albuterol  3 mL Nebulization TID  . loratadine  10 mg Oral Daily  . [START ON 06/21/2015] losartan  25 mg Oral Daily  . magnesium sulfate 1 - 4 g bolus IVPB  2 g Intravenous Once  . metolazone  5  mg Oral Daily  . metoprolol succinate  50 mg Oral Daily  . multivitamin with minerals  1 tablet Oral Daily  . rivaroxaban  20 mg Oral Q supper  . saccharomyces boulardii  250 mg Oral BID  . sodium chloride flush  10-40 mL Intracatheter Q12H  . sodium chloride flush  3 mL Intravenous Q12H  . cyanocobalamin  500 mcg Oral Daily  . vitamin C  500 mg Oral Daily   Continuous Infusions: . furosemide (LASIX) infusion 10 mg/hr (06/20/15 1338)   PRN Meds:.acetaminophen **OR** acetaminophen, albuterol, benzonatate, ondansetron **OR** ondansetron (ZOFRAN) IV, sodium chloride flush  Antibiotics    Anti-infectives    Start     Dose/Rate Route Frequency Ordered Stop   06/15/15 1230  cefTRIAXone (ROCEPHIN) 1 g in dextrose 5 % 50  mL IVPB     1 g 100 mL/hr over 30 Minutes Intravenous Every 24 hours 06/15/15 1149 06/17/15 1317   06/13/15 0915  cefTRIAXone (ROCEPHIN) 1 g in dextrose 5 % 50 mL IVPB  Status:  Discontinued     1 g 100 mL/hr over 30 Minutes Intravenous Every 24 hours 06/13/15 0912 06/13/15 0915      Subjective:   Robyn Porter seen and examined today.  Patient feels her breathing has improved. Denies chest pain abdominal pain, diarrhea or constipation.   Objective:   Filed Vitals:   06/20/15 0500 06/20/15 0506 06/20/15 0750 06/20/15 1203  BP:  106/43  81/30  Pulse:  48  62  Temp: 98.1 F (36.7 C)  98.7 F (37.1 C) 98.3 F (36.8 C)  TempSrc: Oral  Oral Oral  Resp:  16  21  Height:      Weight: 169.373 kg (373 lb 6.4 oz)     SpO2:  95%  96%    Wt Readings from Last 3 Encounters:  06/20/15 169.373 kg (373 lb 6.4 oz)     Intake/Output Summary (Last 24 hours) at 06/20/15 1454 Last data filed at 06/20/15 1311  Gross per 24 hour  Intake    800 ml  Output   3666 ml  Net  -2866 ml    Exam  General: Well developed, well nourished, NAD, appears stated age  HEENT: NCAT, mucous membranes moist.   Cardiovascular: S1 S2 auscultated, 1/6 SEM. Regular rate and rhythm.  Respiratory: Clear to auscultation bilaterally with equal chest rise  Abdomen: Soft, obese, nontender, nondistended, + bowel sounds  Extremities: warm dry without cyanosis clubbing. LE in unna boots, 2+ edema extending to thighs  Neuro: AAOx3, nonfocal  Psych: Normal affect and demeanor  Data Review   Micro Results Recent Results (from the past 240 hour(s))  Urine culture     Status: None   Collection Time: 06/13/15  1:34 AM  Result Value Ref Range Status   Specimen Description URINE, CATHETERIZED  Final   Special Requests NONE  Final   Culture >=100,000 COLONIES/mL PROTEUS MIRABILIS  Final   Report Status 06/15/2015 FINAL  Final   Organism ID, Bacteria PROTEUS MIRABILIS  Final      Susceptibility   Proteus  mirabilis - MIC*    AMPICILLIN >=32 RESISTANT Resistant     CEFAZOLIN <=4 SENSITIVE Sensitive     CEFTRIAXONE <=1 SENSITIVE Sensitive     CIPROFLOXACIN >=4 RESISTANT Resistant     GENTAMICIN <=1 SENSITIVE Sensitive     IMIPENEM 2 SENSITIVE Sensitive     NITROFURANTOIN 128 RESISTANT Resistant     TRIMETH/SULFA >=320 RESISTANT Resistant  AMPICILLIN/SULBACTAM 4 SENSITIVE Sensitive     PIP/TAZO <=4 SENSITIVE Sensitive     * >=100,000 COLONIES/mL PROTEUS MIRABILIS    Radiology Reports Dg Chest 2 View  06/12/2015  CLINICAL DATA:  65 year old female with shortness of breath and cough for the past month EXAM: CHEST  2 VIEW COMPARISON:  None. FINDINGS: Left subclavian approach single lead cardiac rhythm maintenance device with the lead projected over the right ventricle. Cardiomegaly. Pulmonary vascular congestion with mild interstitial edema. Small bilateral pleural effusions with associated bibasilar atelectasis. No acute osseous abnormality. IMPRESSION: Mild-moderate CHF. Electronically Signed   By: Jacqulynn Cadet M.D.   On: 06/12/2015 20:23   Dg Chest Port 1 View  06/15/2015  CLINICAL DATA:  Evaluate line placement. EXAM: PORTABLE CHEST 1 VIEW COMPARISON:  06/12/2015 FINDINGS: Again noted is a single lead left cardiac pacemaker. Placement of a right arm PICC line. The PICC catheter extends into the SVC but the tip is not well visualized. Heart is enlarged. Again noted are prominent central vascular structures. Trachea is midline. Negative for a pneumothorax. IMPRESSION: PICC line extends into the SVC region but the tip is not well visualized. Consider further evaluation with oblique images or lateral view. Again noted is cardiomegaly with enlarged central vascular structures. Findings are suggestive for vascular congestion or mild edema. Electronically Signed   By: Markus Daft M.D.   On: 06/15/2015 17:08    CBC  Recent Labs Lab 06/16/15 0510 06/17/15 0445 06/18/15 0455 06/19/15 0520  06/20/15 0510  WBC 5.8 7.1 6.6 6.6 5.5  HGB 9.2* 8.8* 8.5* 8.5* 8.4*  HCT 30.0* 29.3* 28.0* 27.9* 27.6*  PLT 133* 143* 136* 144* 137*  MCV 94.0 95.1 94.9 94.9 94.8  MCH 28.8 28.6 28.8 28.9 28.9  MCHC 30.7 30.0 30.4 30.5 30.4  RDW 17.7* 18.1* 18.0* 17.9* 17.7*    Chemistries   Recent Labs Lab 06/15/15 0530 06/16/15 0510 06/17/15 0445 06/18/15 0455 06/19/15 0520 06/20/15 0500 06/20/15 0510  NA 142 140 142 140 141  --  142  K 4.6 3.9 3.8 3.5 3.4*  --  3.6  CL 107 102 100* 96* 94*  --  93*  CO2 25 27 31  32 34*  --  35*  GLUCOSE 113* 112* 86 93 93  --  93  BUN 49* 50* 56* 54* 53*  --  52*  CREATININE 1.87* 1.84* 1.88* 1.81* 1.85*  --  1.79*  CALCIUM 9.6 9.5 9.8 9.3 9.2  --  9.5  MG 1.9  --  1.7  --   --  1.6*  --    ------------------------------------------------------------------------------------------------------------------ estimated creatinine clearance is 49.1 mL/min (by C-G formula based on Cr of 1.79). ------------------------------------------------------------------------------------------------------------------ No results for input(s): HGBA1C in the last 72 hours. ------------------------------------------------------------------------------------------------------------------ No results for input(s): CHOL, HDL, LDLCALC, TRIG, CHOLHDL, LDLDIRECT in the last 72 hours. ------------------------------------------------------------------------------------------------------------------ No results for input(s): TSH, T4TOTAL, T3FREE, THYROIDAB in the last 72 hours.  Invalid input(s): FREET3 ------------------------------------------------------------------------------------------------------------------ No results for input(s): VITAMINB12, FOLATE, FERRITIN, TIBC, IRON, RETICCTPCT in the last 72 hours.  Coagulation profile No results for input(s): INR, PROTIME in the last 168 hours.  No results for input(s): DDIMER in the last 72 hours.  Cardiac Enzymes  Recent  Labs Lab 06/13/15 1457  TROPONINI 0.03   ------------------------------------------------------------------------------------------------------------------ Invalid input(s): POCBNP    Yan Pankratz D.O. on 06/20/2015 at 2:54 PM  Between 7am to 7pm - Pager - [phone]  After 7pm go to [email or web address] - password [email or web address]  And look for the night coverage  person covering for me after hours  [facility] Office  [phone]

## 2015-06-20 NOTE — Plan of Care (Signed)
Problem: Pain Managment: Goal: General experience of comfort will improve Outcome: Completed/Met Date Met:  06/20/15 Pt verbalizes understanding of the pain scale and pain control

## 2015-06-20 NOTE — Progress Notes (Signed)
Mg 1.6 will give 2g.    SBP dropping into 80s after meds.  Will decrease losartan to 25 mg daily.     Legrand Como [address] J[place], PA-C 06/20/2015 1:26 PM

## 2015-06-20 NOTE — Progress Notes (Signed)
Patient ID: Robyn Porter, female   DOB: [birth year], 65 y.o.   MRN: [id number]      Subjective:    Continues to diurese well.  Denies SOB or CP.  Working with PT to increase mobility.  Will still need SNF on discharge. Abdomen no longer edematous. Still has swelling up into thighs/hips.   Out 26 L overall. Bed weights inaccurate overall, but todays weight matches closely with output. Likely down ~50 lbs so far.  Creatinine remains stable.  K 3.6  Objective:   Temp:  [98.1 F (36.7 C)-98.5 F (36.9 C)] 98.1 F (36.7 C) (02/22 0500) Pulse Rate:  [25-72] 48 (02/22 0506) Resp:  [16-21] 16 (02/22 0506) BP: (99-115)/(39-49) 106/43 mmHg (02/22 0506) SpO2:  [95 %-100 %] 95 % (02/22 0506) Weight:  [373 lb 6.4 oz (169.373 kg)] 373 lb 6.4 oz (169.373 kg) (02/22 0500) Last BM Date: 06/19/15  Filed Weights   06/18/15 0400 06/19/15 0506 06/20/15 0500  Weight: 380 lb (172.367 kg) 366 lb (166.017 kg) 373 lb 6.4 oz (169.373 kg)    Intake/Output Summary (Last 24 hours) at 06/20/15 0724 Last data filed at 06/20/15 0500  Gross per 24 hour  Intake   1090 ml  Output   4465 ml  Net  -3375 ml    Telemetry: Reviewed personally, V-paced 60s  Exam: CVP: 13-14 General:Morbidly Obese.  Pleasant, NAD. No resp difficulty Psych: Normal affect. HEENT: Normalocephalic/atraumatic, without mass or lesion. Neck: Supple, no bruits or JVD. Carotids 2+. No thyromegaly or nodule noted.  Heart: PMI nondisplaced. RRR. 1/6 SEM RUSB, distant heart sounds Lungs: CTAB, normal effort Abdomen: Morbidly obese, NT ND, no HSM. No bruits or masses. +BS  Extremities: No clubbing, cyanosis. DP/PT/Radials 2+ and equal bilaterally. 1-2+ edema into thighs.  Neuro: Alert and oriented X 3. Moves all extremities spontaneously.   Basic Metabolic Panel:  Recent Labs Lab 06/15/15 0530  06/17/15 0445 06/18/15 0455 06/19/15 0520 06/20/15 0510  NA 142  < > 142 140 141 142  K 4.6  < > 3.8 3.5 3.4* 3.6  CL 107   < > 100* 96* 94* 93*  CO2 25  < > 31 32 34* 35*  GLUCOSE 113*  < > 86 93 93 93  BUN 49*  < > 56* 54* 53* 52*  CREATININE 1.87*  < > 1.88* 1.81* 1.85* 1.79*  CALCIUM 9.6  < > 9.8 9.3 9.2 9.5  MG 1.9  --  1.7  --   --   --   < > = values in this interval not displayed.  Liver Function Tests: No results for input(s): AST, ALT, ALKPHOS, BILITOT, PROT, ALBUMIN in the last 168 hours.  CBC:  Recent Labs Lab 06/18/15 0455 06/19/15 0520 06/20/15 0510  WBC 6.6 6.6 5.5  HGB 8.5* 8.5* 8.4*  HCT 28.0* 27.9* 27.6*  MCV 94.9 94.9 94.8  PLT 136* 144* 137*    Cardiac Enzymes:  Recent Labs Lab 06/13/15 0916 06/13/15 1457  TROPONINI 0.03 0.03    BNP: No results for input(s): PROBNP in the last 8760 hours.  Coagulation: No results for input(s): INR in the last 168 hours.  ECG:   Medications:   Scheduled Medications: . allopurinol  100 mg Oral Daily  . antiseptic oral rinse  7 mL Mouth Rinse BID  . atorvastatin  20 mg Oral Daily  . budesonide-formoterol  2 puff Inhalation BID  . cholecalciferol  5,000 Units Oral Daily  . ferrous sulfate  650 mg Oral BID  WC  . insulin aspart  0-9 Units Subcutaneous TID WC  . ipratropium-albuterol  3 mL Nebulization TID  . loratadine  10 mg Oral Daily  . losartan  50 mg Oral Daily  . metolazone  5 mg Oral Daily  . metoprolol succinate  50 mg Oral Daily  . multivitamin with minerals  1 tablet Oral Daily  . potassium chloride  40 mEq Oral BID  . rivaroxaban  20 mg Oral Q supper  . saccharomyces boulardii  250 mg Oral BID  . sodium chloride flush  10-40 mL Intracatheter Q12H  . sodium chloride flush  3 mL Intravenous Q12H  . cyanocobalamin  500 mcg Oral Daily  . vitamin C  500 mg Oral Daily    Infusions: . furosemide (LASIX) infusion 10 mg/hr (06/19/15 0717)    PRN Medications: acetaminophen **OR** acetaminophen, albuterol, benzonatate, ondansetron **OR** ondansetron (ZOFRAN) IV, sodium chloride flush     Assessment/Plan   Dawnmarie Urban is a 65 y.o. female with history of diastolic CHF LVEF 99991111 in [place] 02/2015, COPD, and Asthma who presented to [facility] 06/12/15 with worsening SOB for several day  1. Acute on chronic systolic CHF  - Echo 99991111 shows EF 40-45%, down from 50-55% reportedly in 02/2015.  With no ACS symptoms, would hold off on ischemic work up with her CKD.  - Volume status improving but remains markedly volume overloaded. Continue lasix drip 10 per hour. Will repeat 5 mg metolazone with decent diuresis yesterday and stable Creatinine.  CVP trending down. May be able to transition to po meds Friday. Was previously only on 20 mg lasix daily (and then increased to 40 mg daily shortly before admission).  With her body habitus, suspect she would benefit from switch to torsemide for home.  - Supp K.  - Renal function stable.  - UNNA boots placed 06/19/15. 2. S/p Medtronic PPM - Interrogated by [employer], single chamber pacer, pacing 85% of the time. Battery and leads WNLs. - No complications.  - Chronic RV pacing may be contributing to CM, but with single chamber pacing, no way to decrease at this time.  3. Chronic Afib - Switched to Toprol XL from lopressor. - Continue Xarelto - This patients CHA2DS2-VASc Score and unadjusted Ischemic Stroke Rate (4.8 % per year)  from a score of 4 4. COPD - Stable. Per primary - Not a former smoker, though long exposure to second hand. - ?OHS Not on chronic 02.  5. OSA/ ? OHS component - Continue CPAP nightly  6. CKD stage III - Stable despite aggressive diuresis.  - Continue to follow closely with daily BMETs. 7. Deconditioning - Working with PT.  - Needs SNF at discharge  Shirley Friar PA-C 7:24 AM   Advanced Heart Failure Team Pager [phone] (M-F; 7a - 4p)  Please contact [facility] for night-coverage after hours (4p -7a ) and weekends on [email or web address]  Patient seen with PA, agree with the above note.  She continues to diurese well.   Creatinine stable.  CVP and JVP still up.  Will continue current diuretic regimen again today, reassess tomorrow.   [name] 06/20/2015

## 2015-06-20 NOTE — Plan of Care (Signed)
Problem: Tissue Perfusion: Goal: Risk factors for ineffective tissue perfusion will decrease Outcome: Completed/Met Date Met:  06/20/15 Pt is currently on Xarelto

## 2015-06-21 DIAGNOSIS — N179 Acute kidney failure, unspecified: Secondary | ICD-10-CM | POA: Insufficient documentation

## 2015-06-21 LAB — BASIC METABOLIC PANEL
ANION GAP: 14 (ref 5–15)
BUN: 54 mg/dL — ABNORMAL HIGH (ref 6–20)
CHLORIDE: 92 mmol/L — AB (ref 101–111)
CO2: 35 mmol/L — AB (ref 22–32)
CREATININE: 1.92 mg/dL — AB (ref 0.44–1.00)
Calcium: 9.5 mg/dL (ref 8.9–10.3)
GFR calc non Af Amer: 26 mL/min — ABNORMAL LOW (ref >60)
GFR, EST AFRICAN AMERICAN: 30 mL/min — AB (ref >60)
GLUCOSE: 93 mg/dL (ref 65–99)
Potassium: 4.1 mmol/L (ref 3.5–5.1)
Sodium: 141 mmol/L (ref 135–145)

## 2015-06-21 LAB — MAGNESIUM: Magnesium: 1.9 mg/dL (ref 1.7–2.4)

## 2015-06-21 LAB — GLUCOSE, CAPILLARY
GLUCOSE-CAPILLARY: 95 mg/dL (ref 65–99)
GLUCOSE-CAPILLARY: 141 mg/dL — AB (ref 65–99)
GLUCOSE-CAPILLARY: 117 mg/dL — AB (ref 65–99)
Glucose-Capillary: 114 mg/dL — ABNORMAL HIGH (ref 65–99)

## 2015-06-21 MED ORDER — POTASSIUM CHLORIDE CRYS ER 20 MEQ PO TBCR
40.0000 meq | EXTENDED_RELEASE_TABLET | Freq: Once | ORAL | Status: AC
  Administered 2015-06-21: 40 meq via ORAL
  Filled 2015-06-21: qty 2

## 2015-06-21 MED ORDER — IPRATROPIUM-ALBUTEROL 0.5-2.5 (3) MG/3ML IN SOLN
3.0000 mL | Freq: Two times a day (BID) | RESPIRATORY_TRACT | Status: DC
  Administered 2015-06-21 – 2015-06-22 (×2): 3 mL via RESPIRATORY_TRACT
  Filled 2015-06-21 (×2): qty 3

## 2015-06-21 MED ORDER — POTASSIUM CHLORIDE CRYS ER 20 MEQ PO TBCR
20.0000 meq | EXTENDED_RELEASE_TABLET | Freq: Once | ORAL | Status: AC
  Administered 2015-06-21: 20 meq via ORAL
  Filled 2015-06-21: qty 1

## 2015-06-21 NOTE — Progress Notes (Signed)
Patient ID: Robyn Porter, female   DOB: [birth year], 65 y.o.   MRN: [id number]      Subjective:    Overall feeling good. BP was soft so did PT at bedside.  No SOB, CP, lightheadedness, or dizziness.    Out ~26 L overall. Down another 2 lbs overnight, Likely down ~50 lbs so far.  Creatinine up slightly.   Objective:   Temp:  [97.4 F (36.3 C)-98.5 F (36.9 C)] 98.2 F (36.8 C) (02/23 0625) Pulse Rate:  [61-131] 81 (02/23 0818) Resp:  [14-21] 17 (02/23 0756) BP: (81-103)/(30-59) 101/59 mmHg (02/23 0625) SpO2:  [92 %-100 %] 100 % (02/23 0756) Weight:  [371 lb 8 oz (168.511 kg)] 371 lb 8 oz (168.511 kg) (02/23 0625) Last BM Date: 06/20/15  Filed Weights   06/19/15 0506 06/20/15 0500 06/21/15 0625  Weight: 366 lb (166.017 kg) 373 lb 6.4 oz (169.373 kg) 371 lb 8 oz (168.511 kg)    Intake/Output Summary (Last 24 hours) at 06/21/15 0917 Last data filed at 06/21/15 [id number]  Gross per 24 hour  Intake    850 ml  Output   2752 ml  Net  -1902 ml    Telemetry: Reviewed personally, V-paced 60-70s  Exam: CVP: 10-11 General:Morbidly Obese.  Pleasant, NAD. No resp difficulty Psych: Normal affect. HEENT: Normalocephalic/atraumatic, without mass or lesion. Neck: Thick, JVP 10 cm. Carotids 2+. No thyromegaly or nodule noted.  Heart: PMI nondisplaced. RRR. 2/6 SEM RUSB with clear S2, distant heart sounds Lungs: Clear, no resp distress Abdomen: Morbidly obese, NT ND, no HSM. No bruits or masses. +BS  Extremities: No clubbing, cyanosis. DP/PT/Radials 2+ and equal bilaterally. 1+ edema into thighs.  Neuro: Alert and oriented X 3. Moves all extremities spontaneously.   Basic Metabolic Panel:  Recent Labs Lab 06/17/15 0445  06/19/15 0520 06/20/15 0500 06/20/15 0510 06/21/15 0450  NA 142  < > 141  --  142 141  K 3.8  < > 3.4*  --  3.6 4.1  CL 100*  < > 94*  --  93* 92*  CO2 31  < > 34*  --  35* 35*  GLUCOSE 86  < > 93  --  93 93  BUN 56*  < > 53*  --  52* 54*  CREATININE  1.88*  < > 1.85*  --  1.79* 1.92*  CALCIUM 9.8  < > 9.2  --  9.5 9.5  MG 1.7  --   --  1.6*  --  1.9  < > = values in this interval not displayed.  Liver Function Tests: No results for input(s): AST, ALT, ALKPHOS, BILITOT, PROT, ALBUMIN in the last 168 hours.  CBC:  Recent Labs Lab 06/18/15 0455 06/19/15 0520 06/20/15 0510  WBC 6.6 6.6 5.5  HGB 8.5* 8.5* 8.4*  HCT 28.0* 27.9* 27.6*  MCV 94.9 94.9 94.8  PLT 136* 144* 137*    Cardiac Enzymes: No results for input(s): CKTOTAL, CKMB, CKMBINDEX, TROPONINI in the last 168 hours.  BNP: No results for input(s): PROBNP in the last 8760 hours.  Coagulation: No results for input(s): INR in the last 168 hours.  ECG:   Medications:   Scheduled Medications: . allopurinol  100 mg Oral Daily  . antiseptic oral rinse  7 mL Mouth Rinse BID  . atorvastatin  20 mg Oral Daily  . budesonide-formoterol  2 puff Inhalation BID  . cholecalciferol  5,000 Units Oral Daily  . ferrous sulfate  650 mg Oral BID WC  .  insulin aspart  0-9 Units Subcutaneous TID WC  . ipratropium-albuterol  3 mL Nebulization TID  . loratadine  10 mg Oral Daily  . losartan  25 mg Oral Daily  . metolazone  5 mg Oral Daily  . metoprolol succinate  50 mg Oral Daily  . multivitamin with minerals  1 tablet Oral Daily  . rivaroxaban  20 mg Oral Q supper  . saccharomyces boulardii  250 mg Oral BID  . sodium chloride flush  10-40 mL Intracatheter Q12H  . sodium chloride flush  3 mL Intravenous Q12H  . cyanocobalamin  500 mcg Oral Daily  . vitamin C  500 mg Oral Daily    Infusions: . furosemide (LASIX) infusion 10 mg/hr (06/20/15 1338)    PRN Medications: acetaminophen **OR** acetaminophen, albuterol, benzonatate, ondansetron **OR** ondansetron (ZOFRAN) IV, sodium chloride flush     Assessment/Plan   Robyn Porter is a 65 y.o. female with history of diastolic CHF LVEF 99991111 in [place] 02/2015, COPD, and Asthma who presented to [facility] 06/12/15 with worsening  SOB for several day  1. Acute on chronic systolic CHF  - Echo 99991111 shows EF 40-45%, down from 50-55% reportedly in 02/2015.  With no ACS symptoms, would hold off on ischemic work up with her CKD.  - Volume status improving but still with some volume overloaded.  - Continue lasix drip 10 per hour with 5 mg metolazone today, with plan to transition to po tomorrow as below.  - Will try and transition to po diuretics in am. Was previously only on 20 mg lasix daily (and then increased to 40 mg daily shortly before admission).  With her body habitus, suspect she would benefit from switch to torsemide for home.  - K stable. Cr bump slightly 1.79 -> 1.92. - UNNA boots placed 06/19/15. 2. S/p Medtronic PPM - Interrogated by Medtr[employer], single chamber pacer, pacing 85% of the time. Battery and leads WNLs. - No complications.  - Chronic RV pacing may be contributing to cardiomyopathy, will need EP followup, consider LV lead though may be difficult with body habitus.  3. Chronic Afib - Continue Toprol XL and Xarelto - This patients CHA2DS2-VASc Score and unadjusted Ischemic Stroke Rate (4.8 % per year)  from a score of 4 4. COPD - Stable. Per primary - Denies smoking history.  - ?OHS Not on chronic 02.  5. OSA/ ? OHS component - Continue CPAP nightly  6. CKD stage III - Starting to rise. May limit diuresis. Will attempt transition to po in am.  - Continue to follow closely with daily BMETs. 7. Deconditioning - Working with PT.  - Needs SNF at discharge  Shirley Friar PA-C 9:17 AM   Advanced Heart Failure Team Pager [phone] (M-F; 7a - 4p)  Please contact [facility] for night-coverage after hours (4p -7a ) and weekends on [email or web address]  Patient seen with PA, agree with the above note.  She remains volume overloaded but mild rise in BUN/creatinine yesterday.  Will continue Lasix gtt + metolazone today.  May have to transition to po tomorrow if creatinine continues to rise.   Will need SNF.   [name] 06/21/2015 10:59 AM

## 2015-06-21 NOTE — Clinical Social Work Note (Signed)
CSW met with patient at bedside. Patient states that she would prefer a SNF in [facility] at this time. CSW provided patient with list of facilities that have offered a bed in [facility] CSW to followup in the AM.   Liz Beach MSW, [place], [place], [phone]

## 2015-06-21 NOTE — Progress Notes (Signed)
Physical Therapy Treatment Patient Details Name: Robyn Porter MRN: [id number] DOB: 1950/08/11 Today's Date: 06/21/2015    History of Present Illness Robyn Porter is a 65 y.o. female with history of diastolic CHF last EF measured in November 2016 was 50-55% presents to the ER because of increasing shortness of breath and increased weight. Patient states that over the last 1 month patient has been noticing increasing shortness of breath both on sitting and exertion. Denies any chest pain    PT Comments    Pt eager for mobility and able to get OOB with short distance ambulation ~5', but became dizzy and needed to sit.  Pt's systolic BP had been in [id number] throughout bed mobility and transfer, however after ambulation and standing marching systolic BP decreased to 98.  Continue to feel pt will need SNF level of therapies at D/C.    Follow Up Recommendations  SNF;Supervision - Intermittent     Equipment Recommendations  None recommended by PT    Recommendations for Other Services       Precautions / Restrictions Precautions Precautions: Fall;ICD/Pacemaker Precaution Comments: Check BP Restrictions Weight Bearing Restrictions: No    Mobility  Bed Mobility Overal bed mobility: Needs Assistance Bed Mobility: Supine to Sit     Supine to sit: Min guard;HOB elevated     General bed mobility comments: When [facility] is elevated, pt is able to come to sitting with only A for management of lines and linens.  pt effortful and with heavy reliance on UEs.    Transfers Overall transfer level: Needs assistance Equipment used: Rolling walker (2 wheeled) Transfers: Sit to/from [facility] Sit to Stand: Min guard Stand pivot transfers: Min guard       General transfer comment: pt rocks and uses momentum with strong anterior lean.  pt holds her breath at times and encouraged to exhale with exertion of coming to standing.  pt demonstrates good use of RW with SPT to  commode.  Ambulation/Gait Ambulation/Gait assistance: Min guard Ambulation Distance (Feet): 5 Feet Assistive device: Rolling walker (2 wheeled) Gait Pattern/deviations: Step-through pattern;Decreased stride length;Trunk flexed;Wide base of support     General Gait Details: pt able to ambulate 5' from commode to recliner and then perform standing marching.  During marching pt indicated feeling dizzy.  pt returned to sitting and BP taken.  pt decreased from Q000111Q systolic to 98 systolic.     Stairs            Wheelchair Mobility    Modified Rankin (Stroke Patients Only)       Balance Overall balance assessment: Needs assistance Sitting-balance support: No upper extremity supported;Feet supported Sitting balance-Leahy Scale: Good     Standing balance support: Bilateral upper extremity supported;During functional activity Standing balance-Leahy Scale: Poor                      Cognition Arousal/Alertness: Awake/alert Behavior During Therapy: WFL for tasks assessed/performed Overall Cognitive Status: Within Functional Limits for tasks assessed                      Exercises      General Comments        Pertinent Vitals/Pain Pain Assessment: No/denies pain    Home Living                      Prior Function            PT Goals (current goals  can now be found in the care plan section) Acute Rehab PT Goals Patient Stated Goal: get better to go home soon PT Goal Formulation: With patient Time For Goal Achievement: 06/27/15 Potential to Achieve Goals: Fair Progress towards PT goals: Progressing toward goals    Frequency  Min 2X/week    PT Plan Current plan remains appropriate    Co-evaluation             End of Session Equipment Utilized During Treatment: Gait belt;Oxygen Activity Tolerance: Patient limited by fatigue;Treatment limited secondary to medical complications (Comment) (Limited by dizziness) Patient left: in  chair;with call bell/phone within reach     Time: [id number] PT Time Calculation (min) (ACUTE ONLY): 35 min  Charges:  $Gait Training: 8-22 mins $Therapeutic Activity: 8-22 mins                    G CodesCatarina Hartshorn, Chauncey 06/21/2015, 10:47 AM

## 2015-06-21 NOTE — Progress Notes (Signed)
[facility]                                                                              Patient Demographics  Robyn Porter, is a 65 y.o. female, DOB - Aug 11, 1950, [id number]  Admit date - 06/12/2015   Admitting Physician [name], MD  Outpatient Primary MD for the patient is ADDIS,DANIEL, DO  LOS - 8   Chief Complaint  Patient presents with  . Shortness of Breath      HPI on 06/13/15 on Garrett is a 65 y.o. female with history of diastolic CHF last EF measured in November 2016 was 50-55% presents to the ER because of increasing shortness of breath and increased weight. Patient states that over the last 1 month patient has been noticing increasing shortness of breath both on sitting and exertion. Denies any chest pain. Patient's Lasix was recently increased to 20 mg twice a day. Patient states she has been to the ER at least twice over the last 1 month. Patient was admitted at [facility] in November for Streptococcus bacteremia secondary to cellulitis at that time patient had 2-D echo and transesophageal echocardiogram which showed EF of 50-55%. At this time chest x-ray shows congestion and patient has been placed on Lasix and admitted for CHF. Patient denies any nausea vomiting abdominal pain or diarrhea.   Assessment & Plan   Acute on chronic diastolic CHF  -Echocardiogram shows EF of 40-45% -Heart failure team consulted and appreciated -Continue Lasix drip, metolazone -Continue to monitor intake and output, daily weight -Urine output over the last 24 hours 2750 cc -Continue Unna boots  Acute bronchitits/COPD  -Improving -Continue Symbicort, nebs  Proteus mirabilis UTI -Continue ceftriaxone  Chronic atrial fibrillation -CHADSVASC 4 -Continue metoprolol and Xarelto  Diabetes mellitus, type II -Continue insulin sliding scale CBG monitoring  Chronic anemia -Anemia consistent with anemia of chronic  disease -Hemoglobin 8.4 -Continue to monitor CBC  Obstructive sleep apnea -Continue CPAP  Acute on ?Chronic kidney disease, stage III -Unknown creatinine baseline -Creatinine 1.92 today (slight rise) -Continue to monitor BMP closely as patient is being diuresed.  History pacemaker -Interrogated by [employer], pacing 85% of the time  Deconditioning -PT consulted recommended SNF  Code Status: Full  Family Communication: None at bedside  Disposition Plan: Admitted.  Continue diuresis  Time Spent in minutes   30 minutes  Procedures  Echocardiogram  Consults   Cardiology  DVT Prophylaxis  Xarelto  Lab Results  Component Value Date   PLT 137* 06/20/2015    Medications  Scheduled Meds: . allopurinol  100 mg Oral Daily  . antiseptic oral rinse  7 mL Mouth Rinse BID  . atorvastatin  20 mg Oral Daily  . budesonide-formoterol  2 puff Inhalation BID  . cholecalciferol  5,000 Units Oral Daily  . ferrous sulfate  650 mg Oral BID WC  . insulin aspart  0-9 Units Subcutaneous TID WC  . ipratropium-albuterol  3 mL Nebulization BID  . loratadine  10 mg Oral Daily  . losartan  25 mg Oral Daily  . metolazone  5 mg Oral Daily  . metoprolol succinate  50 mg Oral Daily  .  multivitamin with minerals  1 tablet Oral Daily  . potassium chloride  20 mEq Oral Once  . rivaroxaban  20 mg Oral Q supper  . saccharomyces boulardii  250 mg Oral BID  . sodium chloride flush  10-40 mL Intracatheter Q12H  . sodium chloride flush  3 mL Intravenous Q12H  . cyanocobalamin  500 mcg Oral Daily  . vitamin C  500 mg Oral Daily   Continuous Infusions: . furosemide (LASIX) infusion 10 mg/hr (06/20/15 1338)   PRN Meds:.acetaminophen **OR** acetaminophen, albuterol, benzonatate, ondansetron **OR** ondansetron (ZOFRAN) IV, sodium chloride flush  Antibiotics    Anti-infectives    Start     Dose/Rate Route Frequency Ordered Stop   06/15/15 1230  cefTRIAXone (ROCEPHIN) 1 g in dextrose 5 % 50 mL IVPB      1 g 100 mL/hr over 30 Minutes Intravenous Every 24 hours 06/15/15 1149 06/17/15 1317   06/13/15 0915  cefTRIAXone (ROCEPHIN) 1 g in dextrose 5 % 50 mL IVPB  Status:  Discontinued     1 g 100 mL/hr over 30 Minutes Intravenous Every 24 hours 06/13/15 0912 06/13/15 0915      Subjective:   Robyn Porter seen and examined today.  Denies chest pain, abdominal pain, diarrhea or constipation, dizziness, headache. Feels breathing has improved.  Objective:   Filed Vitals:   06/21/15 0756 06/21/15 0818 06/21/15 0900 06/21/15 1200  BP:   123/73 103/33  Pulse: 131 81  60  Temp:    97.8 F (36.6 C)  TempSrc:    Oral  Resp: 17     Height:      Weight:      SpO2: 100%   100%    Wt Readings from Last 3 Encounters:  06/21/15 168.511 kg (371 lb 8 oz)     Intake/Output Summary (Last 24 hours) at 06/21/15 1344 Last data filed at 06/21/15 0620  Gross per 24 hour  Intake    360 ml  Output   2251 ml  Net  -1891 ml    Exam  General: Well developed, well nourished, NAD  HEENT: NCAT, mucous membranes moist.   Cardiovascular: S1 S2 auscultated, 1/6 SEM. RRR  Respiratory: Clear to auscultation bilaterally  Abdomen: Soft, obese, nontender, nondistended, + bowel sounds  Extremities: warm dry without cyanosis clubbing. LE in unna boots, 1+ edema extending to thighs  Neuro: AAOx3, nonfocal  Psych: Normal affect and demeanor, pleasant  Data Review   Micro Results Recent Results (from the past 240 hour(s))  Urine culture     Status: None   Collection Time: 06/13/15  1:34 AM  Result Value Ref Range Status   Specimen Description URINE, CATHETERIZED  Final   Special Requests NONE  Final   Culture >=100,000 COLONIES/mL PROTEUS MIRABILIS  Final   Report Status 06/15/2015 FINAL  Final   Organism ID, Bacteria PROTEUS MIRABILIS  Final      Susceptibility   Proteus mirabilis - MIC*    AMPICILLIN >=32 RESISTANT Resistant     CEFAZOLIN <=4 SENSITIVE Sensitive     CEFTRIAXONE <=1  SENSITIVE Sensitive     CIPROFLOXACIN >=4 RESISTANT Resistant     GENTAMICIN <=1 SENSITIVE Sensitive     IMIPENEM 2 SENSITIVE Sensitive     NITROFURANTOIN 128 RESISTANT Resistant     TRIMETH/SULFA >=320 RESISTANT Resistant     AMPICILLIN/SULBACTAM 4 SENSITIVE Sensitive     PIP/TAZO <=4 SENSITIVE Sensitive     * >=100,000 COLONIES/mL PROTEUS MIRABILIS    Radiology Reports  Dg Chest 2 View  06/12/2015  CLINICAL DATA:  65 year old female with shortness of breath and cough for the past month EXAM: CHEST  2 VIEW COMPARISON:  None. FINDINGS: Left subclavian approach single lead cardiac rhythm maintenance device with the lead projected over the right ventricle. Cardiomegaly. Pulmonary vascular congestion with mild interstitial edema. Small bilateral pleural effusions with associated bibasilar atelectasis. No acute osseous abnormality. IMPRESSION: Mild-moderate CHF. Electronically Signed   By: Jacqulynn Cadet M.D.   On: 06/12/2015 20:23   Dg Chest Port 1 View  06/15/2015  CLINICAL DATA:  Evaluate line placement. EXAM: PORTABLE CHEST 1 VIEW COMPARISON:  06/12/2015 FINDINGS: Again noted is a single lead left cardiac pacemaker. Placement of a right arm PICC line. The PICC catheter extends into the SVC but the tip is not well visualized. Heart is enlarged. Again noted are prominent central vascular structures. Trachea is midline. Negative for a pneumothorax. IMPRESSION: PICC line extends into the SVC region but the tip is not well visualized. Consider further evaluation with oblique images or lateral view. Again noted is cardiomegaly with enlarged central vascular structures. Findings are suggestive for vascular congestion or mild edema. Electronically Signed   By: Markus Daft M.D.   On: 06/15/2015 17:08    CBC  Recent Labs Lab 06/16/15 0510 06/17/15 0445 06/18/15 0455 06/19/15 0520 06/20/15 0510  WBC 5.8 7.1 6.6 6.6 5.5  HGB 9.2* 8.8* 8.5* 8.5* 8.4*  HCT 30.0* 29.3* 28.0* 27.9* 27.6*  PLT 133*  143* 136* 144* 137*  MCV 94.0 95.1 94.9 94.9 94.8  MCH 28.8 28.6 28.8 28.9 28.9  MCHC 30.7 30.0 30.4 30.5 30.4  RDW 17.7* 18.1* 18.0* 17.9* 17.7*    Chemistries   Recent Labs Lab 06/15/15 0530  06/17/15 0445 06/18/15 0455 06/19/15 0520 06/20/15 0500 06/20/15 0510 06/21/15 0450  NA 142  < > 142 140 141  --  142 141  K 4.6  < > 3.8 3.5 3.4*  --  3.6 4.1  CL 107  < > 100* 96* 94*  --  93* 92*  CO2 25  < > 31 32 34*  --  35* 35*  GLUCOSE 113*  < > 86 93 93  --  93 93  BUN 49*  < > 56* 54* 53*  --  52* 54*  CREATININE 1.87*  < > 1.88* 1.81* 1.85*  --  1.79* 1.92*  CALCIUM 9.6  < > 9.8 9.3 9.2  --  9.5 9.5  MG 1.9  --  1.7  --   --  1.6*  --  1.9  < > = values in this interval not displayed. ------------------------------------------------------------------------------------------------------------------ estimated creatinine clearance is 45.6 mL/min (by C-G formula based on Cr of 1.92). ------------------------------------------------------------------------------------------------------------------ No results for input(s): HGBA1C in the last 72 hours. ------------------------------------------------------------------------------------------------------------------ No results for input(s): CHOL, HDL, LDLCALC, TRIG, CHOLHDL, LDLDIRECT in the last 72 hours. ------------------------------------------------------------------------------------------------------------------ No results for input(s): TSH, T4TOTAL, T3FREE, THYROIDAB in the last 72 hours.  Invalid input(s): FREET3 ------------------------------------------------------------------------------------------------------------------ No results for input(s): VITAMINB12, FOLATE, FERRITIN, TIBC, IRON, RETICCTPCT in the last 72 hours.  Coagulation profile No results for input(s): INR, PROTIME in the last 168 hours.  No results for input(s): DDIMER in the last 72 hours.  Cardiac Enzymes No results for input(s): CKMB, TROPONINI,  MYOGLOBIN in the last 168 hours.  Invalid input(s): CK ------------------------------------------------------------------------------------------------------------------ Invalid input(s): POCBNP    Robyn Porter D.O. on 06/21/2015 at 1:44 PM  Between 7am to 7pm - Pager - [phone]  After 7pm  go to [email or web address] - password [email or web address]  And look for the night coverage person covering for me after hours  [facility] Office  [phone]

## 2015-06-22 LAB — GLUCOSE, CAPILLARY
GLUCOSE-CAPILLARY: 107 mg/dL — AB (ref 65–99)
Glucose-Capillary: 95 mg/dL (ref 65–99)
Glucose-Capillary: 123 mg/dL — ABNORMAL HIGH (ref 65–99)
Glucose-Capillary: 98 mg/dL (ref 65–99)

## 2015-06-22 LAB — CBC
HEMATOCRIT: 27 % — AB (ref 36.0–46.0)
Hemoglobin: 8.4 g/dL — ABNORMAL LOW (ref 12.0–15.0)
MCH: 29.8 pg (ref 26.0–34.0)
MCHC: 31.1 g/dL (ref 30.0–36.0)
MCV: 95.7 fL (ref 78.0–100.0)
PLATELETS: 134 10*3/uL — AB (ref 150–400)
RBC: 2.82 MIL/uL — ABNORMAL LOW (ref 3.87–5.11)
RDW: 17.7 % — AB (ref 11.5–15.5)
WBC: 5.1 10*3/uL (ref 4.0–10.5)

## 2015-06-22 LAB — BASIC METABOLIC PANEL
Anion gap: 11 (ref 5–15)
BUN: 55 mg/dL — AB (ref 6–20)
CO2: 34 mmol/L — AB (ref 22–32)
CREATININE: 1.89 mg/dL — AB (ref 0.44–1.00)
Calcium: 9.2 mg/dL (ref 8.9–10.3)
Chloride: 92 mmol/L — ABNORMAL LOW (ref 101–111)
GFR calc Af Amer: 31 mL/min — ABNORMAL LOW (ref >60)
GFR calc non Af Amer: 27 mL/min — ABNORMAL LOW (ref >60)
GLUCOSE: 92 mg/dL (ref 65–99)
Potassium: 4.2 mmol/L (ref 3.5–5.1)
SODIUM: 137 mmol/L (ref 135–145)

## 2015-06-22 MED ORDER — LOSARTAN POTASSIUM 25 MG PO TABS
12.5000 mg | ORAL_TABLET | Freq: Every day | ORAL | Status: DC
  Administered 2015-06-22 – 2015-06-25 (×4): 12.5 mg via ORAL
  Filled 2015-06-22 (×3): qty 1

## 2015-06-22 MED ORDER — METOPROLOL SUCCINATE ER 25 MG PO TB24
25.0000 mg | ORAL_TABLET | Freq: Every day | ORAL | Status: DC
  Administered 2015-06-22 – 2015-06-30 (×8): 25 mg via ORAL
  Filled 2015-06-22 (×8): qty 1

## 2015-06-22 NOTE — Progress Notes (Signed)
Patient to self-administer CPAP using her home equipment and with assistance from RN.  Patient and RN advised to call respiratory with questions and concerns.  Patient is familiar with equipment and procedure.

## 2015-06-22 NOTE — Progress Notes (Addendum)
[facility]                                                                              Patient Demographics  Robyn Porter, is a 65 y.o. female, DOB - 02/03/1951, [id number]  Admit date - 06/12/2015   Admitting Physician Robyn Patience, MD  Outpatient Primary MD for the patient is ADDIS,DANIEL, DO  LOS - 9   Chief Complaint  Patient presents with  . Shortness of Breath      HPI on 06/13/15 on [place] is a 65 y.o. female with history of diastolic CHF last EF measured in November 2016 was 50-55% presents to the ER because of increasing shortness of breath and increased weight. Patient states that over the last 1 month patient has been noticing increasing shortness of breath both on sitting and exertion. Denies any chest pain. Patient's Lasix was recently increased to 20 mg twice a day. Patient states she has been to the ER at least twice over the last 1 month. Patient was admitted at [facility] in November for Streptococcus bacteremia secondary to cellulitis at that time patient had 2-D echo and transesophageal echocardiogram which showed EF of 50-55%. At this time chest x-ray shows congestion and patient has been placed on Lasix and admitted for CHF. Patient denies any nausea vomiting abdominal pain or diarrhea.   Assessment & Plan   Acute on chronic systolic CHF  -Echocardiogram shows EF of 40-45% -Heart failure team consulted and appreciated -Continue Lasix drip, metolazone -Continue to monitor intake and output, daily weight (weight down 60lbs since admit) -Urine output over the last 24 hours 2175 cc -Continue Unna boots -Continue losartan and Toprol (doses decreased due to drops in BP with standing and dizziness)  Acute bronchitits/COPD  -Improving -Continue Symbicort, nebs  Proteus mirabilis UTI -Continue ceftriaxone  Chronic atrial fibrillation -CHADSVASC 4 -Continue metoprolol and Xarelto  Diabetes  mellitus, type II -Continue insulin sliding scale CBG monitoring  Chronic anemia -Anemia consistent with anemia of chronic disease -Hemoglobin 8.4 -Continue to monitor CBC  Obstructive sleep apnea -Continue CPAP  Acute on ?Chronic kidney disease, stage III -Unknown creatinine baseline -Creatinine 1.89 today  -Continue to monitor BMP closely as patient is being diuresed.  History pacemaker -Interrogated by Medtronic, pacing 85% of the time  Deconditioning -PT consulted recommended SNF  Code Status: Full  Family Communication: None at bedside  Disposition Plan: Admitted.  Continue diuresis.  Time Spent in minutes   30 minutes  Procedures  Echocardiogram  Consults   Cardiology  DVT Prophylaxis  Xarelto  Lab Results  Component Value Date   PLT 134* 06/22/2015    Medications  Scheduled Meds: . allopurinol  100 mg Oral Daily  . antiseptic oral rinse  7 mL Mouth Rinse BID  . atorvastatin  20 mg Oral Daily  . budesonide-formoterol  2 puff Inhalation BID  . cholecalciferol  5,000 Units Oral Daily  . ferrous sulfate  650 mg Oral BID WC  . insulin aspart  0-9 Units Subcutaneous TID WC  . loratadine  10 mg Oral Daily  . losartan  12.5 mg Oral Daily  . metolazone  5 mg Oral  Daily  . metoprolol succinate  25 mg Oral Daily  . multivitamin with minerals  1 tablet Oral Daily  . rivaroxaban  20 mg Oral Q supper  . saccharomyces boulardii  250 mg Oral BID  . sodium chloride flush  10-40 mL Intracatheter Q12H  . sodium chloride flush  3 mL Intravenous Q12H  . cyanocobalamin  500 mcg Oral Daily  . vitamin C  500 mg Oral Daily   Continuous Infusions: . furosemide (LASIX) infusion 12 mg/hr (06/22/15 1025)   PRN Meds:.acetaminophen **OR** acetaminophen, albuterol, benzonatate, ondansetron **OR** ondansetron (ZOFRAN) IV, sodium chloride flush  Antibiotics    Anti-infectives    Start     Dose/Rate Route Frequency Ordered Stop   06/15/15 1230  cefTRIAXone (ROCEPHIN) 1 g  in dextrose 5 % 50 mL IVPB     1 g 100 mL/hr over 30 Minutes Intravenous Every 24 hours 06/15/15 1149 06/17/15 1317   06/13/15 0915  cefTRIAXone (ROCEPHIN) 1 g in dextrose 5 % 50 mL IVPB  Status:  Discontinued     1 g 100 mL/hr over 30 Minutes Intravenous Every 24 hours 06/13/15 0912 06/13/15 0915      Subjective:   [name] seen and examined today.  Feels breathing has improved. Denies chest pain, abdominal pain, diarrhea or constipation, dizziness, headache.   Objective:   Filed Vitals:   06/22/15 0512 06/22/15 0753 06/22/15 0913 06/22/15 1026  BP:  98/46  74/47  Pulse:  88    Temp:  98.5 F (36.9 C)    TempSrc:  Oral    Resp:      Height:      Weight: 167.786 kg (369 lb 14.4 oz)     SpO2:  98% 98%     Wt Readings from Last 3 Encounters:  06/22/15 167.786 kg (369 lb 14.4 oz)     Intake/Output Summary (Last 24 hours) at 06/22/15 1133 Last data filed at 06/22/15 1125  Gross per 24 hour  Intake    250 ml  Output   2525 ml  Net  -2275 ml    Exam  General: Well developed, well nourished, obese, NAD  HEENT: NCAT, mucous membranes moist.   Cardiovascular: S1 S2 auscultated, 1/6 SEM. RRR  Respiratory: Clear to auscultation bilaterally  Abdomen: Soft, obese, nontender, nondistended, + bowel sounds  Extremities: warm dry without cyanosis clubbing. LE in unna boots, 1+ edema extending to thighs  Neuro: AAOx3, nonfocal  Psych: Appropriate mood and affect  Data Review   Micro Results Recent Results (from the past 240 hour(s))  Urine culture     Status: None   Collection Time: 06/13/15  1:34 AM  Result Value Ref Range Status   Specimen Description URINE, CATHETERIZED  Final   Special Requests NONE  Final   Culture >=100,000 COLONIES/mL PROTEUS MIRABILIS  Final   Report Status 06/15/2015 FINAL  Final   Organism ID, Bacteria PROTEUS MIRABILIS  Final      Susceptibility   Proteus mirabilis - MIC*    AMPICILLIN >=32 RESISTANT Resistant     CEFAZOLIN  <=4 SENSITIVE Sensitive     CEFTRIAXONE <=1 SENSITIVE Sensitive     CIPROFLOXACIN >=4 RESISTANT Resistant     GENTAMICIN <=1 SENSITIVE Sensitive     IMIPENEM 2 SENSITIVE Sensitive     NITROFURANTOIN 128 RESISTANT Resistant     TRIMETH/SULFA >=320 RESISTANT Resistant     AMPICILLIN/SULBACTAM 4 SENSITIVE Sensitive     PIP/TAZO <=4 SENSITIVE Sensitive     * >=  100,000 [place]    Radiology Reports Dg Chest 2 View  06/12/2015  CLINICAL DATA:  65 year old female with shortness of breath and cough for the past month EXAM: CHEST  2 VIEW COMPARISON:  None. FINDINGS: Left subclavian approach single lead cardiac rhythm maintenance device with the lead projected over the right ventricle. Cardiomegaly. Pulmonary vascular congestion with mild interstitial edema. Small bilateral pleural effusions with associated bibasilar atelectasis. No acute osseous abnormality. IMPRESSION: Mild-moderate CHF. Electronically Signed   By: Jacqulynn Cadet M.D.   On: 06/12/2015 20:23   Dg Chest Port 1 View  06/15/2015  CLINICAL DATA:  Evaluate line placement. EXAM: PORTABLE CHEST 1 VIEW COMPARISON:  06/12/2015 FINDINGS: Again noted is a single lead left cardiac pacemaker. Placement of a right arm PICC line. The PICC catheter extends into the SVC but the tip is not well visualized. Heart is enlarged. Again noted are prominent central vascular structures. Trachea is midline. Negative for a pneumothorax. IMPRESSION: PICC line extends into the SVC region but the tip is not well visualized. Consider further evaluation with oblique images or lateral view. Again noted is cardiomegaly with enlarged central vascular structures. Findings are suggestive for vascular congestion or mild edema. Electronically Signed   By: Markus Daft M.D.   On: 06/15/2015 17:08    CBC  Recent Labs Lab 06/17/15 0445 06/18/15 0455 06/19/15 0520 06/20/15 0510 06/22/15 0612  WBC 7.1 6.6 6.6 5.5 5.1  HGB 8.8* 8.5* 8.5* 8.4* 8.4*    HCT 29.3* 28.0* 27.9* 27.6* 27.0*  PLT 143* 136* 144* 137* 134*  MCV 95.1 94.9 94.9 94.8 95.7  MCH 28.6 28.8 28.9 28.9 29.8  MCHC 30.0 30.4 30.5 30.4 31.1  RDW 18.1* 18.0* 17.9* 17.7* 17.7*    Chemistries   Recent Labs Lab 06/17/15 0445 06/18/15 0455 06/19/15 0520 06/20/15 0500 06/20/15 0510 06/21/15 0450 06/22/15 0612  NA 142 140 141  --  142 141 137  K 3.8 3.5 3.4*  --  3.6 4.1 4.2  CL 100* 96* 94*  --  93* 92* 92*  CO2 31 32 34*  --  35* 35* 34*  GLUCOSE 86 93 93  --  93 93 92  BUN 56* 54* 53*  --  52* 54* 55*  CREATININE 1.88* 1.81* 1.85*  --  1.79* 1.92* 1.89*  CALCIUM 9.8 9.3 9.2  --  9.5 9.5 9.2  MG 1.7  --   --  1.6*  --  1.9  --    ------------------------------------------------------------------------------------------------------------------ estimated creatinine clearance is 46.2 mL/min (by C-G formula based on Cr of 1.89). ------------------------------------------------------------------------------------------------------------------ No results for input(s): HGBA1C in the last 72 hours. ------------------------------------------------------------------------------------------------------------------ No results for input(s): CHOL, HDL, LDLCALC, TRIG, CHOLHDL, LDLDIRECT in the last 72 hours. ------------------------------------------------------------------------------------------------------------------ No results for input(s): TSH, T4TOTAL, T3FREE, THYROIDAB in the last 72 hours.  Invalid input(s): FREET3 ------------------------------------------------------------------------------------------------------------------ No results for input(s): VITAMINB12, FOLATE, FERRITIN, TIBC, IRON, RETICCTPCT in the last 72 hours.  Coagulation profile No results for input(s): INR, PROTIME in the last 168 hours.  No results for input(s): DDIMER in the last 72 hours.  Cardiac Enzymes No results for input(s): CKMB, TROPONINI, MYOGLOBIN in the last 168 hours.  Invalid  input(s): CK ------------------------------------------------------------------------------------------------------------------ Invalid input(s): POCBNP    Blakleigh Straw D.O. on 06/22/2015 at 11:33 AM  Between 7am to 7pm - Pager - [phone]  After 7pm go to [email or web address] - password [email or web address]  And look for the night coverage person covering for me after hours  [facility] Office  [phone]  

## 2015-06-22 NOTE — Progress Notes (Signed)
UR Completed Jameah Rouser Graves-Bigelow, RN,BSN [phone]  

## 2015-06-22 NOTE — Progress Notes (Signed)
Patient ID: Robyn Porter, female   DOB: [birth year], 65 y.o.   MRN: [id number]      Subjective:    Overall feeling much improved.  Continues to work eagerly with PT. Working on getting SNF arranged. Would like to stay in area for awhile if possible ([place]). Denies SOB or CP.  Did get a little lightheadedness with prolonged standing yesterday.   Out ~31 L overall. Down another 2 lbs overnight, Nearly down 60 lbs from admit. Creatinine up with slight increase yesterday but relatively stable today.  Objective:   Temp:  [97.8 F (36.6 C)-98.9 F (37.2 C)] 98.5 F (36.9 C) (02/24 0753) Pulse Rate:  [53-88] 88 (02/24 0753) Resp:  [16-19] 19 (02/24 0501) BP: (98-123)/(33-73) 98/46 mmHg (02/24 0753) SpO2:  [94 %-100 %] 98 % (02/24 0753) Weight:  [369 lb 14.4 oz (167.786 kg)] 369 lb 14.4 oz (167.786 kg) (02/24 0512) Last BM Date: 06/20/15 (per pt)  Filed Weights   06/20/15 0500 06/21/15 0625 06/22/15 0512  Weight: 373 lb 6.4 oz (169.373 kg) 371 lb 8 oz (168.511 kg) 369 lb 14.4 oz (167.786 kg)    Intake/Output Summary (Last 24 hours) at 06/22/15 0830 Last data filed at 06/22/15 0506  Gross per 24 hour  Intake     10 ml  Output   2175 ml  Net  -2165 ml    Telemetry: Reviewed personally, V-paced 60-70s  Exam: CVP: 14-15 General:Morbidly Obese.  Pleasant, NAD. No resp difficulty Psych: Normal affect. HEENT: Normalocephalic/atraumatic, without mass or lesion. Neck: Thick, JVP to jaw cm. Carotids 2+. No thyromegaly or nodule noted.  Heart: PMI nondisplaced. RRR. 2/6 SEM RUSB with clear S2, distant heart sounds Lungs: Clear, no resp distress Abdomen: Morbidly obese, NT ND, no HSM. No bruits or masses. +BS  Extremities: No clubbing, cyanosis. DP/PT/Radials 2+ and equal bilaterally. 1-2+ edema into thighs.  Neuro: Alert and oriented X 3. Moves all extremities spontaneously.   Basic Metabolic Panel:  Recent Labs Lab 06/17/15 0445  06/20/15 0500 06/20/15 0510  06/21/15 0450 06/22/15 0612  NA 142  < >  --  142 141 137  K 3.8  < >  --  3.6 4.1 4.2  CL 100*  < >  --  93* 92* 92*  CO2 31  < >  --  35* 35* 34*  GLUCOSE 86  < >  --  93 93 92  BUN 56*  < >  --  52* 54* 55*  CREATININE 1.88*  < >  --  1.79* 1.92* 1.89*  CALCIUM 9.8  < >  --  9.5 9.5 9.2  MG 1.7  --  1.6*  --  1.9  --   < > = values in this interval not displayed.  Liver Function Tests: No results for input(s): AST, ALT, ALKPHOS, BILITOT, PROT, ALBUMIN in the last 168 hours.  CBC:  Recent Labs Lab 06/19/15 0520 06/20/15 0510 06/22/15 0612  WBC 6.6 5.5 5.1  HGB 8.5* 8.4* 8.4*  HCT 27.9* 27.6* 27.0*  MCV 94.9 94.8 95.7  PLT 144* 137* 134*    Cardiac Enzymes: No results for input(s): CKTOTAL, CKMB, CKMBINDEX, TROPONINI in the last 168 hours.  BNP: No results for input(s): PROBNP in the last 8760 hours.  Coagulation: No results for input(s): INR in the last 168 hours.  ECG:   Medications:   Scheduled Medications: . allopurinol  100 mg Oral Daily  . antiseptic oral rinse  7 mL Mouth Rinse BID  . atorvastatin  20 mg Oral Daily  . budesonide-formoterol  2 puff Inhalation BID  . cholecalciferol  5,000 Units Oral Daily  . ferrous sulfate  650 mg Oral BID WC  . insulin aspart  0-9 Units Subcutaneous TID WC  . ipratropium-albuterol  3 mL Nebulization BID  . loratadine  10 mg Oral Daily  . losartan  25 mg Oral Daily  . metolazone  5 mg Oral Daily  . metoprolol succinate  50 mg Oral Daily  . multivitamin with minerals  1 tablet Oral Daily  . rivaroxaban  20 mg Oral Q supper  . saccharomyces boulardii  250 mg Oral BID  . sodium chloride flush  10-40 mL Intracatheter Q12H  . sodium chloride flush  3 mL Intravenous Q12H  . cyanocobalamin  500 mcg Oral Daily  . vitamin C  500 mg Oral Daily    Infusions: . furosemide (LASIX) infusion 10 mg/hr (06/21/15 1927)    PRN Medications: acetaminophen **OR** acetaminophen, albuterol, benzonatate, ondansetron **OR**  ondansetron (ZOFRAN) IV, sodium chloride flush     Assessment/Plan   Robyn Porter is a 65 y.o. female with history of diastolic CHF LVEF 99991111 in [place] 02/2015, COPD, and Asthma who presented to [facility] 06/12/15 with worsening SOB for several day  1. Acute on chronic systolic CHF  - Echo 99991111 shows EF 40-45%, down from 50-55% reportedly in 02/2015.  With no ACS symptoms, would hold off on ischemic work up with her CKD.  - She remains markedly volume overloaded.  CVP still showing 14-15 with edema into thighs.  CVP of 10 yesterday.  - Continue lasix drip 12 per hour with 5 mg metolazone for now. Will continue as long as Cr remains stable.  - K stable. Cr up slightly but stable 1.79 -> 1.92 -> 1.89. - UNNA boots placed 06/19/15. 2. S/p Medtronic PPM - Interrogated by [employer], single chamber pacer, pacing 85% of the time. Battery and leads WNLs. - No complications.  - Chronic RV pacing may be contributing to cardiomyopathy, will need EP followup, consider LV lead though may be difficult with body habitus.  3. Chronic Afib - Continue Toprol XL and Xarelto - This patients CHA2DS2-VASc Score and unadjusted Ischemic Stroke Rate (4.8 % per year)  from a score of 4 4. COPD - Stable. Per primary - Denies smoking history.  - ?OHS Not on chronic 02.  5. OSA/ ? OHS component - Continue CPAP nightly  6. CKD stage III - Stable. Will continue IV diuresis as creatinine allows.  - Continue to follow closely with daily BMETs. 7. Deconditioning - Working with PT. To SNF at discharge.   Satira Mccallum Tillery PA-C 8:30 AM   Advanced Heart Failure Team Pager [phone] (M-F; 7a - 4p)  Please contact [facility] for night-coverage after hours (4p -7a ) and weekends on [email or web address]  Patient seen with PA, agree with the above note.  She diuresed well again yesterday and weight down.  BUN/creatinine stable and CVP still elevated, so will continue diuresis another day.  Will get metolazone 5  mg x 1 and Lasix gtt at 12 mg/hr.   BP drops with standing, mild dizziness.  Will decrease losartan to 12.5 daily and Toprol XL to 25 daily.   [name] 06/22/2015 10:24 AM

## 2015-06-22 NOTE — Clinical Social Work Note (Signed)
[facility] has received authorization for SNF and is prepared to accept the patient this weekend if she is stable for discharge. Patient and family updated. CSW left report for weekend CSW.   Liz Beach MSW, [place], [place], [id number]

## 2015-06-22 NOTE — Clinical Social Work Note (Signed)
PASRR number received [id number].  Liz Beach MSW, [place], [place], [id number]

## 2015-06-22 NOTE — Clinical Social Work Placement (Signed)
   CLINICAL SOCIAL WORK PLACEMENT  NOTE  Date:  06/22/2015  Patient Details  Name: Robyn Porter MRN: [id number] Date of Birth: 01-Mar-1951  Clinical Social Work is seeking post-discharge placement for this patient at the [place] level of care (*CSW will initial, date and re-position this form in  chart as items are completed):  Yes   Patient/family provided with [place] Work Department's list of facilities offering this level of care within the geographic area requested by the patient (or if unable, by the patient's family).  Yes   Patient/family informed of their freedom to choose among providers that offer the needed level of care, that participate in Medicare, Medicaid or managed care program needed by the patient, have an available bed and are willing to accept the patient.  Yes   Patient/family informed of [place]'s ownership interest in [facility] and [facility], as well as of the fact that they are under no obligation to receive care at these facilities.  PASRR submitted to EDS on 06/22/15     PASRR number received on 06/22/15     Existing PASRR number confirmed on       FL2 transmitted to all facilities in geographic area requested by pt/family on 06/22/15     FL2 transmitted to all facilities within larger geographic area on       Patient informed that his/her managed care company has contracts with or will negotiate with certain facilities, including the following:        Yes   Patient/family informed of bed offers received.  Patient chooses bed at [facility] and [place] recommends and patient chooses bed at      Patient to be transferred to [facility] on  .  Patient to be transferred to facility by       Patient family notified on   of transfer.  Name of family member notified:        PHYSICIAN       Additional Comment:     _______________________________________________ [name], LCSW 06/22/2015, 5:22 PM

## 2015-06-23 DIAGNOSIS — I429 Cardiomyopathy, unspecified: Secondary | ICD-10-CM

## 2015-06-23 DIAGNOSIS — Z8679 Personal history of other diseases of the circulatory system: Secondary | ICD-10-CM

## 2015-06-23 DIAGNOSIS — G4733 Obstructive sleep apnea (adult) (pediatric): Secondary | ICD-10-CM

## 2015-06-23 DIAGNOSIS — E119 Type 2 diabetes mellitus without complications: Secondary | ICD-10-CM

## 2015-06-23 DIAGNOSIS — N179 Acute kidney failure, unspecified: Secondary | ICD-10-CM

## 2015-06-23 DIAGNOSIS — E669 Obesity, unspecified: Secondary | ICD-10-CM

## 2015-06-23 DIAGNOSIS — I5043 Acute on chronic combined systolic (congestive) and diastolic (congestive) heart failure: Secondary | ICD-10-CM | POA: Insufficient documentation

## 2015-06-23 LAB — CBC
HCT: 26.3 % — ABNORMAL LOW (ref 36.0–46.0)
HEMOGLOBIN: 7.8 g/dL — AB (ref 12.0–15.0)
MCH: 28.2 pg (ref 26.0–34.0)
MCHC: 29.7 g/dL — ABNORMAL LOW (ref 30.0–36.0)
MCV: 94.9 fL (ref 78.0–100.0)
Platelets: 139 10*3/uL — ABNORMAL LOW (ref 150–400)
RBC: 2.77 MIL/uL — AB (ref 3.87–5.11)
RDW: 17.6 % — ABNORMAL HIGH (ref 11.5–15.5)
WBC: 5.4 10*3/uL (ref 4.0–10.5)

## 2015-06-23 LAB — BASIC METABOLIC PANEL
ANION GAP: 11 (ref 5–15)
BUN: 60 mg/dL — ABNORMAL HIGH (ref 6–20)
CALCIUM: 9.2 mg/dL (ref 8.9–10.3)
CO2: 33 mmol/L — AB (ref 22–32)
Chloride: 95 mmol/L — ABNORMAL LOW (ref 101–111)
Creatinine, Ser: 2.15 mg/dL — ABNORMAL HIGH (ref 0.44–1.00)
GFR calc non Af Amer: 23 mL/min — ABNORMAL LOW (ref >60)
GFR, EST AFRICAN AMERICAN: 26 mL/min — AB (ref >60)
Glucose, Bld: 93 mg/dL (ref 65–99)
Potassium: 3.8 mmol/L (ref 3.5–5.1)
Sodium: 139 mmol/L (ref 135–145)

## 2015-06-23 LAB — GLUCOSE, CAPILLARY
GLUCOSE-CAPILLARY: 89 mg/dL (ref 65–99)
GLUCOSE-CAPILLARY: 113 mg/dL — AB (ref 65–99)
Glucose-Capillary: 94 mg/dL (ref 65–99)
Glucose-Capillary: 106 mg/dL — ABNORMAL HIGH (ref 65–99)

## 2015-06-23 LAB — MAGNESIUM: MAGNESIUM: 1.7 mg/dL (ref 1.7–2.4)

## 2015-06-23 MED ORDER — TORSEMIDE 20 MG PO TABS
20.0000 mg | ORAL_TABLET | Freq: Every day | ORAL | Status: DC
  Administered 2015-06-24 – 2015-06-25 (×2): 20 mg via ORAL
  Filled 2015-06-23 (×2): qty 1

## 2015-06-23 NOTE — Progress Notes (Signed)
SUBJECTIVE: Pt feeling better. Still gets weak and lightheaded with activity.     Intake/Output Summary (Last 24 hours) at 06/23/15 1214 Last data filed at 06/23/15 0700  Gross per 24 hour  Intake    480 ml  Output   2250 ml  Net  -1770 ml    Current Facility-Administered Medications  Medication Dose Route Frequency Provider Last Rate Last Dose  . acetaminophen (TYLENOL) tablet 650 mg  650 mg Oral Q6H PRN [name], MD   650 mg at 06/22/15 1028   Or  . acetaminophen (TYLENOL) suppository 650 mg  650 mg Rectal Q6H PRN [name], MD      . albuterol (PROVENTIL) (2.5 MG/3ML) 0.083% nebulizer solution 2.5 mg  2.5 mg Nebulization Q6H PRN [name], MD      . allopurinol (ZYLOPRIM) tablet 100 mg  100 mg Oral Daily [name], MD   100 mg at 06/23/15 0912  . antiseptic oral rinse (CPC / CETYLPYRIDINIUM CHLORIDE 0.05%) solution 7 mL  7 mL Mouth Rinse BID Ripudeep Krystal Eaton, MD   7 mL at 06/23/15 0911  . atorvastatin (LIPITOR) tablet 20 mg  20 mg Oral Daily [name], MD   20 mg at 06/23/15 0908  . benzonatate (TESSALON) capsule 200 mg  200 mg Oral TID PRN [name], MD      . budesonide-formoterol (SYMBICORT) 80-4.5 MCG/ACT inhaler 2 puff  2 puff Inhalation BID [name], MD   2 puff at 06/23/15 1122  . cholecalciferol (VITAMIN D) tablet 5,000 Units  5,000 Units Oral Daily [name], MD   5,000 Units at 06/23/15 [phone]  . ferrous sulfate tablet 650 mg  650 mg Oral BID WC [name], MD   650 mg at 06/23/15 0910  . furosemide (LASIX) 250 mg in dextrose 5 % 250 mL (1 mg/mL) infusion  12 mg/hr Intravenous Continuous Larey Dresser, MD 12 mL/hr at 06/22/15 1700 12 mg/hr at 06/22/15 1700  . insulin aspart (novoLOG) injection 0-9 Units  0-9 Units Subcutaneous TID WC [name], MD   1 Units at 06/22/15 1223  . loratadine (CLARITIN) tablet 10 mg  10 mg Oral Daily [name], MD   10 mg at  06/23/15 [id number]  . losartan (COZAAR) tablet 12.5 mg  12.5 mg Oral Daily Larey Dresser, MD   12.5 mg at 06/23/15 0909  . metolazone (ZAROXOLYN) tablet 5 mg  5 mg Oral Daily Shirley Friar, PA-C   5 mg at 06/23/15 [id number]  . metoprolol succinate (TOPROL-XL) 24 hr tablet 25 mg  25 mg Oral Daily Larey Dresser, MD   25 mg at 06/22/15 1031  . multivitamin with minerals tablet 1 tablet  1 tablet Oral Daily [name], MD   1 tablet at 06/23/15 [phone]  . ondansetron (ZOFRAN) tablet 4 mg  4 mg Oral Q6H PRN [name], MD       Or  . ondansetron [facility]) injection 4 mg  4 mg Intravenous Q6H PRN [name], MD      . rivaroxaban Alveda Reasons) tablet 20 mg  20 mg Oral Q supper [name], MD   20 mg at 06/22/15 1713  . saccharomyces boulardii (FLORASTOR) capsule 250 mg  250 mg Oral BID Ripudeep Krystal Eaton, MD   250 mg at 06/23/15 0909  . sodium chloride flush (NS) 0.9 % injection 10-40 mL  10-40 mL Intracatheter Q12H Ripudeep Krystal Eaton, MD   10 mL at 06/22/15 2154  . sodium chloride flush (NS) 0.9 % injection 10-40 mL  10-40 mL Intracatheter PRN Ripudeep K Rai, MD      . sodium chloride flush (NS) 0.9 % injection 3 mL  3 mL Intravenous Q12H [name], MD   3 mL at 06/22/15 2154  . vitamin B-12 (CYANOCOBALAMIN) tablet 500 mcg  500 mcg Oral Daily [name], MD   500 mcg at 06/23/15 0909  . vitamin C (ASCORBIC ACID) tablet 500 mg  500 mg Oral Daily [name], MD   500 mg at 06/23/15 0908    Filed Vitals:   06/23/15 0710 06/23/15 0715 06/23/15 1122 06/23/15 1200  BP:  106/48  96/31  Pulse: 59 38  62  Temp:  98.6 F (37 C)  98.4 F (36.9 C)  TempSrc:  Oral  Oral  Resp: 17 15    Height:      Weight:      SpO2: 100% 100% 95% 92%    PHYSICAL EXAM General:Morbidly Obese. Pleasant, NAD. No resp difficulty Psych: Normal affect. HEENT: Normalocephalic/atraumatic, without mass or lesion. Neck: Thick, JVP to jaw. No thyromegaly or nodule  noted.  Heart: PMI nondisplaced. RRR. 2/6 SEM RUSB with clear S2, distant heart sounds Lungs: Clear, no resp distress Abdomen: Morbidly obese. Extremities: No clubbing, cyanosis.1-2+ edema into thighs. Legs wrapped in bandages. Neuro: Alert and oriented X 3. Moves all extremities spontaneously.   LABS: Basic Metabolic Panel:  Recent Labs  06/21/15 0450 06/22/15 0612 06/23/15 0445  NA 141 137 139  K 4.1 4.2 3.8  CL 92* 92* 95*  CO2 35* 34* 33*  GLUCOSE 93 92 93  BUN 54* 55* 60*  CREATININE 1.92* 1.89* 2.15*  CALCIUM 9.5 9.2 9.2  MG 1.9  --   --    Liver Function Tests: No results for input(s): AST, ALT, ALKPHOS, BILITOT, PROT, ALBUMIN in the last 72 hours. No results for input(s): LIPASE, AMYLASE in the last 72 hours. CBC:  Recent Labs  06/22/15 0612 06/23/15 0445  WBC 5.1 5.4  HGB 8.4* 7.8*  HCT 27.0* 26.3*  MCV 95.7 94.9  PLT 134* 139*   Cardiac Enzymes: No results for input(s): CKTOTAL, CKMB, CKMBINDEX, TROPONINI in the last 72 hours. BNP: Invalid input(s): POCBNP D-Dimer: No results for input(s): DDIMER in the last 72 hours. Hemoglobin A1C: No results for input(s): HGBA1C in the last 72 hours. Fasting Lipid Panel: No results for input(s): CHOL, HDL, LDLCALC, TRIG, CHOLHDL, LDLDIRECT in the last 72 hours. Thyroid Function Tests: No results for input(s): TSH, T4TOTAL, T3FREE, THYROIDAB in the last 72 hours.  Invalid input(s): FREET3 Anemia Panel: No results for input(s): VITAMINB12, FOLATE, FERRITIN, TIBC, IRON, RETICCTPCT in the last 72 hours.  RADIOLOGY: Dg Chest 2 View  06/12/2015  CLINICAL DATA:  65 year old female with shortness of breath and cough for the past month EXAM: CHEST  2 VIEW COMPARISON:  None. FINDINGS: Left subclavian approach single lead cardiac rhythm maintenance device with the lead projected over the right ventricle. Cardiomegaly. Pulmonary vascular congestion with mild interstitial edema. Small bilateral pleural effusions with  associated bibasilar atelectasis. No acute osseous abnormality. IMPRESSION: Mild-moderate CHF. Electronically Signed   By: Jacqulynn Cadet M.D.   On: 06/12/2015 20:23   Dg Chest Port 1 View  06/15/2015  CLINICAL DATA:  Evaluate line placement. EXAM: PORTABLE CHEST 1 VIEW COMPARISON:  06/12/2015 FINDINGS: Again noted is a  single lead left cardiac pacemaker. Placement of a right arm PICC line. The PICC catheter extends into the SVC but the tip is not well visualized. Heart is enlarged. Again noted are prominent central vascular structures. Trachea is midline. Negative for a pneumothorax. IMPRESSION: PICC line extends into the SVC region but the tip is not well visualized. Consider further evaluation with oblique images or lateral view. Again noted is cardiomegaly with enlarged central vascular structures. Findings are suggestive for vascular congestion or mild edema. Electronically Signed   By: Markus Daft M.D.   On: 06/15/2015 17:08      ASSESSMENT AND PLAN: 1. Acute on chronic systolic CHF  - Echo [id number] shows EF 40-45%, down from 50-55% reportedly in 02/2015. With no ACS symptoms, would hold off on ischemic work up with her CKD.  - Will d/c Lasix drip and switch to torsemide 20 mg daily beginning 2/26 given rise in BUN/creatinine. - Continue metolazone 5 mg. - UNNA boots placed 06/19/15.  2. S/p Medtronic PPM - Interrogated by Medtronic, single chamber pacer, pacing 85% of the time. Battery and leads WNLs. - No complications.  - Chronic RV pacing may be contributing to cardiomyopathy, will need EP followup, consider LV lead though may be difficult with body habitus.   3. Chronic Afib - Continue Toprol XL 25 mg daily and Xarelto - This patients CHA2DS2-VASc Score and unadjusted Ischemic Stroke Rate (4.8 % per year) from a score of 4  4. COPD - Stable. Per primary - Denies smoking history.  - ?OHS Not on chronic 02.   5. OSA/ ? OHS component - Continue CPAP nightly   6.  CKD stage III - Worsening BUN/creatinine.  - Will d/c Lasix drip and switch to torsemide 20 mg daily beginning 2/26  7. Deconditioning - Working with PT. To SNF at discharge.    Kate Sable, M.D., F.A.C.C.

## 2015-06-23 NOTE — Progress Notes (Signed)
9 beats VTACH.  Patient asymptomatic.  Dr. Angelica Pou notified.  Verbal received for magnesium lab draw and to notify if mag is less than 1.8.

## 2015-06-23 NOTE — Progress Notes (Signed)
[facility]                                                                              Patient Demographics  Robyn Porter, is a 65 y.o. female, DOB - [birth year], TD:[id number]  Admit date - 06/12/2015   Admitting Physician [name], MD  Outpatient Primary MD for the patient is ADDIS,DANIEL, DO  LOS - 10   Chief Complaint  Patient presents with  . Shortness of Breath      HPI on 06/13/15 on [place] is a 65 y.o. female with history of diastolic CHF last EF measured in November 2016 was 50-55% presents to the ER because of increasing shortness of breath and increased weight. Patient states that over the last 1 month patient has been noticing increasing shortness of breath both on sitting and exertion. Denies any chest pain. Patient's Lasix was recently increased to 20 mg twice a day. Patient states she has been to the ER at least twice over the last 1 month. Patient was admitted at [facility] in November for Streptococcus bacteremia secondary to cellulitis at that time patient had 2-D echo and transesophageal echocardiogram which showed EF of 50-55%. At this time chest x-ray shows congestion and patient has been placed on Lasix and admitted for CHF. Patient denies any nausea vomiting abdominal pain or diarrhea.   Assessment & Plan   Acute on chronic systolic CHF  -Echocardiogram shows EF of 40-45% -Heart failure team consulted and appreciated -Currently on Lasix drip, metolazone -Continue to monitor intake and output, daily weight (weight down 60lbs since admit) -Urine output over the last 24 hours 2600 cc -Continue Unna boots -Continue losartan and Toprol (doses decreased due to drops in BP with standing and dizziness) -Pending cardiology input, may consider switching to PO diuretics today given increase in creatinine  Acute bronchitits/COPD  -Improving -Continue Symbicort, nebs  Proteus mirabilis  UTI -Continue ceftriaxone  Chronic atrial fibrillation -CHADSVASC 4 -Continue metoprolol and Xarelto  Diabetes mellitus, type II -Continue insulin sliding scale CBG monitoring  Chronic anemia -Anemia consistent with anemia of chronic disease -Hemoglobin 7.8 -Continue to monitor CBC -Will transfuse if Hb <7  Obstructive sleep apnea -Continue CPAP  Acute on ?Chronic kidney disease, stage III -Unknown creatinine baseline -Creatinine 2.15 today  -Continue to monitor BMP closely as patient is being diuresed.  History pacemaker -Interrogated by Medtronic, pacing 85% of the time  Deconditioning -PT consulted recommended SNF  Code Status: Full  Family Communication: None at bedside  Disposition Plan: Admitted.  Continue diuresis. Pending further recommendations from cardiology.  Time Spent in minutes   30 minutes  Procedures  Echocardiogram  Consults   Cardiology  DVT Prophylaxis  Xarelto  Lab Results  Component Value Date   PLT 139* 06/23/2015    Medications  Scheduled Meds: . allopurinol  100 mg Oral Daily  . antiseptic oral rinse  7 mL Mouth Rinse BID  . atorvastatin  20 mg Oral Daily  . budesonide-formoterol  2 puff Inhalation BID  . cholecalciferol  5,000 Units Oral Daily  . ferrous sulfate  650 mg Oral BID WC  . insulin aspart  0-9 Units Subcutaneous  TID WC  . loratadine  10 mg Oral Daily  . losartan  12.5 mg Oral Daily  . metolazone  5 mg Oral Daily  . metoprolol succinate  25 mg Oral Daily  . multivitamin with minerals  1 tablet Oral Daily  . rivaroxaban  20 mg Oral Q supper  . saccharomyces boulardii  250 mg Oral BID  . sodium chloride flush  10-40 mL Intracatheter Q12H  . sodium chloride flush  3 mL Intravenous Q12H  . [START ON 06/24/2015] torsemide  20 mg Oral Daily  . cyanocobalamin  500 mcg Oral Daily  . vitamin C  500 mg Oral Daily   Continuous Infusions:   PRN Meds:.acetaminophen **OR** acetaminophen, albuterol, benzonatate,  ondansetron **OR** ondansetron (ZOFRAN) IV, sodium chloride flush  Antibiotics    Anti-infectives    Start     Dose/Rate Route Frequency Ordered Stop   06/15/15 1230  cefTRIAXone (ROCEPHIN) 1 g in dextrose 5 % 50 mL IVPB     1 g 100 mL/hr over 30 Minutes Intravenous Every 24 hours 06/15/15 1149 06/17/15 1317   06/13/15 0915  cefTRIAXone (ROCEPHIN) 1 g in dextrose 5 % 50 mL IVPB  Status:  Discontinued     1 g 100 mL/hr over 30 Minutes Intravenous Every 24 hours 06/13/15 0912 06/13/15 0915      Subjective:   Robyn Porter seen and examined today.  Feels breathing has improved. Denies chest pain, abdominal pain, diarrhea or constipation, dizziness, headache.   Objective:   Filed Vitals:   06/23/15 0710 06/23/15 0715 06/23/15 1122 06/23/15 1200  BP:  106/48  96/31  Pulse: 59 38  62  Temp:  98.6 F (37 C)  98.4 F (36.9 C)  TempSrc:  Oral  Oral  Resp: 17 15    Height:      Weight:      SpO2: 100% 100% 95% 92%    Wt Readings from Last 3 Encounters:  06/23/15 165.88 kg (365 lb 11.2 oz)     Intake/Output Summary (Last 24 hours) at 06/23/15 1216 Last data filed at 06/23/15 0700  Gross per 24 hour  Intake    480 ml  Output   2250 ml  Net  -1770 ml    Exam  General: Well developed, well nourished, obese, no distress  HEENT: NCAT, mucous membranes moist.   Cardiovascular: S1 S2 auscultated, 1/6 SEM. RRR  Respiratory: Clear to auscultation bilaterally  Abdomen: Soft, obese, nontender, nondistended, + bowel sounds  Extremities: warm dry without cyanosis clubbing. LE in unna boots, mild edema extending to thighs  Neuro: AAOx3, nonfocal  Psych: Appropriate mood and affect, pleasant  Data Review   Micro Results No results found for this or any previous visit (from the past 240 hour(s)).  Radiology Reports Dg Chest 2 View  06/12/2015  CLINICAL DATA:  65 year old female with shortness of breath and cough for the past month EXAM: CHEST  2 VIEW COMPARISON:  None.  FINDINGS: Left subclavian approach single lead cardiac rhythm maintenance device with the lead projected over the right ventricle. Cardiomegaly. Pulmonary vascular congestion with mild interstitial edema. Small bilateral pleural effusions with associated bibasilar atelectasis. No acute osseous abnormality. IMPRESSION: Mild-moderate CHF. Electronically Signed   By: Jacqulynn Cadet M.D.   On: 06/12/2015 20:23   Dg Chest Port 1 View  06/15/2015  CLINICAL DATA:  Evaluate line placement. EXAM: PORTABLE CHEST 1 VIEW COMPARISON:  06/12/2015 FINDINGS: Again noted is a single lead left cardiac pacemaker. Placement of a  right arm PICC line. The PICC catheter extends into the SVC but the tip is not well visualized. Heart is enlarged. Again noted are prominent central vascular structures. Trachea is midline. Negative for a pneumothorax. IMPRESSION: PICC line extends into the SVC region but the tip is not well visualized. Consider further evaluation with oblique images or lateral view. Again noted is cardiomegaly with enlarged central vascular structures. Findings are suggestive for vascular congestion or mild edema. Electronically Signed   By: Markus Daft M.D.   On: 06/15/2015 17:08    CBC  Recent Labs Lab 06/18/15 0455 06/19/15 0520 06/20/15 0510 06/22/15 0612 06/23/15 0445  WBC 6.6 6.6 5.5 5.1 5.4  HGB 8.5* 8.5* 8.4* 8.4* 7.8*  HCT 28.0* 27.9* 27.6* 27.0* 26.3*  PLT 136* 144* 137* 134* 139*  MCV 94.9 94.9 94.8 95.7 94.9  MCH 28.8 28.9 28.9 29.8 28.2  MCHC 30.4 30.5 30.4 31.1 29.7*  RDW 18.0* 17.9* 17.7* 17.7* 17.6*    Chemistries   Recent Labs Lab 06/17/15 0445  06/19/15 0520 06/20/15 0500 06/20/15 0510 06/21/15 0450 06/22/15 0612 06/23/15 0445  NA 142  < > 141  --  142 141 137 139  K 3.8  < > 3.4*  --  3.6 4.1 4.2 3.8  CL 100*  < > 94*  --  93* 92* 92* 95*  CO2 31  < > 34*  --  35* 35* 34* 33*  GLUCOSE 86  < > 93  --  93 93 92 93  BUN 56*  < > 53*  --  52* 54* 55* 60*  CREATININE  1.88*  < > 1.85*  --  1.79* 1.92* 1.89* 2.15*  CALCIUM 9.8  < > 9.2  --  9.5 9.5 9.2 9.2  MG 1.7  --   --  1.6*  --  1.9  --   --   < > = values in this interval not displayed. ------------------------------------------------------------------------------------------------------------------ estimated creatinine clearance is 40.3 mL/min (by C-G formula based on Cr of 2.15). ------------------------------------------------------------------------------------------------------------------ No results for input(s): HGBA1C in the last 72 hours. ------------------------------------------------------------------------------------------------------------------ No results for input(s): CHOL, HDL, LDLCALC, TRIG, CHOLHDL, LDLDIRECT in the last 72 hours. ------------------------------------------------------------------------------------------------------------------ No results for input(s): TSH, T4TOTAL, T3FREE, THYROIDAB in the last 72 hours.  Invalid input(s): FREET3 ------------------------------------------------------------------------------------------------------------------ No results for input(s): VITAMINB12, FOLATE, FERRITIN, TIBC, IRON, RETICCTPCT in the last 72 hours.  Coagulation profile No results for input(s): INR, PROTIME in the last 168 hours.  No results for input(s): DDIMER in the last 72 hours.  Cardiac Enzymes No results for input(s): CKMB, TROPONINI, MYOGLOBIN in the last 168 hours.  Invalid input(s): CK ------------------------------------------------------------------------------------------------------------------ Invalid input(s): POCBNP    Eilah Common D.O. on 06/23/2015 at 12:16 PM  Between 7am to 7pm - Pager - [phone]  After 7pm go to [email or web address] - password [email or web address]  And look for the night coverage person covering for me after hours  [facility] Office  [phone]

## 2015-06-24 DIAGNOSIS — I4729 Other ventricular tachycardia: Secondary | ICD-10-CM | POA: Insufficient documentation

## 2015-06-24 DIAGNOSIS — N184 Chronic kidney disease, stage 4 (severe): Secondary | ICD-10-CM | POA: Insufficient documentation

## 2015-06-24 DIAGNOSIS — I472 Ventricular tachycardia: Secondary | ICD-10-CM

## 2015-06-24 DIAGNOSIS — N189 Chronic kidney disease, unspecified: Secondary | ICD-10-CM

## 2015-06-24 LAB — CBC
HCT: 23.6 % — ABNORMAL LOW (ref 36.0–46.0)
Hemoglobin: 7.6 g/dL — ABNORMAL LOW (ref 12.0–15.0)
MCH: 30 pg (ref 26.0–34.0)
MCHC: 32.2 g/dL (ref 30.0–36.0)
MCV: 93.3 fL (ref 78.0–100.0)
PLATELETS: 125 10*3/uL — AB (ref 150–400)
RBC: 2.53 MIL/uL — AB (ref 3.87–5.11)
RDW: 17.4 % — AB (ref 11.5–15.5)
WBC: 5.8 10*3/uL (ref 4.0–10.5)

## 2015-06-24 LAB — GLUCOSE, CAPILLARY
GLUCOSE-CAPILLARY: 132 mg/dL — AB (ref 65–99)
Glucose-Capillary: 92 mg/dL (ref 65–99)
Glucose-Capillary: 91 mg/dL (ref 65–99)
Glucose-Capillary: 102 mg/dL — ABNORMAL HIGH (ref 65–99)

## 2015-06-24 LAB — BASIC METABOLIC PANEL
Anion gap: 12 (ref 5–15)
BUN: 62 mg/dL — AB (ref 6–20)
CALCIUM: 9.2 mg/dL (ref 8.9–10.3)
CO2: 33 mmol/L — ABNORMAL HIGH (ref 22–32)
CREATININE: 2.19 mg/dL — AB (ref 0.44–1.00)
Chloride: 94 mmol/L — ABNORMAL LOW (ref 101–111)
GFR calc non Af Amer: 22 mL/min — ABNORMAL LOW (ref >60)
GFR, EST AFRICAN AMERICAN: 26 mL/min — AB (ref >60)
Glucose, Bld: 93 mg/dL (ref 65–99)
Potassium: 3.7 mmol/L (ref 3.5–5.1)
SODIUM: 139 mmol/L (ref 135–145)

## 2015-06-24 LAB — ABO/RH: ABO/RH(D): B POS

## 2015-06-24 LAB — PREPARE RBC (CROSSMATCH)

## 2015-06-24 LAB — OCCULT BLOOD X 1 CARD TO LAB, STOOL: Fecal Occult Bld: POSITIVE — AB

## 2015-06-24 MED ORDER — INSULIN ASPART 100 UNIT/ML ~~LOC~~ SOLN
0.0000 [IU] | Freq: Three times a day (TID) | SUBCUTANEOUS | Status: DC
  Administered 2015-06-26: 2 [IU] via SUBCUTANEOUS

## 2015-06-24 MED ORDER — POTASSIUM CHLORIDE CRYS ER 20 MEQ PO TBCR
40.0000 meq | EXTENDED_RELEASE_TABLET | Freq: Once | ORAL | Status: AC
  Administered 2015-06-24: 40 meq via ORAL
  Filled 2015-06-24: qty 2

## 2015-06-24 MED ORDER — FERROUS SULFATE 325 (65 FE) MG PO TABS
650.0000 mg | ORAL_TABLET | Freq: Two times a day (BID) | ORAL | Status: DC
  Administered 2015-06-25: 650 mg via ORAL
  Filled 2015-06-24: qty 2

## 2015-06-24 MED ORDER — FUROSEMIDE 10 MG/ML IJ SOLN
40.0000 mg | Freq: Once | INTRAMUSCULAR | Status: AC
  Administered 2015-06-24: 40 mg via INTRAVENOUS
  Filled 2015-06-24: qty 4

## 2015-06-24 MED ORDER — SODIUM CHLORIDE 0.9 % IV SOLN
Freq: Once | INTRAVENOUS | Status: AC
  Administered 2015-06-24: 19:00:00 via INTRAVENOUS

## 2015-06-24 MED ORDER — MAGNESIUM SULFATE 2 GM/50ML IV SOLN
2.0000 g | Freq: Once | INTRAVENOUS | Status: AC
  Administered 2015-06-24: 2 g via INTRAVENOUS
  Filled 2015-06-24: qty 50

## 2015-06-24 NOTE — Progress Notes (Signed)
[facility]                                                                              Patient Demographics  Robyn Porter, is a 65 y.o. female, DOB - 1950-08-13, [id number]  Admit date - 06/12/2015   Admitting Physician [name], MD  Outpatient Primary MD for the patient is ADDIS,DANIEL, DO  LOS - 11   Chief Complaint  Patient presents with  . Shortness of Breath      HPI on 06/13/15 on [place] is a 65 y.o. female with history of diastolic CHF last EF measured in November 2016 was 50-55% presents to the ER because of increasing shortness of breath and increased weight. Patient states that over the last 1 month patient has been noticing increasing shortness of breath both on sitting and exertion. Denies any chest pain. Patient's Lasix was recently increased to 20 mg twice a day. Patient states she has been to the ER at least twice over the last 1 month. Patient was admitted at [facility] in November for Streptococcus bacteremia secondary to cellulitis at that time patient had 2-D echo and transesophageal echocardiogram which showed EF of 50-55%. At this time chest x-ray shows congestion and patient has been placed on Lasix and admitted for CHF. Patient denies any nausea vomiting abdominal pain or diarrhea.   Assessment & Plan   Acute on chronic systolic CHF  -Echocardiogram shows EF of 40-45% -Heart failure team consulted and appreciated -Lasix drip discontinued 06/23/15 -Currently on metolazone and torsemide (started 2/25).  -Continue to monitor intake and output, daily weight (weight down 60lbs since admit) -Urine output over the last 24 hours 1750 cc -Continue Unna boots- will replace today -Continue losartan and Toprol (doses decreased due to drops in BP with standing and dizziness) -?CVP up to 20 -Will give additional dose of lasix 40mg  IV today (following PRBC transfusion)  Acute bronchitits/COPD    -Improving -Continue Symbicort, nebs  Proteus mirabilis UTI -Continue ceftriaxone  Chronic atrial fibrillation -CHADSVASC 4 -Continue metoprolol and Xarelto  NSVT -Patient had 9beats of VT overnight. -Magnesium 1.7, will replace, goal 2 -Potassium 3.7, replace, goal 4  Diabetes mellitus, type II -Continue insulin sliding scale CBG monitoring  Acute on Chronic anemia -Anemia consistent with anemia of chronic disease -Hemoglobin 7.6 (steadily dropping) -Continue to monitor CBC -Will transfuse 1uPRBC -obtain FOBT  Obstructive sleep apnea -Continue CPAP  Acute on ?Chronic kidney disease, stage III -Unknown creatinine baseline -Creatinine 2.19 today  -Continue to monitor BMP closely as patient is being diuresed.  History pacemaker -Interrogated by Medtr[employer], pacing 85% of the time  Deconditioning -PT consulted recommended SNF  Code Status: Full  Family Communication: None at bedside  Disposition Plan: Admitted.  Continue diuresis. Transfuse 1uPRBC. Pending further recommendations from cardiology.  Time Spent in minutes   30 minutes  Procedures  Echocardiogram  Consults   Cardiology  DVT Prophylaxis  Xarelto  Lab Results  Component Value Date   PLT 125* 06/24/2015    Medications  Scheduled Meds: . sodium chloride   Intravenous Once  . allopurinol  100 mg Oral Daily  . antiseptic oral rinse  7  mL Mouth Rinse BID  . atorvastatin  20 mg Oral Daily  . budesonide-formoterol  2 puff Inhalation BID  . cholecalciferol  5,000 Units Oral Daily  . ferrous sulfate  650 mg Oral BID WC  . furosemide  40 mg Intravenous Once  . insulin aspart  0-9 Units Subcutaneous TID WC  . loratadine  10 mg Oral Daily  . losartan  12.5 mg Oral Daily  . magnesium sulfate 1 - 4 g bolus IVPB  2 g Intravenous Once  . metoprolol succinate  25 mg Oral Daily  . multivitamin with minerals  1 tablet Oral Daily  . potassium chloride  40 mEq Oral Once  . rivaroxaban  20 mg Oral Q  supper  . saccharomyces boulardii  250 mg Oral BID  . sodium chloride flush  10-40 mL Intracatheter Q12H  . sodium chloride flush  3 mL Intravenous Q12H  . torsemide  20 mg Oral Daily  . cyanocobalamin  500 mcg Oral Daily  . vitamin C  500 mg Oral Daily   Continuous Infusions:   PRN Meds:.acetaminophen **OR** acetaminophen, albuterol, benzonatate, ondansetron **OR** ondansetron (ZOFRAN) IV, sodium chloride flush  Antibiotics    Anti-infectives    Start     Dose/Rate Route Frequency Ordered Stop   06/15/15 1230  cefTRIAXone (ROCEPHIN) 1 g in dextrose 5 % 50 mL IVPB     1 g 100 mL/hr over 30 Minutes Intravenous Every 24 hours 06/15/15 1149 06/17/15 1317   06/13/15 0915  cefTRIAXone (ROCEPHIN) 1 g in dextrose 5 % 50 mL IVPB  Status:  Discontinued     1 g 100 mL/hr over 30 Minutes Intravenous Every 24 hours 06/13/15 0912 06/13/15 0915      Subjective:   Robyn Porter seen and examined today.  Feels breathing has improved. Denies chest pain, abdominal pain, diarrhea or constipation, dizziness, headache.   Objective:   Filed Vitals:   06/24/15 [id number] 06/24/15 0725 06/24/15 0816 06/24/15 0921  BP:    116/47  Pulse:    73  Temp: 98.7 F (37.1 C) 98.1 F (36.7 C)    TempSrc: Oral Oral    Resp:      Height:      Weight: 166.198 kg (366 lb 6.4 oz)     SpO2:   92%     Wt Readings from Last 3 Encounters:  06/24/15 166.198 kg (366 lb 6.4 oz)     Intake/Output Summary (Last 24 hours) at 06/24/15 1126 Last data filed at 06/24/15 [id number]  Gross per 24 hour  Intake      0 ml  Output   1750 ml  Net  -1750 ml    Exam  General: Well developed, well nourished, obese, no distress  HEENT: NCAT, mucous membranes moist.   Cardiovascular: S1 S2 auscultated, 1/6 SEM. RRR  Respiratory: Clear to auscultation bilaterally  Abdomen: Soft, obese, nontender, nondistended, + bowel sounds  Extremities: warm dry without cyanosis clubbing. LE in unna boots, mild edema extending to  thighs  Neuro: AAOx3, nonfocal  Psych: Appropriate mood and affect, pleasant  Data Review   Micro Results No results found for this or any previous visit (from the past 240 hour(s)).  Radiology Reports Dg Chest 2 View  06/12/2015  CLINICAL DATA:  65 year old female with shortness of breath and cough for the past month EXAM: CHEST  2 VIEW COMPARISON:  None. FINDINGS: Left subclavian approach single lead cardiac rhythm maintenance device with the lead projected over the right ventricle.  Cardiomegaly. Pulmonary vascular congestion with mild interstitial edema. Small bilateral pleural effusions with associated bibasilar atelectasis. No acute osseous abnormality. IMPRESSION: Mild-moderate CHF. Electronically Signed   By: Jacqulynn Cadet M.D.   On: 06/12/2015 20:23   Dg Chest Port 1 View  06/15/2015  CLINICAL DATA:  Evaluate line placement. EXAM: PORTABLE CHEST 1 VIEW COMPARISON:  06/12/2015 FINDINGS: Again noted is a single lead left cardiac pacemaker. Placement of a right arm PICC line. The PICC catheter extends into the SVC but the tip is not well visualized. Heart is enlarged. Again noted are prominent central vascular structures. Trachea is midline. Negative for a pneumothorax. IMPRESSION: PICC line extends into the SVC region but the tip is not well visualized. Consider further evaluation with oblique images or lateral view. Again noted is cardiomegaly with enlarged central vascular structures. Findings are suggestive for vascular congestion or mild edema. Electronically Signed   By: Markus Daft M.D.   On: 06/15/2015 17:08    CBC  Recent Labs Lab 06/19/15 0520 06/20/15 0510 06/22/15 0612 06/23/15 0445 06/24/15 0528  WBC 6.6 5.5 5.1 5.4 5.8  HGB 8.5* 8.4* 8.4* 7.8* 7.6*  HCT 27.9* 27.6* 27.0* 26.3* 23.6*  PLT 144* 137* 134* 139* 125*  MCV 94.9 94.8 95.7 94.9 93.3  MCH 28.9 28.9 29.8 28.2 30.0  MCHC 30.5 30.4 31.1 29.7* 32.2  RDW 17.9* 17.7* 17.7* 17.6* 17.4*    Chemistries    Recent Labs Lab 06/20/15 0500 06/20/15 0510 06/21/15 0450 06/22/15 0612 06/23/15 0445 06/23/15 2008 06/24/15 0528  NA  --  142 141 137 139  --  139  K  --  3.6 4.1 4.2 3.8  --  3.7  CL  --  93* 92* 92* 95*  --  94*  CO2  --  35* 35* 34* 33*  --  33*  GLUCOSE  --  93 93 92 93  --  93  BUN  --  52* 54* 55* 60*  --  62*  CREATININE  --  1.79* 1.92* 1.89* 2.15*  --  2.19*  CALCIUM  --  9.5 9.5 9.2 9.2  --  9.2  MG 1.6*  --  1.9  --   --  1.7  --    ------------------------------------------------------------------------------------------------------------------ estimated creatinine clearance is 39.6 mL/min (by C-G formula based on Cr of 2.19). ------------------------------------------------------------------------------------------------------------------ No results for input(s): HGBA1C in the last 72 hours. ------------------------------------------------------------------------------------------------------------------ No results for input(s): CHOL, HDL, LDLCALC, TRIG, CHOLHDL, LDLDIRECT in the last 72 hours. ------------------------------------------------------------------------------------------------------------------ No results for input(s): TSH, T4TOTAL, T3FREE, THYROIDAB in the last 72 hours.  Invalid input(s): FREET3 ------------------------------------------------------------------------------------------------------------------ No results for input(s): VITAMINB12, FOLATE, FERRITIN, TIBC, IRON, RETICCTPCT in the last 72 hours.  Coagulation profile No results for input(s): INR, PROTIME in the last 168 hours.  No results for input(s): DDIMER in the last 72 hours.  Cardiac Enzymes No results for input(s): CKMB, TROPONINI, MYOGLOBIN in the last 168 hours.  Invalid input(s): CK ------------------------------------------------------------------------------------------------------------------ Invalid input(s): POCBNP    Eimi Viney D.O. on 06/24/2015 at 11:26  AM  Between 7am to 7pm - Pager - [phone]  After 7pm go to [email or web address] - password [email or web address]  And look for the night coverage person covering for me after hours  [facility] Office  [phone]

## 2015-06-24 NOTE — Plan of Care (Signed)
Problem: Health Behavior/Discharge Planning: Goal: Ability to manage health-related needs will improve Outcome: Completed/Met Date Met:  06/24/15 Patient plan to discharge to SNF for further rehab

## 2015-06-24 NOTE — Progress Notes (Signed)
SUBJECTIVE: Feeling well. No complaints.     Intake/Output Summary (Last 24 hours) at 06/24/15 1253 Last data filed at 06/24/15 [id number]  Gross per 24 hour  Intake      0 ml  Output   1750 ml  Net  -1750 ml    Current Facility-Administered Medications  Medication Dose Route Frequency Provider Last Rate Last Dose  . 0.9 %  sodium chloride infusion   Intravenous Once [employer], DO      . acetaminophen (TYLENOL) tablet 650 mg  650 mg Oral Q6H PRN [name], MD   650 mg at 06/22/15 1028   Or  . acetaminophen (TYLENOL) suppository 650 mg  650 mg Rectal Q6H PRN [name], MD      . albuterol (PROVENTIL) (2.5 MG/3ML) 0.083% nebulizer solution 2.5 mg  2.5 mg Nebulization Q6H PRN [name], MD      . allopurinol (ZYLOPRIM) tablet 100 mg  100 mg Oral Daily [name], MD   100 mg at 06/24/15 [id number]  . antiseptic oral rinse (CPC / CETYLPYRIDINIUM CHLORIDE 0.05%) solution 7 mL  7 mL Mouth Rinse BID Ripudeep K Rai, MD   7 mL at 06/24/15 1000  . atorvastatin (LIPITOR) tablet 20 mg  20 mg Oral Daily [name], MD   20 mg at 06/24/15 [id number]  . benzonatate (TESSALON) capsule 200 mg  200 mg Oral TID PRN [name], MD      . budesonide-formoterol (SYMBICORT) 80-4.5 MCG/ACT inhaler 2 puff  2 puff Inhalation BID [name], MD   2 puff at 06/24/15 [phone]  . cholecalciferol (VITAMIN D) tablet 5,000 Units  5,000 Units Oral Daily [name], MD   5,000 Units at 06/24/15 [phone]  . ferrous sulfate tablet 650 mg  650 mg Oral BID WC [name], MD   650 mg at 06/24/15 [id number]  . furosemide (LASIX) injection 40 mg  40 mg Intravenous Once [employer], DO      . insulin aspart (novoLOG) injection 0-9 Units  0-9 Units Subcutaneous TID WC [name], MD   1 Units at 06/22/15 1223  . loratadine (CLARITIN) tablet 10 mg  10 mg Oral Daily [name], MD   10 mg at 06/24/15 [id number]  . losartan (COZAAR) tablet 12.5 mg   12.5 mg Oral Daily Larey Dresser, MD   12.5 mg at 06/24/15 [id number]  . metoprolol succinate (TOPROL-XL) 24 hr tablet 25 mg  25 mg Oral Daily Larey Dresser, MD   25 mg at 06/24/15 [id number]  . multivitamin with minerals tablet 1 tablet  1 tablet Oral Daily [name], MD   1 tablet at 06/24/15 [phone]  . ondansetron (ZOFRAN) tablet 4 mg  4 mg Oral Q6H PRN [name], MD       Or  . ondansetron [facility]) injection 4 mg  4 mg Intravenous Q6H PRN [name], MD      . rivaroxaban Alveda Reasons) tablet 20 mg  20 mg Oral Q supper [name], MD   20 mg at 06/23/15 1712  . saccharomyces boulardii (FLORASTOR) capsule 250 mg  250 mg Oral BID Ripudeep Krystal Eaton, MD   250 mg at 06/24/15 [id number]  . sodium chloride flush (NS) 0.9 % injection 10-40 mL  10-40 mL Intracatheter Q12H Ripudeep Krystal Eaton, MD   10 mL at 06/23/15 2146  . sodium chloride flush (NS) 0.9 %  injection 10-40 mL  10-40 mL Intracatheter PRN Ripudeep K Rai, MD      . sodium chloride flush (NS) 0.9 % injection 3 mL  3 mL Intravenous Q12H [name], MD   3 mL at 06/22/15 2154  . torsemide (DEMADEX) tablet 20 mg  20 mg Oral Daily Herminio Commons, MD   20 mg at 06/24/15 [id number]  . vitamin B-12 (CYANOCOBALAMIN) tablet 500 mcg  500 mcg Oral Daily [name], MD   500 mcg at 06/24/15 [phone]  . vitamin C (ASCORBIC ACID) tablet 500 mg  500 mg Oral Daily [name], MD   500 mg at 06/24/15 0921    Filed Vitals:   06/24/15 0725 06/24/15 0816 06/24/15 0921 06/24/15 1159  BP:   116/47 118/46  Pulse:   73 65  Temp: 98.1 F (36.7 C)   98.6 F (37 C)  TempSrc: Oral   Oral  Resp:    19  Height:      Weight:      SpO2:  92%  93%    PHYSICAL EXAM General:Morbidly Obese. Pleasant, NAD. No resp difficulty Psych: Normal affect. HEENT: Normalocephalic/atraumatic, without mass or lesion. Neck: Thick, JVP 10. No thyromegaly or nodule noted.  Heart: PMI nondisplaced. RRR. 2/6 SEM RUSB with clear S2,  distant heart sounds Lungs: Clear, no resp distress, no rales/wheezes. Abdomen: Morbidly obese. Extremities: No clubbing, cyanosis.1+ edema into thighs. Legs wrapped in bandages. Neuro: Alert and oriented X 3. Moves all extremities spontaneously.   Tele: Paced, NSVT   LABS: Basic Metabolic Panel:  Recent Labs  06/23/15 0445 06/23/15 2008 06/24/15 0528  NA 139  --  139  K 3.8  --  3.7  CL 95*  --  94*  CO2 33*  --  33*  GLUCOSE 93  --  93  BUN 60*  --  62*  CREATININE 2.15*  --  2.19*  CALCIUM 9.2  --  9.2  MG  --  1.7  --    Liver Function Tests: No results for input(s): AST, ALT, ALKPHOS, BILITOT, PROT, ALBUMIN in the last 72 hours. No results for input(s): LIPASE, AMYLASE in the last 72 hours. CBC:  Recent Labs  06/23/15 0445 06/24/15 0528  WBC 5.4 5.8  HGB 7.8* 7.6*  HCT 26.3* 23.6*  MCV 94.9 93.3  PLT 139* 125*   Cardiac Enzymes: No results for input(s): CKTOTAL, CKMB, CKMBINDEX, TROPONINI in the last 72 hours. BNP: Invalid input(s): POCBNP D-Dimer: No results for input(s): DDIMER in the last 72 hours. Hemoglobin A1C: No results for input(s): HGBA1C in the last 72 hours. Fasting Lipid Panel: No results for input(s): CHOL, HDL, LDLCALC, TRIG, CHOLHDL, LDLDIRECT in the last 72 hours. Thyroid Function Tests: No results for input(s): TSH, T4TOTAL, T3FREE, THYROIDAB in the last 72 hours.  Invalid input(s): FREET3 Anemia Panel: No results for input(s): VITAMINB12, FOLATE, FERRITIN, TIBC, IRON, RETICCTPCT in the last 72 hours.  RADIOLOGY: Dg Chest 2 View  06/12/2015  CLINICAL DATA:  65 year old female with shortness of breath and cough for the past month EXAM: CHEST  2 VIEW COMPARISON:  None. FINDINGS: Left subclavian approach single lead cardiac rhythm maintenance device with the lead projected over the right ventricle. Cardiomegaly. Pulmonary vascular congestion with mild interstitial edema. Small bilateral pleural effusions with associated bibasilar  atelectasis. No acute osseous abnormality. IMPRESSION: Mild-moderate CHF. Electronically Signed   By: Jacqulynn Cadet M.D.   On: 06/12/2015 20:23   Dg Chest [facility] [address]  06/15/2015  CLINICAL DATA:  Evaluate line placement. EXAM: PORTABLE CHEST 1 VIEW COMPARISON:  06/12/2015 FINDINGS: Again noted is a single lead left cardiac pacemaker. Placement of a right arm PICC line. The PICC catheter extends into the SVC but the tip is not well visualized. Heart is enlarged. Again noted are prominent central vascular structures. Trachea is midline. Negative for a pneumothorax. IMPRESSION: PICC line extends into the SVC region but the tip is not well visualized. Consider further evaluation with oblique images or lateral view. Again noted is cardiomegaly with enlarged central vascular structures. Findings are suggestive for vascular congestion or mild edema. Electronically Signed   By: Markus Daft M.D.   On: 06/15/2015 17:08      ASSESSMENT AND PLAN: 1. Acute on chronic systolic CHF  - Echo [id number] shows EF 40-45%, down from 50-55% reportedly in 02/2015. With no ACS symptoms, would hold off on ischemic work up with her CKD.  - Lasix drip d/c 2/25 and switched to torsemide 20 mg daily beginning today given rise in BUN/creatinine. - Continue metolazone 5 mg. - UNNA boots placed 06/19/15. -doubt accuracy of CVP/weight. -Will receive additional 40 mg IV Lasix after PRBC transfusion (spoke with PTH)  2. S/p Medtronic PPM - Interrogated by [employer], single chamber pacer, pacing 85% of the time. Battery and leads WNLs. - No complications.  - Chronic RV pacing may be contributing to cardiomyopathy, will need EP followup, consider LV lead though may be difficult with body habitus.   3. Chronic Afib - Continue Toprol XL 25 mg daily and Xarelto - This patients CHA2DS2-VASc Score and unadjusted Ischemic Stroke Rate (4.8 % per year) from a score of 4  4. COPD - Stable. Per primary - Denies smoking history.   - ?OHSgiven frequent desats. Not on chronic 02 but may need it.  5. OSA/ ? OHS component - Continue CPAP nightly   6. CKD stage III - Worsening BUN/creatinine.  - Switched to torsemide 20 mg daily beginning 2/26  7. Deconditioning - Working with PT. To SNF at discharge.   8. NSVT -aim to keep Mg>2, K>4.  9. Anemia -to receive PRBC transfusion today    Kate Sable, M.D., F.A.C.C.

## 2015-06-24 NOTE — Progress Notes (Signed)
Orthopedic Tech Progress Note Patient Details:  Robyn Porter 1951-04-23 [id number]  Ortho Devices Type of Ortho Device: Louretta Parma boot Ortho Device/Splint Location: Bilateral unnas boots Ortho Device/Splint Interventions: Application   [name] 06/24/2015, 1:24 PM

## 2015-06-24 NOTE — Progress Notes (Signed)
Patient's morning CVP 20.  [name] PA on floor and notified.  [name]

## 2015-06-24 NOTE — Plan of Care (Signed)
Problem: Activity: Goal: Capacity to carry out activities will improve Outcome: Progressing Up to the chair for most of the day

## 2015-06-25 DIAGNOSIS — Z7901 Long term (current) use of anticoagulants: Secondary | ICD-10-CM

## 2015-06-25 DIAGNOSIS — K921 Melena: Secondary | ICD-10-CM

## 2015-06-25 DIAGNOSIS — K648 Other hemorrhoids: Secondary | ICD-10-CM

## 2015-06-25 LAB — BASIC METABOLIC PANEL
ANION GAP: 13 (ref 5–15)
BUN: 68 mg/dL — AB (ref 6–20)
CHLORIDE: 96 mmol/L — AB (ref 101–111)
CO2: 33 mmol/L — ABNORMAL HIGH (ref 22–32)
Calcium: 9.3 mg/dL (ref 8.9–10.3)
Creatinine, Ser: 2.22 mg/dL — ABNORMAL HIGH (ref 0.44–1.00)
GFR calc Af Amer: 25 mL/min — ABNORMAL LOW (ref >60)
GFR calc non Af Amer: 22 mL/min — ABNORMAL LOW (ref >60)
GLUCOSE: 103 mg/dL — AB (ref 65–99)
POTASSIUM: 3.9 mmol/L (ref 3.5–5.1)
SODIUM: 142 mmol/L (ref 135–145)

## 2015-06-25 LAB — TYPE AND SCREEN
ABO/RH(D): B POS
Antibody Screen: NEGATIVE
UNIT DIVISION: 0

## 2015-06-25 LAB — GLUCOSE, CAPILLARY
GLUCOSE-CAPILLARY: 98 mg/dL (ref 65–99)
GLUCOSE-CAPILLARY: 111 mg/dL — AB (ref 65–99)
GLUCOSE-CAPILLARY: 113 mg/dL — AB (ref 65–99)
Glucose-Capillary: 108 mg/dL — ABNORMAL HIGH (ref 65–99)

## 2015-06-25 LAB — CBC
HEMATOCRIT: 27.8 % — AB (ref 36.0–46.0)
HEMOGLOBIN: 8.4 g/dL — AB (ref 12.0–15.0)
MCH: 27.9 pg (ref 26.0–34.0)
MCHC: 30.2 g/dL (ref 30.0–36.0)
MCV: 92.4 fL (ref 78.0–100.0)
Platelets: 131 10*3/uL — ABNORMAL LOW (ref 150–400)
RBC: 3.01 MIL/uL — ABNORMAL LOW (ref 3.87–5.11)
RDW: 18.2 % — AB (ref 11.5–15.5)
WBC: 6.9 10*3/uL (ref 4.0–10.5)

## 2015-06-25 MED ORDER — FERROUS SULFATE 325 (65 FE) MG PO TABS
325.0000 mg | ORAL_TABLET | Freq: Every day | ORAL | Status: DC
  Administered 2015-06-26: 325 mg via ORAL
  Filled 2015-06-25: qty 1

## 2015-06-25 MED ORDER — FUROSEMIDE 10 MG/ML IJ SOLN
80.0000 mg | Freq: Two times a day (BID) | INTRAMUSCULAR | Status: DC
  Administered 2015-06-25 (×2): 80 mg via INTRAVENOUS
  Filled 2015-06-25 (×2): qty 8

## 2015-06-25 MED ORDER — POTASSIUM CHLORIDE CRYS ER 20 MEQ PO TBCR
20.0000 meq | EXTENDED_RELEASE_TABLET | Freq: Two times a day (BID) | ORAL | Status: DC
  Administered 2015-06-25 – 2015-06-30 (×10): 20 meq via ORAL
  Filled 2015-06-25 (×11): qty 1

## 2015-06-25 NOTE — Progress Notes (Addendum)
Patient ID: Robyn Porter, female   DOB: [birth year], 65 y.o.   MRN: [id number]      Subjective:    Continues to improve and work with PT. Denies SOB. She is in good spirits and continues to say that she will stay as long as it takes to "be well".  She has chronic black stools on Iron. Nurse tech reports BRBPR with bowel movement this morning. Remains in bedside commode. Large amount of black stool with bright red blood in bowl.  She states she has a hx of hemorrhoids. Not noticed by nursing tech while cleaning her up.   BRBPR with fall in hemoglobin and 1 unit PRBCs on 2/26.   Out ~35 L overall. Nearly down 60 lbs from admit. Creatinine up slightly. Transitioned to torsemide 20 mg daily 06/24/15.  Objective:   Temp:  [98.1 F (36.7 C)-98.8 F (37.1 C)] 98.8 F (37.1 C) (02/27 0842) Pulse Rate:  [60-145] 145 (02/27 0445) Resp:  [17-23] 23 (02/27 0445) BP: (108-121)/(40-65) 113/49 mmHg (02/27 0445) SpO2:  [84 %-99 %] 95 % (02/27 0858) Weight:  [359 lb (162.841 kg)-365 lb 12.8 oz (165.926 kg)] 365 lb 12.8 oz (165.926 kg) (02/27 0632) Last BM Date: 06/23/15  Filed Weights   06/24/15 0548 06/25/15 0445 06/25/15 [id number]  Weight: 366 lb 6.4 oz (166.198 kg) 359 lb (162.841 kg) 365 lb 12.8 oz (165.926 kg)    Intake/Output Summary (Last 24 hours) at 06/25/15 0938 Last data filed at 06/25/15 [id number]  Gross per 24 hour  Intake   1745 ml  Output   2450 ml  Net   -705 ml    Telemetry: Reviewed personally, V-paced 60-70s  Exam: CVP: 12-13 General:Morbidly Obese.  Pleasant, NAD. No resp difficulty Psych: Normal affect. HEENT: Normalocephalic/atraumatic, without mass or lesion. Neck: Thick, JVP remains to jaw cm. Carotids 2+. No thyromegaly or nodule noted.  Heart: PMI nondisplaced. RRR. 2/6 SEM RUSB with clear S2, distant heart sounds Lungs: Clear, no resp distress Abdomen: Morbidly obese, NT ND, no HSM. No bruits or masses. +BS  Extremities: No clubbing, cyanosis. DP/PT/Radials  2+ and equal bilaterally. 1-2+ edema into thighs.  Neuro: Alert and oriented X 3. Moves all extremities spontaneously.   Basic Metabolic Panel:  Recent Labs Lab 06/20/15 0500  06/21/15 0450  06/23/15 0445 06/23/15 2008 06/24/15 0528 06/25/15 0455  NA  --   < > 141  < > 139  --  139 142  K  --   < > 4.1  < > 3.8  --  3.7 3.9  CL  --   < > 92*  < > 95*  --  94* 96*  CO2  --   < > 35*  < > 33*  --  33* 33*  GLUCOSE  --   < > 93  < > 93  --  93 103*  BUN  --   < > 54*  < > 60*  --  62* 68*  CREATININE  --   < > 1.92*  < > 2.15*  --  2.19* 2.22*  CALCIUM  --   < > 9.5  < > 9.2  --  9.2 9.3  MG 1.6*  --  1.9  --   --  1.7  --   --   < > = values in this interval not displayed.  Liver Function Tests: No results for input(s): AST, ALT, ALKPHOS, BILITOT, PROT, ALBUMIN in the last 168 hours.  CBC:  Recent Labs Lab  06/23/15 0445 06/24/15 0528 06/25/15 0455  WBC 5.4 5.8 6.9  HGB 7.8* 7.6* 8.4*  HCT 26.3* 23.6* 27.8*  MCV 94.9 93.3 92.4  PLT 139* 125* 131*    Cardiac Enzymes: No results for input(s): CKTOTAL, CKMB, CKMBINDEX, TROPONINI in the last 168 hours.  BNP: No results for input(s): PROBNP in the last 8760 hours.  Coagulation: No results for input(s): INR in the last 168 hours.  ECG:   Medications:   Scheduled Medications: . allopurinol  100 mg Oral Daily  . antiseptic oral rinse  7 mL Mouth Rinse BID  . atorvastatin  20 mg Oral Daily  . budesonide-formoterol  2 puff Inhalation BID  . cholecalciferol  5,000 Units Oral Daily  . ferrous sulfate  650 mg Oral BID WC  . insulin aspart  0-9 Units Subcutaneous TID WC  . loratadine  10 mg Oral Daily  . losartan  12.5 mg Oral Daily  . metoprolol succinate  25 mg Oral Daily  . multivitamin with minerals  1 tablet Oral Daily  . rivaroxaban  20 mg Oral Q supper  . saccharomyces boulardii  250 mg Oral BID  . sodium chloride flush  10-40 mL Intracatheter Q12H  . cyanocobalamin  500 mcg Oral Daily  . vitamin C  500 mg  Oral Daily    Infusions:    PRN Medications: acetaminophen **OR** acetaminophen, albuterol, benzonatate, ondansetron **OR** ondansetron (ZOFRAN) IV, sodium chloride flush     Assessment/Plan   Robyn Porter is a 65 y.o. female with history of diastolic CHF LVEF 99991111 in [place] 02/2015, COPD, and Asthma who presented to [facility] 06/12/15 with worsening SOB for several day  1. Acute on chronic systolic CHF: Echo 99991111 shows EF 40-45%, down from 50-55% reportedly in 02/2015.  With no ACS symptoms, would hold off on ischemic work up with her CKD. She remains volume overloaded with CVP 12-13 and edema into thighs.   - Will place back on IV lasix at a more gentle dose.  Will likely need to permit some azotemia to get her euvolemic. Will place on 80 mg IV lasix BID. May need transition to po tomorrow.  - K stable. Cr up slightly 1.89 -> 2.15 -> 2.19 -> 2.22. - UNNA boots placed 06/19/15. - Stop losartan with elevated creatinine, can continue Toprol XL at low dose.  2. S/p Medtronic PPM: Interrogated by Medtronic, single chamber pacer, pacing 85% of the time. Battery and leads WNLs. - Chronic RV pacing may be contributing to cardiomyopathy, will need EP followup, consider LV lead though may be difficult with body habitus.  3. Chronic Afib: Continue Toprol XL and Xarelto, but with fall in hgb and BRBPR will need GI evaluation. This patient's CHA2DS2-VASc Score and unadjusted Ischemic Stroke Rate (4.8 % per year)  from a score of 4.  - Need to make sure with pharmacy that Xarelto is being dosed properly for renal function.  4. OSA/ ? OHS component - Continue CPAP nightly, on oxygen during the day.   5. CKD stage IIi: Creatinine up slightly.  IV Lasix today with ongoing elevated CVP but probably will need to put back on po tomorrow. 6. Deconditioning - Working with PT. To SNF at discharge.  7. Anemia: Required 1 UPRBC 06/24/15.  BRBPR first reported this am. Chronically black stools on iron  supplement. FOBT+.  She is, of note, on Xarelto.   - Will consult GI. States she has had a normal colonoscopy in the past.   Satira Mccallum  [place] PA-C 9:38 AM   Advanced Heart Failure Team Pager [phone] (M-F; 7a - 4p)  Please contact [facility] for night-coverage after hours (4p -7a ) and weekends on [email or web address]  Patient seen with PA, agree with the above note.  Creatinine is slightly higher, still volume overloaded.  Agree with one more day of IV lasix, likely to torsemide in am.    BRBPR with anemia: 1 unit PRBCs yesterday.  Has been on Xarelto.  FOBT+.  Will get GI evaluation.  Hold Xarelto today, will reassess tomorrow and make sure that dosing is correct.    Stop losartan with rise in creatinine, continue Toprol XL.   [name] 06/25/2015 1:14 PM

## 2015-06-25 NOTE — Progress Notes (Signed)
CVP this am was 20, After a little while, and during MEDs, RN rechecked CVP 16-17. Will assess again at noon. Pt is up to chair. MD and HF team aware. PT also noted to have large amount of blood in stool this am. MD aware

## 2015-06-25 NOTE — Consult Note (Signed)
[facility] Consult: 11:28 AM 06/25/2015  LOS: 12 days    Referring Provider: Dr Trey Paula  Primary Care Physician:  ADDIS,DANIEL, DO in [place], [place] Primary Gastroenterologist:  Althia Forts.     Reason for Consultation:  Painless hematochezia.    HPI: Robyn Porter is a 65 y.o. female.  Morbidly obese, COPD, asthma. anemia on chronic po iron, chronic edema.  On xarelto for A fib.  S/p pacemaker. Streptococcal cellulitis with bacteremia 02/2015. Treated for + Cdiff in early 04/2015, diarrhea resolved. Previous colonoscopy ~ 10 years ago.  No pathology on routine screening.   Pt has occasional rare bleeding from hemorrhoids, sees blood with wiping and sometimes streaking in commode. Has taken po iron for many years.  In 04/2015, PMD doubled dose of po iron and pt has chronic dark stool.    Now dmitted with massive weight gain and fluid overload.  Transfer from  [place].   Has grown > 100 K proteus from urine. Treated with Rocephin 2/15 - 2/19.   Larger volume of blood per rectum noted this morning.  This accompanied formed, usual dark stool.  Hgb is 8.4.  Has fluctuated 9.4 to 7.6 this admission.  S/p 1 PRBC 2/26 for Hgb of 7.6.   Platelets ranging 118 to 144.  No abdominal pain.  No pyrosis.  Good appetite.  Occasional dysphagia to bread.   Brother diagnosed with colon cancer in his 54s.  Died within 2 years. He was 16 years her senior.    Past Medical History  Diagnosis Date  . COPD (chronic obstructive pulmonary disease) ([place])   . Hypertension   . Diabetes mellitus without complication ([place])   . Afib ([place])   . Pacemaker     Past Surgical History  Procedure Laterality Date  . Pacemaker insertion    . Abdominal hysterectomy      Prior to Admission medications   Medication Sig Start Date End Date  Taking? Authorizing Provider  albuterol (PROVENTIL HFA;VENTOLIN HFA) 108 (90 Base) MCG/ACT inhaler Inhale 1 puff into the lungs every 6 (six) hours as needed for wheezing or shortness of breath.   Yes Historical Provider, MD  albuterol (PROVENTIL) (2.5 MG/3ML) 0.083% nebulizer solution Take 2.5 mg by nebulization every 8 (eight) hours as needed for wheezing.  05/22/15  Yes Historical Provider, MD  allopurinol (ZYLOPRIM) 100 MG tablet Take 100 mg by mouth daily.   Yes Historical Provider, MD  atorvastatin (LIPITOR) 20 MG tablet Take 20 mg by mouth daily.   Yes Historical Provider, MD  benzonatate (TESSALON) 200 MG capsule Take 200 mg by mouth 3 (three) times daily as needed for cough.   Yes Historical Provider, MD  budesonide-formoterol (SYMBICORT) 80-4.5 MCG/ACT inhaler Inhale 2 puffs into the lungs 2 (two) times daily.   Yes Historical Provider, MD  BYDUREON 2 MG PEN Inject 2 mg as directed every 7 (seven) days. On Saturday 05/29/15  Yes Historical Provider, MD  Cholecalciferol (VITAMIN D3) 5000 units CAPS Take 1 capsule by mouth daily.   Yes Historical Provider, MD  cyanocobalamin 500 MCG tablet  Take 500 mcg by mouth daily.   Yes Historical Provider, MD  ferrous sulfate 325 (65 FE) MG tablet Take 650 mg by mouth 2 (two) times daily with a meal.   Yes Historical Provider, MD  furosemide (LASIX) 20 MG tablet Take 20 mg by mouth daily as needed for fluid.   Yes Historical Provider, MD  loratadine (CLARITIN) 10 MG tablet Take 10 mg by mouth daily.   Yes Historical Provider, MD  losartan (COZAAR) 50 MG tablet Take 50 mg by mouth daily.   Yes Historical Provider, MD  metoprolol (LOPRESSOR) 50 MG tablet Take 50 mg by mouth 2 (two) times daily.   Yes Historical Provider, MD  Multiple Vitamin (MULTIVITAMIN WITH MINERALS) TABS tablet Take 1 tablet by mouth daily.   Yes Historical Provider, MD  rivaroxaban (XARELTO) 20 MG TABS tablet Take 20 mg by mouth daily with supper.   Yes Historical Provider, MD  vitamin  C (ASCORBIC ACID) 500 MG tablet Take 500 mg by mouth daily.   Yes Historical Provider, MD    Scheduled Meds: . allopurinol  100 mg Oral Daily  . antiseptic oral rinse  7 mL Mouth Rinse BID  . atorvastatin  20 mg Oral Daily  . budesonide-formoterol  2 puff Inhalation BID  . cholecalciferol  5,000 Units Oral Daily  . ferrous sulfate  650 mg Oral BID WC  . furosemide  80 mg Intravenous BID  . insulin aspart  0-9 Units Subcutaneous TID WC  . loratadine  10 mg Oral Daily  . losartan  12.5 mg Oral Daily  . metoprolol succinate  25 mg Oral Daily  . multivitamin with minerals  1 tablet Oral Daily  . potassium chloride  20 mEq Oral BID  . rivaroxaban  20 mg Oral Q supper  . saccharomyces boulardii  250 mg Oral BID  . sodium chloride flush  10-40 mL Intracatheter Q12H  . cyanocobalamin  500 mcg Oral Daily  . vitamin C  500 mg Oral Daily   Infusions:   PRN Meds: acetaminophen **OR** acetaminophen, albuterol, benzonatate, ondansetron **OR** ondansetron (ZOFRAN) IV, sodium chloride flush   Allergies as of 06/12/2015 - Review Complete 06/12/2015  Allergen Reaction Noted  . Amoxicillin Hives 06/12/2015  . Contrast media [iodinated diagnostic agents] Hives 06/12/2015    Family History  Problem Relation Age of Onset  . Angina Sister   . Emphysema Sister   . Colon cancer Brother     Social History   Social History  . Marital Status: Married    Spouse Name: N/A  . Number of Children: N/A  . Years of Education: N/A   Occupational History  . Not on file.   Social History Main Topics  . Smoking status: Passive Smoke Exposure - Never Smoker  . Smokeless tobacco: Never Used  . Alcohol Use: No  . Drug Use: No  . Sexual Activity: Not on file   Other Topics Concern  . Not on file   Social History Narrative    REVIEW OF SYSTEMS: Constitutional:  Wheelchair mobility at home of late ENT:  No nose bleeds Pulm:  Stable, improved DOE, SOB CV:  No palpitations, no LE edema.  GU:   No hematuria, no frequency GI:  Per HPI Heme:  No unusual or excessive bleeding   Transfusions:  Remotely 30 yrs ago or more at time of  Dysfunctional uterine bleeding leading to her hysterectomy.  Neuro:  No headaches, no peripheral tingling or numbness Derm:  No itching, no  rash or sores.  Endocrine:  No sweats or chills.  No polyuria or dysuria Immunization:  Not queried.  Travel:  None beyond local counties in last few months.    PHYSICAL EXAM: Vital signs in last 24 hours: Filed Vitals:   06/25/15 0842 06/25/15 1113  BP: 113/49   Pulse: 85   Temp: 98.8 F (37.1 C) 98.6 F (37 C)  Resp: 23   Sats                   98% on RA Wt Readings from Last 3 Encounters:  06/25/15 165.926 kg (365 lb 12.8 oz)    General: morbidly obese EF.  Looks chronically unwell Head:  No facial asymmetry or signs of bleeding  Eyes:  No icterus or pallor Ears:  Not HOH  Nose:  No congestion or discharge Mouth:  Moist, clear oral MM Neck:  No mass, noJVD, no TMG but neck is obese and hard to read Lungs:  Clear bil.  No dyspnea Heart: distant sounds.  RRR.  Do not appreciate MRG  Abdomen:  Obese, soft, NT.  Not tender.  No hernia.  Erythematous skin folds.   Rectal: large external hemorrhoid, no blood visible.  Scant stool spec is dark and FOBT +.  No internal masses   Musc/Skeltl: no joint  erythema Extremities:  Massive bil LE edema.    Neurologic:  Oriented x 3.  Moves all 4 limbs.  No tremor.  Fully alert and appropriate Skin:  Intertrigo of abdominal skin folds Tattoos:  none   Psych:  In good spirits, not depressed.  Calm.   Intake/Output from previous day: 02/26 0701 - 02/27 0700 In: 1625 [P.O.:1200; Blood:335; IV Piggyback:50] Out: 2450 [Urine:2450] Intake/Output this shift: Total I/O In: 480 [P.O.:480] Out: -   LAB RESULTS:  Recent Labs  06/23/15 0445 06/24/15 0528 06/25/15 0455  WBC 5.4 5.8 6.9  HGB 7.8* 7.6* 8.4*  HCT 26.3* 23.6* 27.8*  PLT 139* 125* 131*    BMET Lab Results  Component Value Date   NA 142 06/25/2015   NA 139 06/24/2015   NA 139 06/23/2015   K 3.9 06/25/2015   K 3.7 06/24/2015   K 3.8 06/23/2015   CL 96* 06/25/2015   CL 94* 06/24/2015   CL 95* 06/23/2015   CO2 33* 06/25/2015   CO2 33* 06/24/2015   CO2 33* 06/23/2015   GLUCOSE 103* 06/25/2015   GLUCOSE 93 06/24/2015   GLUCOSE 93 06/23/2015   BUN 68* 06/25/2015   BUN 62* 06/24/2015   BUN 60* 06/23/2015   CREATININE 2.22* 06/25/2015   CREATININE 2.19* 06/24/2015   CREATININE 2.15* 06/23/2015   CALCIUM 9.3 06/25/2015   CALCIUM 9.2 06/24/2015   CALCIUM 9.2 06/23/2015   LFT No results for input(s): PROT, ALBUMIN, AST, ALT, ALKPHOS, BILITOT, BILIDIR, IBILI in the last 72 hours. PT/INR No results found for: INR, PROTIME Hepatitis Panel No results for input(s): HEPBSAG, HCVAB, HEPAIGM, HEPBIGM in the last 72 hours. C-Diff No components found for: CDIFF Lipase  No results found for: LIPASE  Drugs of Abuse  No results found for: LABOPIA, COCAINSCRNUR, LABBENZ, AMPHETMU, THCU, LABBARB   RADIOLOGY STUDIES: No results found.  ENDOSCOPIC STUDIES: Per HPI  IMPRESSION:   *  Hematochezia in pt on chronic Xarelto.  Hemorrhoids visible on exam, may be the source.  Overdue for colonoscopic screening and + family hx colon cancer (brother in his 50s)  *  A fib, chronic Xarelto.  Not on hold.   *  Long standing hx of anemia. MCV in 90s.  Dosing of po iron increased 04/2014 and yet again during current admission. Iron,  Ferritin, TIBC, Iron sats, B12 and Folate ok.    *  C diff +,  Treated and resolved diarrhea 04/2014.   *   Diastolic heart failure with acute volume overload, 60# diuresis thus far.   *  Thrombocytopenia.   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    *  Proteus UTI.  Treated with Rocephin x 5 days.   *  Morbid Obesity.     PLAN:     *  Per Dr Henrene Pastor.   *  Since iron labs ok, will resume lower dose of daily po iron.    Azucena Freed  06/25/2015, 11:28  AM Pager: [phone]  GI ATTENDING  HISTORY, LABORATORIES REVIEWED. PATIENT PERSONALLY SEEN AND EXAMINED. AGREE WITH COMPREHENSIVE CONSULTATION NOTE AS OUTLINED ABOVE. EXTREMELY COMPLICATED PATIENT WITH MULTIPLE SIGNIFICANT MEDICAL PROBLEMS INCLUDING HEART FAILURE, VOLUME OVERLOAD, MORBID OBESITY, ATRIAL FIBRILLATION ON ANTICOAGULATION, AND CHRONIC RENAL INSUFFICIENCY. WE ARE ASKED TO SEE HER FOR LOWER GI BLEEDING. KNOWN HEMORRHOIDS. NO COLONOSCOPY FOR 10 YEARS. HAS CHRONIC ANEMIA. NEW TO OUR [facility]. AS THE PATIENT WILL NEED LONG-TERM ANTICOAGULATION DUE TO THE RISK OF STROKE, IT WOULD BE IMPORTANT TO FURTHER EVALUATE HER RECTAL BLEEDING. MAY BE HEMORRHOIDAL, BUT NEED TO RULE OUT NEOPLASIA. WOULD PLAN TO PERFORM INPATIENT COLONOSCOPY UNDER THE MOST OPTIMAL CONDITIONS (MAXIMALLY TREATED HEART FAILURE, HOLD XERALTO FOR 3 DAYS GIVEN RENAL INSUFFICIENCY, AND MONITORED ANESTHESIA CARE FOR PROCEDURAL SEDATION). WE WILL FOLLOW.  Docia Chuck. Geri Seminole., M.D. [facility] Division of Gastroenterology

## 2015-06-25 NOTE — Progress Notes (Signed)
Physical Therapy Treatment Patient Details Name: Robyn Porter MRN: [id number] DOB: 27-Aug-1950 Today's Date: 06/25/2015    History of Present Illness Robyn Porter is a 65 y.o. female with history of diastolic CHF last EF measured in November 2016 was 50-55% presents to the ER because of increasing shortness of breath and increased weight. Patient states that over the last 1 month patient has been noticing increasing shortness of breath both on sitting and exertion. Denies any chest pain    PT Comments    Pt performed increased gait distance with close chair follow.  Pt performed with improved endurance but remains limited due to endurance.    Follow Up Recommendations  SNF;Supervision - Intermittent     Equipment Recommendations  None recommended by PT    Recommendations for Other Services       Precautions / Restrictions Precautions Precautions: Fall;ICD/Pacemaker Precaution Comments: Check BP Restrictions Weight Bearing Restrictions: No    Mobility  Bed Mobility Overal bed mobility:  (Pt recieved in recliner chair.  )                Transfers Overall transfer level: Needs assistance Equipment used: Rolling walker (2 wheeled) Transfers: Sit to/from Stand Sit to Stand: Min guard Stand pivot transfers: Min guard       General transfer comment: pt rocks and uses momentum with strong anterior lean.  pt holds her breath at times and encouraged to exhale with exertion of coming to standing.  pt demonstrates good use of RW with lengthy standing break to resolve dizziness before initiation of gait training.    Ambulation/Gait Ambulation/Gait assistance: Min guard Ambulation Distance (Feet): 16 Feet   Gait Pattern/deviations: Step-through pattern;Decreased stride length;Trunk flexed;Wide base of support     General Gait Details: Pt required close chair follow,  Cues for erect stance and forward progression of steps forward.     Stairs             Wheelchair Mobility    Modified Rankin (Stroke Patients Only)       Balance     Sitting balance-Leahy Scale: Good       Standing balance-Leahy Scale: Poor                      Cognition Arousal/Alertness: Awake/alert Behavior During Therapy: WFL for tasks assessed/performed Overall Cognitive Status: Within Functional Limits for tasks assessed                      Exercises      General Comments        Pertinent Vitals/Pain Pain Assessment: No/denies pain    Home Living                      Prior Function            PT Goals (current goals can now be found in the care plan section) Acute Rehab PT Goals Patient Stated Goal: get better to go home soon Potential to Achieve Goals: Fair Progress towards PT goals: Progressing toward goals    Frequency  Min 2X/week    PT Plan Current plan remains appropriate    Co-evaluation             End of Session Equipment Utilized During Treatment: Gait belt;Oxygen Activity Tolerance: Patient limited by fatigue Patient left: in chair;with call bell/phone within reach     Time: [id number] PT Time Calculation (min) (ACUTE ONLY): 25 min  Charges:  $Gait Training: 8-22 mins $Therapeutic Activity: 8-22 mins                    G Codes:      Cristela Blue July 07, 2015, 12:26 PM  Governor Rooks, PTA pager [phone]

## 2015-06-25 NOTE — Progress Notes (Signed)
ANTICOAGULATION CONSULT NOTE - Follow Up Consult  Pharmacy Consult for Rivaroxaban Indication: atrial fibrillation  Allergies  Allergen Reactions  . Amoxicillin Hives  . Contrast Media [Iodinated Diagnostic Agents] Hives    Patient Measurements: Height: 5\' 4"  (162.6 cm) Weight: (!) 365 lb 12.8 oz (165.926 kg) IBW/kg (Calculated) : 54.7   Vital Signs: Temp: 98.6 F (37 C) (02/27 1113) Temp Source: Oral (02/27 1113) BP: 113/49 mmHg (02/27 0445) Pulse Rate: 85 (02/27 1221)  Labs:  Recent Labs  06/23/15 0445 06/24/15 0528 06/25/15 0455  HGB 7.8* 7.6* 8.4*  HCT 26.3* 23.6* 27.8*  PLT 139* 125* 131*  CREATININE 2.15* 2.19* 2.22*    Estimated Creatinine Clearance: 39 mL/min (by C-G formula based on Cr of 2.22).     Assessment: 64yof with HF and Afib.  She states she has a hx of hemorrhoids and noticed BRBPR today with black stool (on po iron).  H/H low stable.   AC with rivaroxaban 20mg  daily.  Dose ok for CrCl 63ml/min using TBW 165kg and Cr 2.2  Goal of Therapy:   Monitor platelets by anticoagulation protocol: Yes   Plan:  Hold rivaroxaban tonight Restart rivaroxaban 20mg  daily 2/28 Monitor s/s bleeding  Bonnita Nasuti Pharm.D. CPP, BCPS Clinical Pharmacist [phone] 06/25/2015 1:37 PM     Robyn Porter 06/25/2015,1:28 PM

## 2015-06-25 NOTE — Progress Notes (Addendum)
[facility]                                                                              Patient Demographics  Robyn Porter, is a 65 y.o. female, DOB - 07/30/1950, [id number]  Admit date - 06/12/2015   Admitting Physician Robyn Patience, MD  Outpatient Primary MD for the patient is ADDIS,DANIEL, DO  LOS - 12   Chief Complaint  Patient presents with  . Shortness of Breath      HPI on 06/13/15 on [place] is a 65 y.o. female with history of diastolic CHF last EF measured in November 2016 was 50-55% presents to the ER because of increasing shortness of breath and increased weight. Patient states that over the last 1 month patient has been noticing increasing shortness of breath both on sitting and exertion. Denies any chest pain. Patient's Lasix was recently increased to 20 mg twice a day. Patient states she has been to the ER at least twice over the last 1 month. Patient was admitted at [facility] in November for Streptococcus bacteremia secondary to cellulitis at that time patient had 2-D echo and transesophageal echocardiogram which showed EF of 50-55%. At this time chest x-ray shows congestion and patient has been placed on Lasix and admitted for CHF. Patient denies any nausea vomiting abdominal pain or diarrhea.   Assessment & Plan   Acute on chronic systolic CHF  -Echocardiogram shows EF of 40-45% -Heart failure team consulted and appreciated -Lasix drip discontinued 06/23/15 -Currently on metolazone and torsemide (started 2/25).  -Continue to monitor intake and output, daily weight (weight down 60lbs since admit) -Continue Unna boots -Continue losartan and Toprol (doses decreased due to drops in BP with standing and dizziness) -CVP up to 17 -UOP over past 24hrs 2450cc (net -825) -On lasix 80mg  IV BID  Acute bronchitits/COPD  -Improving -Continue Symbicort, nebs  Acute on Chronic anemia/ Lower GI Bleed/  hematochezia -Hemoglobin was down to 7.6, given 1uPRBC -Hemoglobin 8.4 now -FOBT + -Possibly ?hemorrhoidal -D/c Xarelto  -Gastroenterology consulted -Patient has not had a colonoscopy in 10 years  -Continue to monitor CBC  Proteus mirabilis UTI -Continue ceftriaxone  Chronic atrial fibrillation -CHADSVASC 4 -Continue metoprolol   NSVT -Patient had 9beats of VT -Monitor Potassium/magnesium  Diabetes mellitus, type II -Continue insulin sliding scale CBG monitoring  Obstructive sleep apnea -Continue CPAP  Acute on ?Chronic kidney disease, stage III -Unknown creatinine baseline -Creatinine 2.22 today  -Continue to monitor BMP closely as patient is being diuresed.  History pacemaker -Interrogated by [employer], pacing 85% of the time  Deconditioning -PT consulted recommended SNF  Code Status: Full  Family Communication: None at bedside  Disposition Plan: Admitted.  Continue diuresis. Transfuse 1uPRBC. Pending further recommendations from cardiology.  Time Spent in minutes   30 minutes  Procedures  Echocardiogram  Consults   Cardiology  DVT Prophylaxis  SCDs  Lab Results  Component Value Date   PLT 131* 06/25/2015    Medications  Scheduled Meds: . allopurinol  100 mg Oral Daily  . antiseptic oral rinse  7 mL Mouth Rinse BID  . atorvastatin  20 mg Oral Daily  . budesonide-formoterol  2 puff Inhalation BID  . cholecalciferol  5,000 Units Oral Daily  . [START ON 06/26/2015] ferrous sulfate  325 mg Oral Q breakfast  . furosemide  80 mg Intravenous BID  . insulin aspart  0-9 Units Subcutaneous TID WC  . loratadine  10 mg Oral Daily  . metoprolol succinate  25 mg Oral Daily  . multivitamin with minerals  1 tablet Oral Daily  . potassium chloride  20 mEq Oral BID  . saccharomyces boulardii  250 mg Oral BID  . sodium chloride flush  10-40 mL Intracatheter Q12H  . cyanocobalamin  500 mcg Oral Daily  . vitamin C  500 mg Oral Daily   Continuous Infusions:    PRN Meds:.acetaminophen **OR** acetaminophen, albuterol, benzonatate, ondansetron **OR** ondansetron (ZOFRAN) IV, sodium chloride flush  Antibiotics    Anti-infectives    Start     Dose/Rate Route Frequency Ordered Stop   06/15/15 1230  cefTRIAXone (ROCEPHIN) 1 g in dextrose 5 % 50 mL IVPB     1 g 100 mL/hr over 30 Minutes Intravenous Every 24 hours 06/15/15 1149 06/17/15 1317   06/13/15 0915  cefTRIAXone (ROCEPHIN) 1 g in dextrose 5 % 50 mL IVPB  Status:  Discontinued     1 g 100 mL/hr over 30 Minutes Intravenous Every 24 hours 06/13/15 0912 06/13/15 0915      Subjective:   Robyn Porter seen and examined today.  Feels breathing has improved. Denies chest pain, abdominal pain, diarrhea or constipation, dizziness, headache. Had some blood in stool.  Objective:   Filed Vitals:   06/25/15 [id number] 06/25/15 0858 06/25/15 1113 06/25/15 1221  BP:      Pulse:    85  Temp: 98.8 F (37.1 C)  98.6 F (37 C)   TempSrc: Oral  Oral   Resp:      Height:      Weight:      SpO2:  95%  98%    Wt Readings from Last 3 Encounters:  06/25/15 165.926 kg (365 lb 12.8 oz)     Intake/Output Summary (Last 24 hours) at 06/25/15 1354 Last data filed at 06/25/15 1113  Gross per 24 hour  Intake   1775 ml  Output   3450 ml  Net  -1675 ml    Exam  General: Well developed, well nourished, obese, no distress  HEENT: NCAT, mucous membranes moist.   Cardiovascular: S1 S2 auscultated, 1/6 SEM. RRR  Respiratory: Clear to auscultation bilaterally  Abdomen: Soft, obese, nontender, nondistended, + bowel sounds  Extremities: warm dry without cyanosis clubbing. LE in unna boots  Neuro: AAOx3, nonfocal  Psych: Appropriate mood and affect, pleasant  Data Review   Micro Results No results found for this or any previous visit (from the past 240 hour(s)).  Radiology Reports Dg Chest 2 View  06/12/2015  CLINICAL DATA:  65 year old female with shortness of breath and cough for the past month  EXAM: CHEST  2 VIEW COMPARISON:  None. FINDINGS: Left subclavian approach single lead cardiac rhythm maintenance device with the lead projected over the right ventricle. Cardiomegaly. Pulmonary vascular congestion with mild interstitial edema. Small bilateral pleural effusions with associated bibasilar atelectasis. No acute osseous abnormality. IMPRESSION: Mild-moderate CHF. Electronically Signed   By: Jacqulynn Cadet M.D.   On: 06/12/2015 20:23   Dg Chest Port 1 View  06/15/2015  CLINICAL DATA:  Evaluate line placement. EXAM: PORTABLE CHEST 1 VIEW COMPARISON:  06/12/2015 FINDINGS: Again noted is a single lead left cardiac pacemaker. Placement  of a right arm PICC line. The PICC catheter extends into the SVC but the tip is not well visualized. Heart is enlarged. Again noted are prominent central vascular structures. Trachea is midline. Negative for a pneumothorax. IMPRESSION: PICC line extends into the SVC region but the tip is not well visualized. Consider further evaluation with oblique images or lateral view. Again noted is cardiomegaly with enlarged central vascular structures. Findings are suggestive for vascular congestion or mild edema. Electronically Signed   By: Markus Daft M.D.   On: 06/15/2015 17:08    CBC  Recent Labs Lab 06/20/15 0510 06/22/15 0612 06/23/15 0445 06/24/15 0528 06/25/15 0455  WBC 5.5 5.1 5.4 5.8 6.9  HGB 8.4* 8.4* 7.8* 7.6* 8.4*  HCT 27.6* 27.0* 26.3* 23.6* 27.8*  PLT 137* 134* 139* 125* 131*  MCV 94.8 95.7 94.9 93.3 92.4  MCH 28.9 29.8 28.2 30.0 27.9  MCHC 30.4 31.1 29.7* 32.2 30.2  RDW 17.7* 17.7* 17.6* 17.4* 18.2*    Chemistries   Recent Labs Lab 06/20/15 0500  06/21/15 0450 06/22/15 0612 06/23/15 0445 06/23/15 2008 06/24/15 0528 06/25/15 0455  NA  --   < > 141 137 139  --  139 142  K  --   < > 4.1 4.2 3.8  --  3.7 3.9  CL  --   < > 92* 92* 95*  --  94* 96*  CO2  --   < > 35* 34* 33*  --  33* 33*  GLUCOSE  --   < > 93 92 93  --  93 103*  BUN  --    < > 54* 55* 60*  --  62* 68*  CREATININE  --   < > 1.92* 1.89* 2.15*  --  2.19* 2.22*  CALCIUM  --   < > 9.5 9.2 9.2  --  9.2 9.3  MG 1.6*  --  1.9  --   --  1.7  --   --   < > = values in this interval not displayed. ------------------------------------------------------------------------------------------------------------------ estimated creatinine clearance is 39 mL/min (by C-G formula based on Cr of 2.22). ------------------------------------------------------------------------------------------------------------------ No results for input(s): HGBA1C in the last 72 hours. ------------------------------------------------------------------------------------------------------------------ No results for input(s): CHOL, HDL, LDLCALC, TRIG, CHOLHDL, LDLDIRECT in the last 72 hours. ------------------------------------------------------------------------------------------------------------------ No results for input(s): TSH, T4TOTAL, T3FREE, THYROIDAB in the last 72 hours.  Invalid input(s): FREET3 ------------------------------------------------------------------------------------------------------------------ No results for input(s): VITAMINB12, FOLATE, FERRITIN, TIBC, IRON, RETICCTPCT in the last 72 hours.  Coagulation profile No results for input(s): INR, PROTIME in the last 168 hours.  No results for input(s): DDIMER in the last 72 hours.  Cardiac Enzymes No results for input(s): CKMB, TROPONINI, MYOGLOBIN in the last 168 hours.  Invalid input(s): CK ------------------------------------------------------------------------------------------------------------------ Invalid input(s): POCBNP    Bana Borgmeyer D.O. on 06/25/2015 at 1:54 PM  Between 7am to 7pm - Pager - [phone]  After 7pm go to [email or web address] - password [email or web address]  And look for the night coverage person covering for me after hours  [facility] Office  [phone]

## 2015-06-25 NOTE — Plan of Care (Signed)
Problem: Activity: Goal: Capacity to carry out activities will improve Outcome: Progressing Patient finished transfusion with one unit PRBC around 21:40 and received lasix 40 mg IV x 1 dose post-transfusion. After one hour, indwelling Foley catheter was emptied; 800 ml urine output documented.  No reports of SOB or other problems with transfusion. CVP mildly elevated, but stable; was 15 mmHg pre-transfusion and 16 mmHg post.  Recent vital signs: Filed Vitals:    06/24/15 1926 06/24/15 2143 06/24/15 2335 06/24/15 2336  BP:   113/40   118/56  Pulse:   77 80 73  Temp:   98.5 F (36.9 C)   98.1 F (36.7 C)  TempSrc:   Oral   Oral  Resp:   20 20 17   Height:          Weight:          SpO2: 95% 99%   94%    Patient educated at bedside re:  Plan of care (CHF)  Tests/procedures/labs (reviewed results)  Electrophysiology consult  Possible plan to upgrade pacemaker to bi-ventricular as outpatient  Pacemaker functions (bi-ventricular)  Rationale for possible upgrade  Medications:  Furosemide  Florastor (sacchromyces boulardii)  Benzonatate  Need to notify RN for any symptoms  Reviewed signs/symptoms of transfusion reaction  Reviewed signs/symptoms of worsening heart failure  Safety plan  Need to call RN for assistance OOB  Activity progression  Continue working with PT/OT  OOB to chair as tolerated during daytime hours  Discharge plan (short-term SNF for rehabilitation)  Long-term goals  Adherence to medications/medical plan  Health preservation  Weight loss/increasing activity tolerance  Reduction of symptoms  Fewer exacerbations  Fewer hospital admissions  Patient is currently compliant with all ordered therapies and goals of medical management for CHF. She has good knowledge of basic heart failure principles, knows to check weight daily, monitor intake/output and limit fluids/salt in her diet and to take her medications as prescribed. She has  been attempting to make some changes in her activity level at home prior to admission, but has been unable to tolerate increasing activity due to acute heart failure exacerbation. She is aware that weight loss and continued management will be a key to successful preservation of her overall health; that mobility will need to increase as tolerated. She is willing to work toward all of these goals. She has good support structure at home that will help to ensure compliance once she is discharged to home from short-term, inpatient rehabilitation.  Progressing on-target for discharge to skilled nursing rehabilitation facility.  Continuing to monitor.

## 2015-06-26 DIAGNOSIS — D62 Acute posthemorrhagic anemia: Secondary | ICD-10-CM

## 2015-06-26 DIAGNOSIS — Z7901 Long term (current) use of anticoagulants: Secondary | ICD-10-CM | POA: Insufficient documentation

## 2015-06-26 DIAGNOSIS — K921 Melena: Secondary | ICD-10-CM | POA: Insufficient documentation

## 2015-06-26 LAB — BASIC METABOLIC PANEL
Anion gap: 13 (ref 5–15)
BUN: 71 mg/dL — AB (ref 6–20)
CALCIUM: 9.5 mg/dL (ref 8.9–10.3)
CO2: 32 mmol/L (ref 22–32)
CREATININE: 2.41 mg/dL — AB (ref 0.44–1.00)
Chloride: 95 mmol/L — ABNORMAL LOW (ref 101–111)
GFR calc Af Amer: 23 mL/min — ABNORMAL LOW (ref >60)
GFR, EST NON AFRICAN AMERICAN: 20 mL/min — AB (ref >60)
GLUCOSE: 111 mg/dL — AB (ref 65–99)
Potassium: 3.8 mmol/L (ref 3.5–5.1)
Sodium: 140 mmol/L (ref 135–145)

## 2015-06-26 LAB — GLUCOSE, CAPILLARY
GLUCOSE-CAPILLARY: 100 mg/dL — AB (ref 65–99)
GLUCOSE-CAPILLARY: 98 mg/dL (ref 65–99)
GLUCOSE-CAPILLARY: 124 mg/dL — AB (ref 65–99)
Glucose-Capillary: 184 mg/dL — ABNORMAL HIGH (ref 65–99)

## 2015-06-26 LAB — CBC
HCT: 26.3 % — ABNORMAL LOW (ref 36.0–46.0)
Hemoglobin: 8.1 g/dL — ABNORMAL LOW (ref 12.0–15.0)
MCH: 28.4 pg (ref 26.0–34.0)
MCHC: 30.8 g/dL (ref 30.0–36.0)
MCV: 92.3 fL (ref 78.0–100.0)
Platelets: 151 10*3/uL (ref 150–400)
RBC: 2.85 MIL/uL — ABNORMAL LOW (ref 3.87–5.11)
RDW: 17.9 % — AB (ref 11.5–15.5)
WBC: 8.4 10*3/uL (ref 4.0–10.5)

## 2015-06-26 LAB — MAGNESIUM: MAGNESIUM: 1.8 mg/dL (ref 1.7–2.4)

## 2015-06-26 MED ORDER — MAGNESIUM SULFATE 2 GM/50ML IV SOLN
2.0000 g | Freq: Once | INTRAVENOUS | Status: AC
  Administered 2015-06-26: 2 g via INTRAVENOUS
  Filled 2015-06-26: qty 50

## 2015-06-26 MED ORDER — TORSEMIDE 20 MG PO TABS
40.0000 mg | ORAL_TABLET | Freq: Every day | ORAL | Status: DC
  Administered 2015-06-26 – 2015-06-30 (×5): 40 mg via ORAL
  Filled 2015-06-26 (×5): qty 2

## 2015-06-26 NOTE — Progress Notes (Signed)
[facility]                                                                              Patient Demographics  Robyn Porter, is a 65 y.o. female, DOB - January 19, 1951, [id number]  Admit date - 06/12/2015   Admitting Physician [name], MD  Outpatient Primary MD for the patient is ADDIS,DANIEL, DO  LOS - 31   Chief Complaint  Patient presents with  . Shortness of Breath      HPI on 06/13/15 on [place] is a 65 y.o. female with history of diastolic CHF last EF measured in November 2016 was 50-55% presents to the ER because of increasing shortness of breath and increased weight. Patient states that over the last 1 month patient has been noticing increasing shortness of breath both on sitting and exertion. Denies any chest pain. Patient's Lasix was recently increased to 20 mg twice a day. Patient states she has been to the ER at least twice over the last 1 month. Patient was admitted at [facility] in November for Streptococcus bacteremia secondary to cellulitis at that time patient had 2-D echo and transesophageal echocardiogram which showed EF of 50-55%. At this time chest x-ray shows congestion and patient has been placed on Lasix and admitted for CHF. Patient denies any nausea vomiting abdominal pain or diarrhea.  Interim history Treating CHF (HF team), off of lasix drip, now on torsemide. Developed GI bleed. GI consulted, pending colonoscopy. Will eventually need SNF.  Assessment & Plan   Acute on chronic systolic CHF  -Echocardiogram shows EF of 40-45% -Heart failure team consulted and appreciated -Lasix drip and metolazone discontinued -Currently on torsemide -Continue to monitor intake and output, Robyn weight (weight down >60lbs since admit) -Continue Unna boots -Continue losartan and Toprol (doses decreased due to drops in BP with standing and dizziness) -CVP 15 today -UOP over past 24hrs 2200cc (net  -1240)  Acute bronchitits/COPD  -Improving -Continue Symbicort, nebs  Acute on Chronic anemia/ Lower GI Bleed/ hematochezia -Hemoglobin was down to 7.6, given 1uPRBC -Hemoglobin 8.1 today -FOBT + -Possibly ?hemorrhoidal -D/c Xarelto  -Patient has not had a colonoscopy in 10 years  -Gastroenterology consulted, plan for colonoscopy in a few days, needs to be off of Xarelto for 3 days -Continue to monitor CBC  Proteus mirabilis UTI -Continue ceftriaxone  Chronic atrial fibrillation -CHADSVASC 4 -Continue metoprolol   NSVT -Patient had 9beats of VT -Monitor Potassium/magnesium  Diabetes mellitus, type II -Continue insulin sliding scale CBG monitoring  Obstructive sleep apnea -Continue CPAP  Acute on ?Chronic kidney disease, stage III -Unknown creatinine baseline -Creatinine 2.41 today  -Continue to monitor BMP closely as patient is being diuresed.  History pacemaker -Interrogated by [employer], pacing 85% of the time  Deconditioning -PT consulted recommended SNF  Code Status: Full  Family Communication: None at bedside  Disposition Plan: Admitted.  Pending colonoscopy.   Time Spent in minutes   30 minutes  Procedures  Echocardiogram  Consults   Cardiology Gastroenterology  DVT Prophylaxis  SCDs  Lab Results  Component Value Date   PLT 151 06/26/2015    Medications  Scheduled Meds: . allopurinol  100  mg Oral Robyn  . antiseptic oral rinse  7 mL Mouth Rinse BID  . atorvastatin  20 mg Oral Robyn  . budesonide-formoterol  2 puff Inhalation BID  . cholecalciferol  5,000 Units Oral Robyn  . insulin aspart  0-9 Units Subcutaneous TID WC  . loratadine  10 mg Oral Robyn  . metoprolol succinate  25 mg Oral Robyn  . multivitamin with minerals  1 tablet Oral Robyn  . potassium chloride  20 mEq Oral BID  . saccharomyces boulardii  250 mg Oral BID  . sodium chloride flush  10-40 mL Intracatheter Q12H  . torsemide  40 mg Oral Robyn  . cyanocobalamin  500  mcg Oral Robyn  . vitamin C  500 mg Oral Robyn   Continuous Infusions:   PRN Meds:.acetaminophen **OR** acetaminophen, albuterol, benzonatate, ondansetron **OR** ondansetron (ZOFRAN) IV, sodium chloride flush  Antibiotics    Anti-infectives    Start     Dose/Rate Route Frequency Ordered Stop   06/15/15 1230  cefTRIAXone (ROCEPHIN) 1 g in dextrose 5 % 50 mL IVPB     1 g 100 mL/hr over 30 Minutes Intravenous Every 24 hours 06/15/15 1149 06/17/15 1317   06/13/15 0915  cefTRIAXone (ROCEPHIN) 1 g in dextrose 5 % 50 mL IVPB  Status:  Discontinued     1 g 100 mL/hr over 30 Minutes Intravenous Every 24 hours 06/13/15 0912 06/13/15 0915      Subjective:   [name] seen and examined today.  Feels breathing has improved. Denies chest pain, abdominal pain, diarrhea or constipation, dizziness, headache.   Objective:   Filed Vitals:   06/26/15 0700 06/26/15 0854 06/26/15 0949 06/26/15 1225  BP:  116/54  111/47  Pulse:  116  68  Temp:  97.8 F (36.6 C)  98 F (36.7 C)  TempSrc:  Oral  Oral  Resp:      Height:      Weight: 163.386 kg (360 lb 3.2 oz)     SpO2:  97% 97% 98%    Wt Readings from Last 3 Encounters:  06/26/15 163.386 kg (360 lb 3.2 oz)     Intake/Output Summary (Last 24 hours) at 06/26/15 1325 Last data filed at 06/26/15 0851  Gross per 24 hour  Intake    720 ml  Output   1850 ml  Net  -1130 ml    Exam  General: Well developed, well nourished, obese, no distress  HEENT: NCAT, mucous membranes moist.   Cardiovascular: S1 S2 auscultated, 2/6 SEM. RRR  Respiratory: Clear to auscultation bilaterally  Abdomen: Soft, obese, nontender, nondistended, + bowel sounds  Extremities: warm dry without cyanosis clubbing. LE in unna boots. LE edema extending to thighs  Neuro: AAOx3, nonfocal  Psych: Appropriate mood and affect, pleasant  Data Review   Micro Results No results found for this or any previous visit (from the past 240 hour(s)).  Radiology  Reports Dg Chest 2 View  06/12/2015  CLINICAL DATA:  65 year old female with shortness of breath and cough for the past month EXAM: CHEST  2 VIEW COMPARISON:  None. FINDINGS: Left subclavian approach single lead cardiac rhythm maintenance device with the lead projected over the right ventricle. Cardiomegaly. Pulmonary vascular congestion with mild interstitial edema. Small bilateral pleural effusions with associated bibasilar atelectasis. No acute osseous abnormality. IMPRESSION: Mild-moderate CHF. Electronically Signed   By: Jacqulynn Cadet M.D.   On: 06/12/2015 20:23   Dg Chest Port 1 View  06/15/2015  CLINICAL DATA:  Evaluate line  placement. EXAM: PORTABLE CHEST 1 VIEW COMPARISON:  06/12/2015 FINDINGS: Again noted is a single lead left cardiac pacemaker. Placement of a right arm PICC line. The PICC catheter extends into the SVC but the tip is not well visualized. Heart is enlarged. Again noted are prominent central vascular structures. Trachea is midline. Negative for a pneumothorax. IMPRESSION: PICC line extends into the SVC region but the tip is not well visualized. Consider further evaluation with oblique images or lateral view. Again noted is cardiomegaly with enlarged central vascular structures. Findings are suggestive for vascular congestion or mild edema. Electronically Signed   By: Markus Daft M.D.   On: 06/15/2015 17:08    CBC  Recent Labs Lab 06/22/15 0612 06/23/15 0445 06/24/15 0528 06/25/15 0455 06/26/15 0502  WBC 5.1 5.4 5.8 6.9 8.4  HGB 8.4* 7.8* 7.6* 8.4* 8.1*  HCT 27.0* 26.3* 23.6* 27.8* 26.3*  PLT 134* 139* 125* 131* 151  MCV 95.7 94.9 93.3 92.4 92.3  MCH 29.8 28.2 30.0 27.9 28.4  MCHC 31.1 29.7* 32.2 30.2 30.8  RDW 17.7* 17.6* 17.4* 18.2* 17.9*    Chemistries   Recent Labs Lab 06/20/15 0500  06/21/15 0450 06/22/15 0612 06/23/15 0445 06/23/15 2008 06/24/15 0528 06/25/15 0455 06/26/15 0502  NA  --   < > 141 137 139  --  139 142 140  K  --   < > 4.1 4.2 3.8   --  3.7 3.9 3.8  CL  --   < > 92* 92* 95*  --  94* 96* 95*  CO2  --   < > 35* 34* 33*  --  33* 33* 32  GLUCOSE  --   < > 93 92 93  --  93 103* 111*  BUN  --   < > 54* 55* 60*  --  62* 68* 71*  CREATININE  --   < > 1.92* 1.89* 2.15*  --  2.19* 2.22* 2.41*  CALCIUM  --   < > 9.5 9.2 9.2  --  9.2 9.3 9.5  MG 1.6*  --  1.9  --   --  1.7  --   --  1.8  < > = values in this interval not displayed. ------------------------------------------------------------------------------------------------------------------ estimated creatinine clearance is 35.6 mL/min (by C-G formula based on Cr of 2.41). ------------------------------------------------------------------------------------------------------------------ No results for input(s): HGBA1C in the last 72 hours. ------------------------------------------------------------------------------------------------------------------ No results for input(s): CHOL, HDL, LDLCALC, TRIG, CHOLHDL, LDLDIRECT in the last 72 hours. ------------------------------------------------------------------------------------------------------------------ No results for input(s): TSH, T4TOTAL, T3FREE, THYROIDAB in the last 72 hours.  Invalid input(s): FREET3 ------------------------------------------------------------------------------------------------------------------ No results for input(s): VITAMINB12, FOLATE, FERRITIN, TIBC, IRON, RETICCTPCT in the last 72 hours.  Coagulation profile No results for input(s): INR, PROTIME in the last 168 hours.  No results for input(s): DDIMER in the last 72 hours.  Cardiac Enzymes No results for input(s): CKMB, TROPONINI, MYOGLOBIN in the last 168 hours.  Invalid input(s): CK ------------------------------------------------------------------------------------------------------------------ Invalid input(s): POCBNP    Tacuma Graffam D.O. on 06/26/2015 at 1:25 PM  Between 7am to 7pm - Pager - [phone]  After 7pm go to  [email or web address] - password [email or web address]  And look for the night coverage person covering for me after hours  [facility] Office  [phone]

## 2015-06-26 NOTE — Progress Notes (Addendum)
Patient ID: Robyn Porter, female   DOB: [birth year], 65 y.o.   MRN: [id number]      Subjective:    Continues to improve and work with PT. A little SOB this am, fell asleep without her CPAP. Continues to feel better overall.   BRBPR with fall in hemoglobin and 1 unit PRBCs on 2/26.   Out ~37 L overall. Nearly down 63 lbs from admit. Creatinine up slightly with 80 mg IV lasix BID yesterday.  CVP up to 15 on my check today, despite continued diuresis (up from 12-13 yesterday)  Objective:   Temp:  [98.1 F (36.7 C)-98.6 F (37 C)] 98.1 F (36.7 C) (02/28 0456) Pulse Rate:  [60-85] 60 (02/28 0456) Resp:  [18] 18 (02/27 1633) BP: (107-122)/(38-53) 107/46 mmHg (02/28 0456) SpO2:  [94 %-100 %] 97 % (02/28 0456) Weight:  [360 lb 3.2 oz (163.386 kg)] 360 lb 3.2 oz (163.386 kg) (02/28 0700) Last BM Date: 06/25/15  Filed Weights   06/25/15 0445 06/25/15 [id number] 06/26/15 0700  Weight: 359 lb (162.841 kg) 365 lb 12.8 oz (165.926 kg) 360 lb 3.2 oz (163.386 kg)    Intake/Output Summary (Last 24 hours) at 06/26/15 0847 Last data filed at 06/26/15 0700  Gross per 24 hour  Intake    480 ml  Output   2850 ml  Net  -2370 ml    Telemetry: Reviewed personally, V-paced 60-70s  Exam: CVP: 15 with multiple rechecks, position changes, and zeroing General:Morbidly Obese.  Pleasant, NAD. No resp difficulty Psych: Normal affect. HEENT: Normalocephalic/atraumatic, without mass or lesion. Neck: Thick, JVP remains elevated to jaw. Carotids 2+. No thyromegaly or nodule noted.  Heart: PMI nondisplaced. RRR. 2/6 SEM RUSB with clear S2, distant heart sounds Lungs: Clear, no resp distress Abdomen: Morbidly obese, NT ND, no HSM. No bruits or masses. +BS  Extremities: No clubbing, cyanosis. DP/PT/Radials 2+ and equal bilaterally. 2+ edema into thighs.  Neuro: Alert and oriented X 3. Moves all extremities spontaneously.   Basic Metabolic Panel:  Recent Labs Lab 06/21/15 0450  06/23/15 2008  06/24/15 0528 06/25/15 0455 06/26/15 0502  NA 141  < >  --  139 142 140  K 4.1  < >  --  3.7 3.9 3.8  CL 92*  < >  --  94* 96* 95*  CO2 35*  < >  --  33* 33* 32  GLUCOSE 93  < >  --  93 103* 111*  BUN 54*  < >  --  62* 68* 71*  CREATININE 1.92*  < >  --  2.19* 2.22* 2.41*  CALCIUM 9.5  < >  --  9.2 9.3 9.5  MG 1.9  --  1.7  --   --  1.8  < > = values in this interval not displayed.  Liver Function Tests: No results for input(s): AST, ALT, ALKPHOS, BILITOT, PROT, ALBUMIN in the last 168 hours.  CBC:  Recent Labs Lab 06/24/15 0528 06/25/15 0455 06/26/15 0502  WBC 5.8 6.9 8.4  HGB 7.6* 8.4* 8.1*  HCT 23.6* 27.8* 26.3*  MCV 93.3 92.4 92.3  PLT 125* 131* 151    Cardiac Enzymes: No results for input(s): CKTOTAL, CKMB, CKMBINDEX, TROPONINI in the last 168 hours.  BNP: No results for input(s): PROBNP in the last 8760 hours.  Coagulation: No results for input(s): INR in the last 168 hours.  ECG:   Medications:   Scheduled Medications: . allopurinol  100 mg Oral Daily  . antiseptic oral rinse  7 mL Mouth Rinse BID  . atorvastatin  20 mg Oral Daily  . budesonide-formoterol  2 puff Inhalation BID  . cholecalciferol  5,000 Units Oral Daily  . ferrous sulfate  325 mg Oral Q breakfast  . insulin aspart  0-9 Units Subcutaneous TID WC  . loratadine  10 mg Oral Daily  . metoprolol succinate  25 mg Oral Daily  . multivitamin with minerals  1 tablet Oral Daily  . potassium chloride  20 mEq Oral BID  . saccharomyces boulardii  250 mg Oral BID  . sodium chloride flush  10-40 mL Intracatheter Q12H  . cyanocobalamin  500 mcg Oral Daily  . vitamin C  500 mg Oral Daily    Infusions:    PRN Medications: acetaminophen **OR** acetaminophen, albuterol, benzonatate, ondansetron **OR** ondansetron (ZOFRAN) IV, sodium chloride flush     Assessment/Plan   Chauna Lashley is a 65 y.o. female with history of diastolic CHF LVEF 99991111 in [place] 02/2015, COPD, and Asthma who  presented to [facility] 06/12/15 with worsening SOB for several day  1. Acute on chronic systolic CHF: Echo 99991111 shows EF 40-45%, down from 50-55% reportedly in 02/2015.  With no ACS symptoms, would hold off on ischemic work up with her CKD.  - She remains volume overloaded with  edema into thighs.  CVP 15 with multiple zeroes and repositioning.  - She continued to diurese well with IV lasix 80 mg BID yesterday but creatinine continues to trend up. Will give 40 mg torsemide today and see how she does.  - K relatively stable. Cr up slightly 1.89 -> 2.15 -> 2.19 -> 2.22 -> 2.41 - UNNA boots placed 06/19/15. - Losartan on hold with elevated creatinine - Continue Toprol XL at low dose.  2. S/p Medtronic PPM: Interrogated by Medtronic, single chamber pacer, pacing 85% of the time. Battery and leads WNLs. - Chronic RV pacing may be contributing to cardiomyopathy, will need EP followup, consider LV lead though may be difficult with body habitus.  3. Chronic Afib: Continue Toprol XL and Xarelto, but with fall in hgb and BRBPR will need GI evaluation. This patient's CHA2DS2-VASc Score and unadjusted Ischemic Stroke Rate (4.8 % per year)  from a score of 4.  - Xarelto held with lower GI bleeding.  4. OSA/ ? OHS component - Continue CPAP nightly, on oxygen during the day.   5. CKD stage III: Creatinine up slightly. Stopping IV diuretic.  6. Deconditioning - Working with PT. To SNF at discharge.  7. Anemia: Required 1 UPRBC 06/24/15.  BRBPR first reported 2/27. Chronically black stools on iron supplement. FOBT+.   - GI plans inpatient colonoscopy. Want to hold Xarelto x 3 days. Held last night. So hope she can get done later this week.  8. Murmur: Mild AS, moderate AI on echo.   Shirley Friar PA-C 8:47 AM   Advanced Heart Failure Team Pager [phone] (M-F; 7a - 4p)  Please contact [facility] for night-coverage after hours (4p -7a ) and weekends on [email or web address]  Patient seen with PA, agree with  the above note.  Creatinine up some.  CVP remains high, but with rise in creatinine and 60+ lb weight loss will transition to po torsemide.   Lower GI bleeding, ?hemorrhoidal.  Holding Xarelto, will need inpatient c-scope later this week.   To SNF after colonoscopy.   [name] 06/26/2015 10:04 AM   Spoke with Dr. Aundra Dubin. Miss Am is clear for sedation for her colonoscopy.   Legrand Como "  Jonni Sanger [place], [place] 06/26/2015 12:37 PM

## 2015-06-26 NOTE — Clinical Social Work Note (Signed)
Patient's [employer] authorization ended today for SNF. [facility] states that they will have to re-initiate authorization for SNF and will need updates on patient's progress every 48 hrs.  Liz Beach MSW, [place], [place], [id number]

## 2015-06-26 NOTE — Progress Notes (Signed)
Please see HF progress note 06/26/15.  Pt cleared for sedation for colonoscopy per HF team.   Beryle Beams" [place], PA-C 06/26/2015 1:07 PM

## 2015-06-26 NOTE — Progress Notes (Signed)
Daily Rounding Note  06/26/2015, 11:26 AM  LOS: 13 days   SUBJECTIVE:       xarelto discontinued, last dose was 2/26 at 1700.   Breathing overall improved.  Has not required [facility] oxygen for > 24 hours.  A bit dizzy with walking in the room.  BMs brown.   OBJECTIVE:         Vital signs in last 24 hours:    Temp:  [97.8 F (36.6 C)-98.4 F (36.9 C)] 97.8 F (36.6 C) (02/28 0854) Pulse Rate:  [60-116] 116 (02/28 0854) Resp:  [18] 18 (02/27 1633) BP: (107-122)/(38-54) 116/54 mmHg (02/28 0854) SpO2:  [94 %-100 %] 97 % (02/28 0949) Weight:  [163.386 kg (360 lb 3.2 oz)] 163.386 kg (360 lb 3.2 oz) (02/28 0700) Last BM Date: 06/25/15 Filed Weights   06/25/15 0445 06/25/15 [id number] 06/26/15 0700  Weight: 162.841 kg (359 lb) 165.926 kg (365 lb 12.8 oz) 163.386 kg (360 lb 3.2 oz)   General: pleasant, comfortable.  Massively obese   Heart: RRR.  Pacemaker on the upper left chest.  Chest: clear bil.  No cough or dyspnea Abdomen: soft,  Obese, NT, ND.    Extremities: no CCE Neuro/Psych:  Oriented x 3.  No obvious weakness.   Intake/Output from previous day: 02/27 0701 - 02/28 0700 In: 960 [P.O.:960] Out: 2850 [Urine:2850]  Intake/Output this shift: Total I/O In: 480 [P.O.:480] Out: -   Lab Results:  Recent Labs  06/24/15 0528 06/25/15 0455 06/26/15 0502  WBC 5.8 6.9 8.4  HGB 7.6* 8.4* 8.1*  HCT 23.6* 27.8* 26.3*  PLT 125* 131* 151   BMET  Recent Labs  06/24/15 0528 06/25/15 0455 06/26/15 0502  NA 139 142 140  K 3.7 3.9 3.8  CL 94* 96* 95*  CO2 33* 33* 32  GLUCOSE 93 103* 111*  BUN 62* 68* 71*  CREATININE 2.19* 2.22* 2.41*  CALCIUM 9.2 9.3 9.5   LFT No results for input(s): PROT, ALBUMIN, AST, ALT, ALKPHOS, BILITOT, BILIDIR, IBILI in the last 72 hours. PT/INR No results for input(s): LABPROT, INR in the last 72 hours. Hepatitis Panel No results for input(s): HEPBSAG, HCVAB, HEPAIGM, HEPBIGM in the last  72 hours.  Studies/Results: No results found.  Scheduled Meds: . allopurinol  100 mg Oral Daily  . antiseptic oral rinse  7 mL Mouth Rinse BID  . atorvastatin  20 mg Oral Daily  . budesonide-formoterol  2 puff Inhalation BID  . cholecalciferol  5,000 Units Oral Daily  . ferrous sulfate  325 mg Oral Q breakfast  . insulin aspart  0-9 Units Subcutaneous TID WC  . loratadine  10 mg Oral Daily  . magnesium sulfate 1 - 4 g bolus IVPB  2 g Intravenous Once  . metoprolol succinate  25 mg Oral Daily  . multivitamin with minerals  1 tablet Oral Daily  . potassium chloride  20 mEq Oral BID  . saccharomyces boulardii  250 mg Oral BID  . sodium chloride flush  10-40 mL Intracatheter Q12H  . torsemide  40 mg Oral Daily  . cyanocobalamin  500 mcg Oral Daily  . vitamin C  500 mg Oral Daily   Continuous Infusions:  PRN Meds:.acetaminophen **OR** acetaminophen, albuterol, benzonatate, ondansetron **OR** ondansetron (ZOFRAN) IV, sodium chloride flush  ASSESMENT:   * Hematochezia in pt on chronic Xarelto. Hemorrhoids visible on exam, may be the source. no recurrent bleeding. Overdue for colonoscopic screening and + family hx  colon cancer (brother in his 76s)  * A fib, chronic Xarelto. Not on hold.   * Long standing hx of anemia. MCV in 90s. Dosing of po iron increased 04/2014 and yet again during current admission. Iron, Ferritin, TIBC, Iron sats, B12 and Folate ok.   * C diff +, Treated and resolved diarrhea 04/2014.   * Diastolic heart failure with acute volume overload, 60# diuresis thus far.   * Thrombocytopenia.   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   * Proteus UTI. Treated with Rocephin x 5 days.   * Morbid Obesity    PLAN   *  Colonoscopy, plan for 3/2 if MAC sedation available and cardiopulmonary status stable.  Dr Aundra Dubin following and needs give his clearance for the colonoscopy.    *  Stop oral iron in anticipation of bowel prep.    *  As she is off Xarelto, will be ok  if cards wishes to start interval Heparin.      Azucena Freed  06/26/2015, 11:26 AM Pager: [phone]  GI ATTENDING  Interval history data reviewed. Patient seen and examined. Agree with interval progress note. No significant change clinically from GI standpoint. Blood count stable. Plan colonoscopy as discussed yesterday. We would appreciate cardiology clearance such that the patient's clinical statuses optimized.  Docia Chuck. Geri Seminole., M.D. [facility] Division of Gastroenterology

## 2015-06-26 NOTE — Progress Notes (Signed)
Pt rested well today, was in chair most of day. Pt has signed consent for colonoscopy. Pt has c/o hemorrhoids feeling agitated, and states repositioning is helping for now. Will continue to monitor

## 2015-06-27 DIAGNOSIS — I509 Heart failure, unspecified: Secondary | ICD-10-CM

## 2015-06-27 DIAGNOSIS — I5021 Acute systolic (congestive) heart failure: Secondary | ICD-10-CM

## 2015-06-27 DIAGNOSIS — D62 Acute posthemorrhagic anemia: Secondary | ICD-10-CM

## 2015-06-27 LAB — GLUCOSE, CAPILLARY
GLUCOSE-CAPILLARY: 100 mg/dL — AB (ref 65–99)
GLUCOSE-CAPILLARY: 106 mg/dL — AB (ref 65–99)
GLUCOSE-CAPILLARY: 85 mg/dL (ref 65–99)
GLUCOSE-CAPILLARY: 85 mg/dL (ref 65–99)

## 2015-06-27 LAB — BASIC METABOLIC PANEL
ANION GAP: 12 (ref 5–15)
BUN: 68 mg/dL — ABNORMAL HIGH (ref 6–20)
CALCIUM: 9.3 mg/dL (ref 8.9–10.3)
CO2: 32 mmol/L (ref 22–32)
Chloride: 95 mmol/L — ABNORMAL LOW (ref 101–111)
Creatinine, Ser: 2.41 mg/dL — ABNORMAL HIGH (ref 0.44–1.00)
GFR, EST AFRICAN AMERICAN: 23 mL/min — AB (ref >60)
GFR, EST NON AFRICAN AMERICAN: 20 mL/min — AB (ref >60)
GLUCOSE: 97 mg/dL (ref 65–99)
POTASSIUM: 3.9 mmol/L (ref 3.5–5.1)
Sodium: 139 mmol/L (ref 135–145)

## 2015-06-27 LAB — CBC
HEMATOCRIT: 24.8 % — AB (ref 36.0–46.0)
HEMOGLOBIN: 7.7 g/dL — AB (ref 12.0–15.0)
MCH: 28.9 pg (ref 26.0–34.0)
MCHC: 31 g/dL (ref 30.0–36.0)
MCV: 93.2 fL (ref 78.0–100.0)
Platelets: 147 10*3/uL — ABNORMAL LOW (ref 150–400)
RBC: 2.66 MIL/uL — AB (ref 3.87–5.11)
RDW: 17.8 % — ABNORMAL HIGH (ref 11.5–15.5)
WBC: 7.5 10*3/uL (ref 4.0–10.5)

## 2015-06-27 MED ORDER — BISACODYL 5 MG PO TBEC
10.0000 mg | DELAYED_RELEASE_TABLET | Freq: Two times a day (BID) | ORAL | Status: AC
  Administered 2015-06-27 – 2015-06-28 (×2): 10 mg via ORAL
  Filled 2015-06-27 (×2): qty 2

## 2015-06-27 MED ORDER — PEG-KCL-NACL-NASULF-NA ASC-C 100 G PO SOLR
0.5000 | Freq: Once | ORAL | Status: DC
  Filled 2015-06-27: qty 1

## 2015-06-27 MED ORDER — PEG-KCL-NACL-NASULF-NA ASC-C 100 G PO SOLR: 1.0000 | Freq: Once | ORAL | Status: DC

## 2015-06-27 NOTE — Progress Notes (Signed)
Patient ID: Robyn Porter, female   DOB: 1951/02/01, 65 y.o.   MRN: [id number]      Subjective:    Feels good. Walking a little more. Unsure if she is still bleeding, states no one has mentioned if her stool still has blood with it. Slightly anxious about colonoscopy but otherwise OK.   BRBPR with fall in hemoglobin and 1 unit PRBCs on 2/26. Hgb 8.4 -> 8.1 -> .7.7  Out ~38 L overall. Down 64 lbs from admit. Creatinine up but stable on po torsemide. CVP remains elevated 15  Objective:   Temp:  [97.8 F (36.6 C)-98.3 F (36.8 C)] 98 F (36.7 C) (03/01 0732) Pulse Rate:  [66-116] 66 (03/01 0732) Resp:  [18] 18 (03/01 0732) BP: (101-130)/(47-86) 106/48 mmHg (03/01 0732) SpO2:  [91 %-100 %] 91 % (03/01 0732) Weight:  [359 lb 1.6 oz (162.887 kg)] 359 lb 1.6 oz (162.887 kg) (03/01 0437) Last BM Date: 06/26/15  Filed Weights   06/25/15 0632 06/26/15 0700 06/27/15 0437  Weight: 365 lb 12.8 oz (165.926 kg) 360 lb 3.2 oz (163.386 kg) 359 lb 1.6 oz (162.887 kg)    Intake/Output Summary (Last 24 hours) at 06/27/15 0814 Last data filed at 06/27/15 0733  Gross per 24 hour  Intake    960 ml  Output   2025 ml  Net  -1065 ml    Telemetry: Reviewed personally, V-paced 60-70s  Exam: CVP: 15 General:Morbidly Obese.  Pleasant, NAD. No resp difficulty Psych: Normal affect. HEENT: Normalocephalic/atraumatic, without mass or lesion. Neck: Thick, JVP remains elevated to jaw. Carotids 2+. No thyromegaly or nodule noted.  Heart: PMI nondisplaced. RRR. 2/6 SEM RUSB with clear S2, distant heart sounds Lungs: Clear, no resp distress Abdomen: Morbidly obese, non-tender, non-distended, no HSM. No bruits or masses. +BS  Extremities: No clubbing, cyanosis. DP/PT/Radials 2+ and equal bilaterally. 1-2+ edema into thighs.  Neuro: Alert and oriented X 3. Moves all extremities spontaneously.   Basic Metabolic Panel:  Recent Labs Lab 06/21/15 0450  06/23/15 2008  06/25/15 0455  06/26/15 0502 06/27/15 0503  NA 141  < >  --   < > 142 140 139  K 4.1  < >  --   < > 3.9 3.8 3.9  CL 92*  < >  --   < > 96* 95* 95*  CO2 35*  < >  --   < > 33* 32 32  GLUCOSE 93  < >  --   < > 103* 111* 97  BUN 54*  < >  --   < > 68* 71* 68*  CREATININE 1.92*  < >  --   < > 2.22* 2.41* 2.41*  CALCIUM 9.5  < >  --   < > 9.3 9.5 9.3  MG 1.9  --  1.7  --   --  1.8  --   < > = values in this interval not displayed.  Liver Function Tests: No results for input(s): AST, ALT, ALKPHOS, BILITOT, PROT, ALBUMIN in the last 168 hours.  CBC:  Recent Labs Lab 06/25/15 0455 06/26/15 0502 06/27/15 0503  WBC 6.9 8.4 7.5  HGB 8.4* 8.1* 7.7*  HCT 27.8* 26.3* 24.8*  MCV 92.4 92.3 93.2  PLT 131* 151 147*    Cardiac Enzymes: No results for input(s): CKTOTAL, CKMB, CKMBINDEX, TROPONINI in the last 168 hours.  BNP: No results for input(s): PROBNP in the last 8760 hours.  Coagulation: No results for input(s): INR in the last 168 hours.  ECG:   Medications:   Scheduled Medications: . allopurinol  100 mg Oral Daily  . antiseptic oral rinse  7 mL Mouth Rinse BID  . atorvastatin  20 mg Oral Daily  . budesonide-formoterol  2 puff Inhalation BID  . cholecalciferol  5,000 Units Oral Daily  . insulin aspart  0-9 Units Subcutaneous TID WC  . loratadine  10 mg Oral Daily  . metoprolol succinate  25 mg Oral Daily  . multivitamin with minerals  1 tablet Oral Daily  . potassium chloride  20 mEq Oral BID  . saccharomyces boulardii  250 mg Oral BID  . sodium chloride flush  10-40 mL Intracatheter Q12H  . torsemide  40 mg Oral Daily  . cyanocobalamin  500 mcg Oral Daily  . vitamin C  500 mg Oral Daily    Infusions:    PRN Medications: acetaminophen **OR** acetaminophen, albuterol, benzonatate, ondansetron **OR** ondansetron (ZOFRAN) IV, sodium chloride flush     Assessment/Plan   Markayla Divin is a 65 y.o. female with history of diastolic CHF LVEF 99991111 in [place] 02/2015, COPD, and  Asthma who presented to [facility] 06/12/15 with worsening SOB for several day  1. Acute on chronic systolic CHF: Echo 99991111 shows EF 40-45%, down from 50-55% reportedly in 02/2015.  With no ACS symptoms, would hold off on ischemic work up with her CKD.  - She remains volume overloaded with edema into thighs.  CVP 15. Unable to push diuresis more with rise in creatinine - Continue 40 mg torsemide daily. May be able to increase again once Cr normalizes.  - K stable. Cr up slightly 1.89 -> 2.15 -> 2.19 -> 2.22 -> 2.41 -> 2.41 - UNNA boots placed 06/19/15. - Losartan on hold with elevated creatinine - Continue Toprol XL at low dose.  2. S/p Medtronic PPM: Interrogated by Medtronic, single chamber pacer, pacing 85% of the time. Battery and leads WNLs. - Chronic RV pacing may be contributing to cardiomyopathy, will need EP followup, consider LV lead though may be difficult with body habitus.  3. Chronic Afib: Continue Toprol XL and Xarelto, but with fall in hgb and BRBPR will need GI evaluation. This patient's CHA2DS2-VASc Score and unadjusted Ischemic Stroke Rate (4.8 % per year)  from a score of 4.  - Xarelto held with lower GI bleeding.  4. OSA/ ? OHS component - Continue CPAP nightly, on oxygen during the day.   5. CKD stage III: Creatinine up slightly. Stopping IV diuretic.  6. Deconditioning - Working with PT. To SNF at discharge.  7. Anemia: Required 1 UPRBC 06/24/15.  BRBPR first reported 2/27. Chronically black stools on iron supplement. FOBT+.   - GI plans inpatient colonoscopy. Want to hold Xarelto x 3 days. Held last night. Scheduled for Thursday vs Friday.  - Hgb trending down again. May need another unit today. Per primary.  8. Murmur: Mild AS, moderate AI on echo.   Satira Mccallum Tillery PA-C 8:14 AM   Advanced Heart Failure Team Pager [phone] (M-F; 7a - 4p)  Please contact [facility] for night-coverage after hours (4p -7a ) and weekends on [email or web address]  Patient seen with PA,  agree with the above note.  Creatinine stable, CVP up.  Will leave on torsemide 40 mg daily, hopefully will elicit gentle gradual diuresis.   Plan for colonoscopy later this week.  Ok to transfuse hgb < 7.    [name] 06/27/2015 1:42 PM

## 2015-06-27 NOTE — Progress Notes (Signed)
[facility] PROGRESS NOTE Interim History: Reel is a 65 y.o. female with history of diastolic CHF last EF measured in November 2016 was 50-55% presents to the ER because of increasing shortness of breath and increased weight. Patient states that over the last 1 month patient has been noticing increasing shortness of breath both on sitting and exertion. Denies any chest pain. Patient's Lasix was recently increased to 20 mg twice a day. Patient states she has been to the ER at least twice over the last 1 month.  Filed Weights   06/25/15 0632 06/26/15 0700 06/27/15 0437  Weight: 165.926 kg (365 lb 12.8 oz) 163.386 kg (360 lb 3.2 oz) 162.887 kg (359 lb 1.6 oz)        Intake/Output Summary (Last 24 hours) at 06/27/15 [id number] Last data filed at 06/27/15 [id number]  Gross per 24 hour  Intake    960 ml  Output   2025 ml  Net  -1065 ml     Assessment/Plan: Acute systolic congestive heart failure, NYHA class 1 (HCC) Last EF 45%, lasix recently increase. HF team on board pat is diuresing well. On no ACE-i due to worsening creatinine which is probably due to diuresis, cont Toprolo XL. Cards rec changing IV diuretics to tosemide. She has lost over 27 kg. Cr has remained stable compared to 2.28.2017.  S/p Medtronic PPM:  Interrogated by Medtronic, single chamber pacer, pacing 85% of the time. Battery and leads WNLs. Chronic RV pacing may be contributing to cardiomyopathy, will need EP followup  Chronic A.fib: HR controlled, cont Toprol XL. CHA2DS2-VASc Score 4, will hold Xarelto for gi bleed.  Acute lower GI bleed/Normocytic anemia; Received 1 unit of PRBC due to drop in Hbg. GI consulted rec colonoscopy on 3.2.2017  Acute bronchitis: Cont symbicort.  NSVT: Cont to monitor Lytes.  Proteus mirabilis UTI Completed ceftriaxone  Morbid obesity (HCC) counseling   NSVT (nonsustained ventricular tachycardia) (HCC)  CKD (chronic kidney disease) Unknown baseline ? 1.9-2.5. This  seems to be her knew baseline. Con to monitor.  OSA: Cont c-pap.  Decondition: SNF when stable. PT cont to follow.   Code Status: full Family Communication: none  Disposition Plan: home in 2-3days   Consultants:  GI  [facility]  Procedures: ECHO: EF 45%  Antibiotics:  Complete rocephin course.  HPI/Subjective: Feels great no complains  Objective: Filed Vitals:   06/26/15 2011 06/26/15 2036 06/27/15 0437 06/27/15 0732  BP: 111/86  130/59 106/48  Pulse: 77  74 66  Temp: 98.1 F (36.7 C)  98.2 F (36.8 C) 98 F (36.7 C)  TempSrc: Oral  Oral Oral  Resp:    18  Height:      Weight:   162.887 kg (359 lb 1.6 oz)   SpO2: 96% 100% 97% 91%     Exam:  General: Alert, awake, oriented x3, in no acute distress.  HEENT: No bruits, no goiter.  Heart: Regular rate and rhythm, without murmurs. Lungs: Good air movement and clear Abdomen: Soft, nontender, nondistended, positive bowel sounds.  Neuro: Grossly intact, nonfocal.   Data Reviewed: Basic Metabolic Panel:  Recent Labs Lab 06/21/15 0450  06/23/15 0445 06/23/15 2008 06/24/15 0528 06/25/15 0455 06/26/15 0502 06/27/15 0503  NA 141  < > 139  --  139 142 140 139  K 4.1  < > 3.8  --  3.7 3.9 3.8 3.9  CL 92*  < > 95*  --  94* 96* 95* 95*  CO2 35*  < > 33*  --  33* 33* 32 32  GLUCOSE 93  < > 93  --  93 103* 111* 97  BUN 54*  < > 60*  --  62* 68* 71* 68*  CREATININE 1.92*  < > 2.15*  --  2.19* 2.22* 2.41* 2.41*  CALCIUM 9.5  < > 9.2  --  9.2 9.3 9.5 9.3  MG 1.9  --   --  1.7  --   --  1.8  --   < > = values in this interval not displayed. Liver Function Tests: No results for input(s): AST, ALT, ALKPHOS, BILITOT, PROT, ALBUMIN in the last 168 hours. No results for input(s): LIPASE, AMYLASE in the last 168 hours. No results for input(s): AMMONIA in the last 168 hours. CBC:  Recent Labs Lab 06/23/15 0445 06/24/15 0528 06/25/15 0455 06/26/15 0502 06/27/15 0503  WBC 5.4 5.8 6.9 8.4 7.5  HGB 7.8* 7.6*  8.4* 8.1* 7.7*  HCT 26.3* 23.6* 27.8* 26.3* 24.8*  MCV 94.9 93.3 92.4 92.3 93.2  PLT 139* 125* 131* 151 147*   Cardiac Enzymes: No results for input(s): CKTOTAL, CKMB, CKMBINDEX, TROPONINI in the last 168 hours. BNP (last 3 results)  Recent Labs  06/12/15 2328  BNP 940.9*    ProBNP (last 3 results) No results for input(s): PROBNP in the last 8760 hours.  CBG:  Recent Labs Lab 06/26/15 0805 06/26/15 1122 06/26/15 1612 06/26/15 1938 06/27/15 0730  GLUCAP 184* 100* 98 124* 100*    No results found for this or any previous visit (from the past 240 hour(s)).   Studies: No results found.  Scheduled Meds: . allopurinol  100 mg Oral Daily  . antiseptic oral rinse  7 mL Mouth Rinse BID  . atorvastatin  20 mg Oral Daily  . budesonide-formoterol  2 puff Inhalation BID  . cholecalciferol  5,000 Units Oral Daily  . insulin aspart  0-9 Units Subcutaneous TID WC  . loratadine  10 mg Oral Daily  . metoprolol succinate  25 mg Oral Daily  . multivitamin with minerals  1 tablet Oral Daily  . potassium chloride  20 mEq Oral BID  . saccharomyces boulardii  250 mg Oral BID  . sodium chloride flush  10-40 mL Intracatheter Q12H  . torsemide  40 mg Oral Daily  . cyanocobalamin  500 mcg Oral Daily  . vitamin C  500 mg Oral Daily   Continuous Infusions:    Charlynne Cousins  [facility] Pager [phone], If 7PM-7AM, please contact night-coverage at [email or web address], password [facility] 06/27/2015, 8:23 AM  LOS: 14 days

## 2015-06-27 NOTE — Clinical Social Work Note (Signed)
CSW provided update to [facility]. CSW continuing to follow and assist with disposition/discharge planning.  Nonnie Done, LCSW [phone]  [id number]; 2S 15-16 and [place] Licensed Clinical Social Worker

## 2015-06-27 NOTE — Progress Notes (Signed)
Physical Therapy Treatment Patient Details Name: Robyn Porter MRN: [id number] DOB: [birth year] Today's Date: 06/27/2015    History of Present Illness Robyn Porter is a 65 y.o. female with history of diastolic CHF last EF measured in November 2016 was 50-55% presents to the ER because of increasing shortness of breath and increased weight. Patient states that over the last 1 month patient has been noticing increasing shortness of breath both on sitting and exertion. Denies any chest pain    PT Comments    Pt performed increased activity performing STS x 3 with min guard assist and 20 ft and 30 ft with bariatric RW and min assist.  Pt required cues for pursed lip breathing on RA as O2 sats decreased to 84%-89% and with seated break and pursed lip breathing O2 sats improved to greater than 90%.  RN informed of saturations post tx.    Follow Up Recommendations  SNF;Supervision - Intermittent     Equipment Recommendations  None recommended by PT    Recommendations for Other Services       Precautions / Restrictions Precautions Precautions: Fall;ICD/Pacemaker Restrictions Weight Bearing Restrictions: No    Mobility  Bed Mobility Overal bed mobility: Modified Independent       Supine to sit: Modified independent (Device/Increase time)     General bed mobility comments: Head of bed slightly elevated.    Transfers Overall transfer level: Needs assistance Equipment used: Rolling walker (2 wheeled) (bariatric.  ) Transfers: Sit to/from Stand Sit to Stand: Min guard Stand pivot transfers: Min guard       General transfer comment: Pt remains to use rocking momentum to ascend from bed, BSC , and recliner.  Pt remains to hold her breath during tx requiring cues for pursed lip breathing.  Pt performed with increased time.    Ambulation/Gait Ambulation/Gait assistance: Min guard Ambulation Distance (Feet): 20 Feet (+ 30 ft.  ) Assistive device: Rolling walker (2  wheeled) Gait Pattern/deviations: Step-through pattern;Decreased step length - left;Decreased step length - right;Wide base of support;Trunk flexed     General Gait Details: Pt remains to require chair follow, cues for erect stance and progression of steps forward to advance gait distance.     Stairs            Wheelchair Mobility    Modified Rankin (Stroke Patients Only)       Balance     Sitting balance-Leahy Scale: Good       Standing balance-Leahy Scale: Fair                      Cognition Arousal/Alertness: Awake/alert Behavior During Therapy: WFL for tasks assessed/performed Overall Cognitive Status: Within Functional Limits for tasks assessed                      Exercises      General Comments        Pertinent Vitals/Pain Pain Assessment: No/denies pain    Home Living                      Prior Function            PT Goals (current goals can now be found in the care plan section) Acute Rehab PT Goals Patient Stated Goal: get better to go home soon Potential to Achieve Goals: Good Progress towards PT goals: Progressing toward goals    Frequency  Min 2X/week    PT Plan Current plan  remains appropriate    Co-evaluation PT/OT/SLP Co-Evaluation/Treatment: Yes Reason for Co-Treatment: For patient/therapist safety PT goals addressed during session: Mobility/safety with mobility OT goals addressed during session: ADL's and self-care     End of Session Equipment Utilized During Treatment: Gait belt Activity Tolerance: Patient limited by fatigue Patient left: in chair;with call bell/phone within reach     Time: 0842-0916 PT Time Calculation (min) (ACUTE ONLY): 34 min  Charges:  $Gait Training: 8-22 mins                    G Codes:      Robyn Porter 06-29-2015, 9:31 AM  Robyn Porter, PTA pager [phone]

## 2015-06-27 NOTE — Progress Notes (Signed)
Daily Rounding Note  06/27/2015, 10:00 AM  LOS: 14 days    SUBJECTIVE:       2 brown stools yesterday.  Some tightness in chest last night that felt likd SOB, relieved after nebulizer treatment.  Started clears this AM  OBJECTIVE:         Vital signs in last 24 hours:    Temp:  [98 F (36.7 C)-98.3 F (36.8 C)] 98 F (36.7 C) (03/01 0732) Pulse Rate:  [66-81] 81 (03/01 0922) Resp:  [18] 18 (03/01 0732) BP: (101-130)/(47-86) 106/48 mmHg (03/01 0732) SpO2:  [84 %-100 %] 84 % (03/01 0922) Weight:  [162.887 kg (359 lb 1.6 oz)] 162.887 kg (359 lb 1.6 oz) (03/01 0437) Last BM Date: 06/26/15 Filed Weights   06/25/15 0632 06/26/15 0700 06/27/15 0437  Weight: 165.926 kg (365 lb 12.8 oz) 163.386 kg (360 lb 3.2 oz) 162.887 kg (359 lb 1.6 oz)   General: pleasant, comfortable, obese   Heart: RRR Chest: clear bil.  Some dyspnea with speech. Abdomen: obese, soft, active BS.  NT  Extremities: massive LE edema Neuro/Psych:  Pleasant, oriented x 3.  No gross deficits.  Calm.   Intake/Output from previous day: 02/28 0701 - 03/01 0700 In: 960 [P.O.:960] Out: 1825 [Urine:1825]  Intake/Output this shift: Total I/O In: -  Out: 200 [Urine:200]  Lab Results:  Recent Labs  06/25/15 0455 06/26/15 0502 06/27/15 0503  WBC 6.9 8.4 7.5  HGB 8.4* 8.1* 7.7*  HCT 27.8* 26.3* 24.8*  PLT 131* 151 147*   BMET  Recent Labs  06/25/15 0455 06/26/15 0502 06/27/15 0503  NA 142 140 139  K 3.9 3.8 3.9  CL 96* 95* 95*  CO2 33* 32 32  GLUCOSE 103* 111* 97  BUN 68* 71* 68*  CREATININE 2.22* 2.41* 2.41*  CALCIUM 9.3 9.5 9.3   LFT No results for input(s): PROT, ALBUMIN, AST, ALT, ALKPHOS, BILITOT, BILIDIR, IBILI in the last 72 hours. PT/INR No results for input(s): LABPROT, INR in the last 72 hours. Hepatitis Panel No results for input(s): HEPBSAG, HCVAB, HEPAIGM, HEPBIGM in the last 72 hours.  Studies/Results: No results  found.  ASSESMENT:   * Hematochezia in pt on chronic Xarelto. Hemorrhoids visible on exam, may be the source. no recurrent bleeding. Overdue for colonoscopic screening and + family hx colon cancer (brother in his 52s)  * A fib, chronic Xarelto. Not on hold.   * Long standing hx of anemia. MCV in 90s. Dosing of po iron increased 04/2014 and yet again during current admission. Iron, Ferritin, TIBC, Iron sats, B12 and Folate ok.   * C diff +, Treated and resolved diarrhea 04/2014.   * Diastolic heart failure with acute volume overload, 60# diuresis thus far.   * Thrombocytopenia.   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   * Proteus UTI. Treated with Rocephin x 5 days.   * Morbid Obesity     PLAN   *  Colonoscopy set for Friday with monitored anesthesia care.  Cardiology has cleared pt for sedation for the procedure.  Split dose movi prep starts tonight.     Robyn Porter  06/27/2015, 10:00 AM Pager: [phone]  GI ATTENDING  Interval history data reviewed. Patient seen and examined. Clinically stable. Xarelto on hold. Okay from cardiology standpoint to proceed with colonoscopy to evaluate rectal bleeding and patient requiring chronic anticoagulation. She is very high risk. Examination will be set up for this Friday in order  to have monitored anesthesia care for her sedation.The nature of the procedure, as well as the risks, benefits, and alternatives were carefully and thoroughly reviewed with the patient. Ample time for discussion and questions allowed. The patient understood, was satisfied, and agreed to proceed.  Docia Chuck. Geri Seminole., M.D. [facility] Division of Gastroenterology

## 2015-06-27 NOTE — Progress Notes (Signed)
Occupational Therapy Treatment Patient Details Name: Robyn Porter MRN: [id number] DOB: Dec 21, 1950 Today's Date: 06/27/2015    History of present illness Quaniqua Chaffee is a 65 y.o. female with history of diastolic CHF last EF measured in November 2016 was 50-55% presents to the ER because of increasing shortness of breath and increased weight. Patient states that over the last 1 month patient has been noticing increasing shortness of breath both on sitting and exertion. Denies any chest pain   OT comments  Pt tolerating toileting, ambulation and then seated grooming. Cues for breathing techniques and pacing with pt on room air. Requiring seated rest breaks to recover with sats down to 84% with ambulation. Recovered to 90% within 2-3 minutes.  Pt is knowledgeable in use of AE for LB ADL and pericare and has this equipment at home.  Follow Up Recommendations  SNF;Supervision/Assistance - 24 hour    Equipment Recommendations       Recommendations for Other Services      Precautions / Restrictions Precautions Precautions: Fall;ICD/Pacemaker Restrictions Weight Bearing Restrictions: No       Mobility Bed Mobility Overal bed mobility: Modified Independent       Supine to sit: Modified independent (Device/Increase time)     General bed mobility comments: Head of bed slightly elevated.    Transfers Overall transfer level: Needs assistance Equipment used: Rolling walker (2 wheeled) Transfers: Sit to/from Stand Sit to Stand: Min guard Stand pivot transfers: Min guard       General transfer comment: Pt remains to use rocking momentum to ascend from bed, BSC , and recliner.  Pt remains to hold her breath during tx requiring cues for pursed lip breathing.  Pt performed with increased time.      Balance     Sitting balance-Leahy Scale: Good       Standing balance-Leahy Scale: Fair                     ADL Overall ADL's : Needs assistance/impaired      Grooming: Wash/dry hands;Wash/dry face;Oral care;Sitting;Set up               Lower Body Dressing: Total assistance;Sit to/from stand Lower Body Dressing Details (indicate cue type and reason): Pt uses adaptive equipment at home for LB dressing. Toilet Transfer: Min Warehouse manager and Hygiene: Total assistance;Sit to/from stand Toileting - Clothing Manipulation Details (indicate cue type and reason): pt has a toilet aide at home     Functional mobility during ADLs: Min guard;Rolling walker (chair following) General ADL Comments: Pt educated in breathing techniques as she ambulated.      Vision                     Perception     Praxis      Cognition   Behavior During Therapy: WFL for tasks assessed/performed Overall Cognitive Status: Within Functional Limits for tasks assessed                       Extremity/Trunk Assessment               Exercises     Shoulder Instructions       General Comments      Pertinent Vitals/ Pain       Pain Assessment: No/denies pain  Home Living  Prior Functioning/Environment              Frequency Min 2X/week     Progress Toward Goals  OT Goals(current goals can now be found in the care plan section)  Progress towards OT goals: Progressing toward goals  Acute Rehab OT Goals Patient Stated Goal: get better to go home soon  Plan Discharge plan remains appropriate    Co-evaluation    PT/OT/SLP Co-Evaluation/Treatment: Yes Reason for Co-Treatment: For patient/therapist safety PT goals addressed during session: Mobility/safety with mobility OT goals addressed during session: ADL's and self-care      End of Session Equipment Utilized During Treatment: Rolling walker   Activity Tolerance Patient tolerated treatment well   Patient Left in chair;with call bell/phone within reach    Nurse Communication Other (comment) (disconnected pt from monitor)        Time: [id number] OT Time Calculation (min): 37 min  Charges: OT General Charges $OT Visit: 1 Procedure OT Treatments $Self Care/Home Management : 8-22 mins  Malka So 06/27/2015, 10:27 AM [phone]

## 2015-06-28 LAB — BASIC METABOLIC PANEL
ANION GAP: 10 (ref 5–15)
BUN: 61 mg/dL — AB (ref 6–20)
CO2: 31 mmol/L (ref 22–32)
Calcium: 9.3 mg/dL (ref 8.9–10.3)
Chloride: 98 mmol/L — ABNORMAL LOW (ref 101–111)
Creatinine, Ser: 2.39 mg/dL — ABNORMAL HIGH (ref 0.44–1.00)
GFR, EST AFRICAN AMERICAN: 23 mL/min — AB (ref >60)
GFR, EST NON AFRICAN AMERICAN: 20 mL/min — AB (ref >60)
Glucose, Bld: 100 mg/dL — ABNORMAL HIGH (ref 65–99)
POTASSIUM: 3.7 mmol/L (ref 3.5–5.1)
SODIUM: 139 mmol/L (ref 135–145)

## 2015-06-28 LAB — CBC
HCT: 26 % — ABNORMAL LOW (ref 36.0–46.0)
HEMOGLOBIN: 7.9 g/dL — AB (ref 12.0–15.0)
MCH: 28.3 pg (ref 26.0–34.0)
MCHC: 30.4 g/dL (ref 30.0–36.0)
MCV: 93.2 fL (ref 78.0–100.0)
Platelets: 146 10*3/uL — ABNORMAL LOW (ref 150–400)
RBC: 2.79 MIL/uL — AB (ref 3.87–5.11)
RDW: 17.8 % — ABNORMAL HIGH (ref 11.5–15.5)
WBC: 7.2 10*3/uL (ref 4.0–10.5)

## 2015-06-28 LAB — GLUCOSE, CAPILLARY
GLUCOSE-CAPILLARY: 93 mg/dL (ref 65–99)
Glucose-Capillary: 96 mg/dL (ref 65–99)
Glucose-Capillary: 94 mg/dL (ref 65–99)

## 2015-06-28 MED ORDER — PEG-KCL-NACL-NASULF-NA ASC-C 100 G PO SOLR
0.5000 | Freq: Once | ORAL | Status: AC
  Administered 2015-06-29: 100 g via ORAL

## 2015-06-28 MED ORDER — PEG-KCL-NACL-NASULF-NA ASC-C 100 G PO SOLR
0.5000 | Freq: Once | ORAL | Status: AC
  Administered 2015-06-28: 100 g via ORAL
  Filled 2015-06-28: qty 1

## 2015-06-28 NOTE — Progress Notes (Signed)
[facility] PROGRESS NOTE Interim History: Robyn Porter is a 65 y.o. female with history of diastolic CHF last EF measured in November 2016 was 50-55% presents to the ER because of increasing shortness of breath and increased weight. Patient states that over the last 1 month patient has been noticing increasing shortness of breath both on sitting and exertion. Denies any chest pain. Patient's Lasix was recently increased to 20 mg twice a day. Patient states she has been to the ER at least twice over the last 1 month.  Filed Weights   06/26/15 0700 06/27/15 0437 06/28/15 0634  Weight: 163.386 kg (360 lb 3.2 oz) 162.887 kg (359 lb 1.6 oz) 162.025 kg (357 lb 3.2 oz)        Intake/Output Summary (Last 24 hours) at 06/28/15 0959 Last data filed at 06/28/15 0851  Gross per 24 hour  Intake   1658 ml  Output   1650 ml  Net      8 ml     Assessment/Plan: Acute systolic congestive heart failure, NYHA class 1 (HCC) Last EF 45%, now on oral torsemide. HF team on board pat is diuresing well. On no ACE-i due to worsening creatinine which is probably due to diuresis, cont Toprolo XL.  She has lost over 27 kg. Cr has remained stable compared to 2.28.2017.  S/p Medtronic PPM:  Interrogated by [employer], single chamber pacer, pacing 85% of the time. Battery and leads WNLs. Chronic RV pacing may be contributing to cardiomyopathy, will need EP followup  Chronic A.fib: HR controlled, cont Toprol XL. CHA2DS2-VASc Score 4, will hold Xarelto for gi bleed.  Acute lower GI bleed/Normocytic anemia; Received 1 unit of PRBC due to drop in Hbg. GI consulted GI deferred a colonoscopy for 06/29/2015.  NSVT: Cont to monitor Lytes.  Proteus mirabilis UTI Completed ceftriaxone  Morbid obesity (HCC) counseling  CKD (chronic kidney disease) Unknown baseline ? 1.9-2.5. This seems to be her knew baseline. Con to monitor.  OSA: Cont c-pap.  Decondition: SNF when stable. PT cont to  follow.   Code Status: full Family Communication: none  Disposition Plan: home in 2 days   Consultants:  GI  CArds  Procedures: ECHO: EF 45%  Antibiotics:  Complete rocephin course.  HPI/Subjective: Feels great no complains, wants to go home.  Objective: Filed Vitals:   06/27/15 2125 06/28/15 0634 06/28/15 0836 06/28/15 0908  BP: 116/38 125/46  112/36  Pulse: 77 60  63  Temp: 98.9 F (37.2 C) 98.3 F (36.8 C)  98.3 F (36.8 C)  TempSrc: Oral Oral  Oral  Resp: 18 92  16  Height:      Weight:  162.025 kg (357 lb 3.2 oz)    SpO2: 96% 95% 93% 97%     Exam:  General: Alert, awake, oriented x3, in no acute distress.  HEENT: No bruits, no goiter.  Heart: Regular rate and rhythm, without murmurs. Lungs: Good air movement and clear Abdomen: Soft, nontender, nondistended, positive bowel sounds.  Neuro: Grossly intact, nonfocal.   Data Reviewed: Basic Metabolic Panel:  Recent Labs Lab 06/23/15 2008 06/24/15 0528 06/25/15 0455 06/26/15 0502 06/27/15 0503 06/28/15 0530  NA  --  139 142 140 139 139  K  --  3.7 3.9 3.8 3.9 3.7  CL  --  94* 96* 95* 95* 98*  CO2  --  33* 33* 32 32 31  GLUCOSE  --  93 103* 111* 97 100*  BUN  --  62* 68* 71* 68* 61*  CREATININE  --  2.19* 2.22* 2.41* 2.41* 2.39*  CALCIUM  --  9.2 9.3 9.5 9.3 9.3  MG 1.7  --   --  1.8  --   --    Liver Function Tests: No results for input(s): AST, ALT, ALKPHOS, BILITOT, PROT, ALBUMIN in the last 168 hours. No results for input(s): LIPASE, AMYLASE in the last 168 hours. No results for input(s): AMMONIA in the last 168 hours. CBC:  Recent Labs Lab 06/23/15 0445 06/24/15 0528 06/25/15 0455 06/26/15 0502 06/27/15 0503  WBC 5.4 5.8 6.9 8.4 7.5  HGB 7.8* 7.6* 8.4* 8.1* 7.7*  HCT 26.3* 23.6* 27.8* 26.3* 24.8*  MCV 94.9 93.3 92.4 92.3 93.2  PLT 139* 125* 131* 151 147*   Cardiac Enzymes: No results for input(s): CKTOTAL, CKMB, CKMBINDEX, TROPONINI in the last 168 hours. BNP (last 3  results)  Recent Labs  06/12/15 2328  BNP 940.9*    ProBNP (last 3 results) No results for input(s): PROBNP in the last 8760 hours.  CBG:  Recent Labs Lab 06/27/15 0730 06/27/15 1148 06/27/15 1703 06/27/15 2118 06/28/15 0724  GLUCAP 100* 106* 85 85 96    No results found for this or any previous visit (from the past 240 hour(s)).   Studies: No results found.  Scheduled Meds: . allopurinol  100 mg Oral Daily  . antiseptic oral rinse  7 mL Mouth Rinse BID  . atorvastatin  20 mg Oral Daily  . bisacodyl  10 mg Oral BID  . budesonide-formoterol  2 puff Inhalation BID  . cholecalciferol  5,000 Units Oral Daily  . insulin aspart  0-9 Units Subcutaneous TID WC  . loratadine  10 mg Oral Daily  . metoprolol succinate  25 mg Oral Daily  . multivitamin with minerals  1 tablet Oral Daily  . peg 3350 powder  0.5 kit Oral Once   And  . [START ON 06/29/2015] peg 3350 powder  0.5 kit Oral Once  . potassium chloride  20 mEq Oral BID  . saccharomyces boulardii  250 mg Oral BID  . sodium chloride flush  10-40 mL Intracatheter Q12H  . torsemide  40 mg Oral Daily  . cyanocobalamin  500 mcg Oral Daily  . vitamin C  500 mg Oral Daily   Continuous Infusions:    Charlynne Cousins  [facility] Pager [phone], If 7PM-7AM, please contact night-coverage at [email or web address], password [facility] 06/28/2015, 9:59 AM  LOS: 15 days

## 2015-06-28 NOTE — Progress Notes (Signed)
        Daily Rounding Note  06/28/2015, 8:59 AM  LOS: 15 days   SUBJECTIVE:       2 BMs yesterday, 1 this AM.  All brown and without blood.  OBJECTIVE:         Vital signs in last 24 hours:    Temp:  [98 F (36.7 C)-98.9 F (37.2 C)] 98.3 F (36.8 C) (03/02 0634) Pulse Rate:  [60-89] 60 (03/02 0634) Resp:  [16-92] 92 (03/02 0634) BP: (104-125)/(38-79) 125/46 mmHg (03/02 0634) SpO2:  [84 %-96 %] 95 % (03/02 0634) Weight:  [162.025 kg (357 lb 3.2 oz)] 162.025 kg (357 lb 3.2 oz) (03/02 0634) Last BM Date: 06/27/15 Filed Weights   06/26/15 0700 06/27/15 0437 06/28/15 0634  Weight: 163.386 kg (360 lb 3.2 oz) 162.887 kg (359 lb 1.6 oz) 162.025 kg (357 lb 3.2 oz)   General: looks the same, obese/chronically ill.  Comfortable and NAD   Heart: RRR Chest: clear bil.  Dry/"ticklish" cough.  Mild dyspnea with speach Abdomen: obese, active BS, NT  Extremities: massive edema Neuro/Psych:  Oriented x 3.  Fully alert.  No gross deficits.   Intake/Output from previous day: 03/01 0701 - 03/02 0700 In: 1778 [P.O.:1778] Out: 1850 [Urine:1850]  Intake/Output this shift: Total I/O In: 360 [P.O.:360] Out: -   Lab Results:  Recent Labs  06/26/15 0502 06/27/15 0503  WBC 8.4 7.5  HGB 8.1* 7.7*  HCT 26.3* 24.8*  PLT 151 147*   BMET  Recent Labs  06/26/15 0502 06/27/15 0503 06/28/15 0530  NA 140 139 139  K 3.8 3.9 3.7  CL 95* 95* 98*  CO2 32 32 31  GLUCOSE 111* 97 100*  BUN 71* 68* 61*  CREATININE 2.41* 2.41* 2.39*  CALCIUM 9.5 9.3 9.3   LFT No results for input(s): PROT, ALBUMIN, AST, ALT, ALKPHOS, BILITOT, BILIDIR, IBILI in the last 72 hours. PT/INR No results for input(s): LABPROT, INR in the last 72 hours. Hepatitis Panel No results for input(s): HEPBSAG, HCVAB, HEPAIGM, HEPBIGM in the last 72 hours.  Studies/Results: No results found.  Scheduled Meds: . allopurinol  100 mg Oral Daily  . antiseptic oral  rinse  7 mL Mouth Rinse BID  . atorvastatin  20 mg Oral Daily  . bisacodyl  10 mg Oral BID  . budesonide-formoterol  2 puff Inhalation BID  . cholecalciferol  5,000 Units Oral Daily  . insulin aspart  0-9 Units Subcutaneous TID WC  . loratadine  10 mg Oral Daily  . metoprolol succinate  25 mg Oral Daily  . multivitamin with minerals  1 tablet Oral Daily  . peg 3350 powder  0.5 kit Oral Once   And  . [START ON 06/29/2015] peg 3350 powder  0.5 kit Oral Once  . potassium chloride  20 mEq Oral BID  . saccharomyces boulardii  250 mg Oral BID  . sodium chloride flush  10-40 mL Intracatheter Q12H  . torsemide  40 mg Oral Daily  . cyanocobalamin  500 mcg Oral Daily  . vitamin C  500 mg Oral Daily   Continuous Infusions:  PRN Meds:.acetaminophen **OR** acetaminophen, albuterol, benzonatate, ondansetron **OR** ondansetron (ZOFRAN) IV, sodium chloride flush  ASSESMENT:   * Hematochezia in pt on chronic Xarelto. Hemorrhoids visible on exam, may be the source. no recurrent bleeding. Overdue for colonoscopic screening and + family hx colon cancer (brother in his 70s)  * A fib, chronic Xarelto.Last dose 2/26  * Long standing hx of   anemia. MCV in 90s. Dosing of po iron increased 04/2014 and yet again during current admission. Iron, Ferritin, TIBC, Iron sats, B12 and Folate ok. PO iron on hold to optimize bowel prep.   * C diff +, Treated and resolved diarrhea 04/2014.   * Diastolic heart failure with acute volume overload, 66# diuresis thus far.   * Thrombocytopenia.   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 stable.   * Morbid Obesity.  OSA.       PLAN   *  Colonoscopy set for 10 AM tomorrow.  Stay on clears.  Split dose movi prep starts tonight.     Sarah Gribbin  06/28/2015, 8:59 AM Pager: [phone]  GI ATTENDING  Interval history data reviewed. Agree with interval progress note as outlined. Prep today for colonoscopy tomorrow. Fortunately, no further bleeding  John N. Perry, Jr.,  M.D. [facility] Division of Gastroenterology 

## 2015-06-28 NOTE — Progress Notes (Signed)
Patient has home CPAP, tubing and mask. Patient is able to place self on and off. RT told patient to call if she needed any assitance

## 2015-06-28 NOTE — Progress Notes (Signed)
Patient ID: Robyn Porter, female   DOB: [birth year], 65 y.o.   MRN: [id number]      Subjective:    Feels OK. No further bleeding noted. Denies SOB or CP. No lightheadedness or dizziness working with PT.   BRBPR with fall in hemoglobin and 1 unit PRBCs on 2/26. Hgb 8.4 -> 8.1 -> 7.7  Out ~38 L overall. Down 66 lbs from admit. Creatinine stableon po torsemide. CVP 12   Objective:   Temp:  [98 F (36.7 C)-98.9 F (37.2 C)] 98.3 F (36.8 C) (03/02 0908) Pulse Rate:  [60-89] 65 (03/02 1121) Resp:  [16-92] 16 (03/02 0908) BP: (101-125)/(33-79) 101/33 mmHg (03/02 1121) SpO2:  [93 %-97 %] 97 % (03/02 0908) Weight:  [357 lb 3.2 oz (162.025 kg)] 357 lb 3.2 oz (162.025 kg) (03/02 0634) Last BM Date: 06/27/15  Filed Weights   06/26/15 0700 06/27/15 0437 06/28/15 0634  Weight: 360 lb 3.2 oz (163.386 kg) 359 lb 1.6 oz (162.887 kg) 357 lb 3.2 oz (162.025 kg)    Intake/Output Summary (Last 24 hours) at 06/28/15 1254 Last data filed at 06/28/15 0851  Gross per 24 hour  Intake   1298 ml  Output   1650 ml  Net   -352 ml    Telemetry: Reviewed personally, V-paced   Exam: CVP: 12 General:Morbidly Obese.  Pleasant, NAD. No resp difficulty Psych: Normal affect. HEENT: Normal Neck: Thick, JVP remains elevated Carotids 2+. No thyromegaly or nodule noted.  Heart: PMI nondisplaced. RRR. 2/6 SEM RUSB with clear S2, distant heart sounds Lungs: CTAB, normal effort Abdomen: Morbidly obese, non-tender, non-distended, no HSM. No bruits or masses. +BS  Extremities: No clubbing, cyanosis. DP/PT/Radials 2+ and equal bilaterally. 1-+ edema into thighs.  Neuro: Alert and oriented X 3. Moves all extremities spontaneously.   Basic Metabolic Panel:  Recent Labs Lab 06/23/15 2008  06/26/15 0502 06/27/15 0503 06/28/15 0530  NA  --   < > 140 139 139  K  --   < > 3.8 3.9 3.7  CL  --   < > 95* 95* 98*  CO2  --   < > 32 32 31  GLUCOSE  --   < > 111* 97 100*  BUN  --   < > 71* 68* 61*    CREATININE  --   < > 2.41* 2.41* 2.39*  CALCIUM  --   < > 9.5 9.3 9.3  MG 1.7  --  1.8  --   --   < > = values in this interval not displayed.  Liver Function Tests: No results for input(s): AST, ALT, ALKPHOS, BILITOT, PROT, ALBUMIN in the last 168 hours.  CBC:  Recent Labs Lab 06/25/15 0455 06/26/15 0502 06/27/15 0503  WBC 6.9 8.4 7.5  HGB 8.4* 8.1* 7.7*  HCT 27.8* 26.3* 24.8*  MCV 92.4 92.3 93.2  PLT 131* 151 147*    Cardiac Enzymes: No results for input(s): CKTOTAL, CKMB, CKMBINDEX, TROPONINI in the last 168 hours.  BNP: No results for input(s): PROBNP in the last 8760 hours.  Coagulation: No results for input(s): INR in the last 168 hours.  ECG:   Medications:   Scheduled Medications: . allopurinol  100 mg Oral Daily  . antiseptic oral rinse  7 mL Mouth Rinse BID  . atorvastatin  20 mg Oral Daily  . bisacodyl  10 mg Oral BID  . budesonide-formoterol  2 puff Inhalation BID  . cholecalciferol  5,000 Units Oral Daily  . insulin aspart  0-9 Units Subcutaneous TID WC  . loratadine  10 mg Oral Daily  . metoprolol succinate  25 mg Oral Daily  . multivitamin with minerals  1 tablet Oral Daily  . peg 3350 powder  0.5 kit Oral Once   And  . [START ON 06/29/2015] peg 3350 powder  0.5 kit Oral Once  . potassium chloride  20 mEq Oral BID  . saccharomyces boulardii  250 mg Oral BID  . sodium chloride flush  10-40 mL Intracatheter Q12H  . torsemide  40 mg Oral Daily  . cyanocobalamin  500 mcg Oral Daily  . vitamin C  500 mg Oral Daily    Infusions:    PRN Medications: acetaminophen **OR** acetaminophen, albuterol, benzonatate, ondansetron **OR** ondansetron (ZOFRAN) IV, sodium chloride flush     Assessment/Plan   Robyn Porter is a 65 y.o. female with history of diastolic CHF LVEF 59-92% in [place] 02/2015, COPD, and Asthma who presented to [facility] 06/12/15 with worsening SOB for several day  1. Acute on chronic systolic CHF: Echo 3/41/44 shows EF 40-45%, down  from 50-55% reportedly in 02/2015.  With no ACS symptoms, would hold off on ischemic work up with her CKD.  - She remains volume overloaded but unable to push diuresis with ^ Cr. CVP 12.  - Continue 40 mg torsemide daily. Creatinine remains up but stable. Continues to diurese on torsemide.  - K stable. Cr up slightly 1.89 -> 2.15 -> 2.19 -> 2.22 -> 2.41 -> 2.41 -> 2.39 - UNNA boots placed 06/19/15. - Losartan on hold with elevated creatinine - Continue Toprol XL at low dose.  2. S/p Medtronic PPM: Interrogated by Medtronic, single chamber pacer, pacing 85% of the time. Battery and leads WNLs. - Chronic RV pacing may be contributing to cardiomyopathy, will need EP followup, consider LV lead though may be difficult with body habitus.  3. Chronic Afib: Continue Toprol XL and Xarelto, but with fall in hgb and BRBPR will need GI evaluation. This patient's CHA2DS2-VASc Score and unadjusted Ischemic Stroke Rate (4.8 % per year)  from a score of 4.  - Xarelto held with lower GI bleeding.  4. OSA/ ? OHS component - Continue CPAP nightly, on oxygen during the day.   5. CKD stage III: Creatinine up slightly. Stopping IV diuretic.  6. Deconditioning - Working with PT. To SNF at discharge.  7. Anemia: Required 1 UPRBC 06/24/15.  BRBPR first reported 2/27. Chronically black stools on iron supplement. FOBT+.   - CBC pending today.  - GI plans inpatient colonoscopy. Want to hold Xarelto x 3 days. Held last night. Scheduled for tomorrow.   8. Murmur: Mild AS, moderate AI on echo.   Shirley Friar PA-C 12:54 PM   Advanced Heart Failure Team Pager [phone] (M-F; 7a - 4p)  Please contact [facility] for night-coverage after hours (4p -7a ) and weekends on [email or web address]  Patient seen with PA, agree with the above note.  Stable creatinine, continue current torsemide.  Plan for c-scope tomorrow, will then need to decide on long-term anticoagulation.  Hopefully ready for SNF by weekend.   [name] 06/28/2015

## 2015-06-28 NOTE — Procedures (Signed)
CVP setup changed without complications.  

## 2015-06-29 ENCOUNTER — Inpatient Hospital Stay (HOSPITAL_COMMUNITY): Payer: BLUE CROSS/BLUE SHIELD | Admitting: Anesthesiology

## 2015-06-29 ENCOUNTER — Encounter (HOSPITAL_COMMUNITY): Payer: Self-pay | Admitting: *Deleted

## 2015-06-29 ENCOUNTER — Encounter (HOSPITAL_COMMUNITY)
Admission: EM | Disposition: A | Discharge status: Skilled Nursing Facility | Payer: Self-pay | Source: Home / Self Care | Attending: Internal Medicine

## 2015-06-29 DIAGNOSIS — K648 Other hemorrhoids: Secondary | ICD-10-CM | POA: Insufficient documentation

## 2015-06-29 DIAGNOSIS — D123 Benign neoplasm of transverse colon: Secondary | ICD-10-CM | POA: Insufficient documentation

## 2015-06-29 HISTORY — PX: COLONOSCOPY: SHX5424

## 2015-06-29 LAB — BASIC METABOLIC PANEL
Anion gap: 16 — ABNORMAL HIGH (ref 5–15)
BUN: 53 mg/dL — ABNORMAL HIGH (ref 6–20)
CALCIUM: 9.5 mg/dL (ref 8.9–10.3)
CHLORIDE: 98 mmol/L — AB (ref 101–111)
CO2: 27 mmol/L (ref 22–32)
CREATININE: 2.25 mg/dL — AB (ref 0.44–1.00)
GFR calc non Af Amer: 22 mL/min — ABNORMAL LOW (ref >60)
GFR, EST AFRICAN AMERICAN: 25 mL/min — AB (ref >60)
GLUCOSE: 98 mg/dL (ref 65–99)
Potassium: 3.6 mmol/L (ref 3.5–5.1)
Sodium: 141 mmol/L (ref 135–145)

## 2015-06-29 LAB — CBC
HCT: 25.4 % — ABNORMAL LOW (ref 36.0–46.0)
HEMOGLOBIN: 7.7 g/dL — AB (ref 12.0–15.0)
MCH: 28.2 pg (ref 26.0–34.0)
MCHC: 30.3 g/dL (ref 30.0–36.0)
MCV: 93 fL (ref 78.0–100.0)
PLATELETS: 142 10*3/uL — AB (ref 150–400)
RBC: 2.73 MIL/uL — ABNORMAL LOW (ref 3.87–5.11)
RDW: 17.8 % — ABNORMAL HIGH (ref 11.5–15.5)
WBC: 7.4 10*3/uL (ref 4.0–10.5)

## 2015-06-29 LAB — GLUCOSE, CAPILLARY
GLUCOSE-CAPILLARY: 88 mg/dL (ref 65–99)
Glucose-Capillary: 85 mg/dL (ref 65–99)
Glucose-Capillary: 94 mg/dL (ref 65–99)
Glucose-Capillary: 101 mg/dL — ABNORMAL HIGH (ref 65–99)

## 2015-06-29 SURGERY — COLONOSCOPY
Anesthesia: Monitor Anesthesia Care

## 2015-06-29 MED ORDER — MIDAZOLAM HCL 5 MG/5ML IJ SOLN
INTRAMUSCULAR | Status: DC | PRN
  Administered 2015-06-29: 1 mg via INTRAVENOUS

## 2015-06-29 MED ORDER — METOPROLOL SUCCINATE ER 25 MG PO TB24: 25.0000 mg | ORAL_TABLET | Freq: Every day | ORAL | Status: DC

## 2015-06-29 MED ORDER — TORSEMIDE 20 MG PO TABS
20.0000 mg | ORAL_TABLET | Freq: Every day | ORAL | Status: DC
  Administered 2015-06-29: 20 mg via ORAL
  Filled 2015-06-29: qty 1

## 2015-06-29 MED ORDER — TORSEMIDE 20 MG PO TABS: 20.0000 mg | ORAL_TABLET | Freq: Every day | ORAL | Status: DC

## 2015-06-29 MED ORDER — PROPOFOL 10 MG/ML IV BOLUS
INTRAVENOUS | Status: DC | PRN
  Administered 2015-06-29 (×5): 10 mg via INTRAVENOUS

## 2015-06-29 MED ORDER — SODIUM CHLORIDE 0.9 % IV SOLN: INTRAVENOUS | Status: DC

## 2015-06-29 MED ORDER — SODIUM CHLORIDE 0.9 % IV SOLN
INTRAVENOUS | Status: DC | PRN
  Administered 2015-06-29: 10:00:00 via INTRAVENOUS

## 2015-06-29 MED ORDER — POTASSIUM CHLORIDE CRYS ER 20 MEQ PO TBCR
20.0000 meq | EXTENDED_RELEASE_TABLET | Freq: Once | ORAL | Status: AC
  Administered 2015-06-29: 20 meq via ORAL

## 2015-06-29 MED ORDER — RIVAROXABAN 20 MG PO TABS
20.0000 mg | ORAL_TABLET | Freq: Every day | ORAL | Status: DC
  Administered 2015-06-29: 20 mg via ORAL
  Filled 2015-06-29: qty 1

## 2015-06-29 MED ORDER — TORSEMIDE 20 MG PO TABS: 40.0000 mg | ORAL_TABLET | Freq: Every morning | ORAL | Status: DC

## 2015-06-29 MED ORDER — FENTANYL CITRATE (PF) 100 MCG/2ML IJ SOLN
INTRAMUSCULAR | Status: DC | PRN
  Administered 2015-06-29: 50 ug via INTRAVENOUS
  Administered 2015-06-29 (×2): 25 ug via INTRAVENOUS

## 2015-06-29 MED ORDER — POTASSIUM CHLORIDE CRYS ER 20 MEQ PO TBCR: 20.0000 meq | EXTENDED_RELEASE_TABLET | Freq: Two times a day (BID) | ORAL | Status: DC

## 2015-06-29 NOTE — Progress Notes (Signed)
Physical Therapy Treatment Patient Details Name: Robyn Porter MRN: [id number] DOB: April 08, 1951 Today's Date: 06/29/2015    History of Present Illness Robyn Porter is a 65 y.o. female with history of diastolic CHF last EF measured in November 2016 was 50-55% presents to the ER because of increasing shortness of breath and increased weight. Patient states that over the last 1 month patient has been noticing increasing shortness of breath both on sitting and exertion. Denies any chest pain    PT Comments    Pt performed increased gait with cues for pursed lip breathing and rest periods to improve O2 sats when they decrease.  o2 sats ranged from 85%-94% on RA.  Pt to d/c to SNF today.    Follow Up Recommendations  SNF;Supervision - Intermittent     Equipment Recommendations  None recommended by PT    Recommendations for Other Services       Precautions / Restrictions Precautions Precautions: Fall;ICD/Pacemaker Restrictions Weight Bearing Restrictions: No    Mobility  Bed Mobility Overal bed mobility: Modified Independent Bed Mobility: Supine to Sit     Supine to sit: Modified independent (Device/Increase time)     General bed mobility comments: Head of bed slightly elevated.    Transfers Overall transfer level: Needs assistance Equipment used: Rolling walker (2 wheeled) Transfers: Sit to/from Stand Sit to Stand: Min guard;Supervision         General transfer comment: Pt remains to use rocking momentum to ascend from bed, BSC , and recliner.  Pt remains to hold her breath during tx requiring cues for pursed lip breathing.  Pt performed with increased time.    Ambulation/Gait Ambulation/Gait assistance: Min guard Ambulation Distance (Feet): 38 Feet (+48ft + 72ft + 18 ft.) Assistive device: Rolling walker (2 wheeled) (bariatric) Gait Pattern/deviations: Step-through pattern;Decreased step length - right;Decreased step length - left;Wide base of support;Trunk  flexed     General Gait Details: Pt remains to require chair follow, cues for erect stance and progression of steps forward to advance gait distance.  Cues for pursed lip breathing to maintain O2 sats during intervention.  O2 sats ranged from 85%-94%.     Stairs            Wheelchair Mobility    Modified Rankin (Stroke Patients Only)       Balance Overall balance assessment: Needs assistance   Sitting balance-Leahy Scale: Good       Standing balance-Leahy Scale: Good                      Cognition Arousal/Alertness: Awake/alert Behavior During Therapy: WFL for tasks assessed/performed Overall Cognitive Status: Within Functional Limits for tasks assessed                      Exercises      General Comments        Pertinent Vitals/Pain Pain Assessment: No/denies pain    Home Living                      Prior Function            PT Goals (current goals can now be found in the care plan section) Acute Rehab PT Goals Patient Stated Goal: get better to go home soon Potential to Achieve Goals: Good Progress towards PT goals: Progressing toward goals    Frequency  Min 2X/week    PT Plan Current plan remains appropriate    Co-evaluation  End of Session Equipment Utilized During Treatment: Gait belt Activity Tolerance: Patient limited by fatigue Patient left: in chair;with call bell/phone within reach     Time: 1327-1358 PT Time Calculation (min) (ACUTE ONLY): 31 min  Charges:  $Gait Training: 8-22 mins $Therapeutic Activity: 8-22 mins                    G Codes:      Cristela Blue July 13, 2015, 2:08 PM  Governor Rooks, PTA pager [phone]

## 2015-06-29 NOTE — H&P (View-Only) (Signed)
Daily Rounding Note  06/28/2015, 8:59 AM  LOS: 15 days   SUBJECTIVE:       2 BMs yesterday, 1 this AM.  All brown and without blood.  OBJECTIVE:         Vital signs in last 24 hours:    Temp:  [98 F (36.7 C)-98.9 F (37.2 C)] 98.3 F (36.8 C) (03/02 0634) Pulse Rate:  [60-89] 60 (03/02 0634) Resp:  [16-92] 92 (03/02 0634) BP: (104-125)/(38-79) 125/46 mmHg (03/02 0634) SpO2:  [84 %-96 %] 95 % (03/02 0634) Weight:  [162.025 kg (357 lb 3.2 oz)] 162.025 kg (357 lb 3.2 oz) (03/02 0634) Last BM Date: 06/27/15 Filed Weights   06/26/15 0700 06/27/15 0437 06/28/15 0634  Weight: 163.386 kg (360 lb 3.2 oz) 162.887 kg (359 lb 1.6 oz) 162.025 kg (357 lb 3.2 oz)   General: looks the same, obese/chronically ill.  Comfortable and NAD   Heart: RRR Chest: clear bil.  Dry/"ticklish" cough.  Mild dyspnea with speach Abdomen: obese, active BS, NT  Extremities: massive edema Neuro/Psych:  Oriented x 3.  Fully alert.  No gross deficits.   Intake/Output from previous day: 03/01 0701 - 03/02 0700 In: 1778 [P.O.:1778] Out: 1850 [Urine:1850]  Intake/Output this shift: Total I/O In: 360 [P.O.:360] Out: -   Lab Results:  Recent Labs  06/26/15 0502 06/27/15 0503  WBC 8.4 7.5  HGB 8.1* 7.7*  HCT 26.3* 24.8*  PLT 151 147*   BMET  Recent Labs  06/26/15 0502 06/27/15 0503 06/28/15 0530  NA 140 139 139  K 3.8 3.9 3.7  CL 95* 95* 98*  CO2 32 32 31  GLUCOSE 111* 97 100*  BUN 71* 68* 61*  CREATININE 2.41* 2.41* 2.39*  CALCIUM 9.5 9.3 9.3   LFT No results for input(s): PROT, ALBUMIN, AST, ALT, ALKPHOS, BILITOT, BILIDIR, IBILI in the last 72 hours. PT/INR No results for input(s): LABPROT, INR in the last 72 hours. Hepatitis Panel No results for input(s): HEPBSAG, HCVAB, HEPAIGM, HEPBIGM in the last 72 hours.  Studies/Results: No results found.  Scheduled Meds: . allopurinol  100 mg Oral Daily  . antiseptic oral  rinse  7 mL Mouth Rinse BID  . atorvastatin  20 mg Oral Daily  . bisacodyl  10 mg Oral BID  . budesonide-formoterol  2 puff Inhalation BID  . cholecalciferol  5,000 Units Oral Daily  . insulin aspart  0-9 Units Subcutaneous TID WC  . loratadine  10 mg Oral Daily  . metoprolol succinate  25 mg Oral Daily  . multivitamin with minerals  1 tablet Oral Daily  . peg 3350 powder  0.5 kit Oral Once   And  . [START ON 06/29/2015] peg 3350 powder  0.5 kit Oral Once  . potassium chloride  20 mEq Oral BID  . saccharomyces boulardii  250 mg Oral BID  . sodium chloride flush  10-40 mL Intracatheter Q12H  . torsemide  40 mg Oral Daily  . cyanocobalamin  500 mcg Oral Daily  . vitamin C  500 mg Oral Daily   Continuous Infusions:  PRN Meds:.acetaminophen **OR** acetaminophen, albuterol, benzonatate, ondansetron **OR** ondansetron (ZOFRAN) IV, sodium chloride flush  ASSESMENT:   * Hematochezia in pt on chronic Xarelto. Hemorrhoids visible on exam, may be the source. no recurrent bleeding. Overdue for colonoscopic screening and + family hx colon cancer (brother in his 49s)  * A fib, chronic Xarelto.Last dose 2/26  * Long standing hx of  anemia. MCV in 90s. Dosing of po iron increased 04/2014 and yet again during current admission. Iron, Ferritin, TIBC, Iron sats, B12 and Folate ok. PO iron on hold to optimize bowel prep.   * C diff +, Treated and resolved diarrhea 04/2014.   * Diastolic heart failure with acute volume overload, 66# diuresis thus far.   * Thrombocytopenia.   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 stable.   * Morbid Obesity.  OSA.       PLAN   *  Colonoscopy set for 10 AM tomorrow.  Stay on clears.  Split dose movi prep starts tonight.     Sarah Gribbin  06/28/2015, 8:59 AM Pager: [phone]  GI ATTENDING  Interval history data reviewed. Agree with interval progress note as outlined. Prep today for colonoscopy tomorrow. Fortunately, no further bleeding  John N. Perry, Jr.,  M.D. [facility] of Gastroenterology 

## 2015-06-29 NOTE — Anesthesia Preprocedure Evaluation (Addendum)
Anesthesia Evaluation  Patient identified by MRN, date of birth, ID band Patient awake    Reviewed: Allergy & Precautions, NPO status , Patient's Chart, lab work & pertinent test results  Airway Mallampati: II  TM Distance: >3 FB Neck ROM: Full    Dental   Pulmonary shortness of breath and with exertion, sleep apnea and Continuous Positive Airway Pressure Ventilation , COPD,    breath sounds clear to auscultation       Cardiovascular hypertension, Pt. on medications +CHF  + pacemaker  Rhythm:Regular     Neuro/Psych    GI/Hepatic   Endo/Other  diabetes, Type 2, Insulin Dependent  Renal/GU Renal InsufficiencyRenal disease     Musculoskeletal   Abdominal (+) + obese,  Abdomen: soft.    Peds  Hematology  (+) anemia ,   Anesthesia Other Findings   Reproductive/Obstetrics                            Anesthesia Physical Anesthesia Plan  ASA: IV  Anesthesia Plan: MAC   Post-op Pain Management:    Induction:   Airway Management Planned: Simple Face Mask  Additional Equipment:   Intra-op Plan:   Post-operative Plan:   Informed Consent:   Dental advisory given  Plan Discussed with: Anesthesiologist and Surgeon  Anesthesia Plan Comments:         Anesthesia Quick Evaluation

## 2015-06-29 NOTE — Discharge Instructions (Signed)

## 2015-06-29 NOTE — Interval H&P Note (Signed)
History and Physical Interval Note:  06/29/2015 10:24 AM  Robyn Porter  has presented today for surgery, with the diagnosis of anemia and passing blood per rectum  The various methods of treatment have been discussed with the patient and family. After consideration of risks, benefits and other options for treatment, the patient has consented to  Procedure(s): COLONOSCOPY (N/A) as a surgical intervention .  The patient's history has been reviewed, patient examined, no change in status, stable for surgery.  I have reviewed the patient's chart and labs.  Questions were answered to the patient's satisfaction.     Robyn Porter

## 2015-06-29 NOTE — Op Note (Signed)
[facility] [place], [address]   1COLONOSCOPY PROCEDURE REPORT  PATIENT: Robyn Porter, Robyn Porter  MR#: [id number] BIRTHDATE: 03/20/1951 , 66  yrs. old GENDER: female ENDOSCOPIST: Eustace Quail, MD REFERRED [id number] Hospitalists PROCEDURE DATE:  06/29/2015 PROCEDURE:   Colonoscopy with snare polypectomy -1  ASA CLASS:   Class IV INDICATIONS:rectal bleeding. Minor. Requires long-term anticoagulation. MEDICATIONS: Monitored anesthesia care and Per Anesthesia  DESCRIPTION OF PROCEDURE:   After the risks benefits and alternatives of the procedure were thoroughly explained, informed consent was obtained.  The digital rectal exam revealed external hemorrhoids.   The [employer] Adult Colon [id number]  endoscope was introduced through the anus and advanced to the cecum, which was identified by both the appendix and ileocecal valve. No adverse events experienced.   The quality of the prep was excellent. (MoviPrep was used)  The instrument was then slowly withdrawn as the colon was fully examined. Estimated blood loss is zero unless otherwise noted in this procedure report.    COLON FINDINGS: A single polyp measuring 2 mm in size was found in the transverse colon.  A polypectomy was performed with a cold snare.  The resection was complete, the polyp tissue was completely retrieved and sent to histology.   The examination was otherwise normal.  Retroflexed views revealed internal hemorrhoids. The time to cecum = 4.4 Withdrawal time = 11.8   The scope was withdrawn and the procedure completed. COMPLICATIONS: There were no immediate complications.  ENDOSCOPIC IMPRESSION: 1.   Single tiny benign polyp was found in the transverse colon; polypectomy was performed with a cold snare 2.   The examination was otherwise normal 3. Minor rectal bleeding secondary to hemorrhoids. This has stopped  RECOMMENDATIONS: 1.  Okay to resume anticoagulation therapy from GI  standpoint 2.  Standard hemorrhoidal medications if needed for recurrent bleeding 3.  GI will sign off. No routine future colonoscopy recommended secondary to current age and comorbidities  eSigned:  Eustace Quail, MD 06/29/2015 11:12 AM   cc: The Patient and [name], MD

## 2015-06-29 NOTE — Progress Notes (Signed)
Pt for d/c to [facility] and the facility is aware.  They are working to [employer] authorization.  All d/c paperwork has been sent to facility via [facility] and family is aware of the d/c plan.  CSW will continue to follow.  Creta Levin, LCSW Covering [phone]

## 2015-06-29 NOTE — Progress Notes (Signed)
   06/29/15 2126  BiPAP/CPAP/SIPAP  BiPAP/CPAP/SIPAP Pt Type Adult  Mask Type Nasal pillows  Mask Size Small  Respiratory Rate 18 breaths/min  BiPAP/CPAP/SIPAP CPAP  Patient Home Equipment Yes  Auto Titrate No  Assist patient with her home unit CPAP

## 2015-06-29 NOTE — Transfer of Care (Signed)
Immediate Anesthesia Transfer of Care Note  Patient: Robyn Porter  Procedure(s) Performed: Procedure(s): COLONOSCOPY (N/A)  Patient Location: Endoscopy Unit  Anesthesia Type:MAC  Level of Consciousness: awake, alert  and oriented  Airway & Oxygen Therapy: Patient Spontanous Breathing and Patient connected to nasal cannula oxygen  Post-op Assessment: Report given to RN and Post -op Vital signs reviewed and stable  Post vital signs: Reviewed and stable  Last Vitals:  Filed Vitals:   06/29/15 0931 06/29/15 1110  BP:  124/52  Pulse: 77 65  Temp: 36.7 C   Resp: 25 19    Complications: No apparent anesthesia complications

## 2015-06-29 NOTE — Progress Notes (Signed)
Patient ID: Robyn Porter, female   DOB: 1950-05-03, 65 y.o.   MRN: [id number]      Subjective:    Feels OK. Lots of stool with Movi-prep. To be picked up for colonoscopy at 0900. Feeling weak and fatigued after colon-prep. No further bleeding that she knows of.   BRBPR with fall in hemoglobin and 1 unit PRBCs on 2/26. Hgb 8.4 -> 8.1 -> 7.7 -> 7.9 -> 7.7  Out ~38 L overall. I/O inaccurate yesterday. Down 71 lbs from admit. Creatinine now trending down on po torsemide. CVP remains 12-13   Objective:   Temp:  [98.2 F (36.8 C)-98.6 F (37 C)] 98.4 F (36.9 C) (03/03 0531) Pulse Rate:  [63-72] 70 (03/02 2133) Resp:  [16] 16 (03/02 1340) BP: (101-112)/(33-62) 112/62 mmHg (03/02 2133) SpO2:  [96 %-98 %] 98 % (03/03 0700) Weight:  [352 lb 8 oz (159.893 kg)] 352 lb 8 oz (159.893 kg) (03/03 0531) Last BM Date: 06/28/15  Filed Weights   06/27/15 0437 06/28/15 0634 06/29/15 0531  Weight: 359 lb 1.6 oz (162.887 kg) 357 lb 3.2 oz (162.025 kg) 352 lb 8 oz (159.893 kg)    Intake/Output Summary (Last 24 hours) at 06/29/15 0839 Last data filed at 06/28/15 1801  Gross per 24 hour  Intake   2940 ml  Output    900 ml  Net   2040 ml    Telemetry: Reviewed personally, V-paced   Exam: CVP: 12-13 General:Morbidly Obese.  Pleasant, NAD. No resp difficulty Psych: Normal affect. HEENT: Normal Neck: Thick, JVP difficult, Carotids 2+. No thyromegaly or nodule noted.  Heart: PMI nondisplaced. RRR. 2/6 SEM RUSB with clear S2, distant heart sounds Lungs: CTAB, normal effort Abdomen: Morbidly obese, NT, ND, no HSM. No bruits or masses. +BS  Extremities: No clubbing, cyanosis. DP/PT/Radials 2+ and equal bilaterally. Continues to have 1+ edema into thighs.  Neuro: Alert and oriented X 3. Moves all extremities spontaneously.   Basic Metabolic Panel:  Recent Labs Lab 06/23/15 2008  06/26/15 0502 06/27/15 0503 06/28/15 0530 06/29/15 0530  NA  --   < > 140 139 139 141  K  --   < > 3.8  3.9 3.7 3.6  CL  --   < > 95* 95* 98* 98*  CO2  --   < > 32 32 31 27  GLUCOSE  --   < > 111* 97 100* 98  BUN  --   < > 71* 68* 61* 53*  CREATININE  --   < > 2.41* 2.41* 2.39* 2.25*  CALCIUM  --   < > 9.5 9.3 9.3 9.5  MG 1.7  --  1.8  --   --   --   < > = values in this interval not displayed.  Liver Function Tests: No results for input(s): AST, ALT, ALKPHOS, BILITOT, PROT, ALBUMIN in the last 168 hours.  CBC:  Recent Labs Lab 06/27/15 0503 06/28/15 1130 06/29/15 0530  WBC 7.5 7.2 7.4  HGB 7.7* 7.9* 7.7*  HCT 24.8* 26.0* 25.4*  MCV 93.2 93.2 93.0  PLT 147* 146* 142*    Cardiac Enzymes: No results for input(s): CKTOTAL, CKMB, CKMBINDEX, TROPONINI in the last 168 hours.  BNP: No results for input(s): PROBNP in the last 8760 hours.  Coagulation: No results for input(s): INR in the last 168 hours.   Medications:   Scheduled Medications: . allopurinol  100 mg Oral Daily  . antiseptic oral rinse  7 mL Mouth Rinse BID  .  atorvastatin  20 mg Oral Daily  . bisacodyl  10 mg Oral BID  . budesonide-formoterol  2 puff Inhalation BID  . cholecalciferol  5,000 Units Oral Daily  . insulin aspart  0-9 Units Subcutaneous TID WC  . loratadine  10 mg Oral Daily  . metoprolol succinate  25 mg Oral Daily  . multivitamin with minerals  1 tablet Oral Daily  . potassium chloride  20 mEq Oral BID  . saccharomyces boulardii  250 mg Oral BID  . sodium chloride flush  10-40 mL Intracatheter Q12H  . torsemide  40 mg Oral Daily  . cyanocobalamin  500 mcg Oral Daily  . vitamin C  500 mg Oral Daily    Infusions:    PRN Medications: acetaminophen **OR** acetaminophen, albuterol, benzonatate, ondansetron **OR** ondansetron (ZOFRAN) IV, sodium chloride flush     Assessment/Plan   Dyandra Alcivar is a 65 y.o. female with history of diastolic CHF LVEF 99991111 in [place] 02/2015, COPD, and Asthma who presented to [facility] 06/12/15 with worsening SOB for several day  1. Acute on chronic  systolic CHF: Echo 99991111 shows EF 40-45%, down from 50-55% reportedly in 02/2015.  With no ACS symptoms, would hold off on ischemic work up with her CKD.  - She remains volume overloaded but unable to push diuresis with ^ Cr. CVP 12.  - Will use torsemide 40 qam/20 qpm. Weight still coming down.  - K stable. Cr up slightly 1.89 -> 2.15 -> 2.19 -> 2.22 -> 2.41 -> 2.41 -> 2.39 -> 2.25 - UNNA boots placed 06/19/15. - Losartan on hold with elevated creatinine - Continue Toprol XL at low dose.  2. S/p Medtronic PPM: Interrogated by Medtronic, single chamber pacer, pacing 85% of the time. Battery and leads WNLs. - Chronic RV pacing may be contributing to cardiomyopathy, will need EP followup, consider LV lead though may be difficult with body habitus.  3. Chronic Afib: Continue Toprol XL and Xarelto, but with fall in hgb and BRBPR will need GI evaluation. This patient's CHA2DS2-VASc Score and unadjusted Ischemic Stroke Rate (4.8 % per year)  from a score of 4.  - Xarelto held with lower GI bleeding.  4. OSA/ ? OHS component - Continue CPAP nightly, on oxygen during the day.   5. CKD stage III:  - Creatinine stable to improved now on po torsemide.   6. Deconditioning - Working with PT. To SNF at discharge.  7. Anemia: Required 1 UPRBC 06/24/15.  BRBPR first reported 2/27. Chronically black stools on iron supplement. FOBT+.   - CBC stable today.  - GI plans inpatient colonoscopy for today.  - Will need to decide on long term anti-coag after.   8. Murmur: Mild AS, moderate AI on echo.   Dispo: May be best to hold until at least tomorrow with procedure today and continued medication adjustment. Will need close HF follow up.   Shirley Friar PA-C 8:39 AM   Advanced Heart Failure Team Pager [phone] (M-F; 7a - 4p)  Please contact [facility] for night-coverage after hours (4p -7a ) and weekends on [email or web address]  Patient seen with PA, agree with the above note.  Colonoscopy today with  mild hemorrhoids that were likely the cause of rectal bleeding and one polyp removed.  Should be ok to resume anticoagulation, will restart Xarelto, likely should be on 15 mg daily but will check with pharmacy.   Weight down about 70 lbs total.   From my standpoint, she can go to SNF.  Cardiac meds for d/c: torsemide 40 qam/20 qpm, KCl 40 daily, Xarelto 15 daily, atorvastatin 20 daily, Toprol XL 25 daily.   [name] 06/29/2015 1:02 PM

## 2015-06-29 NOTE — Discharge Summary (Signed)
Physician Discharge Summary  Robyn Porter [id number] DOB: [birth year] DOA: 06/12/2015  PCP: ADDIS,DANIEL, DO  Admit date: 06/12/2015 Discharge date: 06/29/2015  Time spent: 35 minutes  Recommendations for Outpatient Follow-up:  1. She will go to skilled nursing facility. 2. She'll follow-up with the heart failure team in 1 week's basic neurologic panel.   Discharge Diagnoses:  Principal Problem:   Acute systolic congestive heart failure, NYHA class 1 (HCC) Active Problems:   Acute blood loss anemia   Chronic atrial fibrillation (HCC)   Diabetes mellitus type 2 in obese (HCC)   History of pacemaker   Chronic anemia   OSA (obstructive sleep apnea)   AKI (acute kidney injury) ([place])   Morbid obesity (HCC)   NSVT (nonsustained ventricular tachycardia) (HCC)   CKD (chronic kidney disease)   Hematochezia   Chronic anticoagulation   Internal bleeding hemorrhoids   Benign neoplasm of transverse colon  Filed Weights   06/27/15 0437 06/28/15 0634 06/29/15 0531  Weight: 162.887 kg (359 lb 1.6 oz) 162.025 kg (357 lb 3.2 oz) 159.893 kg (352 lb 8 oz)    Discharge Condition: stable  Diet recommendation: low sodium  Filed Weights   06/27/15 0437 06/28/15 0634 06/29/15 0531  Weight: 162.887 kg (359 lb 1.6 oz) 162.025 kg (357 lb 3.2 oz) 159.893 kg (352 lb 8 oz)    History of present illness:  Serial-year-old female with past medical history of diabetes diastolic heart failure with an EF of 55% comes into the ED complaining of increased shortness of breath and increased weight over the last month.  Hospital Course:  Acute systolic and diastolic heart failure Cardiology was consulted and she was diuresis aggressively, her ACE inhibitor was held. Her creatinine rise to 2.4. Which is probably her new baseline. She probably lost about 27 kg over her hospital stay. Estimated dry weight on the day of discharge is 159.8 kg.  Status post Medtronic PPM: Interrogated by Medtronic, single  chamber pacer, pacing 85% of the time. Battery and leads WNLs. Chronic RV pacing may be contributing to cardiomyopathy, will need EP followup.  Chronic atrial fibrillation: Control on Toprol, CHA2DS2-VASc Score 4, GI was consulted to perform an EGD which show no signs of bleeding. GI recommended to restart Xarelto.  Acute GI bleed/normocytic anemia:  She received 1 unit of packed blood cells or hemoglobin had a mild drop. Her Xarelto was held GI was consulted and recommended a colonoscopy which was performed on 06/29/2015 that showed no signs of bleeding. A small polyp and internal hemorrhoid. Hemoglobin remained stable surgery recommended she restart her Xarelto.  NSVT: Cont to monitor L[place].  Proteus mirabilis UTI Completed ceftriaxone  Morbid obesity (HCC) counseling  CKD (chronic kidney disease) Unknown baseline ? 1.9-2.5. This seems to be her knew baseline. Con to monitor.  OSA: Cont c-pap.  Procedures:  Colonoscopy  Consultations:  GI  HF  Discharge Exam: Filed Vitals:   06/29/15 1120 06/29/15 1130  BP: 129/62 128/55  Pulse: 65 58  Temp:    Resp: 20 20    General: A&O x3 Cardiovascular: IRRegular rate and rhythm. Respiratory: good air movement CTA B/L  Discharge Instructions   Discharge Instructions    Diet - low sodium heart healthy    Complete by:  As directed      Increase activity slowly    Complete by:  As directed           Current Discharge Medication List    START taking these medications   Details  metoprolol succinate (TOPROL-XL) 25 MG 24 hr tablet Take 1 tablet (25 mg total) by mouth daily. Qty: 30 tablet, Refills: 3    potassium chloride SA (K-DUR,KLOR-CON) 20 MEQ tablet Take 1 tablet (20 mEq total) by mouth 2 (two) times daily. Qty: 30 tablet, Refills: 0    !! torsemide (DEMADEX) 20 MG tablet Take 1 tablet (20 mg total) by mouth daily after lunch. Qty: 30 tablet, Refills: 0    !! torsemide (DEMADEX) 20 MG tablet Take 2  tablets (40 mg total) by mouth every morning. Qty: 30 tablet, Refills: 0     !! - Potential duplicate medications found. Please discuss with provider.    CONTINUE these medications which have NOT CHANGED   Details  albuterol (PROVENTIL HFA;VENTOLIN HFA) 108 (90 Base) MCG/ACT inhaler Inhale 1 puff into the lungs every 6 (six) hours as needed for wheezing or shortness of breath.    albuterol (PROVENTIL) (2.5 MG/3ML) 0.083% nebulizer solution Take 2.5 mg by nebulization every 8 (eight) hours as needed for wheezing.  Refills: 0    allopurinol (ZYLOPRIM) 100 MG tablet Take 100 mg by mouth daily.    atorvastatin (LIPITOR) 20 MG tablet Take 20 mg by mouth daily.    benzonatate (TESSALON) 200 MG capsule Take 200 mg by mouth 3 (three) times daily as needed for cough.    budesonide-formoterol (SYMBICORT) 80-4.5 MCG/ACT inhaler Inhale 2 puffs into the lungs 2 (two) times daily.    Cholecalciferol (VITAMIN D3) 5000 units CAPS Take 1 capsule by mouth daily.    cyanocobalamin 500 MCG tablet Take 500 mcg by mouth daily.    ferrous sulfate 325 (65 FE) MG tablet Take 650 mg by mouth 2 (two) times daily with a meal.    loratadine (CLARITIN) 10 MG tablet Take 10 mg by mouth daily.    Multiple Vitamin (MULTIVITAMIN WITH MINERALS) TABS tablet Take 1 tablet by mouth daily.    rivaroxaban (XARELTO) 20 MG TABS tablet Take 20 mg by mouth daily with supper.    vitamin C (ASCORBIC ACID) 500 MG tablet Take 500 mg by mouth daily.      STOP taking these medications     BYDUREON 2 MG PEN      furosemide (LASIX) 20 MG tablet      losartan (COZAAR) 50 MG tablet      metoprolol (LOPRESSOR) 50 MG tablet        Allergies  Allergen Reactions  . Amoxicillin Hives  . Contrast Media [Iodinated Diagnostic Agents] Hives   Follow-up Information    Follow up with [facility] .   Specialty:  Skilled Nursing Facility   Contact information:   [address] [place]  [place] [phone]       The results of significant diagnostics from this hospitalization (including imaging, microbiology, ancillary and laboratory) are listed below for reference.    Significant Diagnostic Studies: Dg Chest 2 View  06/12/2015  CLINICAL DATA:  65 year old female with shortness of breath and cough for the past month EXAM: CHEST  2 VIEW COMPARISON:  None. FINDINGS: Left subclavian approach single lead cardiac rhythm maintenance device with the lead projected over the right ventricle. Cardiomegaly. Pulmonary vascular congestion with mild interstitial edema. Small bilateral pleural effusions with associated bibasilar atelectasis. No acute osseous abnormality. IMPRESSION: Mild-moderate CHF. Electronically Signed   By: Jacqulynn Cadet M.D.   On: 06/12/2015 20:23   Dg Chest Port 1 View  06/15/2015  CLINICAL DATA:  Evaluate line  placement. EXAM: PORTABLE CHEST 1 VIEW COMPARISON:  06/12/2015 FINDINGS: Again noted is a single lead left cardiac pacemaker. Placement of a right arm PICC line. The PICC catheter extends into the SVC but the tip is not well visualized. Heart is enlarged. Again noted are prominent central vascular structures. Trachea is midline. Negative for a pneumothorax. IMPRESSION: PICC line extends into the SVC region but the tip is not well visualized. Consider further evaluation with oblique images or lateral view. Again noted is cardiomegaly with enlarged central vascular structures. Findings are suggestive for vascular congestion or mild edema. Electronically Signed   By: Markus Daft M.D.   On: 06/15/2015 17:08    Microbiology: No results found for this or any previous visit (from the past 240 hour(s)).   Labs: Basic Metabolic Panel:  Recent Labs Lab 06/23/15 2008  06/25/15 0455 06/26/15 0502 06/27/15 0503 06/28/15 0530 06/29/15 0530  NA  --   < > 142 140 139 139 141  K  --   < > 3.9 3.8 3.9 3.7 3.6  CL  --   < > 96* 95* 95* 98* 98*  CO2  --   < >  33* 32 32 31 27  GLUCOSE  --   < > 103* 111* 97 100* 98  BUN  --   < > 68* 71* 68* 61* 53*  CREATININE  --   < > 2.22* 2.41* 2.41* 2.39* 2.25*  CALCIUM  --   < > 9.3 9.5 9.3 9.3 9.5  MG 1.7  --   --  1.8  --   --   --   < > = values in this interval not displayed. Liver Function Tests: No results for input(s): AST, ALT, ALKPHOS, BILITOT, PROT, ALBUMIN in the last 168 hours. No results for input(s): LIPASE, AMYLASE in the last 168 hours. No results for input(s): AMMONIA in the last 168 hours. CBC:  Recent Labs Lab 06/25/15 0455 06/26/15 0502 06/27/15 0503 06/28/15 1130 06/29/15 0530  WBC 6.9 8.4 7.5 7.2 7.4  HGB 8.4* 8.1* 7.7* 7.9* 7.7*  HCT 27.8* 26.3* 24.8* 26.0* 25.4*  MCV 92.4 92.3 93.2 93.2 93.0  PLT 131* 151 147* 146* 142*   Cardiac Enzymes: No results for input(s): CKTOTAL, CKMB, CKMBINDEX, TROPONINI in the last 168 hours. BNP: BNP (last 3 results)  Recent Labs  06/12/15 2328  BNP 940.9*    ProBNP (last 3 results) No results for input(s): PROBNP in the last 8760 hours.  CBG:  Recent Labs Lab 06/28/15 1134 06/28/15 1619 06/28/15 1956 06/29/15 0804 06/29/15 1214  GLUCAP 94 85 93 88 94     Signed:  Charlynne Cousins MD.  [facility] 06/29/2015, 1:39 PM

## 2015-06-30 ENCOUNTER — Encounter: Payer: Self-pay | Admitting: Internal Medicine

## 2015-06-30 DIAGNOSIS — D649 Anemia, unspecified: Secondary | ICD-10-CM

## 2015-06-30 DIAGNOSIS — J45998 Other asthma: Secondary | ICD-10-CM

## 2015-06-30 DIAGNOSIS — J45909 Unspecified asthma, uncomplicated: Secondary | ICD-10-CM | POA: Insufficient documentation

## 2015-06-30 LAB — BASIC METABOLIC PANEL
Anion gap: 14 (ref 5–15)
BUN: 54 mg/dL — AB (ref 6–20)
CALCIUM: 9.4 mg/dL (ref 8.9–10.3)
CO2: 26 mmol/L (ref 22–32)
CREATININE: 2.26 mg/dL — AB (ref 0.44–1.00)
Chloride: 99 mmol/L — ABNORMAL LOW (ref 101–111)
GFR calc Af Amer: 25 mL/min — ABNORMAL LOW (ref >60)
GFR, EST NON AFRICAN AMERICAN: 21 mL/min — AB (ref >60)
Glucose, Bld: 117 mg/dL — ABNORMAL HIGH (ref 65–99)
POTASSIUM: 3.5 mmol/L (ref 3.5–5.1)
SODIUM: 139 mmol/L (ref 135–145)

## 2015-06-30 LAB — GLUCOSE, CAPILLARY
GLUCOSE-CAPILLARY: 110 mg/dL — AB (ref 65–99)
Glucose-Capillary: 114 mg/dL — ABNORMAL HIGH (ref 65–99)

## 2015-06-30 MED ORDER — RIVAROXABAN 15 MG PO TABS: 15.0000 mg | ORAL_TABLET | Freq: Every day | ORAL | Status: DC

## 2015-06-30 MED ORDER — METOPROLOL SUCCINATE ER 25 MG PO TB24: 12.5000 mg | ORAL_TABLET | Freq: Every day | ORAL | Status: DC

## 2015-06-30 NOTE — Progress Notes (Signed)
ANTICOAGULATION CONSULT NOTE - Follow Up Consult  Pharmacy Consult for xarelto Indication: atrial fibrillation  Allergies  Allergen Reactions  . Amoxicillin Hives  . Contrast Media [Iodinated Diagnostic Agents] Hives    Patient Measurements: Height: 5\' 4"  (162.6 cm) Weight: (!) 353 lb 11.2 oz (160.437 kg) IBW/kg (Calculated) : 54.7  Vital Signs: Temp: 98.6 F (37 C) (03/04 0500) BP: 107/36 mmHg (03/04 0500) Pulse Rate: 76 (03/04 0500)  Labs:  Recent Labs  06/28/15 0530 06/28/15 1130 06/29/15 0530 06/30/15 0520  HGB  --  7.9* 7.7*  --   HCT  --  26.0* 25.4*  --   PLT  --  146* 142*  --   CREATININE 2.39*  --  2.25* 2.26*    Estimated Creatinine Clearance: 37.5 mL/min (by C-G formula based on Cr of 2.26).   Assessment: 65 yo f with afib.  Patient was restarted on Xarelto yesterday after clear colonoscopy at 20 mg daily - pt should have been restarted at renally adjusted dose of 15 mg daily.  Discharge summary reflects 20 mg daily.  Changed the discharge orders to 15 mg daily and called Dr. Olevia Bowens and he fixed his discharge summary to reflect the 15 mg daily.  Goal of Therapy:  Monitor platelets by anticoagulation protocol: Yes   Plan:  - Xarelto 15 mg PO once daily with supper for CrCl ~ 37 ml/min - Discharge orders and summary updated  Cassie L. Nicole Kindred, PharmD PGY2 Infectious Diseases Pharmacy Resident Pager: [phone] 06/30/2015 11:37 AM

## 2015-06-30 NOTE — Progress Notes (Signed)
Patient Name: Robyn Porter Date of Encounter: 06/30/2015  Principal Problem:   Acute systolic congestive heart failure, NYHA class 1 ([place]) Active Problems:   Chronic atrial fibrillation ([place])   Diabetes mellitus type 2 in obese ([place])   History of pacemaker   Chronic anemia   OSA (obstructive sleep apnea)   AKI (acute kidney injury) ([place])   Morbid obesity (HCC)   NSVT (nonsustained ventricular tachycardia) (HCC)   CKD (chronic kidney disease)   Hematochezia   Chronic anticoagulation   Acute blood loss anemia   Internal bleeding hemorrhoids   Benign neoplasm of transverse colon   Length of Stay: 17  SUBJECTIVE  Feeling generally better. Still grossly edematous. UO good, net diuresis slower. Stable creatinine  CURRENT MEDS . allopurinol  100 mg Oral Daily  . antiseptic oral rinse  7 mL Mouth Rinse BID  . atorvastatin  20 mg Oral Daily  . budesonide-formoterol  2 puff Inhalation BID  . cholecalciferol  5,000 Units Oral Daily  . insulin aspart  0-9 Units Subcutaneous TID WC  . loratadine  10 mg Oral Daily  . metoprolol succinate  25 mg Oral Daily  . multivitamin with minerals  1 tablet Oral Daily  . potassium chloride  20 mEq Oral BID  . rivaroxaban  20 mg Oral Q supper  . saccharomyces boulardii  250 mg Oral BID  . sodium chloride flush  10-40 mL Intracatheter Q12H  . torsemide  20 mg Oral Daily  . torsemide  40 mg Oral Daily  . cyanocobalamin  500 mcg Oral Daily  . vitamin C  500 mg Oral Daily    OBJECTIVE   Intake/Output Summary (Last 24 hours) at 06/30/15 1056 Last data filed at 06/30/15 0830  Gross per 24 hour  Intake    640 ml  Output   1750 ml  Net  -1110 ml   Filed Weights   06/28/15 0634 06/29/15 0531 06/30/15 0144  Weight: 162.025 kg (357 lb 3.2 oz) 159.893 kg (352 lb 8 oz) 160.437 kg (353 lb 11.2 oz)    PHYSICAL EXAM Filed Vitals:   06/29/15 2136 06/30/15 0144 06/30/15 0500 06/30/15 0854  BP: 136/47  107/36   Pulse: 72  76   Temp: 99 F  (37.2 C)  98.6 F (37 C)   TempSrc: Oral     Resp: 21  18   Height:      Weight:  160.437 kg (353 lb 11.2 oz)    SpO2: 94%  95% 96%   General: Alert, oriented x3, no distress, super morbid obesity Head: no evidence of trauma, PERRL, EOMI, no exophtalmos or lid lag, no myxedema, no xanthelasma; normal ears, nose and oropharynx Neck: 10-12 cm jugular venous pulsations and no hepatojugular reflux; brisk carotid pulses without delay and no carotid bruits Chest: clear to auscultation, no signs of consolidation by percussion or palpation, normal fremitus, symmetrical and full respiratory excursions Cardiovascular: regular rhythm, normal first and paradoxically split second heart sounds, no rubs or gallops, no murmur Abdomen: no tenderness or distention, no masses by palpation, no abnormal pulsatility or arterial bruits, normal bowel sounds, no hepatosplenomegaly Extremities: no clubbing, cyanosis or edema; 2+ radial, ulnar and brachial pulses bilaterally; 2+ right femoral, posterior tibial and dorsalis pedis pulses; 2+ left femoral, posterior tibial and dorsalis pedis pulses; no subclavian or femoral bruits Neurological: grossly nonfocal  LABS  CBC  Recent Labs  06/28/15 1130 06/29/15 0530  WBC 7.2 7.4  HGB 7.9* 7.7*  HCT 26.0* 25.4*  MCV 93.2 93.0  PLT 146* [id number]*   Basic Metabolic Panel  Recent Labs  06/29/15 0530 06/30/15 0520  NA 141 139  K 3.6 3.5  CL 98* 99*  CO2 27 26  GLUCOSE 98 117*  BUN 53* 54*  CREATININE 2.25* 2.26*  CALCIUM 9.5 9.4    Radiology Studies Imaging results have been reviewed and No results found.  TELE V paced, afib   ASSESSMENT AND PLAN  1. Acute on chronic systolic CHF: Echo 99991111 shows EF 40-45%, down from 50-55% reportedly in 02/2015. With no ACS symptoms, would hold off on ischemic work up with her CKD.  - She remains volume overloaded but unable to push diuresis with ^ Cr. CVP 12.  - Will use torsemide 40 qam/20 qpm. Weight still  coming down.  - K stable. Cr up slightly 1.89 -> 2.15 -> 2.19 -> 2.22 -> 2.41 -> 2.41 -> 2.39 -> 2.25 - UNNA boots placed 06/19/15. - Losartan on hold with elevated creatinine - Continue Toprol XL at low dose.  2. S/p Medtronic PPM: Interrogated by Medtronic, single chamber pacer, pacing 85% of the time. Battery and leads WNLs. - I would recommend reducing metoprolol dose to see if this would allow for less V pacing, hopefully with positive impact on CHF. - will need EP followup, consider LV lead though may be difficult with body habitus.  3. Chronic Afib: Back on renal-dose Xarelto, after benign colonoscopy findings This patient's CHA2DS2-VASc Score and unadjusted Ischemic Stroke Rate (4.8 % per year) from a score of 4.  4. OSA/ ? OHS  - Continue CPAP nightly, on oxygen during the day.   5. CKD stage III:  - Creatinine stable to improved now on po torsemide.  6. Deconditioning - Working with PT. To SNF at discharge.  7. Anemia 8. Mild AS, moderate AI on echo.   Sanda Klein, MD, [facility] [phone] office [phone] pager 06/30/2015 10:56 AM

## 2015-06-30 NOTE — Progress Notes (Signed)
Pt d/c'd to [facility]. RN will call report. IV removed

## 2015-06-30 NOTE — Clinical Social Work Placement (Signed)
   CLINICAL SOCIAL WORK PLACEMENT  NOTE  Date:  06/30/2015  Patient Details  Name: Robyn Porter MRN: [id number] Date of Birth: 29-Oct-1950  Clinical Social Work is seeking post-discharge placement for this patient at the [place] level of care (*CSW will initial, date and re-position this form in  chart as items are completed):  Yes   Patient/family provided with [facility] Work Department's list of facilities offering this level of care within the geographic area requested by the patient (or if unable, by the patient's family).  Yes   Patient/family informed of their freedom to choose among providers that offer the needed level of care, that participate in Medicare, Medicaid or managed care program needed by the patient, have an available bed and are willing to accept the patient.  Yes   Patient/family informed of [place]'s ownership interest in [facility] and [facility], as well as of the fact that they are under no obligation to receive care at these facilities.  PASRR submitted to EDS on 06/22/15     PASRR number received on 06/22/15     Existing PASRR number confirmed on       FL2 transmitted to all facilities in geographic area requested by pt/family on 06/22/15     FL2 transmitted to all facilities within larger geographic area on       Patient informed that his/her managed care company has contracts with or will negotiate with certain facilities, including the following:        Yes   Patient/family informed of bed offers received.  Patient chooses bed at [facility]     Physician recommends and patient chooses bed at      Patient to be transferred to [facility] on 06/30/15.  Patient to be transferred to facility by [facility]     Patient family notified on 06/30/15 of transfer.  Name of family member notified:        PHYSICIAN       Additional Comment:     _______________________________________________ Benard Halsted, [place] 06/30/2015, 11:00 AM

## 2015-06-30 NOTE — Progress Notes (Signed)
Orthopedic Tech Progress Note Patient Details:  Robyn Porter Sep 02, 1950 [id number]  Ortho Devices Type of Ortho Device: Louretta Parma boot Ortho Device/Splint Location: bilateral Ortho Device/Splint Interventions: Application   Hildred Priest 06/30/2015, 11:38 AM

## 2015-06-30 NOTE — Progress Notes (Signed)
[facility] PROGRESS NOTE Interim History: Robyn Porter is a 65 y.o. female with history of diastolic CHF last EF measured in November 2016 was 50-55% presents to the ER because of increasing shortness of breath and increased weight. Patient states that over the last 1 month patient has been noticing increasing shortness of breath both on sitting and exertion. Denies any chest pain. Patient's Lasix was recently increased to 20 mg twice a day. Patient states she has been to the ER at least twice over the last 1 month.  Filed Weights   06/28/15 0634 06/29/15 0531 06/30/15 0144  Weight: 162.025 kg (357 lb 3.2 oz) 159.893 kg (352 lb 8 oz) 160.437 kg (353 lb 11.2 oz)        Intake/Output Summary (Last 24 hours) at 06/30/15 [id number] Last data filed at 06/30/15 0830  Gross per 24 hour  Intake    640 ml  Output   1750 ml  Net  -1110 ml     Assessment/Plan: Acute systolic congestive heart failure, NYHA class 1 ([place]) She has remained on torsemide, metoprolol, the patient has remained stable.  S/p Medtronic PPM:  Interrogated by Medtronic, single chamber pacer, pacing 85% of the time. Battery and leads WNLs. Chronic RV pacing may be contributing to cardiomyopathy, will need EP followup  Chronic A.fib: HR controlled, cont Toprol XL. CHA2DS2-VASc Score 4, colonoscopy clean continue Xarelto hemoglobin continues to be stable.  Acute lower GI bleed/Normocytic anemia; Received 1 unit of PRBC due to drop in Hbg. Continue Xarelto hemoglobin stable.   CKD (chronic kidney disease) Creatinine is stable and slowly improving. Remains negative.  OSA: Cont c-pap.  Decondition: SNF when stable. PT cont to follow.   Code Status: full Family Communication: none  Disposition Plan: SNF today   Consultants:  GI  [facility]  Procedures: ECHO: EF 45%  Antibiotics:  Complete rocephin course.  HPI/Subjective: Feels great no complains, she's redeveloped a skilled nursing  facility.  Objective: Filed Vitals:   06/29/15 2136 06/30/15 0144 06/30/15 0500 06/30/15 0854  BP: 136/47  107/36   Pulse: 72  76   Temp: 99 F (37.2 C)  98.6 F (37 C)   TempSrc: Oral     Resp: 21  18   Height:      Weight:  160.437 kg (353 lb 11.2 oz)    SpO2: 94%  95% 96%     Exam:  General: Alert, awake, oriented x3, in no acute distress.  HEENT: No bruits, no goiter.  Heart: Regular rate and rhythm. Lungs: Good air movement and clear Abdomen: Soft, nontender, nondistended, positive bowel sounds.  Neuro: Grossly intact, nonfocal.   Data Reviewed: Basic Metabolic Panel:  Recent Labs Lab 06/23/15 2008  06/26/15 0502 06/27/15 0503 06/28/15 0530 06/29/15 0530 06/30/15 0520  NA  --   < > 140 139 139 141 139  K  --   < > 3.8 3.9 3.7 3.6 3.5  CL  --   < > 95* 95* 98* 98* 99*  CO2  --   < > 32 32 31 27 26   GLUCOSE  --   < > 111* 97 100* 98 117*  BUN  --   < > 71* 68* 61* 53* 54*  CREATININE  --   < > 2.41* 2.41* 2.39* 2.25* 2.26*  CALCIUM  --   < > 9.5 9.3 9.3 9.5 9.4  MG 1.7  --  1.8  --   --   --   --   < > =  values in this interval not displayed. Liver Function Tests: No results for input(s): AST, ALT, ALKPHOS, BILITOT, PROT, ALBUMIN in the last 168 hours. No results for input(s): LIPASE, AMYLASE in the last 168 hours. No results for input(s): AMMONIA in the last 168 hours. CBC:  Recent Labs Lab 06/25/15 0455 06/26/15 0502 06/27/15 0503 06/28/15 1130 06/29/15 0530  WBC 6.9 8.4 7.5 7.2 7.4  HGB 8.4* 8.1* 7.7* 7.9* 7.7*  HCT 27.8* 26.3* 24.8* 26.0* 25.4*  MCV 92.4 92.3 93.2 93.2 93.0  PLT 131* 151 147* 146* 142*   Cardiac Enzymes: No results for input(s): CKTOTAL, CKMB, CKMBINDEX, TROPONINI in the last 168 hours. BNP (last 3 results)  Recent Labs  06/12/15 2328  BNP 940.9*    ProBNP (last 3 results) No results for input(s): PROBNP in the last 8760 hours.  CBG:  Recent Labs Lab 06/28/15 1956 06/29/15 0804 06/29/15 1214 06/29/15 2131  06/30/15 0804  GLUCAP 93 88 94 101* 110*    No results found for this or any previous visit (from the past 240 hour(s)).   Studies: No results found.  Scheduled Meds: . allopurinol  100 mg Oral Daily  . antiseptic oral rinse  7 mL Mouth Rinse BID  . atorvastatin  20 mg Oral Daily  . budesonide-formoterol  2 puff Inhalation BID  . cholecalciferol  5,000 Units Oral Daily  . insulin aspart  0-9 Units Subcutaneous TID WC  . loratadine  10 mg Oral Daily  . metoprolol succinate  25 mg Oral Daily  . multivitamin with minerals  1 tablet Oral Daily  . potassium chloride  20 mEq Oral BID  . rivaroxaban  20 mg Oral Q supper  . saccharomyces boulardii  250 mg Oral BID  . sodium chloride flush  10-40 mL Intracatheter Q12H  . torsemide  20 mg Oral Daily  . torsemide  40 mg Oral Daily  . cyanocobalamin  500 mcg Oral Daily  . vitamin C  500 mg Oral Daily   Continuous Infusions: . sodium chloride       Charlynne Cousins  [facility] Pager [phone], If 7PM-7AM, please contact night-coverage at [email or web address], password [facility] 06/30/2015, 9:28 AM  LOS: 17 days

## 2015-06-30 NOTE — Discharge Summary (Addendum)
Physician Discharge Summary  Robyn Porter [id number] DOB: 1950-06-11 DOA: 06/12/2015  PCP: ADDIS,DANIEL, DO  Admit date: 06/12/2015 Discharge date: 06/30/2015  Time spent: 35 minutes  Recommendations for Outpatient Follow-up:  1. She will go to skilled nursing facility. 2. She'll follow-up with the heart failure team in 1 week's basic neurologic panel.   Discharge Diagnoses:  Principal Problem:   Acute systolic congestive heart failure, NYHA class 1 (HCC) Active Problems:   Acute blood loss anemia   Chronic atrial fibrillation (HCC)   Diabetes mellitus type 2 in obese (HCC)   History of pacemaker   Chronic anemia   OSA (obstructive sleep apnea)   AKI (acute kidney injury) ([place])   Morbid obesity (HCC)   NSVT (nonsustained ventricular tachycardia) (HCC)   CKD (chronic kidney disease)   Hematochezia   Chronic anticoagulation   Internal bleeding hemorrhoids   Benign neoplasm of transverse colon   Asthma, mild  Filed Weights   06/28/15 0634 06/29/15 0531 06/30/15 0144  Weight: 162.025 kg (357 lb 3.2 oz) 159.893 kg (352 lb 8 oz) 160.437 kg (353 lb 11.2 oz)    Discharge Condition: stable  Diet recommendation: low sodium  Filed Weights   06/28/15 0634 06/29/15 0531 06/30/15 0144  Weight: 162.025 kg (357 lb 3.2 oz) 159.893 kg (352 lb 8 oz) 160.437 kg (353 lb 11.2 oz)    History of present illness:  Serial-year-old female with past medical history of diabetes diastolic heart failure with an EF of 55% comes into the ED complaining of increased shortness of breath and increased weight over the last month.  Hospital Course:  Acute systolic and diastolic heart failure Cardiology was consulted and she was diuresis aggressively, her ACE inhibitor was held. Her creatinine rise to 2.4. Which is probably her new baseline. She probably lost about 27 kg over her hospital stay. Estimated dry weight on the day of discharge is 159.8 kg.  Status post Medtronic PPM: Interrogated by  Medtronic, single chamber pacer, pacing 85% of the time. Battery and leads WNLs. Chronic RV pacing may be contributing to cardiomyopathy, will need EP followup.  Chronic atrial fibrillation: Control on Toprol, CHA2DS2-VASc Score 4, GI was consulted to perform an EGD which show no signs of bleeding. GI recommended to restart Xarelto.  Acute GI bleed/normocytic anemia:  She received 1 unit of packed blood cells or hemoglobin had a mild drop. Her Xarelto was held GI was consulted and recommended a colonoscopy which was performed on 06/29/2015 that showed no signs of bleeding. A small polyp and internal hemorrhoid. Hemoglobin remained stable surgery recommended she restart her Xarelto.  NSVT: Cont to monitor L[place].  Proteus mirabilis UTI Completed ceftriaxone  Morbid obesity (HCC) counseling  CKD (chronic kidney disease) Unknown baseline ? 1.9-2.5. This seems to be her knew baseline. Con to monitor.  OSA: Cont c-pap.  Procedures:  Colonoscopy  Consultations:  GI  HF  Discharge Exam: Filed Vitals:   06/29/15 2136 06/30/15 0500  BP: 136/47 107/36  Pulse: 72 76  Temp: 99 F (37.2 C) 98.6 F (37 C)  Resp: 21 18    General: A&O x3 Cardiovascular: IRRegular rate and rhythm. Respiratory: good air movement CTA B/L  Discharge Instructions   Discharge Instructions    Diet - low sodium heart healthy    Complete by:  As directed      Diet - low sodium heart healthy    Complete by:  As directed      Increase activity slowly  Complete by:  As directed      Increase activity slowly    Complete by:  As directed           Current Discharge Medication List    START taking these medications   Details  metoprolol succinate (TOPROL-XL) 25 MG 24 hr tablet Take 1 tablet (25 mg total) by mouth daily. Qty: 30 tablet, Refills: 3    potassium chloride SA (K-DUR,KLOR-CON) 20 MEQ tablet Take 1 tablet (20 mEq total) by mouth 2 (two) times daily. Qty: 30 tablet, Refills: 0     !! torsemide (DEMADEX) 20 MG tablet Take 1 tablet (20 mg total) by mouth daily after lunch. Qty: 30 tablet, Refills: 0    !! torsemide (DEMADEX) 20 MG tablet Take 2 tablets (40 mg total) by mouth every morning. Qty: 30 tablet, Refills: 0     !! - Potential duplicate medications found. Please discuss with provider.    CONTINUE these medications which have CHANGED   Details  rivaroxaban (XARELTO) 15 MG TABS tablet Take 1 tablet (15 mg total) by mouth daily with supper. Qty: 30 tablet, Refills: 5      CONTINUE these medications which have NOT CHANGED   Details  albuterol (PROVENTIL HFA;VENTOLIN HFA) 108 (90 Base) MCG/ACT inhaler Inhale 1 puff into the lungs every 6 (six) hours as needed for wheezing or shortness of breath.    albuterol (PROVENTIL) (2.5 MG/3ML) 0.083% nebulizer solution Take 2.5 mg by nebulization every 8 (eight) hours as needed for wheezing.  Refills: 0    allopurinol (ZYLOPRIM) 100 MG tablet Take 100 mg by mouth daily.    atorvastatin (LIPITOR) 20 MG tablet Take 20 mg by mouth daily.    benzonatate (TESSALON) 200 MG capsule Take 200 mg by mouth 3 (three) times daily as needed for cough.    budesonide-formoterol (SYMBICORT) 80-4.5 MCG/ACT inhaler Inhale 2 puffs into the lungs 2 (two) times daily.    Cholecalciferol (VITAMIN D3) 5000 units CAPS Take 1 capsule by mouth daily.    cyanocobalamin 500 MCG tablet Take 500 mcg by mouth daily.    ferrous sulfate 325 (65 FE) MG tablet Take 650 mg by mouth 2 (two) times daily with a meal.    loratadine (CLARITIN) 10 MG tablet Take 10 mg by mouth daily.    Multiple Vitamin (MULTIVITAMIN WITH MINERALS) TABS tablet Take 1 tablet by mouth daily.    vitamin C (ASCORBIC ACID) 500 MG tablet Take 500 mg by mouth daily.      STOP taking these medications     BYDUREON 2 MG PEN      furosemide (LASIX) 20 MG tablet      losartan (COZAAR) 50 MG tablet      metoprolol (LOPRESSOR) 50 MG tablet        Allergies   Allergen Reactions  . Amoxicillin Hives  . Contrast Media [Iodinated Diagnostic Agents] Hives   Follow-up Information    Follow up with [facility] .   Specialty:  Flasher information:   [address] [place] [phone]      Follow up with [name], MD On 07/09/2015.   Specialty:  Cardiology   Why:  at 1030 for post hospital follow up. Please bring all of your medications to your visit. The code for pt parking is 0010.   Contact information:   [address] [place] [address] [phone]  The results of significant diagnostics from this hospitalization (including imaging, microbiology, ancillary and laboratory) are listed below for reference.    Significant Diagnostic Studies: Dg Chest 2 View  06/12/2015  CLINICAL DATA:  65 year old female with shortness of breath and cough for the past month EXAM: CHEST  2 VIEW COMPARISON:  None. FINDINGS: Left subclavian approach single lead cardiac rhythm maintenance device with the lead projected over the right ventricle. Cardiomegaly. Pulmonary vascular congestion with mild interstitial edema. Small bilateral pleural effusions with associated bibasilar atelectasis. No acute osseous abnormality. IMPRESSION: Mild-moderate CHF. Electronically Signed   By: Jacqulynn Cadet M.D.   On: 06/12/2015 20:23   Dg Chest Port 1 View  06/15/2015  CLINICAL DATA:  Evaluate line placement. EXAM: PORTABLE CHEST 1 VIEW COMPARISON:  06/12/2015 FINDINGS: Again noted is a single lead left cardiac pacemaker. Placement of a right arm PICC line. The PICC catheter extends into the SVC but the tip is not well visualized. Heart is enlarged. Again noted are prominent central vascular structures. Trachea is midline. Negative for a pneumothorax. IMPRESSION: PICC line extends into the SVC region but the tip is not well visualized. Consider further evaluation with  oblique images or lateral view. Again noted is cardiomegaly with enlarged central vascular structures. Findings are suggestive for vascular congestion or mild edema. Electronically Signed   By: Markus Daft M.D.   On: 06/15/2015 17:08    Microbiology: No results found for this or any previous visit (from the past 240 hour(s)).   Labs: Basic Metabolic Panel:  Recent Labs Lab 06/23/15 2008  06/26/15 0502 06/27/15 0503 06/28/15 0530 06/29/15 0530 06/30/15 0520  NA  --   < > 140 139 139 141 139  K  --   < > 3.8 3.9 3.7 3.6 3.5  CL  --   < > 95* 95* 98* 98* 99*  CO2  --   < > 32 32 31 27 26   GLUCOSE  --   < > 111* 97 100* 98 117*  BUN  --   < > 71* 68* 61* 53* 54*  CREATININE  --   < > 2.41* 2.41* 2.39* 2.25* 2.26*  CALCIUM  --   < > 9.5 9.3 9.3 9.5 9.4  MG 1.7  --  1.8  --   --   --   --   < > = values in this interval not displayed. Liver Function Tests: No results for input(s): AST, ALT, ALKPHOS, BILITOT, PROT, ALBUMIN in the last 168 hours. No results for input(s): LIPASE, AMYLASE in the last 168 hours. No results for input(s): AMMONIA in the last 168 hours. CBC:  Recent Labs Lab 06/25/15 0455 06/26/15 0502 06/27/15 0503 06/28/15 1130 06/29/15 0530  WBC 6.9 8.4 7.5 7.2 7.4  HGB 8.4* 8.1* 7.7* 7.9* 7.7*  HCT 27.8* 26.3* 24.8* 26.0* 25.4*  MCV 92.4 92.3 93.2 93.2 93.0  PLT 131* 151 147* 146* 142*   Cardiac Enzymes: No results for input(s): CKTOTAL, CKMB, CKMBINDEX, TROPONINI in the last 168 hours. BNP: BNP (last 3 results)  Recent Labs  06/12/15 2328  BNP 940.9*    ProBNP (last 3 results) No results for input(s): PROBNP in the last 8760 hours.  CBG:  Recent Labs Lab 06/28/15 1956 06/29/15 0804 06/29/15 1214 06/29/15 2131 06/30/15 0804  GLUCAP 93 88 94 101* 110*     Signed:  Charlynne Cousins MD.  [facility] 06/30/2015, 11:25 AM

## 2015-06-30 NOTE — Progress Notes (Signed)
Patient will DC to: [facility] Anticipated DC date: 06/30/15 Family notified: Patient will call niece Transport by: PTAR 1:30pm  CSW signing off.  Cedric Fishman, [facility] Social Worker [phone]

## 2015-07-01 NOTE — Progress Notes (Signed)
This encounter was created in error - please disregard.

## 2015-07-02 ENCOUNTER — Encounter (HOSPITAL_COMMUNITY): Payer: Self-pay | Admitting: Internal Medicine

## 2015-07-02 NOTE — Anesthesia Postprocedure Evaluation (Signed)
Anesthesia Post Note  Patient: Robyn Porter  Procedure(s) Performed: Procedure(s) (LRB): COLONOSCOPY (N/A)  Patient location during evaluation: Endoscopy Anesthesia Type: MAC Level of consciousness: awake Pain management: pain level controlled Respiratory status: spontaneous breathing Cardiovascular status: stable Anesthetic complications: no    Last Vitals:  Filed Vitals:   06/29/15 2136 06/30/15 0500  BP: 136/47 107/36  Pulse: 72 76  Temp: 37.2 C 37 C  Resp: 21 18    Last Pain:  Filed Vitals:   06/30/15 1218  PainSc: 0-No pain                 EDWARDS,Donnel Venuto

## 2015-07-03 ENCOUNTER — Encounter: Payer: Self-pay | Admitting: Internal Medicine

## 2015-07-03 ENCOUNTER — Non-Acute Institutional Stay (SKILLED_NURSING_FACILITY): Payer: BLUE CROSS/BLUE SHIELD | Admitting: Internal Medicine

## 2015-07-03 DIAGNOSIS — K921 Melena: Secondary | ICD-10-CM | POA: Diagnosis not present

## 2015-07-03 DIAGNOSIS — A498 Other bacterial infections of unspecified site: Secondary | ICD-10-CM

## 2015-07-03 DIAGNOSIS — I482 Chronic atrial fibrillation, unspecified: Secondary | ICD-10-CM

## 2015-07-03 DIAGNOSIS — I5043 Acute on chronic combined systolic (congestive) and diastolic (congestive) heart failure: Secondary | ICD-10-CM | POA: Diagnosis not present

## 2015-07-03 DIAGNOSIS — E559 Vitamin D deficiency, unspecified: Secondary | ICD-10-CM | POA: Diagnosis not present

## 2015-07-03 DIAGNOSIS — B964 Proteus (mirabilis) (morganii) as the cause of diseases classified elsewhere: Secondary | ICD-10-CM

## 2015-07-03 DIAGNOSIS — M1000 Idiopathic gout, unspecified site: Secondary | ICD-10-CM | POA: Diagnosis not present

## 2015-07-03 DIAGNOSIS — D62 Acute posthemorrhagic anemia: Secondary | ICD-10-CM | POA: Diagnosis not present

## 2015-07-03 DIAGNOSIS — N183 Chronic kidney disease, stage 3 (moderate): Secondary | ICD-10-CM

## 2015-07-03 DIAGNOSIS — G4733 Obstructive sleep apnea (adult) (pediatric): Secondary | ICD-10-CM | POA: Diagnosis not present

## 2015-07-03 DIAGNOSIS — E785 Hyperlipidemia, unspecified: Secondary | ICD-10-CM | POA: Diagnosis not present

## 2015-07-03 DIAGNOSIS — I4729 Other ventricular tachycardia: Secondary | ICD-10-CM

## 2015-07-03 DIAGNOSIS — I472 Ventricular tachycardia: Secondary | ICD-10-CM

## 2015-07-03 NOTE — Progress Notes (Signed)
MRN: [id number] Name: Robyn Porter  Sex: female Age: 65 y.o. DOB: Jun 25, 1950  [place] #: [facility] Facility/Room:515 Level Of Care: SNF Provider: Inocencio Homes D Emergency Contacts: Extended Emergency Contact Information Primary Emergency Contact: Lovena Neighbours [place] Phone: [phone] Relation: Nephew  Code Status:   Allergies: Amoxicillin and Contrast media  Chief Complaint  Patient presents with  . New Admit To SNF    HPI: Patient is 65 y.o. female with past medical history of diabetes diastolic heart failure with an EF of 55% comes into the ED complaining of increased shortness of breath and increased weight over the last month. Pt was admitted to [facility] from 2/14-3/4 where she was treated for acute on chronic systolic and diastolic CHF. Hospital course was complicated by a GI bleed, requiring tansfusion, NSVT, and a proteus UTI. Pt is admitted to SNF with generalized weakness for OT/PT. While at SNF pt will be followed for HLD, tx withlipitor, gout, tx with allopurinol and Vit D def, tx withVit D replacement.  Past Medical History  Diagnosis Date  . COPD (chronic obstructive pulmonary disease) ([place])   . Hypertension   . Diabetes mellitus without complication ([place])   . Afib ([place])   . Pacemaker     Past Surgical History  Procedure Laterality Date  . Pacemaker insertion    . Abdominal hysterectomy    . Colonoscopy N/A 06/29/2015    Procedure: COLONOSCOPY;  Surgeon: Irene Shipper, MD;  Location: [facility];  Service: Endoscopy;  Laterality: N/A;      Medication List       This list is accurate as of: 07/03/15 11:59 PM.  Always use your most recent med list.               albuterol 108 (90 Base) MCG/ACT inhaler  Commonly known as:  PROVENTIL HFA;VENTOLIN HFA  Inhale 1 puff into the lungs every 6 (six) hours as needed for wheezing or shortness of breath.     albuterol (2.5 MG/3ML) 0.083% nebulizer solution  Commonly known as:  PROVENTIL   Take 2.5 mg by nebulization every 8 (eight) hours as needed for wheezing.     allopurinol 100 MG tablet  Commonly known as:  ZYLOPRIM  Take 100 mg by mouth daily.     atorvastatin 20 MG tablet  Commonly known as:  LIPITOR  Take 20 mg by mouth daily.     benzonatate 200 MG capsule  Commonly known as:  TESSALON  Take 200 mg by mouth 3 (three) times daily as needed for cough.     budesonide-formoterol 80-4.5 MCG/ACT inhaler  Commonly known as:  SYMBICORT  Inhale 2 puffs into the lungs 2 (two) times daily.     cyanocobalamin 500 MCG tablet  Take 500 mcg by mouth daily.     ferrous sulfate 325 (65 FE) MG tablet  Take 650 mg by mouth 2 (two) times daily with a meal.     loratadine 10 MG tablet  Commonly known as:  CLARITIN  Take 10 mg by mouth daily.     metoprolol succinate 25 MG 24 hr tablet  Commonly known as:  TOPROL-XL  Take 1 tablet (25 mg total) by mouth daily.     multivitamin with minerals Tabs tablet  Take 1 tablet by mouth daily.     potassium chloride SA 20 MEQ tablet  Commonly known as:  K-DUR,KLOR-CON  Take 1 tablet (20 mEq total) by mouth 2 (two) times daily.  Rivaroxaban 15 MG Tabs tablet  Commonly known as:  XARELTO  Take 1 tablet (15 mg total) by mouth daily with supper.     torsemide 20 MG tablet  Commonly known as:  DEMADEX  Take 1 tablet (20 mg total) by mouth daily after lunch.     torsemide 20 MG tablet  Commonly known as:  DEMADEX  Take 2 tablets (40 mg total) by mouth every morning.     vitamin C 500 MG tablet  Commonly known as:  ASCORBIC ACID  Take 500 mg by mouth daily.     Vitamin D3 5000 units Caps  Take 1 capsule by mouth daily.        No orders of the defined types were placed in this encounter.     There is no immunization history on file for this patient.  Social History  Substance Use Topics  . Smoking status: Passive Smoke Exposure - Never Smoker  . Smokeless tobacco: Never Used  . Alcohol Use: No    Family  history is + COPD, colon CA  Review of Systems  DATA OBTAINED: from patient GENERAL:  no fevers, fatigue, appetite changes SKIN: No itching, rash or wounds EYES: No eye pain, redness, discharge EARS: No earache, tinnitus, change in hearing NOSE: No congestion, drainage or bleeding  MOUTH/THROAT: No mouth or tooth pain, No sore throat RESPIRATORY: No cough, wheezing, SOB CARDIAC: No chest pain, palpitations, lower extremity edema  GI: No abdominal pain, No N/V/D or constipation, No heartburn or reflux  GU: No dysuria, frequency or urgency, or incontinence  MUSCULOSKELETAL: No unrelieved bone/joint pain NEUROLOGIC: No headache, dizziness or focal weakness PSYCHIATRIC: No c/o anxiety or sadness   Filed Vitals:   07/03/15 1306  BP: 130/76  Pulse: 78  Temp: 97.6 F (36.4 C)  Resp: 19    SpO2 Readings from Last 1 Encounters:  06/30/15 96%        Physical Exam  GENERAL APPEARANCE: Alert, obese WF,conversant,  No acute distress.  SKIN: No diaphoresis rash HEAD: Normocephalic, atraumatic  EYES: Conjunctiva/lids clear. Pupils round, reactive. EOMs intact.  EARS: External exam WNL, canals clear. Hearing grossly normal.  NOSE: No deformity or discharge.  MOUTH/THROAT: Lips w/o lesions  RESPIRATORY: Breathing is even, unlabored. Lung sounds are slt rales  CARDIOVASCULAR: Heart RRR no murmurs, rubs or gallops. No peripheral edema.   GASTROINTESTINAL: Abdomen is soft, non-tender, not distended w/ normal bowel sounds. GENITOURINARY: Bladder non tender, not distended  MUSCULOSKELETAL: No abnormal joints or musculature NEUROLOGIC:  Cranial nerves 2-12 grossly intact. Moves all extremities  PSYCHIATRIC: Mood and affect appropriate to situation, no behavioral issues  Patient Active Problem List   Diagnosis Date Noted  . Proteus mirabilis infection 07/08/2015  . HLD (hyperlipidemia) 07/08/2015  . Gout 07/08/2015  . Vitamin D deficiency 07/08/2015  . Asthma, mild   . Internal  bleeding hemorrhoids   . Benign neoplasm of transverse colon   . Hematochezia   . Chronic anticoagulation   . Acute blood loss anemia   . Morbid obesity ([place])   . NSVT (nonsustained ventricular tachycardia) ([place])   . CKD (chronic kidney disease)   . Acute on chronic combined systolic and diastolic congestive heart failure ([place])   . Cardiomyopathy ([place])   . AKI (acute kidney injury) ([place])   . Acute systolic congestive heart failure, NYHA class 1 ([place]) 06/13/2015  . Chronic atrial fibrillation ([place]) 06/13/2015  . Diabetes mellitus type 2 in obese ([place]) 06/13/2015  . History of pacemaker  06/13/2015  . Chronic anemia 06/13/2015  . OSA (obstructive sleep apnea) 06/13/2015    CBC    Component Value Date/Time   WBC 7.4 06/29/2015 0530   RBC 2.73* 06/29/2015 0530   RBC 3.00* 06/14/2015 0540   HGB 7.7* 06/29/2015 0530   HCT 25.4* 06/29/2015 0530   PLT 142* 06/29/2015 0530   MCV 93.0 06/29/2015 0530   LYMPHSABS 1.1 06/13/2015 0322   MONOABS 0.7 06/13/2015 0322   EOSABS 0.0 06/13/2015 0322   BASOSABS 0.0 06/13/2015 0322    CMP     Component Value Date/Time   NA 139 06/30/2015 0520   K 3.5 06/30/2015 0520   CL 99* 06/30/2015 0520   CO2 26 06/30/2015 0520   GLUCOSE 117* 06/30/2015 0520   BUN 54* 06/30/2015 0520   CREATININE 2.26* 06/30/2015 0520   CALCIUM 9.4 06/30/2015 0520   GFRNONAA 21* 06/30/2015 0520   GFRAA 25* 06/30/2015 0520    No results found for: HGBA1C   Dg Chest 2 View  06/12/2015  CLINICAL DATA:  65 year old female with shortness of breath and cough for the past month EXAM: CHEST  2 VIEW COMPARISON:  None. FINDINGS: Left subclavian approach single lead cardiac rhythm maintenance device with the lead projected over the right ventricle. Cardiomegaly. Pulmonary vascular congestion with mild interstitial edema. Small bilateral pleural effusions with associated bibasilar atelectasis. No acute osseous abnormality. IMPRESSION: Mild-moderate CHF. Electronically Signed    By: Jacqulynn Cadet M.D.   On: 06/12/2015 20:23    Not all labs, radiology exams or other studies done during hospitalization come through on my EPIC note; however they are reviewed by me.    Assessment and Plan  Acute on chronic combined systolic and diastolic congestive heart failure [facility]) Cardiology was consulted and she was diuresis aggressively, her ACE inhibitor was held. Her creatinine rise to 2.4. Which is probably her new baseline. She probably lost about 27 kg over her hospital stay. Estimated dry weight on the day of discharge is 159.8 kg. SNF - cont demadex and toprol  Chronic atrial fibrillation (HCC) Control on Toprol, CHA2DS2-VASc Score 4, GI was consulted to perform an EGD which show no signs of bleeding. GI recommended to restart Xarelto. SNF - cont toprol for rate and xarelto as prophylaxis  Hematochezia She received 1 unit of packed blood cells or hemoglobin had a mild drop. Her Xarelto was held GI was consulted and recommended a colonoscopy which was performed on 06/29/2015 that showed no signs of bleeding. A small polyp and internal hemorrhoid. Hemoglobin remained stable surgery recommended she restart her Xarelto. SNF - will f/u with CBC  NSVT (nonsustained ventricular tachycardia) (HCC) Pt with pacer and on toprol; SNF - f/u BMP for lytes  Proteus mirabilis infection Completed ceftriaxone   CKD (chronic kidney disease) Unknown baseline ? 1.9-2.5. This seems to be her knew baseline. SNF - will monitor with BMP  Acute blood loss anemia SNF 2/2 GI bled, no source found; d/c HB 7.7; continue iron and B12, will f/u with CBC  HLD (hyperlipidemia) SNF - not stated as uncontrolled; cont lipitor  OSA (obstructive sleep apnea) SNF - conr CPAP  Gout SNF - cont allopurinol as prophylaxis  Vitamin D deficiency SNF - cont repletion , 5000 u daily   Time spent > 45 min;> 50% of time with patient was spent reviewing records, labs, tests and studies,  counseling and developing plan of care  Hennie Duos, MD

## 2015-07-08 ENCOUNTER — Encounter: Payer: Self-pay | Admitting: Internal Medicine

## 2015-07-08 DIAGNOSIS — M109 Gout, unspecified: Secondary | ICD-10-CM | POA: Insufficient documentation

## 2015-07-08 DIAGNOSIS — E785 Hyperlipidemia, unspecified: Secondary | ICD-10-CM | POA: Insufficient documentation

## 2015-07-08 DIAGNOSIS — E559 Vitamin D deficiency, unspecified: Secondary | ICD-10-CM | POA: Insufficient documentation

## 2015-07-08 DIAGNOSIS — A498 Other bacterial infections of unspecified site: Secondary | ICD-10-CM | POA: Insufficient documentation

## 2015-07-08 NOTE — Assessment & Plan Note (Signed)
She received 1 unit of packed blood cells or hemoglobin had a mild drop. Her Xarelto was held GI was consulted and recommended a colonoscopy which was performed on 06/29/2015 that showed no signs of bleeding. A small polyp and internal hemorrhoid. Hemoglobin remained stable surgery recommended she restart her Xarelto. SNF - will f/u with CBC

## 2015-07-08 NOTE — Assessment & Plan Note (Signed)
Unknown baseline ? 1.9-2.5. This seems to be her knew baseline. SNF - will monitor with BMP

## 2015-07-08 NOTE — Assessment & Plan Note (Signed)
Control on Toprol, CHA2DS2-VASc Score 4, GI was consulted to perform an EGD which show no signs of bleeding. GI recommended to restart Xarelto. SNF - cont toprol for rate and xarelto as prophylaxis

## 2015-07-08 NOTE — Assessment & Plan Note (Signed)
Cardiology was consulted and she was diuresis aggressively, her ACE inhibitor was held. Her creatinine rise to 2.4. Which is probably her new baseline. She probably lost about 27 kg over her hospital stay. Estimated dry weight on the day of discharge is 159.8 kg. SNF - cont demadex and toprol

## 2015-07-08 NOTE — Assessment & Plan Note (Signed)
Completed ceftriaxone

## 2015-07-08 NOTE — Assessment & Plan Note (Signed)
Pt with pacer and on toprol; SNF - f/u BMP for lytes

## 2015-07-08 NOTE — Assessment & Plan Note (Signed)
SNF - not stated as uncontrolled; cont lipitor

## 2015-07-08 NOTE — Assessment & Plan Note (Signed)
SNF - cont repletion , 5000 u daily

## 2015-07-08 NOTE — Assessment & Plan Note (Signed)
SNF 2/2 GI bled, no source found; d/c HB 7.7; continue iron and B12, will f/u with CBC

## 2015-07-08 NOTE — Assessment & Plan Note (Signed)
SNF - cont allopurinol as prophylaxis

## 2015-07-08 NOTE — Assessment & Plan Note (Signed)
SNF - conr CPAP

## 2015-07-09 ENCOUNTER — Ambulatory Visit (HOSPITAL_COMMUNITY)
Admit: 2015-07-09 | Discharge: 2015-07-09 | Disposition: A | Discharge status: Home / Self Care | Payer: BLUE CROSS/BLUE SHIELD | Source: Ambulatory Visit | Attending: Cardiology | Admitting: Cardiology

## 2015-07-09 ENCOUNTER — Encounter (HOSPITAL_COMMUNITY): Payer: Self-pay

## 2015-07-09 VITALS — BP 112/64 | HR 88 | Wt 355.0 lb

## 2015-07-09 DIAGNOSIS — I482 Chronic atrial fibrillation, unspecified: Secondary | ICD-10-CM

## 2015-07-09 DIAGNOSIS — I5043 Acute on chronic combined systolic (congestive) and diastolic (congestive) heart failure: Secondary | ICD-10-CM | POA: Diagnosis not present

## 2015-07-09 DIAGNOSIS — Z9981 Dependence on supplemental oxygen: Secondary | ICD-10-CM | POA: Diagnosis not present

## 2015-07-09 DIAGNOSIS — I5022 Chronic systolic (congestive) heart failure: Secondary | ICD-10-CM | POA: Diagnosis not present

## 2015-07-09 DIAGNOSIS — Z7901 Long term (current) use of anticoagulants: Secondary | ICD-10-CM | POA: Diagnosis not present

## 2015-07-09 DIAGNOSIS — R001 Bradycardia, unspecified: Secondary | ICD-10-CM | POA: Insufficient documentation

## 2015-07-09 DIAGNOSIS — N183 Chronic kidney disease, stage 3 (moderate): Secondary | ICD-10-CM | POA: Diagnosis not present

## 2015-07-09 DIAGNOSIS — Z79899 Other long term (current) drug therapy: Secondary | ICD-10-CM | POA: Insufficient documentation

## 2015-07-09 DIAGNOSIS — J45909 Unspecified asthma, uncomplicated: Secondary | ICD-10-CM | POA: Diagnosis not present

## 2015-07-09 DIAGNOSIS — Z950 Presence of cardiac pacemaker: Secondary | ICD-10-CM | POA: Diagnosis not present

## 2015-07-09 DIAGNOSIS — E662 Morbid (severe) obesity with alveolar hypoventilation: Secondary | ICD-10-CM | POA: Diagnosis not present

## 2015-07-09 LAB — BRAIN NATRIURETIC PEPTIDE: B Natriuretic Peptide: 133 pg/mL — ABNORMAL HIGH (ref 0.0–100.0)

## 2015-07-09 LAB — BASIC METABOLIC PANEL
Anion gap: 14 (ref 5–15)
BUN: 52 mg/dL — AB (ref 6–20)
CO2: 24 mmol/L (ref 22–32)
CREATININE: 2.03 mg/dL — AB (ref 0.44–1.00)
Calcium: 9.6 mg/dL (ref 8.9–10.3)
Chloride: 107 mmol/L (ref 101–111)
GFR calc Af Amer: 28 mL/min — ABNORMAL LOW (ref >60)
GFR, EST NON AFRICAN AMERICAN: 24 mL/min — AB (ref >60)
GLUCOSE: 105 mg/dL — AB (ref 65–99)
POTASSIUM: 4 mmol/L (ref 3.5–5.1)
Sodium: 145 mmol/L (ref 135–145)

## 2015-07-09 LAB — CBC
HEMATOCRIT: 26.5 % — AB (ref 36.0–46.0)
Hemoglobin: 8 g/dL — ABNORMAL LOW (ref 12.0–15.0)
MCH: 28.4 pg (ref 26.0–34.0)
MCHC: 30.2 g/dL (ref 30.0–36.0)
MCV: 94 fL (ref 78.0–100.0)
Platelets: 235 10*3/uL (ref 150–400)
RBC: 2.82 MIL/uL — ABNORMAL LOW (ref 3.87–5.11)
RDW: 18.1 % — AB (ref 11.5–15.5)
WBC: 7.8 10*3/uL (ref 4.0–10.5)

## 2015-07-09 MED ORDER — TORSEMIDE 20 MG PO TABS: ORAL_TABLET | ORAL | Status: DC

## 2015-07-09 MED ORDER — POTASSIUM CHLORIDE CRYS ER 20 MEQ PO TBCR: EXTENDED_RELEASE_TABLET | ORAL | Status: DC

## 2015-07-09 NOTE — Progress Notes (Signed)
Advanced Heart Failure Medication Review by a Pharmacist  Does the patient  feel that his/her medications are working for him/her?  yes  Has the patient been experiencing any side effects to the medications prescribed?  no  Does the patient measure his/her own blood pressure or blood glucose at home?  Facility [facility])   Does the patient have any problems obtaining medications due to transportation or finances?   no  Understanding of regimen: Good Understanding of indications: excellent Potential of compliance: In SNF currently  Patient understands to avoid NSAIDs. Patient understands to avoid decongestants.  Issues to address at subsequent visits: None   Pharmacist comments: 65 YO pleasant female presents to clinic from [facility] with family member.  Reviewed [facility] MAR to update medication.  Pt denies s/sx of bleeding on xarelto.  Pt denies SE to other medications.      Time with patient: 10 min  Preparation and documentation time: 5 min Total time: 15 min

## 2015-07-09 NOTE — Progress Notes (Signed)
Patient ID: Robyn Porter, female   DOB: [birth year], 65 y.o.   MRN: [id number] PCP: Dr. Marveen Reeks [facility]) Cardiology: Dr. Aundra Dubin  65 yo with history of OHS/OSA, chronic systolic CHF and chronic atrial fibrillation presents for CHF clinic followup.  She was admitted in 2/17 to [facility] with massive volume overload.  She was aggressively diuresed in the hospital and lost around 70 lbs.  She had BRBPR in the hospital, this was thought to be hemorrhoidal bleeding (had colonoscopy).  Echo in 2/17 showed EF 40-45% with mild AS and mild AI.  She was discharged to SNF.  She has been stable in SNF, weight is about the same as at discharge.  She can walk about 100 feet now with a walker and is then short of breath.  Mild orthopnea.  No PND, no chest pain.  Wearing Unna boots.  No BRBPR/melena.   ECG: Atrial fibrillation, v-paced.   Labs (3/17): K 3.5, creatinine 2.26, HCT 25.4  PMH: 1. Asthma 2. OHS/OSA: CPAP, home oxygen. 3. CHF: Chronic systolic CHF.   - Echo ([id number]) with EF 40-45%, mild aortic stenosis, moderate aortic insufficiency.  4. Aortic valve disorder: Mild aortic stenosis, moderate aortic insufficiency on 2/17 echo. 5. Chronic atrial fibrillation. 6. CKD stage III 7. Lower GI bleeding in 2/17, likely hemorrhoidal.   8. 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    SH: Currently in SNF but normally lives in [place] with nephew.  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  Nonsmoker, no ETOH.   FH: No premature CAD.  ROS: All systems reviewed and negative except as per HPI.   Current Outpatient Prescriptions  Medication Sig Dispense Refill  . albuterol (PROVENTIL HFA;VENTOLIN HFA) 108 (90 Base) MCG/ACT inhaler Inhale 1 puff into the lungs every 6 (six) hours as needed for wheezing or shortness of breath.    Marland Kitchen albuterol (PROVENTIL) (2.5 MG/3ML) 0.083% nebulizer solution Take 2.5 mg by nebulization every 8 (eight) hours as needed for wheezing.   0  . allopurinol (ZYLOPRIM)  100 MG tablet Take 100 mg by mouth daily.    Marland Kitchen atorvastatin (LIPITOR) 20 MG tablet Take 20 mg by mouth daily.    . benzonatate (TESSALON) 200 MG capsule Take 200 mg by mouth 3 (three) times daily as needed for cough.    . budesonide-formoterol (SYMBICORT) 80-4.5 MCG/ACT inhaler Inhale 2 puffs into the lungs 2 (two) times daily.    . Cholecalciferol (VITAMIN D3) 5000 units CAPS Take 1 capsule by mouth daily.    . cyanocobalamin 500 MCG tablet Take 500 mcg by mouth daily.    . ferrous sulfate 325 (65 FE) MG tablet Take 650 mg by mouth 2 (two) times daily with a meal.    . loratadine (CLARITIN) 10 MG tablet Take 10 mg by mouth daily.    . metoprolol succinate (TOPROL-XL) 25 MG 24 hr tablet Take 1 tablet (25 mg total) by mouth daily. 30 tablet 3  . Multiple Vitamin (MULTIVITAMIN WITH MINERALS) TABS tablet Take 1 tablet by mouth daily. Reported on 07/09/2015    . potassium chloride SA (K-DUR,KLOR-CON) 20 MEQ tablet Take 51meq (2 tablets) in the morning and 60meq (1 tablet)in the evening 90 tablet 0  . rivaroxaban (XARELTO) 20 MG TABS tablet Take 20 mg by mouth daily with supper.    . torsemide (DEMADEX) 20 MG tablet Take 1 tablet (20 mg total) by mouth daily after lunch. 30 tablet 0  . torsemide (DEMADEX) 20 MG tablet Take 60mg  (3 tablets) in the  morning and 40mg  ( 2 tablets ) in the evening. 150 tablet 3  . vitamin C (ASCORBIC ACID) 500 MG tablet Take 500 mg by mouth daily.     No current facility-administered medications for this encounter.   BP 112/64 mmHg  Pulse 88  Wt 355 lb (161.027 kg)  SpO2 97% General: NAD, obese.  Neck: JVP 12 cm, no thyromegaly or thyroid nodule.  Lungs: Clear to auscultation bilaterally with normal respiratory effort. CV: Nondisplaced PMI.  Heart regular S1/S2, no S3/S4, 2/6 SEM RUSB with clear S2.  Legs wrapped.  No carotid bruit.  Normal pedal pulses.  Abdomen: Soft, nontender, no hepatosplenomegaly, no distention.  Skin: Intact without lesions or rashes.   Neurologic: Alert and oriented x 3.  Psych: Normal affect. Extremities: No clubbing or cyanosis.  HEENT: Normal.   Assessment/Plan: 1. Chronic systolic CHF: Suspect nonischemia.  EF 40-45% on 2/17 echo.  She remains volume overloaded on exam.  Creatinine earlier this month has above baseline at 2.26.  NYHA class III symptoms.  It is possible that frequent RV pacing has worsened her LV function.  - Continue Unna boots. - Increase torsemide to 60 qam/40 qpm and KCl to 40 qam/20 qpm.  BMET/BNP today and again in 10 days.  - Continue Toprol XL 25 daily. No ACEIARB with elevated creatinine.  2. Symptomatic bradycardia: MDT PPM.  Interrogated in the hospital, had high percentage RV pacing.  I will have her followup with EP to establish in pacer clinic. Will need to investigate whether we could could cut back on RV pacing at all, alternatively could consider CRT upgrade given EF 40-45% and frequent RV pacing.  3. CKD: Stage III.  Check BMET today and will need to recheck in 10 days on new diuretic regimen.  4. Chronic atrial fibrillation: She continues on Toprol XL and Xarelto. No further BRBPR or melena.  5. OHS/OSA: Continue CPAP at night and oxygen during the day.  She will need pulmonary followup.   [name] 07/09/2015

## 2015-07-09 NOTE — Patient Instructions (Signed)
Increase Torsemide to 60mg  in the AM and 40mg  in the PM .  Increase Potassium to 68meq in the AM and 20 meq in the PM.  Follow up in 10days with Dr.McLean

## 2015-07-16 ENCOUNTER — Non-Acute Institutional Stay (SKILLED_NURSING_FACILITY): Payer: BLUE CROSS/BLUE SHIELD | Admitting: Internal Medicine

## 2015-07-16 ENCOUNTER — Encounter: Payer: Self-pay | Admitting: Internal Medicine

## 2015-07-16 DIAGNOSIS — I4891 Unspecified atrial fibrillation: Secondary | ICD-10-CM | POA: Diagnosis not present

## 2015-07-16 DIAGNOSIS — N189 Chronic kidney disease, unspecified: Secondary | ICD-10-CM | POA: Diagnosis not present

## 2015-07-16 DIAGNOSIS — I5043 Acute on chronic combined systolic (congestive) and diastolic (congestive) heart failure: Secondary | ICD-10-CM | POA: Diagnosis not present

## 2015-07-16 DIAGNOSIS — D62 Acute posthemorrhagic anemia: Secondary | ICD-10-CM

## 2015-07-16 NOTE — Progress Notes (Signed)
Patient ID: Robyn Porter, female   DOB: [birth year], 65 y.o.   MRN: [id number] MRN: [id number] Name: Robyn Porter  Sex: female Age: 65 y.o. DOB: 26-Jan-1951  [place] #: [facility] Facility/Room:515 Level Of Care: SNF Provider: Wille Celeste Emergency Contacts: Extended Emergency Contact Information Primary Emergency Contact: Lovena Neighbours [place] of [place] Phone: [phone] Relation: Nephew  Code Status:   Allergies: Amoxicillin; Contrast media; and Shellfish-derived products  Chief Complaint  Patient presents with  . Discharge Note    HPI: Patient is 65 y.o. female with past medical history of diabetes diastolic heart failure with an EF of 55% comes into the ED complaining of increased shortness of breath and increased weight over the last month. Pt was admitted to [facility] from 2/14-3/4 where she was treated for acute on chronic systolic and diastolic CHF. Hospital course was complicated by a GI bleed, requiring tansfusion, NSVT, and a proteus UTI. Pt was admitted to SNF with generalized weakness for OT/PT. While at SNF pt efollowed for HLD, tx withlipitor, gout, tx with allopurinol and Vit D def, tx withVit D replacement.  She has gained strength during her stay here but will need a wheelchair for ambulation-she does have cardiology follow-up scheduled.  Her torsemide and potassium were recently increased by cardiology on March 13 they do have follow-up scheduled.  In regards to her anemia hemoglobin most recently 7.5 on lab done on March 16-have written orders for this to be redrawn when she sees cardiology.  Currently she has no complaints has great strength her spirits remain quite good-she will need continued PT for strengthening as well as nursing support for her multiple medical issues as noted above  Past Medical History  Diagnosis Date  . COPD (chronic obstructive pulmonary disease) ([place])   . Hypertension   . Diabetes mellitus without complication ([place])    . Afib ([place])   . Pacemaker     Past Surgical History  Procedure Laterality Date  . Pacemaker insertion    . Abdominal hysterectomy    . Colonoscopy N/A 06/29/2015    Procedure: COLONOSCOPY;  Surgeon: Irene Shipper, MD;  Location: [place];  Service: Endoscopy;  Laterality: N/A;      Medication List       This list is accurate as of: 07/16/15 11:59 PM.  Always use your most recent med list.               albuterol 108 (90 Base) MCG/ACT inhaler  Commonly known as:  PROVENTIL HFA;VENTOLIN HFA  Inhale 1 puff into the lungs every 6 (six) hours as needed for wheezing or shortness of breath.     albuterol (2.5 MG/3ML) 0.083% nebulizer solution  Commonly known as:  PROVENTIL  Take 2.5 mg by nebulization every 8 (eight) hours as needed for wheezing.     allopurinol 100 MG tablet  Commonly known as:  ZYLOPRIM  Take 100 mg by mouth daily.     atorvastatin 20 MG tablet  Commonly known as:  LIPITOR  Take 20 mg by mouth daily.     benzonatate 200 MG capsule  Commonly known as:  TESSALON  Take 200 mg by mouth 3 (three) times daily as needed for cough.     budesonide-formoterol 80-4.5 MCG/ACT inhaler  Commonly known as:  SYMBICORT  Inhale 2 puffs into the lungs 2 (two) times daily.     cyanocobalamin 500 MCG tablet  Take 500 mcg by mouth daily.     ferrous sulfate 325 (65  FE) MG tablet  Take 650 mg by mouth 2 (two) times daily with a meal.     loratadine 10 MG tablet  Commonly known as:  CLARITIN  Take 10 mg by mouth daily.     metoprolol succinate 25 MG 24 hr tablet  Commonly known as:  TOPROL-XL  Take 1 tablet (25 mg total) by mouth daily.     multivitamin with minerals Tabs tablet  Take 1 tablet by mouth daily. Reported on 07/09/2015     potassium chloride SA 20 MEQ tablet  Commonly known as:  K-DUR,KLOR-CON  Take 39meq (2 tablets) in the morning and 27meq (1 tablet)in the evening     rivaroxaban 20 MG Tabs tablet  Commonly known as:  XARELTO  Take 20 mg by  mouth daily with supper.     torsemide 20 MG tablet  Commonly known as:  DEMADEX  Take 1 tablet (20 mg total) by mouth daily after lunch.     torsemide 20 MG tablet  Commonly known as:  DEMADEX  Take 60mg  (3 tablets) in the morning and 40mg  ( 2 tablets ) in the evening.     vitamin C 500 MG tablet  Commonly known as:  ASCORBIC ACID  Take 500 mg by mouth daily.     Vitamin D3 5000 units Caps  Take 1 capsule by mouth daily.        No orders of the defined types were placed in this encounter.    Immunization History  Administered Date(s) Administered  . PPD Test 06/30/2015    Social History  Substance Use Topics  . Smoking status: Passive Smoke Exposure - Never Smoker  . Smokeless tobacco: Never Used  . Alcohol Use: No    Family history is + COPD, colon CA  Review of Systems  DATA OBTAINED: from patient GENERAL:  no fevers, fatigue, appetite changes SKIN: No itching, rash or wounds EYES: No eye pain, redness, discharge EARS: No earache, tinnitus, change in hearing NOSE: No congestion, drainage or bleeding  MOUTH/THROAT: No mouth or tooth pain, No sore throat RESPIRATORY: No cough, wheezing, SOB CARDIAC: No chest pain, palpitations, lower extremity edema  GI: No abdominal pain, No N/V/D or constipation, No heartburn or reflux  GU: No dysuria, frequency or urgency, or incontinence  MUSCULOSKELETAL: No unrelieved bone/joint pain--continues to ambulate largely in wheelchair and using a walker NEUROLOGIC: No headache, dizziness or focal weakness PSYCHIATRIC: No c/o anxiety or sadness   Filed Vitals:   07/16/15 2322  BP: 124/63  Pulse: 79  Temp: 98.1 F (36.7 C)  Resp: 18    SpO2 Readings from Last 1 Encounters:  07/19/15 97%        Physical Exam  GENERAL APPEARANCE: Alert, obese WF,conversant,  No acute distress.  SKIN: No diaphoresis rash HEAD: Normocephalic, atraumatic  EYES: Conjunctiva/lids clear. Pupils round, reactive. EOMs intact.  EARS:  External exam WNL, canals clear. Hearing grossly normal.  NOSE: No deformity or discharge.  MOUTH/THROAT: Lips w/o lesions  RESPIRATORY: Breathing is even, unlabored. Lung sounds are slt rales  CARDIOVASCULAR: Heart RRRWith an occasional irregular beat no murmurs, rubs or gallops. Legs are wrapped bilaterally  GASTROINTESTINAL: Abdomen is soft, non-tender, not distended w/ normal bowel sounds. GENITOURINARY: Bladder non tender, not distended  MUSCULOSKELETAL: No abnormal joints or musculature NEUROLOGIC:  Cranial nerves 2-12 grossly intact. Moves all extremities uses a wheelchair and walker for ambulation PSYCHIATRIC: Mood and affect appropriate to situation, no behavioral issues  Patient Active Problem List  Diagnosis Date Noted  . Chronic systolic CHF (congestive heart failure) ([place]) 07/09/2015  . Obesity hypoventilation syndrome ([place]) 07/09/2015  . Proteus mirabilis infection 07/08/2015  . HLD (hyperlipidemia) 07/08/2015  . Gout 07/08/2015  . Vitamin D deficiency 07/08/2015  . Asthma, mild   . Internal bleeding hemorrhoids   . Benign neoplasm of transverse colon   . Hematochezia   . Chronic anticoagulation   . Acute blood loss anemia   . Morbid obesity ([place])   . NSVT (nonsustained ventricular tachycardia) ([place])   . CKD (chronic kidney disease)   . Acute on chronic combined systolic and diastolic congestive heart failure ([place])   . Cardiomyopathy ([place])   . AKI (acute kidney injury) ([place])   . Acute systolic congestive heart failure, NYHA class 1 ([place]) 06/13/2015  . Chronic atrial fibrillation ([place]) 06/13/2015  . Diabetes mellitus type 2 in obese ([place]) 06/13/2015  . History of pacemaker 06/13/2015  . Chronic anemia 06/13/2015  . OSA (obstructive sleep apnea) 06/13/2015     Labs.  07/12/2015.  WBC 8.8 hemoglobin 7.5 platelets 237.  Sodium 141 potassium 3.8 BUN 58 creatinine 1.67.  07/09/2015.  BNP 133.  Sodium 145 potassium 4 BUN 52 creatinine 2.03.  WBC 7.8  hemoglobin 8.0 platelets 231.  CBGs have run  mainly in the 100s range   No results found for: HGBA1C   Dg Chest 2 View  06/12/2015  CLINICAL DATA:  66 year old female with shortness of breath and cough for the past month EXAM: CHEST  2 VIEW COMPARISON:  None. FINDINGS: Left subclavian approach single lead cardiac rhythm maintenance device with the lead projected over the right ventricle. Cardiomegaly. Pulmonary vascular congestion with mild interstitial edema. Small bilateral pleural effusions with associated bibasilar atelectasis. No acute osseous abnormality. IMPRESSION: Mild-moderate CHF. Electronically Signed   By: Jacqulynn Cadet M.D.   On: 06/12/2015 20:23    Not all labs, radiology exams or other studies done during hospitalization come through on my EPIC note; however they are reviewed by me.    Assessment and Plan  History of acute on chronic systolic and diastolic CHF-she is followed by cardiology received aggressive diuresis in the hospital ACE inhibitor was held-creatinine most recently 1.67 on lab done on March 16-she was seen recently by cardiology in her torsemide and potassium were increased-she does have cardiology follow-up later this week.  History of chronic atrial fibrillation continues on Toprol-GI did perform an EGD which showed no signs of bleeding-he had a recommended to restart that Xarelto continues on Toprol for rate and Xarelto for prophylaxis.  #3 Hematochezia--she did receive a transfusion in the hospital-Xarelto was held at one point GI was consulted and rhythmic and then did a colonoscopy which was performed in early March on the third that showed no signs of bleeding-hemoglobin remained stable and surgery recommended she restart her Xarelto-hemoglobin has dropped a bit this will need close follow-up she does have cardiology point later this week have written orders for CBC to be drawn for follow-up here -does continue on iron and B12  History of non-ST V.  tach she does have a pacer is on Toprol this appears rate controlled.  History of chronic kidney disease most recent creatinine as noted above 1.67 on March 16 this appears as she had bit lower than her baseline this will need to be updated later this week as well.  History of hyperlipidemia she is on Lipitor will defer follow-up to cardiology and primary care provider since her stay here is  been relatively short.  History of gout continues on allopurinol as prophylaxis.  Again she will need PT for further strengthening at home as well as a wheelchair for ambulation secondary to continued weakness will need nursing and home health support for numerous medical issues as noted above-labs to be drawn later this week as well to follow-up anemia and renal insufficiency.  [id number] note greater than 30 minutes spent on this discharge summary-greater than 50% of time spent coordinating plan of care for numerous diagnoses-prescriptions have been written        Bently Morath C,

## 2015-07-19 ENCOUNTER — Ambulatory Visit (HOSPITAL_COMMUNITY)
Admission: RE | Admit: 2015-07-19 | Discharge: 2015-07-19 | Disposition: A | Discharge status: Home / Self Care | Payer: BLUE CROSS/BLUE SHIELD | Source: Ambulatory Visit | Attending: Cardiology | Admitting: Cardiology

## 2015-07-19 ENCOUNTER — Telehealth: Payer: Self-pay

## 2015-07-19 ENCOUNTER — Encounter (HOSPITAL_COMMUNITY): Payer: Self-pay

## 2015-07-19 ENCOUNTER — Other Ambulatory Visit: Payer: Self-pay

## 2015-07-19 VITALS — BP 126/78 | HR 88 | Wt 356.8 lb

## 2015-07-19 DIAGNOSIS — E662 Morbid (severe) obesity with alveolar hypoventilation: Secondary | ICD-10-CM | POA: Insufficient documentation

## 2015-07-19 DIAGNOSIS — Z950 Presence of cardiac pacemaker: Secondary | ICD-10-CM | POA: Insufficient documentation

## 2015-07-19 DIAGNOSIS — Z7901 Long term (current) use of anticoagulants: Secondary | ICD-10-CM | POA: Diagnosis not present

## 2015-07-19 DIAGNOSIS — R001 Bradycardia, unspecified: Secondary | ICD-10-CM | POA: Diagnosis not present

## 2015-07-19 DIAGNOSIS — I482 Chronic atrial fibrillation: Secondary | ICD-10-CM | POA: Insufficient documentation

## 2015-07-19 DIAGNOSIS — I5022 Chronic systolic (congestive) heart failure: Secondary | ICD-10-CM | POA: Diagnosis not present

## 2015-07-19 DIAGNOSIS — J45909 Unspecified asthma, uncomplicated: Secondary | ICD-10-CM | POA: Diagnosis not present

## 2015-07-19 DIAGNOSIS — N183 Chronic kidney disease, stage 3 (moderate): Secondary | ICD-10-CM | POA: Insufficient documentation

## 2015-07-19 DIAGNOSIS — Z79899 Other long term (current) drug therapy: Secondary | ICD-10-CM | POA: Diagnosis not present

## 2015-07-19 DIAGNOSIS — I352 Nonrheumatic aortic (valve) stenosis with insufficiency: Secondary | ICD-10-CM | POA: Diagnosis not present

## 2015-07-19 LAB — CBC
HEMATOCRIT: 23.6 % — AB (ref 36.0–46.0)
HEMOGLOBIN: 7.1 g/dL — AB (ref 12.0–15.0)
MCH: 28.5 pg (ref 26.0–34.0)
MCHC: 30.1 g/dL (ref 30.0–36.0)
MCV: 94.8 fL (ref 78.0–100.0)
Platelets: 165 10*3/uL (ref 150–400)
RBC: 2.49 MIL/uL — ABNORMAL LOW (ref 3.87–5.11)
RDW: 18.9 % — AB (ref 11.5–15.5)
WBC: 8.6 10*3/uL (ref 4.0–10.5)

## 2015-07-19 LAB — BASIC METABOLIC PANEL
Anion gap: 12 (ref 5–15)
BUN: 57 mg/dL — AB (ref 6–20)
CHLORIDE: 107 mmol/L (ref 101–111)
CO2: 24 mmol/L (ref 22–32)
CREATININE: 1.94 mg/dL — AB (ref 0.44–1.00)
Calcium: 9.2 mg/dL (ref 8.9–10.3)
GFR calc Af Amer: 30 mL/min — ABNORMAL LOW (ref >60)
GFR calc non Af Amer: 26 mL/min — ABNORMAL LOW (ref >60)
GLUCOSE: 129 mg/dL — AB (ref 65–99)
POTASSIUM: 3.8 mmol/L (ref 3.5–5.1)
Sodium: 143 mmol/L (ref 135–145)

## 2015-07-19 LAB — BRAIN NATRIURETIC PEPTIDE: B Natriuretic Peptide: 149 pg/mL — ABNORMAL HIGH (ref 0.0–100.0)

## 2015-07-19 MED ORDER — POTASSIUM CHLORIDE CRYS ER 20 MEQ PO TBCR: 40.0000 meq | EXTENDED_RELEASE_TABLET | Freq: Two times a day (BID) | ORAL | Status: DC

## 2015-07-19 MED ORDER — POTASSIUM CHLORIDE CRYS ER 20 MEQ PO TBCR: EXTENDED_RELEASE_TABLET | ORAL | Status: DC

## 2015-07-19 MED ORDER — TORSEMIDE 20 MG PO TABS: ORAL_TABLET | ORAL | Status: DC

## 2015-07-19 NOTE — Progress Notes (Signed)
Patient ID: Robyn Porter, female   DOB: 23-Jun-1950, 65 y.o.   MRN: [id number] PCP: Dr. Marveen Reeks [facility]) Cardiology: Dr. Aundra Dubin  65 yo with history of OHS/OSA, chronic systolic CHF and chronic atrial fibrillation presents for CHF clinic followup.  She was admitted in 2/17 to [facility] with massive volume overload.  She was aggressively diuresed in the hospital and lost around 70 lbs.  She had BRBPR in the hospital, this was thought to be hemorrhoidal bleeding (had colonoscopy).  Echo in 2/17 showed EF 40-45% with mild AS and mild AI.  She was discharged to SNF.  She is now home from SNF.  She can walk about 50 feet now with a walker and is then short of breath.  She is able to do more on her own => can get in and out of bed and can shower.  Mild orthopnea.  No PND, no chest pain.  No longer wearing Unna boots and edema is worse.  No BRBPR/melena. She is wearing CPAP at night, no oxygen during the day. Weight is stable.   Labs (3/17): K 3.5, creatinine 2.26 => 2.03, HCT 25, BNP 133  PMH: 1. Asthma 2. OHS/OSA: CPAP, home oxygen. 3. CHF: Chronic systolic CHF.   - Echo ([id number]) with EF 40-45%, mild aortic stenosis, moderate aortic insufficiency.  4. Aortic valve disorder: Mild aortic stenosis, moderate aortic insufficiency on 2/17 echo. 5. Chronic atrial fibrillation. 6. CKD stage III 7. Lower GI bleeding in 2/17, likely hemorrhoidal.   8. 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    SH: Currently in SNF but normally lives in [place] with nephew.  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  Nonsmoker, no ETOH.   FH: No premature CAD.  ROS: All systems reviewed and negative except as per HPI.   Current Outpatient Prescriptions  Medication Sig Dispense Refill  . albuterol (PROVENTIL HFA;VENTOLIN HFA) 108 (90 Base) MCG/ACT inhaler Inhale 1 puff into the lungs every 6 (six) hours as needed for wheezing or shortness of breath.    Marland Kitchen albuterol (PROVENTIL) (2.5 MG/3ML) 0.083% nebulizer  solution Take 2.5 mg by nebulization every 8 (eight) hours as needed for wheezing.   0  . allopurinol (ZYLOPRIM) 100 MG tablet Take 100 mg by mouth daily.    Marland Kitchen atorvastatin (LIPITOR) 20 MG tablet Take 20 mg by mouth daily.    . benzonatate (TESSALON) 200 MG capsule Take 200 mg by mouth 3 (three) times daily as needed for cough.    . budesonide-formoterol (SYMBICORT) 80-4.5 MCG/ACT inhaler Inhale 2 puffs into the lungs 2 (two) times daily.    . Cholecalciferol (VITAMIN D3) 5000 units CAPS Take 1 capsule by mouth daily.    . cyanocobalamin 500 MCG tablet Take 500 mcg by mouth daily.    . ferrous sulfate 325 (65 FE) MG tablet Take 650 mg by mouth 2 (two) times daily with a meal.    . loratadine (CLARITIN) 10 MG tablet Take 10 mg by mouth daily.    . metoprolol succinate (TOPROL-XL) 25 MG 24 hr tablet Take 1 tablet (25 mg total) by mouth daily. 30 tablet 3  . Multiple Vitamin (MULTIVITAMIN WITH MINERALS) TABS tablet Take 1 tablet by mouth daily. Reported on 07/09/2015    . potassium chloride SA (K-DUR,KLOR-CON) 20 MEQ tablet Take 2 tablets (40 mEq total) by mouth 2 (two) times daily. 120 tablet 6  . rivaroxaban (XARELTO) 20 MG TABS tablet Take 20 mg by mouth daily with supper.    . torsemide (DEMADEX)  20 MG tablet Take 80mg  (4 tablets) in the morning and 60mg  (3 tablets ) in the evening. 210 tablet 6  . vitamin C (ASCORBIC ACID) 500 MG tablet Take 500 mg by mouth daily.     No current facility-administered medications for this encounter.   BP 126/78 mmHg  Pulse 88  Wt 356 lb 12 oz (161.821 kg)  SpO2 97% General: NAD, obese.  Neck: JVP 9-10 cm, no thyromegaly or thyroid nodule.  Lungs: Clear to auscultation bilaterally with normal respiratory effort. CV: Nondisplaced PMI.  Heart irregular S1/S2, no S3/S4, 2/6 SEM RUSB with clear S2.  2+ chronic edema to knees.  No carotid bruit.  Normal pedal pulses.  Abdomen: Soft, nontender, no hepatosplenomegaly, no distention.  Skin: Intact without lesions  or rashes.  Neurologic: Alert and oriented x 3.  Psych: Normal affect. Extremities: No clubbing or cyanosis.  HEENT: Normal.   Assessment/Plan: 1. Chronic systolic CHF: Suspect nonischemic.  EF 40-45% on 2/17 echo.  She remains volume overloaded on exam.  NYHA class IIIb symptoms.  It is possible that frequent RV pacing has worsened her LV function.  - I would like her to go back in [employer]. - Increase torsemide to 80 qam/60 qpm and KCl to 40 bid.  BMET/BNP today and again in 10 days (she can get the followup labs in [place]).  - Increase Toprol XL to 25 mg bid. No ACEIARB with elevated creatinine.  2. Symptomatic bradycardia: MDT PPM.  Interrogated in the hospital, had high percentage RV pacing.  I will have her followup with EP to establish in pacer clinic. Will need to investigate whether we could could cut back on RV pacing at all, alternatively could consider CRT upgrade given EF 40-45% and frequent RV pacing.  3. CKD: Stage III.  Check BMET today and will need to recheck in 10 days on new diuretic regimen.  4. Chronic atrial fibrillation: She continues on Toprol XL and Xarelto. No further BRBPR or melena. Check CBC. 5. OHS/OSA: Continue CPAP at night and oxygen as needed during the day.  She will need pulmonary followup.   [name] 07/19/2015

## 2015-07-19 NOTE — Telephone Encounter (Signed)
Pharmacy called to ask if we could verify  medication and how it is to be taken, Did a check on  Patient and they had been discharged from rehab but had held on to prescription. Pharmacy asked if I could send over the new prescription in place of old to be filled new one  sent.

## 2015-07-19 NOTE — Patient Instructions (Signed)
INCREASE Torsemide to 80 mg (4 tabs) in am and 60 mg (3 tabs) in pm.  INCREASE Potassium to 40 meq (2 tabs) twice daily.  Routine lab work today. Will notify you of abnormal results, otherwise no news is good news!  Have labs repeated in 1 week. Take prescription to nearest lab drawing facility for phlebotomy orders and instructions.  Will refer you to electrophysiology at [facility]. Address: [address] ([place]), [place], [place] [address]  Phone: [phone]  Will have your home health nurse place unna boots.  Follow up with Dr. Aundra Dubin in 1 month.  Do the following things EVERYDAY: 1) Weigh yourself in the morning before breakfast. Write it down and keep it in a log. 2) Take your medicines as prescribed 3) Eat low salt foods-Limit salt (sodium) to 2000 mg per day.  4) Stay as active as you can everyday 5) Limit all fluids for the day to less than 2 liters

## 2015-07-23 ENCOUNTER — Telehealth (HOSPITAL_COMMUNITY): Payer: Self-pay | Admitting: Cardiology

## 2015-07-23 MED ORDER — RIVAROXABAN 15 MG PO TABS: 15.0000 mg | ORAL_TABLET | Freq: Every day | ORAL | Status: DC

## 2015-07-23 NOTE — Telephone Encounter (Signed)
Patient aware and voiced understanding Patient will have [facility] nurse repeat labs at next home visit on 3/30 New rx on xarelto

## 2015-07-23 NOTE — Telephone Encounter (Signed)
-----   Message from Larey Dresser, MD sent at 07/22/2015 10:56 PM EDT ----- Need to repeat CBC early this week with lower hemoglobin.  Decrease Xarelto to 15 daily with low GFR.

## 2015-07-27 ENCOUNTER — Telehealth (HOSPITAL_COMMUNITY): Payer: Self-pay | Admitting: Student

## 2015-07-27 ENCOUNTER — Encounter: Payer: Self-pay | Admitting: Cardiology

## 2015-07-27 NOTE — Telephone Encounter (Signed)
Repeat CBC done 07/26/15 at [facility]  Hgb 7.1 -> 7.8. Decreased Xarelto to 15 mg previously with GFR.   Will need repeat CBC end of next week. Continue to monitor. Will also forward to Dr. Aundra Dubin.    Legrand Como [address] [place], PA-C 07/27/2015 3:56 PM

## 2015-08-02 NOTE — Telephone Encounter (Signed)
Addressed via duplicate electronic lab results.    Legrand Como [address]" [place], PA-C 08/02/2015 3:23 PM

## 2015-08-02 NOTE — Telephone Encounter (Signed)
Can we follow up and get her CBC tomorrow or early next week with HH?     Legrand Como [address]" [place], PA-C 08/02/2015 3:21 PM

## 2015-08-03 ENCOUNTER — Other Ambulatory Visit (HOSPITAL_COMMUNITY): Payer: Self-pay | Admitting: Cardiology

## 2015-08-06 ENCOUNTER — Telehealth (HOSPITAL_COMMUNITY): Payer: Self-pay | Admitting: *Deleted

## 2015-08-06 ENCOUNTER — Other Ambulatory Visit (HOSPITAL_COMMUNITY): Payer: BLUE CROSS/BLUE SHIELD

## 2015-08-06 MED ORDER — METOPROLOL SUCCINATE ER 25 MG PO TB24: 25.0000 mg | ORAL_TABLET | Freq: Two times a day (BID) | ORAL | Status: DC

## 2015-08-06 NOTE — Telephone Encounter (Signed)
Pt called to clarify her Metoprolol dose, she states she was taking it 25 mg daily but at last appt she thought Dr Aundra Dubin told her she could take it Twice daily but her prescription says daily.  Per last OV note: - Increase Toprol XL to 25 mg bid. No ACEIARB with elevated creatinine.  Advised pt should be 25 mg bid will send in new rx.

## 2015-08-10 ENCOUNTER — Encounter: Payer: Self-pay | Admitting: Cardiology

## 2015-08-15 DIAGNOSIS — N189 Chronic kidney disease, unspecified: Secondary | ICD-10-CM | POA: Diagnosis not present

## 2015-08-15 DIAGNOSIS — I5043 Acute on chronic combined systolic (congestive) and diastolic (congestive) heart failure: Secondary | ICD-10-CM | POA: Diagnosis not present

## 2015-08-15 DIAGNOSIS — E1122 Type 2 diabetes mellitus with diabetic chronic kidney disease: Secondary | ICD-10-CM | POA: Diagnosis not present

## 2015-08-15 DIAGNOSIS — I429 Cardiomyopathy, unspecified: Secondary | ICD-10-CM | POA: Diagnosis not present

## 2015-08-16 ENCOUNTER — Telehealth (HOSPITAL_COMMUNITY): Payer: Self-pay | Admitting: Cardiology

## 2015-08-16 NOTE — Telephone Encounter (Signed)
Metolazone 2.5 mg po x 1 today then increase torsemide to 80 mg bid.  BMET 1 week.

## 2015-08-16 NOTE — Telephone Encounter (Signed)
HH called to report 6.3lb weight gain x 1 week    Please advise further if needed

## 2015-08-17 ENCOUNTER — Other Ambulatory Visit (HOSPITAL_COMMUNITY): Payer: Self-pay | Admitting: Cardiology

## 2015-08-21 ENCOUNTER — Telehealth (HOSPITAL_COMMUNITY): Payer: Self-pay | Admitting: Vascular Surgery

## 2015-08-21 NOTE — Telephone Encounter (Signed)
Spoke to pt to get her rescheduled due to DR. Mclean not being in the office on 4/28, her nephew took off work to bring he to this appt, she would need to contact her nephew to see when he could bring her in , pt will call back when she has that info from her nephew

## 2015-08-21 NOTE — Telephone Encounter (Signed)
Kirsten with [facility] aware of orders Also reports weight has returned to baseline + 1 lb Patient asymptomatic   Also order for unna boots discontinued yntil patient is further evaluated at f/u 5/4

## 2015-08-24 ENCOUNTER — Encounter (HOSPITAL_COMMUNITY): Payer: BLUE CROSS/BLUE SHIELD

## 2015-08-29 NOTE — Progress Notes (Signed)
Patient ID: Robyn Porter, female   DOB: 1950-08-21, 65 y.o.   MRN: [id number]    Advanced Heart Failure Clinic Note   PCP: Dr. Marveen Reeks [facility]) Pulmonary: Dr Donovan Kail  Cardiology: Dr. Aundra Dubin  65 yo with history of OHS/OSA, chronic systolic CHF and chronic atrial fibrillation presents for CHF clinic followup.  She was admitted in 2/17 to [facility] with massive volume overload.  She was aggressively diuresed in the hospital and lost around 70 lbs.  She had BRBPR in the hospital, this was thought to be hemorrhoidal bleeding (had colonoscopy).  Echo in 2/17 showed EF 40-45% with mild AS and mild AI.  She was discharged to SNF.  She presents today for regular follow up. At last visit torsemide increased. Sent HH orders several weeks ago to increase torsemide and take a metolazone, but pt states she never received. Remains at home, walking with PT twice a week. Still walking about 60-100 feet prior to getting SOB.  Still has some SOB with ADLs, but is more independent. UNNA boots stopped last Thursday (was only changing once a week with [facility]). No more orthopnea. No PND or CP. Weight at home 354-360. Stool is always black due to Iron.  Did have some loose stools yesterday, but no BRBPR noted.  She wears CPAP at night, no oxygen during the day. Hasn't had any follow up with GI doctor.   Labs (3/17): K 3.5, creatinine 2.26 => 2.03, HCT 25, BNP 133 Labs (07/19/15) K 3.8, creatinine 1.94, BNP 149.0, Hgb 7.1  PMH: 1. Asthma 2. OHS/OSA: CPAP, home oxygen. 3. CHF: Chronic systolic CHF.   - Echo ([id number]) with EF 40-45%, mild aortic stenosis, moderate aortic insufficiency.  4. Aortic valve disorder: Mild aortic stenosis, moderate aortic insufficiency on 2/17 echo. 5. Chronic atrial fibrillation. 6. CKD stage III 7. Lower GI bleeding in 2/17, likely hemorrhoidal.   8. 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    SH: Currently in SNF but normally lives in [place] with nephew.  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  Nonsmoker, no ETOH.   FH: No premature CAD.  ROS: All systems reviewed and negative except as per HPI.   Current Outpatient Prescriptions  Medication Sig Dispense Refill  . albuterol (PROVENTIL HFA;VENTOLIN HFA) 108 (90 Base) MCG/ACT inhaler Inhale 1 puff into the lungs every 6 (six) hours as needed for wheezing or shortness of breath.    Marland Kitchen albuterol (PROVENTIL) (2.5 MG/3ML) 0.083% nebulizer solution Take 2.5 mg by nebulization every 8 (eight) hours as needed for wheezing.   0  . allopurinol (ZYLOPRIM) 100 MG tablet Take 100 mg by mouth daily.    Marland Kitchen atorvastatin (LIPITOR) 20 MG tablet Take 20 mg by mouth daily.    . benzonatate (TESSALON) 200 MG capsule Take 200 mg by mouth 3 (three) times daily as needed for cough.    . budesonide-formoterol (SYMBICORT) 80-4.5 MCG/ACT inhaler Inhale 2 puffs into the lungs 2 (two) times daily.    . Cholecalciferol (VITAMIN D3) 5000 units CAPS Take 1 capsule by mouth daily.    . cyanocobalamin 500 MCG tablet Take 500 mcg by mouth daily.    . Exenatide ER (BYDUREON) 2 MG PEN Inject 2 mg into the skin once a week. Saturday    . ferrous sulfate 325 (65 FE) MG tablet Take 325 mg by mouth 2 (two) times daily with a meal.     . loratadine (CLARITIN) 10 MG tablet Take 10 mg by mouth daily.    Marland Kitchen  metoprolol succinate (TOPROL-XL) 25 MG 24 hr tablet Take 1 tablet (25 mg total) by mouth 2 (two) times daily. 60 tablet 3  . Multiple Vitamin (MULTIVITAMIN WITH MINERALS) TABS tablet Take 1 tablet by mouth daily. Reported on 07/09/2015    . potassium chloride SA (K-DUR,KLOR-CON) 20 MEQ tablet Take 2 tablets (40 mEq total) by mouth 2 (two) times daily. 120 tablet 6  . Rivaroxaban (XARELTO) 15 MG TABS tablet Take 1 tablet (15 mg total) by mouth daily with supper. 30 tablet 6  . torsemide (DEMADEX) 20 MG tablet Take 80mg  (4 tablets) in the morning and 60mg  (3 tablets ) in the evening. 210 tablet 6  . vitamin C (ASCORBIC ACID) 500 MG tablet Take 500 mg by  mouth daily.     No current facility-administered medications for this encounter.   BP 98/62 mmHg  Pulse 78  Wt 354 lb 9.6 oz (160.846 kg)  SpO2 92%   Wt Readings from Last 3 Encounters:  08/30/15 354 lb 9.6 oz (160.846 kg)  07/19/15 356 lb 12 oz (161.821 kg)  07/09/15 355 lb (161.027 kg)     General: NAD, obese.  Neck: JVP difficult to assess, but only appears to be 7-8 cm, no thyromegaly or thyroid nodule.  Lungs: CTAB, normal effort CV: Nondisplaced PMI.  Heart irregular S1/S2, no S3/S4, 2/6 SEM RUSB with clear S2.  2+ chronic edema to knees. Chronic venous stasis changes, L>R.  No carotid bruit.  Normal pedal pulses.  Abdomen: Morbidly Obese, soft, NT, ND, no HSM. No bruits or masses. +BS   Skin: Intact without lesions or rashes.  Neurologic: Alert and oriented x 3.  Psych: Normal affect. Extremities: No clubbing or cyanosis.  HEENT: Normal.   Assessment/Plan: 1. Chronic systolic CHF: Suspect nonischemic.  EF 40-45% on 2/17 echo.  - Volume status relatively stable.  NYHA class IIIb symptoms.  It is possible that frequent RV pacing has worsened her LV function.  - Continue torsemide 80 qam/60 qpm and KCl 40 bid.  Can take 80 mg BID as needed for weight gain, goal weight 354 or less for now.  - ContinueToprol XL 25 mg bid. No ACEIARB with elevated creatinine.  - No room to up-titrate with soft BP 2. Symptomatic bradycardia: MDT PPM.  Interrogated in the hospital, had high percentage RV pacing.  - Establishes with EP in pacer clinic 09/13/15. Will need to investigate whether we could could cut back on RV pacing at all, alternatively could consider CRT upgrade given EF 40-45% and frequent RV pacing.  3. CKD: Stage III.   Check BMET today  4. Chronic atrial fibrillation: She continues on Toprol XL and Xarelto. No further BRBPR. Check CBC today.  5. OHS/OSA: Continue CPAP at night and oxygen as needed during the day.  She will need pulmonary followup.  6. Anemia - Will check CBC  today. Needs GI follow up with [facility] 7. Chronic venous stasis - Pt is frustrated by [employer] and only wants to wear temporarily. - Think this is a large part of her LE edema, with central volume status stable by exam. 8. Morbid obesity - Continue PT - Encouraged to increase activity as able and limit portions.   Reviewed sliding scale diuretics. No up titration with soft BP. Labs today.  Needs GI follow up, wants  who saw her in the hospital. Will provide contact information.  Will follow up in 4-6 weeks with Dr Aundra Dubin.   Satira Mccallum Tillery PA-C 08/30/2015   Total  time spent > 25 minutes, over half that spent discussing the above.

## 2015-08-30 ENCOUNTER — Encounter: Payer: Self-pay | Admitting: Licensed Clinical Social Worker

## 2015-08-30 ENCOUNTER — Ambulatory Visit (HOSPITAL_COMMUNITY)
Admission: RE | Admit: 2015-08-30 | Discharge: 2015-08-30 | Disposition: A | Discharge status: Home / Self Care | Payer: BLUE CROSS/BLUE SHIELD | Source: Ambulatory Visit | Attending: Cardiology | Admitting: Cardiology

## 2015-08-30 VITALS — BP 98/62 | HR 78 | Wt 354.6 lb

## 2015-08-30 DIAGNOSIS — I878 Other specified disorders of veins: Secondary | ICD-10-CM | POA: Insufficient documentation

## 2015-08-30 DIAGNOSIS — Z7901 Long term (current) use of anticoagulants: Secondary | ICD-10-CM | POA: Insufficient documentation

## 2015-08-30 DIAGNOSIS — Z79899 Other long term (current) drug therapy: Secondary | ICD-10-CM | POA: Diagnosis not present

## 2015-08-30 DIAGNOSIS — N183 Chronic kidney disease, stage 3 (moderate): Secondary | ICD-10-CM | POA: Insufficient documentation

## 2015-08-30 DIAGNOSIS — I480 Paroxysmal atrial fibrillation: Secondary | ICD-10-CM | POA: Insufficient documentation

## 2015-08-30 DIAGNOSIS — E662 Morbid (severe) obesity with alveolar hypoventilation: Secondary | ICD-10-CM | POA: Insufficient documentation

## 2015-08-30 DIAGNOSIS — Z950 Presence of cardiac pacemaker: Secondary | ICD-10-CM | POA: Insufficient documentation

## 2015-08-30 DIAGNOSIS — J45909 Unspecified asthma, uncomplicated: Secondary | ICD-10-CM | POA: Diagnosis not present

## 2015-08-30 DIAGNOSIS — I5022 Chronic systolic (congestive) heart failure: Secondary | ICD-10-CM | POA: Diagnosis not present

## 2015-08-30 DIAGNOSIS — G4733 Obstructive sleep apnea (adult) (pediatric): Secondary | ICD-10-CM

## 2015-08-30 DIAGNOSIS — I482 Chronic atrial fibrillation, unspecified: Secondary | ICD-10-CM

## 2015-08-30 DIAGNOSIS — D649 Anemia, unspecified: Secondary | ICD-10-CM | POA: Diagnosis not present

## 2015-08-30 DIAGNOSIS — R001 Bradycardia, unspecified: Secondary | ICD-10-CM | POA: Diagnosis not present

## 2015-08-30 DIAGNOSIS — Z8679 Personal history of other diseases of the circulatory system: Secondary | ICD-10-CM | POA: Diagnosis not present

## 2015-08-30 LAB — BASIC METABOLIC PANEL
Anion gap: 16 — ABNORMAL HIGH (ref 5–15)
BUN: 45 mg/dL — AB (ref 6–20)
CALCIUM: 10 mg/dL (ref 8.9–10.3)
CO2: 25 mmol/L (ref 22–32)
CREATININE: 2.19 mg/dL — AB (ref 0.44–1.00)
Chloride: 102 mmol/L (ref 101–111)
GFR calc Af Amer: 26 mL/min — ABNORMAL LOW (ref >60)
GFR, EST NON AFRICAN AMERICAN: 22 mL/min — AB (ref >60)
GLUCOSE: 93 mg/dL (ref 65–99)
POTASSIUM: 4.1 mmol/L (ref 3.5–5.1)
SODIUM: 143 mmol/L (ref 135–145)

## 2015-08-30 LAB — CBC
HCT: 26.5 % — ABNORMAL LOW (ref 36.0–46.0)
HEMOGLOBIN: 7.7 g/dL — AB (ref 12.0–15.0)
MCH: 28.6 pg (ref 26.0–34.0)
MCHC: 29.1 g/dL — ABNORMAL LOW (ref 30.0–36.0)
MCV: 98.5 fL (ref 78.0–100.0)
Platelets: 176 10*3/uL (ref 150–400)
RBC: 2.69 MIL/uL — ABNORMAL LOW (ref 3.87–5.11)
RDW: 19.1 % — ABNORMAL HIGH (ref 11.5–15.5)
WBC: 7.4 10*3/uL (ref 4.0–10.5)

## 2015-08-30 NOTE — Patient Instructions (Addendum)
Labs today  Your physician recommends that you schedule a follow-up appointment in: 4-6 weeks with Dr.McLean  Please be sure to schedule a follow up with [facility] ASAP  Do the following things EVERYDAY: 1) Weigh yourself in the morning before breakfast. Write it down and keep it in a log. 2) Take your medicines as prescribed 3) Eat low salt foods-Limit salt (sodium) to 2000 mg per day.  4) Stay as active as you can everyday 5) Limit all fluids for the day to less than 2 liters 6)

## 2015-08-30 NOTE — Progress Notes (Signed)
CSW referred to assist patient with navigating the social security/medicare system. Patient is a 65yo female who was working full time until November 2016 when she became ill. Patient reports she has maintained benefits and sick leave from her employer although only good for 6 months and will potentially end in May, 2017. Patient asking about social security benefits and medicare coverage. CSW discussed options and encouraged patient to call Social Security and Medicare to discuss further her benefits. CSW also provided info on SHIIP to get advice on best Medicare D program for prescription benefit. Patient verbalizes understanding of follow up and will call CSW if further needs arise. Raquel Sarna, LCSW [phone]

## 2015-08-30 NOTE — Progress Notes (Signed)
Advanced Heart Failure Medication Review by a Pharmacist  Does the patient  feel that his/her medications are working for him/her?  yes  Has the patient been experiencing any side effects to the medications prescribed?  no  Does the patient measure his/her own blood pressure or blood glucose at home?  yes   Does the patient have any problems obtaining medications due to transportation or finances?   no  Understanding of regimen: good Understanding of indications: good Potential of compliance: good Patient understands to avoid NSAIDs. Patient understands to avoid decongestants.  Issues to address at subsequent visits: None   Pharmacist comments:  Robyn Porter is a pleasant 65 yo F presenting with her friend and a current medication list. She reports good compliance with her regimen. 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 I have referred her to Kennyth Lose, our Education officer, museum, for assistance with this process. She did not have any other medication-related questions or concerns for me at this time.   [name]. [name], PharmD, BCPS, CPP Clinical Pharmacist Pager: [phone] Phone: [phone] 08/30/2015 3:21 PM      Time with patient: 10 minutes Preparation and documentation time: 2 minutes Total time: 12 minutes

## 2015-09-11 ENCOUNTER — Encounter: Payer: Self-pay | Admitting: Cardiology

## 2015-09-13 ENCOUNTER — Encounter: Payer: Self-pay | Admitting: Cardiology

## 2015-09-13 ENCOUNTER — Encounter: Payer: Self-pay | Admitting: *Deleted

## 2015-09-13 ENCOUNTER — Ambulatory Visit (INDEPENDENT_AMBULATORY_CARE_PROVIDER_SITE_OTHER): Payer: BLUE CROSS/BLUE SHIELD | Admitting: Cardiology

## 2015-09-13 VITALS — BP 136/70 | HR 83 | Ht 64.0 in | Wt 354.6 lb

## 2015-09-13 DIAGNOSIS — Z01812 Encounter for preprocedural laboratory examination: Secondary | ICD-10-CM

## 2015-09-13 DIAGNOSIS — I5022 Chronic systolic (congestive) heart failure: Secondary | ICD-10-CM

## 2015-09-13 DIAGNOSIS — Z950 Presence of cardiac pacemaker: Secondary | ICD-10-CM

## 2015-09-13 LAB — CBC WITH DIFFERENTIAL/PLATELET
BASOS PCT: 1 %
Basophils Absolute: 57 cells/uL (ref 0–200)
Eosinophils Absolute: 171 cells/uL (ref 15–500)
Eosinophils Relative: 3 %
HCT: 27.7 % — ABNORMAL LOW (ref 35.0–45.0)
HEMOGLOBIN: 8.3 g/dL — AB (ref 11.7–15.5)
LYMPHS ABS: 855 {cells}/uL (ref 850–3900)
Lymphocytes Relative: 15 %
MCH: 28.5 pg (ref 27.0–33.0)
MCHC: 30 g/dL — AB (ref 32.0–36.0)
MCV: 95.2 fL (ref 80.0–100.0)
MONO ABS: 399 {cells}/uL (ref 200–950)
MPV: 10 fL (ref 7.5–12.5)
Monocytes Relative: 7 %
Neutro Abs: 4218 cells/uL (ref 1500–7800)
Neutrophils Relative %: 74 %
PLATELETS: 185 10*3/uL (ref 140–400)
RBC: 2.91 MIL/uL — AB (ref 3.80–5.10)
RDW: 18.1 % — AB (ref 11.0–15.0)
WBC: 5.7 10*3/uL (ref 3.8–10.8)

## 2015-09-13 LAB — BASIC METABOLIC PANEL
BUN: 52 mg/dL — ABNORMAL HIGH (ref 7–25)
CHLORIDE: 102 mmol/L (ref 98–110)
CO2: 27 mmol/L (ref 20–31)
Calcium: 9.6 mg/dL (ref 8.6–10.4)
Creat: 2.21 mg/dL — ABNORMAL HIGH (ref 0.50–0.99)
Glucose, Bld: 103 mg/dL — ABNORMAL HIGH (ref 65–99)
POTASSIUM: 4.1 mmol/L (ref 3.5–5.3)
SODIUM: 140 mmol/L (ref 135–146)

## 2015-09-13 NOTE — Patient Instructions (Addendum)
Medication Instructions:  Your physician recommends that you continue on your current medications as directed. Please refer to the Current Medication list given to you today.  Labwork: Pre procedure labs today: BMP & CBCD  Testing/Procedures: CRT or cardiac resynchronization therapy is a treatment used to correct heart failure. When you have heart failure your heart is weakened and doesn't pump as well as it should. This therapy may help reduce symptoms and improve the quality of life.  Please see the handout/brochure given to you today to get more information of the different options of therapy.  Follow-Up: Your physician recommends that you schedule a follow-up appointment in: 10-14 days, after your procedure on 09/19/15, with device clinic for a wound check.  Your physician recommends that you schedule a follow-up appointment in: 3 months, after your procedure on 09/19/15, with Dr. Curt Bears  If you need a refill on your cardiac medications before your next appointment, please call your pharmacy.  Thank you for choosing [facility]!!   Trinidad Curet, RN [phone]  Any Other Special Instructions Will Be Listed Below (If Applicable). Biventricular Pacemaker Implantation A pacemaker is a small, lightweight, battery-powered device that is placed (implanted) under the skin in the upper chest. Your health care provider may prescribe a pacemaker for you if your heartbeat is too slow (bradycardia). A biventricular pacemaker is a pulse generator connected by wires called leads that go into the two lower chambers on the right and left sides of your heart (ventricles). It is used to treat symptoms of heart failure. The pulse generator is a small computer run by a battery. The generator creates a regular electronic pulse. The pulse is sent through the leads, which go through a blood vessel and into the ventricles of your heart. This type of pacemaker makes a weak heart more efficient. LET [facility] CARE PROVIDER KNOW ABOUT:  Any allergies. Some allergies can cause serious problems during the procedure. Allergies to shellfish or agents, such as iodine, used in liquids that enhance specific areas of your body on X-ray images (contrast dyes) are especially problematic.   All medicines you take. These include vitamins, herbs, eye drops, over-the-counter medicines, and creams.   Use of steroids.   Problems with numbing medicines (anesthetics).  Bleeding problems.   Past surgeries.   Other health problems. RISKS AND COMPLICATIONS Implanting a biventricular pacemaker is usually a safe procedure but problems can occur. For example:   Too much bleeding may occur.   Infection may develop.   Blood vessels, your lungs, or your heart may be harmed.   The pacemaker may not make your condition better. BEFORE THE PROCEDURE   You may need to have blood tests, heart tests, or a chest X-ray done before the day of the procedure.   Ask your health care provider about changing or stopping your regular medicines.   Make plans to have someone drive you home.  Stop smoking at least 24 hours before the procedure.  Take a bath or shower the night before the procedure. You may need to scrub your chest with a special type of soap.  Do not eat or drink anything after midnight the night before your procedure. Ask if it is okay to take any needed medicine with a small sip of water. PROCEDURE The procedure to put a pacemaker in your chest is usually done at a hospital in a room that has a large X-ray machine called a fluoroscope. The machine will be above you during the  procedure. It will help your health care provider see your heart during the procedure. Before the procedure:   Small monitors will be put on your body. They will be used to check your heart, blood pressure, and oxygen level.  A needle will be put into a vein in your hand or arm. This is called an intravenous (IV)  access tube. Fluids and medicine will flow directly into your body through the IV tube.  Your chest will be cleaned with a germ-killing (antiseptic) solution. Your chest may be shaved.  You may be given medicine to help you relax (sedative).   You will be given a numbing medicine called a local anesthetic. This medicine will make your chest area have no feeling while the pacemaker is implanted. You will be sleepy but awake during the procedure. After you are numb, the procedure will begin. The health care provider will:   Make a small cut (incision). This will make a pocket deep under your skin that will hold the pulse generator.  Guide the leads through a large blood vessel into your heart and attach them to the heart muscles.  Test the pacemaker.  Close the incision with stitches, glue, or staples. AFTER THE PROCEDURE  You may feel pain. Some pain is normal. It may last a few days.   You may stay in a recovery area until the local anesthetic has worn off. Your blood pressure and pulse will be checked often. You will be taken to a room where your heartbeat will be monitored.  A chest X-ray will be taken. This checks that the pacemaker is in the right place.  The pacemaker will be checked before you go home. It can be adjusted if that is needed.   This information is not intended to replace advice given to you by your health care provider. Make sure you discuss any questions you have with your health care provider.   Document Released: 01/07/2012 Document Revised: 05/05/2014 Document Reviewed: 01/07/2012 Elsevier Interactive Patient Education [employer].

## 2015-09-13 NOTE — Progress Notes (Signed)
Electrophysiology Office Note   Date:  09/13/2015   ID:  Robyn Porter, DOB January 28, 1951, MRN [id number]  PCP:  ADDIS,DANIEL, DO  Cardiologist:  Aundra Dubin Primary Electrophysiologist:  Ayshia Gramlich [name], MD    Chief Complaint  Patient presents with  . Pacemaker Check     History of Present Illness: Robyn Porter is a 65 y.o. female who presents today for electrophysiology evaluation.   She has a history of sleep apnea, chronic systolic heart failure, CKG stage III, symptomatic bradycardia status post Medtronic pacemaker and chronic atrial fibrillation. She was admitted to [facility] on 2/17 with volume overload. She was aggressively diuresed in the hospital and lost 70 pounds. She had bright red blood per rectum in the hospital which was thought due to hemorrhoids after a colonoscopy. An echo on 2/17 showed an EF 40-45% mild AF and mild AI. Currently she walks about 60-100 feet without getting short of breath.  Since that time, she has continued to feel weak and fatigued. She rides around in a scooter when she leaves the house. Her niece helps her with most of her chores.   Today, she denies symptoms of chest pain, orthopnea, PND,  claudication, dizziness, presyncope, syncope, bleeding, or neurologic sequela. The patient is tolerating medications without difficulties and is otherwise without complaint today.    Past Medical History  Diagnosis Date  . COPD (chronic obstructive pulmonary disease) ([place])   . Hypertension   . Diabetes mellitus without complication ([place])   . Afib ([place])   . Pacemaker    Past Surgical History  Procedure Laterality Date  . Pacemaker insertion    . Abdominal hysterectomy    . Colonoscopy N/A 06/29/2015    Procedure: COLONOSCOPY;  Surgeon: Irene Shipper, MD;  Location: [place];  Service: Endoscopy;  Laterality: N/A;     Current Outpatient Prescriptions  Medication Sig Dispense Refill  . albuterol (PROVENTIL HFA;VENTOLIN HFA) 108 (90  Base) MCG/ACT inhaler Inhale 1 puff into the lungs every 6 (six) hours as needed for wheezing or shortness of breath.    Marland Kitchen albuterol (PROVENTIL) (2.5 MG/3ML) 0.083% nebulizer solution Take 2.5 mg by nebulization every 8 (eight) hours as needed for wheezing.   0  . allopurinol (ZYLOPRIM) 100 MG tablet Take 100 mg by mouth daily.    Marland Kitchen atorvastatin (LIPITOR) 20 MG tablet Take 20 mg by mouth daily.    . budesonide-formoterol (SYMBICORT) 80-4.5 MCG/ACT inhaler Inhale 2 puffs into the lungs 2 (two) times daily.    . Cholecalciferol (VITAMIN D3) 5000 units CAPS Take 1 capsule by mouth daily.    . cyanocobalamin 500 MCG tablet Take 500 mcg by mouth daily.    . Exenatide ER (BYDUREON) 2 MG PEN Inject 2 mg into the skin once a week. Saturday    . ferrous sulfate 325 (65 FE) MG tablet Take 325 mg by mouth 2 (two) times daily with a meal.     . loratadine (CLARITIN) 10 MG tablet Take 10 mg by mouth daily.    . metoprolol succinate (TOPROL-XL) 25 MG 24 hr tablet Take 1 tablet (25 mg total) by mouth 2 (two) times daily. 60 tablet 3  . Multiple Vitamin (MULTIVITAMIN WITH MINERALS) TABS tablet Take 1 tablet by mouth daily. Reported on 07/09/2015    . potassium chloride SA (K-DUR,KLOR-CON) 20 MEQ tablet Take 2 tablets (40 mEq total) by mouth 2 (two) times daily. 120 tablet 6  . Rivaroxaban (XARELTO) 15 MG TABS tablet Take 1 tablet (15  mg total) by mouth daily with supper. 30 tablet 6  . torsemide (DEMADEX) 20 MG tablet Take 80mg  (4 tablets) in the morning and 60mg  (3 tablets ) in the evening. 210 tablet 6  . vitamin C (ASCORBIC ACID) 500 MG tablet Take 500 mg by mouth daily.     No current facility-administered medications for this visit.    Allergies:   Amoxicillin; Contrast media; Shellfish-derived products; and Benadryl   Social History:  The patient  reports that she has been passively smoking.  She has never used smokeless tobacco. She reports that she does not drink alcohol or use illicit drugs.   Family  History:  The patient's family history includes Angina in her sister; Colon cancer in her brother; Emphysema in her sister.    ROS:  Please see the history of present illness.   Otherwise, review of systems is positive for fatigue, leg pain, palpitations, cough, DOE, wheezing, snoring.   All other systems are reviewed and negative.    PHYSICAL EXAM: VS:  BP 136/70 mmHg  Pulse 83  Ht 5\' 4"  (1.626 m)  Wt 354 lb 9.6 oz (160.846 kg)  BMI 60.84 kg/m2 , BMI Body mass index is 60.84 kg/(m^2). GEN: Well nourished, well developed, in no acute distress HEENT: normal Neck: no JVD, carotid bruits, or masses Cardiac: RRR; no murmurs, rubs, or gallops,no edema  Respiratory:  clear to auscultation bilaterally, normal work of breathing GI: soft, nontender, nondistended, + BS MS: no deformity or atrophy Skin: warm and dry, device site well healed Neuro:  Strength and sensation are intact Psych: euthymic mood, full affect  EKG:  EKG is ordered today. The ekg ordered today shows V paced, atrial fibrillation  Device interrogation is reviewed today in detail.  See PaceArt for details.   Recent Labs: 06/26/2015: Magnesium 1.8 07/19/2015: B Natriuretic Peptide 149.0* 08/30/2015: BUN 45*; Creatinine, Ser 2.19*; Hemoglobin 7.7*; Platelets 176; Potassium 4.1; Sodium 143    Lipid Panel  No results found for: CHOL, TRIG, HDL, CHOLHDL, VLDL, LDLCALC, LDLDIRECT   Wt Readings from Last 3 Encounters:  09/13/15 354 lb 9.6 oz (160.846 kg)  08/30/15 354 lb 9.6 oz (160.846 kg)  07/19/15 356 lb 12 oz (161.821 kg)      Other studies Reviewed: Additional studies/ records that were reviewed today include: TTE 06/14/15  Review of the above records today demonstrates:  - Left ventricle: The cavity size was moderately dilated. Systolic  function was mildly to moderately reduced. The estimated ejection  fraction was in the range of 40% to 45%. Diffuse hypokinesis. - Aortic valve: Trileaflet; moderately  calcified leaflets. Moderate  diffuse thickening and calcification. There was mild stenosis.  There was moderate regurgitation. Valve area (VTI): 1.55 cm^2.  Valve area (Vmax): 1.44 cm^2. Valve area (Vmean): 1.51 cm^2. - Mitral valve: Calcified annulus. There was mild regurgitation. - Pulmonary arteries: PA peak pressure: 47 mm Hg (S).   ASSESSMENT AND PLAN:  1.  Persistent atrial fibrillation: Has had atrial fibrillation for years. She is currently on rivaroxaban for anticoagulation.  This patients CHA2DS2-VASc Score and unadjusted Ischemic Stroke Rate (% per year) is equal to 4.8 % stroke rate/year from a score of 4  Above score calculated as 1 point each if present [CHF, HTN, DM, Vascular=MI/PAD/Aortic Plaque, Age if 65-74, or Female] Above score calculated as 2 points each if present [Age > 75, or Stroke/TIA/TE]  2. Chronic systolic heart failure: Currently he has heart failure and is pacemaker dependent with an RV lead.  I discussed with her the possibility of adding an LV lead to hopefully help out with her heart failure symptoms. Discussed the risks and benefits of the procedure. Risks include bleeding, infection, tamponade, and heart block. I did tell her that the risk of infection is higher going back into the pocket and changing in device. She understands these risks and has agreed with placement of an LV lead.     The patient does not have concerns regarding her medicines.  The following changes were made today:  none  Labs/ tests ordered today include:  No orders of the defined types were placed in this encounter.     Disposition:   FU with Dawanda Mapel 3 months  Signed, Symphani Eckstrom [name], MD  09/13/2015 4:05 PM     [address] [place] [address] [phone] (office) [phone] (fax)

## 2015-09-17 LAB — CUP PACEART INCLINIC DEVICE CHECK
Battery Impedance: 253 Ohm
Battery Remaining Longevity: 101 mo
Battery Voltage: 2.78 V
Brady Statistic RV Percent Paced: 92 %
Date Time Interrogation Session: 20170518235859
Implantable Lead Model: 5076
Lead Channel Impedance Value: 491 Ohm
Lead Channel Setting Pacing Pulse Width: 0.4 ms
Lead Channel Setting Sensing Sensitivity: 2 mV
MDC IDC LEAD IMPLANT DT: 20151022
MDC IDC LEAD LOCATION: 753860
MDC IDC MSMT LEADCHNL RA IMPEDANCE VALUE: 0 Ohm
MDC IDC MSMT LEADCHNL RV PACING THRESHOLD AMPLITUDE: 0.75 V
MDC IDC MSMT LEADCHNL RV PACING THRESHOLD PULSEWIDTH: 0.4 ms
MDC IDC SET LEADCHNL RV PACING AMPLITUDE: 2.5 V

## 2015-09-19 ENCOUNTER — Encounter (HOSPITAL_COMMUNITY)
Admission: RE | Disposition: A | Discharge status: Home / Self Care | Payer: Self-pay | Source: Ambulatory Visit | Attending: Cardiology

## 2015-09-19 ENCOUNTER — Encounter (HOSPITAL_COMMUNITY): Payer: Self-pay | Admitting: General Practice

## 2015-09-19 ENCOUNTER — Ambulatory Visit (HOSPITAL_COMMUNITY)
Admission: RE | Admit: 2015-09-19 | Discharge: 2015-09-21 | Disposition: A | Discharge status: Home / Self Care | Payer: BLUE CROSS/BLUE SHIELD | Source: Ambulatory Visit | Attending: Cardiology | Admitting: Cardiology

## 2015-09-19 DIAGNOSIS — Z91041 Radiographic dye allergy status: Secondary | ICD-10-CM | POA: Insufficient documentation

## 2015-09-19 DIAGNOSIS — G4733 Obstructive sleep apnea (adult) (pediatric): Secondary | ICD-10-CM | POA: Insufficient documentation

## 2015-09-19 DIAGNOSIS — Z79899 Other long term (current) drug therapy: Secondary | ICD-10-CM | POA: Insufficient documentation

## 2015-09-19 DIAGNOSIS — I482 Chronic atrial fibrillation: Secondary | ICD-10-CM | POA: Diagnosis not present

## 2015-09-19 DIAGNOSIS — Z880 Allergy status to penicillin: Secondary | ICD-10-CM | POA: Diagnosis not present

## 2015-09-19 DIAGNOSIS — N183 Chronic kidney disease, stage 3 (moderate): Secondary | ICD-10-CM | POA: Insufficient documentation

## 2015-09-19 DIAGNOSIS — I447 Left bundle-branch block, unspecified: Secondary | ICD-10-CM | POA: Insufficient documentation

## 2015-09-19 DIAGNOSIS — D649 Anemia, unspecified: Secondary | ICD-10-CM | POA: Insufficient documentation

## 2015-09-19 DIAGNOSIS — E785 Hyperlipidemia, unspecified: Secondary | ICD-10-CM | POA: Diagnosis not present

## 2015-09-19 DIAGNOSIS — Z95818 Presence of other cardiac implants and grafts: Secondary | ICD-10-CM

## 2015-09-19 DIAGNOSIS — R001 Bradycardia, unspecified: Secondary | ICD-10-CM | POA: Diagnosis not present

## 2015-09-19 DIAGNOSIS — I429 Cardiomyopathy, unspecified: Secondary | ICD-10-CM | POA: Diagnosis present

## 2015-09-19 DIAGNOSIS — I5022 Chronic systolic (congestive) heart failure: Secondary | ICD-10-CM | POA: Diagnosis not present

## 2015-09-19 DIAGNOSIS — R531 Weakness: Secondary | ICD-10-CM | POA: Insufficient documentation

## 2015-09-19 DIAGNOSIS — Z7901 Long term (current) use of anticoagulants: Secondary | ICD-10-CM | POA: Diagnosis not present

## 2015-09-19 DIAGNOSIS — I5023 Acute on chronic systolic (congestive) heart failure: Secondary | ICD-10-CM | POA: Insufficient documentation

## 2015-09-19 DIAGNOSIS — R262 Difficulty in walking, not elsewhere classified: Secondary | ICD-10-CM | POA: Insufficient documentation

## 2015-09-19 DIAGNOSIS — Z45010 Encounter for checking and testing of cardiac pacemaker pulse generator [battery]: Secondary | ICD-10-CM

## 2015-09-19 HISTORY — PX: EP IMPLANTABLE DEVICE: SHX172B

## 2015-09-19 HISTORY — DX: Heart failure, unspecified: I50.9

## 2015-09-19 HISTORY — PX: BIV PACEMAKER GENERATOR CHANGE OUT: SHX5746

## 2015-09-19 LAB — GLUCOSE, CAPILLARY
Glucose-Capillary: 108 mg/dL — ABNORMAL HIGH (ref 65–99)
Glucose-Capillary: 110 mg/dL — ABNORMAL HIGH (ref 65–99)
Glucose-Capillary: 173 mg/dL — ABNORMAL HIGH (ref 65–99)

## 2015-09-19 LAB — SURGICAL PCR SCREEN
MRSA, PCR: POSITIVE — AB
Staphylococcus aureus: POSITIVE — AB

## 2015-09-19 SURGERY — BIV PACEMAKER INSERTION CRT-P

## 2015-09-19 MED ORDER — FUROSEMIDE 10 MG/ML IJ SOLN
80.0000 mg | Freq: Three times a day (TID) | INTRAMUSCULAR | Status: DC
  Administered 2015-09-19 – 2015-09-20 (×5): 80 mg via INTRAVENOUS
  Filled 2015-09-19 (×6): qty 8

## 2015-09-19 MED ORDER — VITAMIN D3 25 MCG (1000 UNIT) PO TABS
5000.0000 [IU] | ORAL_TABLET | Freq: Every day | ORAL | Status: DC
  Administered 2015-09-19 – 2015-09-21 (×3): 5000 [IU] via ORAL
  Filled 2015-09-19 (×5): qty 5

## 2015-09-19 MED ORDER — ADULT MULTIVITAMIN W/MINERALS CH
1.0000 | ORAL_TABLET | Freq: Every day | ORAL | Status: DC
  Administered 2015-09-19 – 2015-09-21 (×3): 1 via ORAL
  Filled 2015-09-19 (×3): qty 1

## 2015-09-19 MED ORDER — FAMOTIDINE IN NACL 20-0.9 MG/50ML-% IV SOLN
20.0000 mg | Freq: Once | INTRAVENOUS | Status: AC
  Administered 2015-09-19: 20 mg via INTRAVENOUS

## 2015-09-19 MED ORDER — YOU HAVE A PACEMAKER BOOK
Freq: Once | Status: AC
  Administered 2015-09-19: 23:00:00
  Filled 2015-09-19: qty 1

## 2015-09-19 MED ORDER — HEPARIN (PORCINE) IN NACL 2-0.9 UNIT/ML-% IJ SOLN
INTRAMUSCULAR | Status: AC
  Filled 2015-09-19: qty 500

## 2015-09-19 MED ORDER — MIDAZOLAM HCL 5 MG/5ML IJ SOLN
INTRAMUSCULAR | Status: DC | PRN
  Administered 2015-09-19 (×2): 1 mg via INTRAVENOUS

## 2015-09-19 MED ORDER — IOPAMIDOL (ISOVUE-370) INJECTION 76%
INTRAVENOUS | Status: AC
  Filled 2015-09-19: qty 100

## 2015-09-19 MED ORDER — LIDOCAINE HCL (PF) 1 % IJ SOLN
INTRAMUSCULAR | Status: AC
  Filled 2015-09-19: qty 60

## 2015-09-19 MED ORDER — ALBUTEROL SULFATE (2.5 MG/3ML) 0.083% IN NEBU: 2.5000 mg | INHALATION_SOLUTION | Freq: Four times a day (QID) | RESPIRATORY_TRACT | Status: DC | PRN

## 2015-09-19 MED ORDER — FENTANYL CITRATE (PF) 100 MCG/2ML IJ SOLN
INTRAMUSCULAR | Status: AC
  Filled 2015-09-19: qty 2

## 2015-09-19 MED ORDER — INSULIN ASPART 100 UNIT/ML ~~LOC~~ SOLN: 0.0000 [IU] | SUBCUTANEOUS | Status: DC

## 2015-09-19 MED ORDER — IOPAMIDOL (ISOVUE-370) INJECTION 76%
INTRAVENOUS | Status: DC | PRN
  Administered 2015-09-19: 15 mL via INTRAVENOUS
  Administered 2015-09-19: 10 mL via INTRAVENOUS

## 2015-09-19 MED ORDER — FENTANYL CITRATE (PF) 100 MCG/2ML IJ SOLN
INTRAMUSCULAR | Status: DC | PRN
  Administered 2015-09-19 (×2): 25 ug via INTRAVENOUS

## 2015-09-19 MED ORDER — LIDOCAINE HCL (PF) 1 % IJ SOLN
INTRAMUSCULAR | Status: DC | PRN
  Administered 2015-09-19: 46 mL via INTRADERMAL

## 2015-09-19 MED ORDER — MIDAZOLAM HCL 5 MG/5ML IJ SOLN
INTRAMUSCULAR | Status: AC
  Filled 2015-09-19: qty 5

## 2015-09-19 MED ORDER — VANCOMYCIN HCL 10 G IV SOLR
1500.0000 mg | INTRAVENOUS | Status: AC
  Administered 2015-09-19: 1500 mg via INTRAVENOUS
  Filled 2015-09-19: qty 1500

## 2015-09-19 MED ORDER — ALBUTEROL SULFATE (2.5 MG/3ML) 0.083% IN NEBU: 2.5000 mg | INHALATION_SOLUTION | Freq: Three times a day (TID) | RESPIRATORY_TRACT | Status: DC | PRN

## 2015-09-19 MED ORDER — VITAMIN C 500 MG PO TABS
500.0000 mg | ORAL_TABLET | Freq: Every day | ORAL | Status: DC
  Administered 2015-09-19 – 2015-09-21 (×3): 500 mg via ORAL
  Filled 2015-09-19 (×3): qty 1

## 2015-09-19 MED ORDER — VITAMIN B-12 1000 MCG PO TABS
500.0000 ug | ORAL_TABLET | Freq: Every day | ORAL | Status: DC
  Administered 2015-09-19 – 2015-09-21 (×3): 500 ug via ORAL
  Filled 2015-09-19 (×3): qty 1

## 2015-09-19 MED ORDER — SODIUM CHLORIDE 0.9 % IV SOLN
INTRAVENOUS | Status: DC
  Administered 2015-09-19: 08:00:00 via INTRAVENOUS

## 2015-09-19 MED ORDER — ALLOPURINOL 100 MG PO TABS
100.0000 mg | ORAL_TABLET | Freq: Every day | ORAL | Status: DC
  Administered 2015-09-19 – 2015-09-21 (×3): 100 mg via ORAL
  Filled 2015-09-19 (×3): qty 1

## 2015-09-19 MED ORDER — HEPARIN (PORCINE) IN NACL 2-0.9 UNIT/ML-% IJ SOLN
INTRAMUSCULAR | Status: DC | PRN
  Administered 2015-09-19: 10:00:00

## 2015-09-19 MED ORDER — POTASSIUM CHLORIDE CRYS ER 20 MEQ PO TBCR
40.0000 meq | EXTENDED_RELEASE_TABLET | Freq: Two times a day (BID) | ORAL | Status: DC
  Administered 2015-09-19 – 2015-09-21 (×5): 40 meq via ORAL
  Filled 2015-09-19 (×5): qty 2

## 2015-09-19 MED ORDER — ATORVASTATIN CALCIUM 20 MG PO TABS
20.0000 mg | ORAL_TABLET | Freq: Every day | ORAL | Status: DC
  Administered 2015-09-19 – 2015-09-21 (×3): 20 mg via ORAL
  Filled 2015-09-19 (×3): qty 1

## 2015-09-19 MED ORDER — FLUTICASONE FUROATE-VILANTEROL 100-25 MCG/INH IN AEPB
1.0000 | INHALATION_SPRAY | Freq: Every day | RESPIRATORY_TRACT | Status: DC
  Filled 2015-09-19: qty 28

## 2015-09-19 MED ORDER — FAMOTIDINE IN NACL 20-0.9 MG/50ML-% IV SOLN
INTRAVENOUS | Status: AC
  Filled 2015-09-19: qty 50

## 2015-09-19 MED ORDER — ACETAMINOPHEN 325 MG PO TABS
325.0000 mg | ORAL_TABLET | ORAL | Status: DC | PRN
  Administered 2015-09-20 – 2015-09-21 (×3): 650 mg via ORAL
  Filled 2015-09-19 (×4): qty 2

## 2015-09-19 MED ORDER — TORSEMIDE 20 MG PO TABS: 80.0000 mg | ORAL_TABLET | Freq: Two times a day (BID) | ORAL | Status: DC

## 2015-09-19 MED ORDER — POTASSIUM CHLORIDE CRYS ER 20 MEQ PO TBCR: 40.0000 meq | EXTENDED_RELEASE_TABLET | Freq: Two times a day (BID) | ORAL | Status: DC

## 2015-09-19 MED ORDER — METHYLPREDNISOLONE SODIUM SUCC 125 MG IJ SOLR
125.0000 mg | Freq: Once | INTRAMUSCULAR | Status: AC
  Administered 2015-09-19: 125 mg via INTRAVENOUS

## 2015-09-19 MED ORDER — METOPROLOL SUCCINATE ER 25 MG PO TB24
25.0000 mg | ORAL_TABLET | Freq: Two times a day (BID) | ORAL | Status: DC
  Administered 2015-09-19 – 2015-09-21 (×4): 25 mg via ORAL
  Filled 2015-09-19 (×6): qty 1

## 2015-09-19 MED ORDER — SODIUM CHLORIDE 0.9 % IR SOLN
Status: AC
  Filled 2015-09-19: qty 2

## 2015-09-19 MED ORDER — EXENATIDE ER 2 MG ~~LOC~~ PEN: 2.0000 mg | PEN_INJECTOR | SUBCUTANEOUS | Status: DC

## 2015-09-19 MED ORDER — SODIUM CHLORIDE 0.9 % IR SOLN
80.0000 mg | Status: AC
  Administered 2015-09-19: 80 mg

## 2015-09-19 MED ORDER — MUPIROCIN 2 % EX OINT: 1.0000 "application " | TOPICAL_OINTMENT | Freq: Once | CUTANEOUS | Status: DC

## 2015-09-19 MED ORDER — ONDANSETRON HCL 4 MG/2ML IJ SOLN: 4.0000 mg | Freq: Four times a day (QID) | INTRAMUSCULAR | Status: DC | PRN

## 2015-09-19 MED ORDER — FERROUS SULFATE 325 (65 FE) MG PO TABS
325.0000 mg | ORAL_TABLET | Freq: Two times a day (BID) | ORAL | Status: DC
  Administered 2015-09-19 – 2015-09-21 (×4): 325 mg via ORAL
  Filled 2015-09-19 (×4): qty 1

## 2015-09-19 MED ORDER — INSULIN ASPART 100 UNIT/ML ~~LOC~~ SOLN
0.0000 [IU] | Freq: Three times a day (TID) | SUBCUTANEOUS | Status: DC
  Administered 2015-09-19: 23:00:00 4 [IU] via SUBCUTANEOUS
  Administered 2015-09-20 (×3): 2 [IU] via SUBCUTANEOUS

## 2015-09-19 MED ORDER — RIVAROXABAN 15 MG PO TABS
15.0000 mg | ORAL_TABLET | Freq: Every day | ORAL | Status: DC
  Administered 2015-09-19 – 2015-09-20 (×2): 15 mg via ORAL
  Filled 2015-09-19 (×3): qty 1

## 2015-09-19 MED ORDER — LORATADINE 10 MG PO TABS
10.0000 mg | ORAL_TABLET | Freq: Every day | ORAL | Status: DC
  Administered 2015-09-19 – 2015-09-21 (×4): 10 mg via ORAL
  Filled 2015-09-19 (×3): qty 1

## 2015-09-19 MED ORDER — VANCOMYCIN HCL IN DEXTROSE 1-5 GM/200ML-% IV SOLN
1000.0000 mg | INTRAVENOUS | Status: AC
  Administered 2015-09-20: 10:00:00 1000 mg via INTRAVENOUS
  Filled 2015-09-19: qty 200

## 2015-09-19 MED ORDER — MUPIROCIN 2 % EX OINT
TOPICAL_OINTMENT | CUTANEOUS | Status: AC
  Administered 2015-09-19: 1 via NASAL
  Filled 2015-09-19: qty 22

## 2015-09-19 MED ORDER — METHYLPREDNISOLONE SODIUM SUCC 125 MG IJ SOLR
INTRAMUSCULAR | Status: AC
  Filled 2015-09-19: qty 2

## 2015-09-19 SURGICAL SUPPLY — 15 items
ALLURE CRT PM3262 (Pacemaker) ×3 IMPLANT
CABLE SURGICAL S-101-97-12 (CABLE) ×3 IMPLANT
CATH CPS DIRECT 135 DS2C020 (CATHETERS) ×3 IMPLANT
DEVICE DISSECT PLASMABLAD 3.0S (MISCELLANEOUS) ×1 IMPLANT
HOVERMATT SINGLE USE (MISCELLANEOUS) ×3 IMPLANT
KIT ESSENTIALS PG (KITS) ×3 IMPLANT
LEAD QUARTET 1458Q-86CM (Lead) ×3 IMPLANT
PACEMAKER ALLURE CRT (Pacemaker) ×1 IMPLANT
PAD DEFIB LIFELINK (PAD) ×3 IMPLANT
PLASMABLADE 3.0S (MISCELLANEOUS) ×3
SHEATH CLASSIC 9.5F (SHEATH) ×3 IMPLANT
SLITTER UNIVERSAL DS2A003 (MISCELLANEOUS) ×3 IMPLANT
TRAY PACEMAKER INSERTION (PACKS) ×3 IMPLANT
WIRE ACUITY WHISPER EDS 4648 (WIRE) ×3 IMPLANT
WIRE HI TORQ VERSACORE-J 145CM (WIRE) ×3 IMPLANT

## 2015-09-19 NOTE — H&P (Signed)
Robyn Porter presents to the hospital with a history of heart failure and atrial fibrillation.  She has a single lead pacemaker due to complete heart block.  She has an EF of 40-45% and has had multiple admissions for CHF in the past.  Due to that, she presents for upgrade to a CRT-P.  Risks and benefits explained.  Risks include bleeding, infection, tamponade, pneumothorax.  She understands the risks and has agreed to the pacemaker.  Areil Ottey Curt Bears, MD 09/19/2015 7:32 AM

## 2015-09-19 NOTE — Consult Note (Signed)
ADVANCED HF CONSULT NOTE  Patient ID: Robyn Porter MRN: [id number] DOB/AGE: 1950/08/16 65 y.o.  Admit date: 09/19/2015  Requesting Physician: Dr. Curt Bears Primary Cardiologist: Dr. Aundra Dubin  Reason for Consultation: CHF  HPI: 65 yo with history of OHS/OSA, chronic systolic CHF and chronic atrial fibrillation is seen after she had LV lead placement for CRT upgrade. She was initially admitted in 2/17 to [facility] with massive volume overload. She was aggressively diuresed in the hospital and lost around 70 lbs. She had BRBPR in the hospital, this was thought to be hemorrhoidal bleeding (had colonoscopy). Echo in 2/17 showed EF 40-45% with mild AS and mild AI. She was discharged to SNF.  She was pacemaker dependent and was chronically RV pacing. She saw Dr Curt Bears, and given low EF, he elected to upgrade her device to CRT.  This was done today, now she has a [employer] CRT device.  During procedure, she was noted to have very high venous pressures (pulsatile flow from central veins).  We were asked to assess for assistance with volume management.   She says that her weight has been fluctuating a few pounds up and down at home. She has been taking torsemide 80 qam/60 qpm.  Creatinine has been stable in the 2.2 range (known CKD).   At home, she is short of breath walking short distances.  This has been chronic, however.  No chest pain, lightheadedness.   Review of systems complete and found to be negative unless listed above in HPI  Past Medical History: 1. Asthma 2. OHS/OSA: CPAP, home oxygen. 3. CHF: Chronic systolic CHF. Suspect nonischemic cardiomyopathy.  - Echo (2/17) with EF 40-45%, mild aortic stenosis, moderate aortic insufficiency.  4. Aortic valve disorder: Mild aortic stenosis, moderate aortic insufficiency on 2/17 echo. 5. Chronic atrial fibrillation. 6. CKD stage III 7. Lower GI bleeding in 2/17, likely hemorrhoidal.  8. Symptomatic bradycardia: Upgraded to [facility] Jude CRT  on 09/19/15.   Family History  Problem Relation Age of Onset  . Angina Sister   . Emphysema Sister   . Colon cancer Brother     Social History   Social History  . Marital Status: Single    Spouse Name: N/A  . Number of Children: N/A  . Years of Education: N/A   Occupational History  . Not on file.   Social History Main Topics  . Smoking status: Passive Smoke Exposure - Never Smoker  . Smokeless tobacco: Never Used  . Alcohol Use: No  . Drug Use: No  . Sexual Activity: Not on file   Other Topics Concern  . Not on file   Social History Narrative     Prescriptions prior to admission  Medication Sig Dispense Refill Last Dose  . albuterol (PROVENTIL HFA;VENTOLIN HFA) 108 (90 Base) MCG/ACT inhaler Inhale 1 puff into the lungs every 6 (six) hours as needed for wheezing or shortness of breath.   Past Month  . albuterol (PROVENTIL) (2.5 MG/3ML) 0.083% nebulizer solution Take 2.5 mg by nebulization every 8 (eight) hours as needed for wheezing.   0 09/18/2015 at 2200  . allopurinol (ZYLOPRIM) 100 MG tablet Take 100 mg by mouth daily.   09/18/2015 at 0700  . atorvastatin (LIPITOR) 20 MG tablet Take 20 mg by mouth daily.   09/18/2015 at 2200  . budesonide-formoterol (SYMBICORT) 80-4.5 MCG/ACT inhaler Inhale 2 puffs into the lungs 2 (two) times daily.   09/18/2015 at 2200  . Cholecalciferol (VITAMIN D3) 5000 units CAPS Take 1  capsule by mouth daily.   09/18/2015 at 0700  . cyanocobalamin 500 MCG tablet Take 500 mcg by mouth daily.   Taking  . Exenatide ER (BYDUREON) 2 MG PEN Inject 2 mg into the skin once a week. Saturday   09/15/2015 at Unknown time  . ferrous sulfate 325 (65 FE) MG tablet Take 325 mg by mouth 2 (two) times daily with a meal.    09/18/2015 at 2200  . loratadine (CLARITIN) 10 MG tablet Take 10 mg by mouth daily.   09/18/2015 at 0700  . metoprolol succinate (TOPROL-XL) 25 MG 24 hr tablet Take 1 tablet (25 mg total) by mouth 2 (two) times daily. 60 tablet 3 09/18/2015 at 2200  .  Multiple Vitamin (MULTIVITAMIN WITH MINERALS) TABS tablet Take 1 tablet by mouth daily. Reported on 07/09/2015   09/18/2015 at 0700  . potassium chloride SA (K-DUR,KLOR-CON) 20 MEQ tablet Take 2 tablets (40 mEq total) by mouth 2 (two) times daily. 120 tablet 6 09/18/2015 at 2200  . Rivaroxaban (XARELTO) 15 MG TABS tablet Take 1 tablet (15 mg total) by mouth daily with supper. 30 tablet 6 09/18/2015 at 2200  . torsemide (DEMADEX) 20 MG tablet Take 80mg  (4 tablets) in the morning and 60mg  (3 tablets ) in the evening. 210 tablet 6 09/18/2015 at 2200  . vitamin C (ASCORBIC ACID) 500 MG tablet Take 500 mg by mouth daily.   09/18/2015 at 0700    Physical exam Blood pressure 119/40, pulse 0, temperature 97.4 F (36.3 C), resp. rate 20, SpO2 92 %. General: NAD, obese Neck: JVP 14 cm, no thyromegaly or thyroid nodule.  Lungs: Clear to auscultation bilaterally with normal respiratory effort. CV: Unable to palpate PMI.  Heart regular S1/S2, no S3/S4, 2/6 early SEM RUSB.  1+ woody edema 1/2 to knees bilaterally.  No carotid bruit.  Difficult to palpate pedal pulses.  Abdomen: Soft, nontender, no hepatosplenomegaly, no distention.  Skin: Intact without lesions or rashes.  Neurologic: Alert and oriented x 3.  Psych: Normal affect. Extremities: No clubbing or cyanosis.  HEENT: Normal.   Labs:   Lab Results  Component Value Date   WBC 5.7 09/13/2015   HGB 8.3* 09/13/2015   HCT 27.7* 09/13/2015   MCV 95.2 09/13/2015   PLT 185 09/13/2015    Recent Labs Lab 09/13/15 1646  NA 140  K 4.1  CL 102  CO2 27  BUN 52*  CREATININE 2.21*  CALCIUM 9.6  GLUCOSE 103*   Lab Results  Component Value Date   TROPONINI 0.03 06/13/2015   No results found for: CHOL No results found for: HDL No results found for: LDLCALC No results found for: TRIG No results found for: CHOLHDL No results found for: LDLDIRECT    ASSESSMENT AND PLAN: 65 yo with history of OHS/OSA, chronic systolic CHF and chronic atrial  fibrillation is seen after she had LV lead placement for CRT upgrade.  We were asked to assess for assistance with CHF management.  1. Acute on chronic systolic CHF:  Suspect nonischemic cardiomyopathy.  EF 40-45% on last echo in 2/17.  She had CRT upgrade of her pacemaker today given chronic RV pacing and decreased EF. On exam, she is quite volume overloaded with elevated JVP.  She was noted to have elevated venous pressures during procedure.  - Hold po torsemide, will treat with Lasix 80 mg IV every 8 hrs, 1st dose now.  Will give KCl 40 bid.  - Continue home Toprol XL.  - Stop IV  fluid.   - She has not been on ACE/ARB/ARNI due to elevated creatinine.  2. Bradycardia: She has been pacer dependent, now s/p CRT upgrade to avoid lone RV pacing.  3. Atrial fibrillation: Chronic, x years.  Continue on Xarelto 15 mg daily.  4 .OHS/OSA: She uses CPAP with oxygen at night.  5. Anemia: Chronic.  No overt bleeding.   Signed: [name] 09/19/2015 1:31 PM

## 2015-09-20 ENCOUNTER — Ambulatory Visit (HOSPITAL_COMMUNITY): Payer: BLUE CROSS/BLUE SHIELD

## 2015-09-20 DIAGNOSIS — I482 Chronic atrial fibrillation: Secondary | ICD-10-CM | POA: Diagnosis not present

## 2015-09-20 DIAGNOSIS — I447 Left bundle-branch block, unspecified: Secondary | ICD-10-CM | POA: Diagnosis not present

## 2015-09-20 DIAGNOSIS — I429 Cardiomyopathy, unspecified: Secondary | ICD-10-CM | POA: Diagnosis not present

## 2015-09-20 DIAGNOSIS — Z45010 Encounter for checking and testing of cardiac pacemaker pulse generator [battery]: Secondary | ICD-10-CM | POA: Diagnosis not present

## 2015-09-20 DIAGNOSIS — I5023 Acute on chronic systolic (congestive) heart failure: Secondary | ICD-10-CM | POA: Insufficient documentation

## 2015-09-20 DIAGNOSIS — I5022 Chronic systolic (congestive) heart failure: Secondary | ICD-10-CM | POA: Diagnosis not present

## 2015-09-20 LAB — CBC
HEMATOCRIT: 26.8 % — AB (ref 36.0–46.0)
Hemoglobin: 8 g/dL — ABNORMAL LOW (ref 12.0–15.0)
MCH: 29.4 pg (ref 26.0–34.0)
MCHC: 29.9 g/dL — ABNORMAL LOW (ref 30.0–36.0)
MCV: 98.5 fL (ref 78.0–100.0)
Platelets: 129 10*3/uL — ABNORMAL LOW (ref 150–400)
RBC: 2.72 MIL/uL — AB (ref 3.87–5.11)
RDW: 18.5 % — ABNORMAL HIGH (ref 11.5–15.5)
WBC: 4.9 10*3/uL (ref 4.0–10.5)

## 2015-09-20 LAB — BASIC METABOLIC PANEL
Anion gap: 9 (ref 5–15)
BUN: 52 mg/dL — AB (ref 6–20)
CHLORIDE: 106 mmol/L (ref 101–111)
CO2: 25 mmol/L (ref 22–32)
CREATININE: 2.2 mg/dL — AB (ref 0.44–1.00)
Calcium: 9.6 mg/dL (ref 8.9–10.3)
GFR calc Af Amer: 26 mL/min — ABNORMAL LOW (ref >60)
GFR calc non Af Amer: 22 mL/min — ABNORMAL LOW (ref >60)
Glucose, Bld: 139 mg/dL — ABNORMAL HIGH (ref 65–99)
POTASSIUM: 4.5 mmol/L (ref 3.5–5.1)
SODIUM: 140 mmol/L (ref 135–145)

## 2015-09-20 LAB — GLUCOSE, CAPILLARY
GLUCOSE-CAPILLARY: 125 mg/dL — AB (ref 65–99)
GLUCOSE-CAPILLARY: 135 mg/dL — AB (ref 65–99)
Glucose-Capillary: 131 mg/dL — ABNORMAL HIGH (ref 65–99)
Glucose-Capillary: 115 mg/dL — ABNORMAL HIGH (ref 65–99)

## 2015-09-20 MED ORDER — METOLAZONE 2.5 MG PO TABS
2.5000 mg | ORAL_TABLET | Freq: Once | ORAL | Status: AC
  Administered 2015-09-20: 14:00:00 2.5 mg via ORAL
  Filled 2015-09-20: qty 1

## 2015-09-20 MED ORDER — MOMETASONE FURO-FORMOTEROL FUM 100-5 MCG/ACT IN AERO
2.0000 | INHALATION_SPRAY | Freq: Two times a day (BID) | RESPIRATORY_TRACT | Status: DC
  Administered 2015-09-20 – 2015-09-21 (×3): 2 via RESPIRATORY_TRACT
  Filled 2015-09-20: qty 8.8

## 2015-09-20 MED ORDER — CHLORHEXIDINE GLUCONATE CLOTH 2 % EX PADS: 6.0000 | MEDICATED_PAD | Freq: Every day | CUTANEOUS | Status: DC

## 2015-09-20 MED ORDER — MUPIROCIN 2 % EX OINT
1.0000 "application " | TOPICAL_OINTMENT | Freq: Two times a day (BID) | CUTANEOUS | Status: DC
  Administered 2015-09-20 – 2015-09-21 (×3): 1 via NASAL
  Filled 2015-09-20: qty 22

## 2015-09-20 NOTE — Progress Notes (Signed)
SUBJECTIVE: The patient is doing well today.  At this time, she denies chest pain, shortness of breath, or any new concerns.  Marland Kitchen allopurinol  100 mg Oral Daily  . atorvastatin  20 mg Oral Q supper  . cholecalciferol  5,000 Units Oral Daily  . Exenatide ER  2 mg Subcutaneous Weekly  . ferrous sulfate  325 mg Oral BID WC  . furosemide  80 mg Intravenous Q8H  . insulin aspart  0-24 Units Subcutaneous TID AC & HS  . loratadine  10 mg Oral Daily  . metolazone  2.5 mg Oral Once  . metoprolol succinate  25 mg Oral BID  . mometasone-formoterol  2 puff Inhalation BID  . multivitamin with minerals  1 tablet Oral Daily  . mupirocin ointment  1 application Topical Once  . potassium chloride SA  40 mEq Oral BID  . Rivaroxaban  15 mg Oral Q supper  . vancomycin  1,000 mg Intravenous Q24H  . cyanocobalamin  500 mcg Oral Daily  . vitamin C  500 mg Oral Daily      OBJECTIVE: Physical Exam: Filed Vitals:   09/19/15 1942 09/19/15 2024 09/20/15 0500 09/20/15 0755  BP: 109/39  122/44 112/49  Pulse: 68 70 64 72  Temp: 97.8 F (36.6 C)  97.5 F (36.4 C) 97.8 F (36.6 C)  TempSrc: Oral  Oral Oral  Resp: 17 18 16 15   Weight:   354 lb 15.1 oz (161 kg)   SpO2: 94% 97% 99% 99%    Intake/Output Summary (Last 24 hours) at 09/20/15 0905 Last data filed at 09/20/15 [id number]  Gross per 24 hour  Intake    360 ml  Output   2725 ml  Net  -2365 ml    Telemetry reveals V paced rhythm  GEN- The patient is well appearing, in NAD, alert and oriented x 3 today.   Head- normocephalic, atraumatic Eyes-  Sclera clear, conjunctiva pink Ears- hearing intact Oropharynx- clear Neck- supple, neck is obese Lungs- Clear to ausculation bilaterally, normal work of breathing Heart- Regular rate and rhythm (paced), 1/6SM, no rubs or gallops GI- soft, NT, ND Extremities- no clubbing, cyanosis, no pitting edema Skin- no rash or lesion Psych- euthymic mood, full affect Neuro- no gross deficits  appreciated  LABS: Basic Metabolic Panel:  Recent Labs  09/20/15 0522  NA 140  K 4.5  CL 106  CO2 25  GLUCOSE 139*  BUN 52*  CREATININE 2.20*  CALCIUM 9.6   Liver Function Tests: No results for input(s): AST, ALT, ALKPHOS, BILITOT, PROT, ALBUMIN in the last 72 hours. No results for input(s): LIPASE, AMYLASE in the last 72 hours. CBC:  Recent Labs  09/20/15 0522  WBC 4.9  HGB 8.0*  HCT 26.8*  MCV 98.5  PLT 129*    RADIOLOGY: Dg Chest 2 View 09/20/2015  CLINICAL DATA:  Cardiac device in situ EXAM: CHEST  2 VIEW COMPARISON:  06/15/2015 FINDINGS: Borderline cardiomegaly again noted. No acute infiltrate or pulmonary edema. There is 2 leads cardiac pacemaker with left subclavian approach with leads in right ventricle and left coronary sinus. No infiltrate or pulmonary edema. Suboptimal study due to patient's large body habitus. Mild degenerative changes thoracic spine. IMPRESSION: No active disease. Dual lead cardiac pacemaker in place. Borderline cardiomegaly again noted. No pneumothorax. Electronically Signed   By: Lahoma Crocker M.D.   On: 09/20/2015 08:03    ASSESSMENT AND PLAN:   1. PPM upgrade to CRT-P yesterday with Dr. Curt Bears  Site has some minimal ecchymosis, no hematoma     CXR lead position is stable, no pneumothorax     Device interrogation this morning with intact function     Wound check and f/u have been arranged  2. NICM, CHF     Acute, chronic CHF fluid OL     Fluid - 2365     Continue with CHF/primary cardiologist  3. Permanent Afib     CHA2DS2Vasc is at least 3 on xarelto  4. CRI, stage III     Creat stable from pre-procedure  CHF team Quirino Kakos continue to follow, EP service Shalamar Plourde sign off, but remain available if needed.  Tommye Standard, PA-C 09/20/2015 9:05 AM   I have seen and examined this patient with Tommye Standard.  Agree with above, note added to reflect my findings.  On exam, regular rhythm, no murmurs, lungs clear.  Had CRTP upgrade yesterday  with addition of LV lead.  Tolerated procedure well.  CXR and interrogation this AM without abnormality.  Follow up in device clinic in 10 days for wound check.    Ekin Pilar M. Trelyn Vanderlinde MD 09/20/2015 12:58 PM

## 2015-09-20 NOTE — Care Management Note (Addendum)
Case Management Note  Patient Details  Name: Robyn Porter MRN: [id number] Date of Birth: [birth year]  Subjective/Objective:  Patient is from home , already on xarelto previously, NCM will cont to follow for dc needs.  Patient states she just had [place] services with [facility], for [place] but they signed off.  She now needs HHPT again per pt eval.  Patient states she would like to continue with them for hHPT.  Orders are for [facility] and [facility]. Referral made to [facility] they state soc will be next week and patient states that is fine with her.  NCM will fax information to [phone].  Information faxed to [facility] at 1104 am.  Patient states she will have Medicare at the beginning of next month and bcbs will end at the end of month.  Her soc sec is [phone].                 Action/Plan:   Expected Discharge Date:                  Expected Discharge Plan:  Home/Self Care  In-House Referral:     Discharge planning Services  CM Consult  Post Acute Care Choice:    Choice offered to:     DME Arranged:    DME Agency:     HH Arranged:    [place] Agency:     Status of Service:  Completed, signed off  Medicare Important Message Given:    Date Medicare IM Given:    Medicare IM give by:    Date Additional Medicare IM Given:    Additional Medicare Important Message give by:     If discussed at [place] of Stay Meetings, dates discussed:    Additional Comments:  Zenon Mayo, RN 09/20/2015, 10:46 AM

## 2015-09-20 NOTE — Discharge Summary (Signed)
DISCHARGE SUMMARY    Patient ID: Robyn Porter,  MRN: [id number], DOB/AGE: [birth year] 65 y.o.  Admit date: 09/19/2015 Discharge date: 09/21/2015   Primary Care Physician: ADDIS,DANIEL, DO Primary Cardiologist: Dr. Aundra Dubin Electrophysiologist: Dr. Curt Bears  Primary Discharge Diagnosis:  1. NICM 2. Acute/chronic CHF (systolic)  Secondary Discharge Diagnosis:  1. CRI (stage III) 2. Permanent AFib     CHA2DS2Vasc is at least 3 on Xarelto 3. Morbid obesity4/ OSA uses CPAP 4. Anemia 5. Chronic venous stasis 6. AVBlock with PPM  Allergies  Allergen Reactions  . Amoxicillin Hives  . Contrast Media [Iodinated Diagnostic Agents] Hives  . Shellfish-Derived Products   . Benadryl [Diphenhydramine Hcl] Rash     Procedures This Admission:  1.  Upgrade to a CRT-P on 09/19/15 by Dr Curt Bears.  The patient received aSt. Jude model Quadra Allure MP RF [id number] (serial Number [id number] ) device and a  St. Jude Quartet [id number] (serial number [id number] ) LV lead.   There were no immediate post procedure complications. 2.  CXR on 09/20/15 demonstrated no pneumothorax status post device implantation.   Brief HPI:  Robyn Porter is a 65 y.o. female was referred to electrophysiology in the outpatient setting for consideration of ICD implantation.  Past medical history includes NICM, CHF, CRI, AFib, OSA, obesity, venous stasis.  The patient has reduced EF and AVBlock, with significant RV pacing and was decided she may benefit from upgrade of her PPM to CRT-P.  Risks, benefits, and alternatives to the procedure were reviewed with the patient who wished to proceed.   Hospital Course:  The patient was admitted and underwent upgrade of her PPM to CRT-P with details as outlined above. She was observed during the case to have significantly increased venous pressure and HF team was consulted to assume care for IV diuresis.   Weight had been fluctuating at home on torsemide 80/60 mg daily at home.  She has  SOB with short distances at her baseline.   Started on IV lasix 80 mg TID with potassium supplementation.   She diuresed moderately well overnight and was continued on this regimen for one more day with continued overload.  Overall she diuresed 5.5 L, although weights were inaccurate. She improved symptomatically and was thought to be stable for discharge.  Home diuretic regimen increase to 80 mg torsemide BID.     She will discharged to home in stable condition with close follow up as below.   Discharge Weight: 363 (Likely inaccurate, shows up 9 lbs despite brisk diuresis and improvement on exam)  The patient's discharge medications include an ACE-I (none given CRI) and beta blocker (metoprolol).   Physical Exam: Filed Vitals:   09/20/15 2047 09/20/15 2053 09/21/15 0435 09/21/15 0914  BP:   120/47 121/43  Pulse:  65 64 67  Temp:   96.9 F (36.1 C)   TempSrc:   Axillary   Resp:  18 12 17   Weight:   363 lb 5.1 oz (164.8 kg)   SpO2: 96% 96% 98% 98%   Labs:   Lab Results  Component Value Date   WBC 8.1 09/21/2015   HGB 7.8* 09/21/2015   HCT 26.1* 09/21/2015   MCV 96.3 09/21/2015   PLT 165 09/21/2015     Recent Labs Lab 09/21/15 0559  NA 141  K 3.8  CL 104  CO2 26  BUN 57*  CREATININE 2.27*  CALCIUM 9.7  GLUCOSE 87    Discharge Medications:    Medication List  TAKE these medications        acetaminophen 325 MG tablet  Commonly known as:  TYLENOL  Take 1-2 tablets (325-650 mg total) by mouth every 6 (six) hours as needed for mild pain.     albuterol 108 (90 Base) MCG/ACT inhaler  Commonly known as:  PROVENTIL HFA;VENTOLIN HFA  Inhale 1 puff into the lungs every 6 (six) hours as needed for wheezing or shortness of breath.     albuterol (2.5 MG/3ML) 0.083% nebulizer solution  Commonly known as:  PROVENTIL  Take 2.5 mg by nebulization every 8 (eight) hours as needed for wheezing.     allopurinol 100 MG tablet  Commonly known as:  ZYLOPRIM  Take 100 mg by  mouth daily.     atorvastatin 20 MG tablet  Commonly known as:  LIPITOR  Take 20 mg by mouth daily.     budesonide-formoterol 80-4.5 MCG/ACT inhaler  Commonly known as:  SYMBICORT  Inhale 2 puffs into the lungs 2 (two) times daily.     BYDUREON 2 MG Pen  Generic drug:  Exenatide ER  Inject 2 mg into the skin once a week. Saturday     cyanocobalamin 500 MCG tablet  Take 500 mcg by mouth daily.     ferrous sulfate 325 (65 FE) MG tablet  Take 325 mg by mouth 2 (two) times daily with a meal.     loratadine 10 MG tablet  Commonly known as:  CLARITIN  Take 10 mg by mouth daily.     metoprolol succinate 25 MG 24 hr tablet  Commonly known as:  TOPROL-XL  Take 1 tablet (25 mg total) by mouth 2 (two) times daily.     multivitamin with minerals Tabs tablet  Take 1 tablet by mouth daily. Reported on 07/09/2015     potassium chloride SA 20 MEQ tablet  Commonly known as:  K-DUR,KLOR-CON  Take 2 tablets (40 mEq total) by mouth 2 (two) times daily.     Rivaroxaban 15 MG Tabs tablet  Commonly known as:  XARELTO  Take 1 tablet (15 mg total) by mouth daily with supper.     torsemide 20 MG tablet  Commonly known as:  DEMADEX  Take 4 tablets (80 mg total) by mouth 2 (two) times daily.     vitamin C 500 MG tablet  Commonly known as:  ASCORBIC ACID  Take 500 mg by mouth daily.     Vitamin D3 5000 units Caps  Take 1 capsule by mouth daily.        Disposition:  Home  Discharge Instructions    Diet - low sodium heart healthy    Complete by:  As directed      Heart Failure patients record your daily weight using the same scale at the same time of day    Complete by:  As directed      Increase activity slowly    Complete by:  As directed           Follow-up Information    Follow up with [facility] On 10/01/2015.   Specialty:  Cardiology   Why:  3:00PM, wound check   Contact information:   [address] [place] [phone]      Follow up with Will [name], MD On 12/10/2015.   Specialty:  Cardiology   Why:  3:00PM   Contact information:   [address] [place] [address] [phone]  Follow up with [name], MD On 10/05/2015.   Specialty:  Cardiology   Why:  at 1000 for post hospital follow up.  Please bring all of your medications to your visit. The code for parking is 0020.   Contact information:   [place] [address] [phone]       Duration of Discharge Encounter: Greater than 30 minutes including physician time.  Jacalyn Lefevre, PA-C 09/21/2015 9:18 AM

## 2015-09-20 NOTE — Evaluation (Signed)
Physical Therapy Evaluation Patient Details Name: Mayerlin Dorner MRN: [id number] DOB: 1951/04/26 Today's Date: 09/20/2015   History of Present Illness  pt presents post Pacemaker Upgrade.  pt with hx of OSA, CHF, A-fib, Aortic Valve Stenosis, and CKD.    Clinical Impression  Pt seems to be near her baseline level of mobility with only exception is that she is "a little" more SOB with ambulation per pt report.  Pt seems to have good support from family and is appropriate for D/C from PT stand point.      Follow Up Recommendations Home health PT;Supervision - Intermittent    Equipment Recommendations  None recommended by PT    Recommendations for Other Services       Precautions / Restrictions Precautions Precautions: Fall Restrictions Weight Bearing Restrictions: No      Mobility  Bed Mobility               General bed mobility comments: pt sitting in recliner.   Transfers Overall transfer level: Needs assistance Equipment used: Rolling walker (2 wheeled) Transfers: Sit to/from Stand Sit to Stand: Min guard         General transfer comment: Guarding for safety, however pt demonstrates good technique.  pt minimizes use of L UE.    Ambulation/Gait Ambulation/Gait assistance: Min guard Ambulation Distance (Feet): 20 Feet Assistive device: Rolling walker (2 wheeled) Gait Pattern/deviations: Step-through pattern;Decreased stride length     General Gait Details: pt very labored with increased WOB with only minimal movement.  pt's VSS and indicates she is normally SOB during ambulation and this is only a little worse than normal for her.    Stairs            Wheelchair Mobility    Modified Rankin (Stroke Patients Only)       Balance Overall balance assessment: Needs assistance Sitting-balance support: No upper extremity supported;Feet supported Sitting balance-Leahy Scale: Good     Standing balance support: Bilateral upper extremity  supported;Single extremity supported;During functional activity Standing balance-Leahy Scale: Fair                               Pertinent Vitals/Pain Pain Assessment: 0-10 Pain Score: 2  Pain Location: L shoulder/chest Pain Descriptors / Indicators: Sore Pain Intervention(s): Monitored during session;Premedicated before session;Repositioned    Home Living Family/patient expects to be discharged to:: Private residence Living Arrangements: Other relatives Available Help at Discharge: Family;Available 24 hours/day Type of Home: House Home Access: Ramped entrance     Home Layout: One level Home Equipment: Walker - 2 wheels;Walker - 4 wheels;Cane - quad;Wheelchair - manual;Shower seat - built in;Electric scooter      Prior Function Level of Independence: Needs assistance   Gait / Transfers Assistance Needed: walks short distances with AD  ADL's / Homemaking Assistance Needed: can microwave food, sometimes wears depends, nephew and his wife do some cooking; mod I with bathing and dressing before she got sick         Hand Dominance   Dominant Hand: Right    Extremity/Trunk Assessment   Upper Extremity Assessment: LUE deficits/detail       LUE Deficits / Details: Not formally assessed due to recent Pacer upgrade.  AROM of elbow, hand, wrist are all WFL.    Lower Extremity Assessment: Generalized weakness         Communication   Communication: No difficulties  Cognition Arousal/Alertness: Awake/alert Behavior During Therapy: WFL for  tasks assessed/performed Overall Cognitive Status: Within Functional Limits for tasks assessed                      General Comments      Exercises        Assessment/Plan    PT Assessment Patient needs continued PT services  PT Diagnosis Difficulty walking   PT Problem List Decreased strength;Decreased activity tolerance;Decreased balance;Decreased mobility;Decreased knowledge of use of DME;Cardiopulmonary  status limiting activity;Obesity  PT Treatment Interventions DME instruction;Gait training;Functional mobility training;Therapeutic activities;Therapeutic exercise;Balance training;Patient/family education   PT Goals (Current goals can be found in the Care Plan section) Acute Rehab PT Goals Patient Stated Goal: Home PT Goal Formulation: With patient Time For Goal Achievement: 09/27/15 Potential to Achieve Goals: Good    Frequency Min 3X/week   Barriers to discharge        Co-evaluation               End of Session Equipment Utilized During Treatment: Gait belt Activity Tolerance: Patient limited by fatigue Patient left: in chair;with call bell/phone within reach Nurse Communication: Mobility status    Functional Assessment Tool Used: Clinical Judgement Functional Limitation: Mobility: Walking and moving around Mobility: Walking and Moving Around Current Status [phone]): At least 1 percent but less than 20 percent impaired, limited or restricted Mobility: Walking and Moving Around Goal Status [phone]): 0 percent impaired, limited or restricted    Time: [id number] PT Time Calculation (min) (ACUTE ONLY): 28 min   Charges:   PT Evaluation $PT Eval Moderate Complexity: 1 Procedure PT Treatments $Gait Training: 8-22 mins   PT G Codes:   PT G-Codes **NOT FOR INPATIENT CLASS** Functional Assessment Tool Used: Clinical Judgement Functional Limitation: Mobility: Walking and moving around Mobility: Walking and Moving Around Current Status [id number]): At least 1 percent but less than 20 percent impaired, limited or restricted Mobility: Walking and Moving Around Goal Status [phone]): 0 percent impaired, limited or restricted    Catarina Hartshorn, [place] 09/20/2015, 1:31 PM

## 2015-09-20 NOTE — Progress Notes (Signed)
Noted pt only able to do 20 ft with PT and apparently this is her baseline. Not appropriate for our services right now. Yves Dill CES, ACSM 2:40 PM 09/20/2015

## 2015-09-20 NOTE — Progress Notes (Signed)
Patient ID: Robyn Porter, female   DOB: 1951-04-18, 65 y.o.   MRN: [id number]   SUBJECTIVE: Patient diuresed reasonably well on IV Lasix yesterday.  Doing fine this morning.  Has not been out of bed yet today.   Scheduled Meds: . allopurinol  100 mg Oral Daily  . atorvastatin  20 mg Oral Q supper  . cholecalciferol  5,000 Units Oral Daily  . Exenatide ER  2 mg Subcutaneous Weekly  . ferrous sulfate  325 mg Oral BID WC  . fluticasone furoate-vilanterol  1 puff Inhalation Daily  . furosemide  80 mg Intravenous Q8H  . insulin aspart  0-24 Units Subcutaneous TID AC & HS  . loratadine  10 mg Oral Daily  . metolazone  2.5 mg Oral Once  . metoprolol succinate  25 mg Oral BID  . multivitamin with minerals  1 tablet Oral Daily  . mupirocin ointment  1 application Topical Once  . potassium chloride SA  40 mEq Oral BID  . Rivaroxaban  15 mg Oral Q supper  . vancomycin  1,000 mg Intravenous Q24H  . cyanocobalamin  500 mcg Oral Daily  . vitamin C  500 mg Oral Daily   Continuous Infusions:  PRN Meds:.acetaminophen, albuterol, ondansetron (ZOFRAN) IV    Filed Vitals:   09/19/15 1623 09/19/15 1942 09/19/15 2024 09/20/15 0500  BP: 121/44 109/39  122/44  Pulse: 77 68 70 64  Temp: 97.7 F (36.5 C) 97.8 F (36.6 C)  97.5 F (36.4 C)  TempSrc: Oral Oral  Oral  Resp: 19 17 18 16   Weight:    354 lb 15.1 oz (161 kg)  SpO2: 94% 94% 97% 99%    Intake/Output Summary (Last 24 hours) at 09/20/15 0820 Last data filed at 09/20/15 [id number]  Gross per 24 hour  Intake    360 ml  Output   2725 ml  Net  -2365 ml    LABS: Basic Metabolic Panel:  Recent Labs  09/20/15 0522  NA 140  K 4.5  CL 106  CO2 25  GLUCOSE 139*  BUN 52*  CREATININE 2.20*  CALCIUM 9.6   Liver Function Tests: No results for input(s): AST, ALT, ALKPHOS, BILITOT, PROT, ALBUMIN in the last 72 hours. No results for input(s): LIPASE, AMYLASE in the last 72 hours. CBC:  Recent Labs  09/20/15 0522  WBC 4.9  HGB 8.0*    HCT 26.8*  MCV 98.5  PLT 129*   Cardiac Enzymes: No results for input(s): CKTOTAL, CKMB, CKMBINDEX, TROPONINI in the last 72 hours. BNP: Invalid input(s): POCBNP D-Dimer: No results for input(s): DDIMER in the last 72 hours. Hemoglobin A1C: No results for input(s): HGBA1C in the last 72 hours. Fasting Lipid Panel: No results for input(s): CHOL, HDL, LDLCALC, TRIG, CHOLHDL, LDLDIRECT in the last 72 hours. Thyroid Function Tests: No results for input(s): TSH, T4TOTAL, T3FREE, THYROIDAB in the last 72 hours.  Invalid input(s): FREET3 Anemia Panel: No results for input(s): VITAMINB12, FOLATE, FERRITIN, TIBC, IRON, RETICCTPCT in the last 72 hours.  RADIOLOGY: Dg Chest 2 View  09/20/2015  CLINICAL DATA:  Cardiac device in situ EXAM: CHEST  2 VIEW COMPARISON:  06/15/2015 FINDINGS: Borderline cardiomegaly again noted. No acute infiltrate or pulmonary edema. There is 2 leads cardiac pacemaker with left subclavian approach with leads in right ventricle and left coronary sinus. No infiltrate or pulmonary edema. Suboptimal study due to patient's large body habitus. Mild degenerative changes thoracic spine. IMPRESSION: No active disease. Dual lead cardiac pacemaker in place. Borderline  cardiomegaly again noted. No pneumothorax. Electronically Signed   By: Lahoma Crocker M.D.   On: 09/20/2015 08:03    PHYSICAL EXAM General: NAD Neck: JVP 14-15 cm, no thyromegaly or thyroid nodule.  Lungs: Clear to auscultation bilaterally with normal respiratory effort. CV: Nondisplaced PMI.  Heart regular S1/S2, no S3/S4, no murmur.  1+ chronic edema to knees bilaterally.   Abdomen: Soft, nontender, no hepatosplenomegaly, no distention.  Neurologic: Alert and oriented x 3.  Psych: Normal affect. Extremities: No clubbing or cyanosis.   TELEMETRY: Reviewed telemetry pt in afib, BiV pacing  ASSESSMENT AND PLAN: 65 yo with history of OHS/OSA, chronic systolic CHF and chronic atrial fibrillation is seen after she  had LV lead placement for CRT upgrade. We were asked to assess for assistance with CHF management.  1. Acute on chronic systolic CHF: Suspect nonischemic cardiomyopathy. EF 40-45% on last echo in 2/17. She had CRT upgrade of her pacemaker 5/24 given chronic RV pacing and decreased EF. On exam, she remains volume overloaded with elevated JVP. She diuresed well yesterday. - Continue Lasix 80 mg IV every 8 hrs.  She will get a dose of metolazone 2.5 mg x 1 today. Will give KCl 40 bid.  - Continue home Toprol XL.   - She has not been on ACE/ARB/ARNI due to elevated creatinine.  2. Bradycardia: She has been pacer dependent, now s/p CRT upgrade to avoid lone RV pacing.  3. Atrial fibrillation: Chronic, x years. Continue on Xarelto 15 mg daily.  4 .OHS/OSA: She uses CPAP with oxygen at night.  5. Anemia: Chronic. No overt bleeding.  6. Disposition: Hopefully home tomorrow.   [name] 09/20/2015 8:23 AM

## 2015-09-21 DIAGNOSIS — I5023 Acute on chronic systolic (congestive) heart failure: Secondary | ICD-10-CM | POA: Diagnosis not present

## 2015-09-21 DIAGNOSIS — I447 Left bundle-branch block, unspecified: Secondary | ICD-10-CM | POA: Diagnosis not present

## 2015-09-21 DIAGNOSIS — I482 Chronic atrial fibrillation: Secondary | ICD-10-CM | POA: Diagnosis not present

## 2015-09-21 DIAGNOSIS — I429 Cardiomyopathy, unspecified: Secondary | ICD-10-CM | POA: Diagnosis not present

## 2015-09-21 LAB — BASIC METABOLIC PANEL
Anion gap: 11 (ref 5–15)
BUN: 57 mg/dL — AB (ref 6–20)
CALCIUM: 9.7 mg/dL (ref 8.9–10.3)
CO2: 26 mmol/L (ref 22–32)
Chloride: 104 mmol/L (ref 101–111)
Creatinine, Ser: 2.27 mg/dL — ABNORMAL HIGH (ref 0.44–1.00)
GFR calc Af Amer: 25 mL/min — ABNORMAL LOW (ref >60)
GFR, EST NON AFRICAN AMERICAN: 21 mL/min — AB (ref >60)
GLUCOSE: 87 mg/dL (ref 65–99)
Potassium: 3.8 mmol/L (ref 3.5–5.1)
SODIUM: 141 mmol/L (ref 135–145)

## 2015-09-21 LAB — CBC
HCT: 26.1 % — ABNORMAL LOW (ref 36.0–46.0)
Hemoglobin: 7.8 g/dL — ABNORMAL LOW (ref 12.0–15.0)
MCH: 28.8 pg (ref 26.0–34.0)
MCHC: 29.9 g/dL — AB (ref 30.0–36.0)
MCV: 96.3 fL (ref 78.0–100.0)
PLATELETS: 165 10*3/uL (ref 150–400)
RBC: 2.71 MIL/uL — ABNORMAL LOW (ref 3.87–5.11)
RDW: 18.7 % — AB (ref 11.5–15.5)
WBC: 8.1 10*3/uL (ref 4.0–10.5)

## 2015-09-21 LAB — GLUCOSE, CAPILLARY: GLUCOSE-CAPILLARY: 88 mg/dL (ref 65–99)

## 2015-09-21 MED ORDER — TORSEMIDE 20 MG PO TABS: 80.0000 mg | ORAL_TABLET | Freq: Two times a day (BID) | ORAL | Status: DC

## 2015-09-21 MED ORDER — ACETAMINOPHEN 325 MG PO TABS: 325.0000 mg | ORAL_TABLET | ORAL | Status: DC | PRN

## 2015-09-21 MED ORDER — ACETAMINOPHEN 325 MG PO TABS: 325.0000 mg | ORAL_TABLET | Freq: Four times a day (QID) | ORAL | Status: AC | PRN

## 2015-09-21 NOTE — Progress Notes (Signed)
DC instructions given to patient, questions answered. Educated on new medication regimens and arm restrictions. CCMD notified of DC. All belongings sent home with patient including home CPAP machine. VSS. Rx sent home with patient. DC delayed d/t awaiting ride from nephew. PIV DC, hemostasis achieved. PPM site check, CDI, steri strips in place, bruising noted. Will continue to monitor until time of discharge.   Pt escorted to private vehicle via wheelchair by volunteer.

## 2015-09-21 NOTE — Progress Notes (Signed)
Patient ID: Robyn Porter, female   DOB: Sep 21, 1950, 65 y.o.   MRN: [id number]   SUBJECTIVE: Patient diuresed reasonably well on IV Lasix with metolazone yesterday.  Weights not accurate.  Doing fine this morning.  Limited mobility but this is her baseline.   Scheduled Meds: . allopurinol  100 mg Oral Daily  . atorvastatin  20 mg Oral Q supper  . Chlorhexidine Gluconate Cloth  6 each Topical Q0600  . cholecalciferol  5,000 Units Oral Daily  . ferrous sulfate  325 mg Oral BID WC  . furosemide  80 mg Intravenous Q8H  . insulin aspart  0-24 Units Subcutaneous TID AC & HS  . loratadine  10 mg Oral Daily  . metoprolol succinate  25 mg Oral BID  . mometasone-formoterol  2 puff Inhalation BID  . multivitamin with minerals  1 tablet Oral Daily  . mupirocin ointment  1 application Topical Once  . mupirocin ointment  1 application Nasal BID  . potassium chloride SA  40 mEq Oral BID  . Rivaroxaban  15 mg Oral Q supper  . cyanocobalamin  500 mcg Oral Daily  . vitamin C  500 mg Oral Daily   Continuous Infusions:  PRN Meds:.acetaminophen, albuterol, ondansetron (ZOFRAN) IV    Filed Vitals:   09/20/15 1937 09/20/15 2047 09/20/15 2053 09/21/15 0435  BP: 126/34   120/47  Pulse: 62  65 64  Temp: 96.8 F (36 C)   96.9 F (36.1 C)  TempSrc: Axillary   Axillary  Resp: 19  18 12   Weight:    363 lb 5.1 oz (164.8 kg)  SpO2: 97% 96% 96% 98%    Intake/Output Summary (Last 24 hours) at 09/21/15 0850 Last data filed at 09/21/15 0645  Gross per 24 hour  Intake    360 ml  Output   3550 ml  Net  -3190 ml    LABS: Basic Metabolic Panel:  Recent Labs  09/20/15 0522 09/21/15 0559  NA 140 141  K 4.5 3.8  CL 106 104  CO2 25 26  GLUCOSE 139* 87  BUN 52* 57*  CREATININE 2.20* 2.27*  CALCIUM 9.6 9.7   Liver Function Tests: No results for input(s): AST, ALT, ALKPHOS, BILITOT, PROT, ALBUMIN in the last 72 hours. No results for input(s): LIPASE, AMYLASE in the last 72  hours. CBC:  Recent Labs  09/20/15 0522 09/21/15 0559  WBC 4.9 8.1  HGB 8.0* 7.8*  HCT 26.8* 26.1*  MCV 98.5 96.3  PLT 129* 165   Cardiac Enzymes: No results for input(s): CKTOTAL, CKMB, CKMBINDEX, TROPONINI in the last 72 hours. BNP: Invalid input(s): POCBNP D-Dimer: No results for input(s): DDIMER in the last 72 hours. Hemoglobin A1C: No results for input(s): HGBA1C in the last 72 hours. Fasting Lipid Panel: No results for input(s): CHOL, HDL, LDLCALC, TRIG, CHOLHDL, LDLDIRECT in the last 72 hours. Thyroid Function Tests: No results for input(s): TSH, T4TOTAL, T3FREE, THYROIDAB in the last 72 hours.  Invalid input(s): FREET3 Anemia Panel: No results for input(s): VITAMINB12, FOLATE, FERRITIN, TIBC, IRON, RETICCTPCT in the last 72 hours.  RADIOLOGY: Dg Chest 2 View  09/20/2015  CLINICAL DATA:  Cardiac device in situ EXAM: CHEST  2 VIEW COMPARISON:  06/15/2015 FINDINGS: Borderline cardiomegaly again noted. No acute infiltrate or pulmonary edema. There is 2 leads cardiac pacemaker with left subclavian approach with leads in right ventricle and left coronary sinus. No infiltrate or pulmonary edema. Suboptimal study due to patient's large body habitus. Mild degenerative changes thoracic spine.  IMPRESSION: No active disease. Dual lead cardiac pacemaker in place. Borderline cardiomegaly again noted. No pneumothorax. Electronically Signed   By: Lahoma Crocker M.D.   On: 09/20/2015 08:03    PHYSICAL EXAM General: NAD Neck: JVP 8 cm, no thyromegaly or thyroid nodule.  Lungs: Clear to auscultation bilaterally with normal respiratory effort. CV: Nondisplaced PMI.  Heart regular S1/S2, no S3/S4, no murmur.  1+ chronic edema to knees bilaterally.   Abdomen: Soft, nontender, no hepatosplenomegaly, no distention.  Neurologic: Alert and oriented x 3.  Psych: Normal affect. Extremities: No clubbing or cyanosis.   TELEMETRY: Reviewed telemetry pt in afib, BiV pacing  ASSESSMENT AND  PLAN: 65 yo with history of OHS/OSA, chronic systolic CHF and chronic atrial fibrillation is seen after she had LV lead placement for CRT upgrade. We were asked to assess for assistance with CHF management.  1. Acute on chronic systolic CHF: Suspect nonischemic cardiomyopathy. EF 40-45% on last echo in 2/17. She had CRT upgrade of her pacemaker 5/24 given chronic RV pacing and decreased EF. Volume status better. She diuresed well yesterday. - Will give IV Lasix today, then let her go home today. She will take torsemide 80 mg po bid at home.   - Continue home Toprol XL.   - She has not been on ACE/ARB/ARNI due to elevated creatinine.  2. Bradycardia: She has been pacer dependent, now s/p CRT upgrade to avoid lone RV pacing.  3. Atrial fibrillation: Chronic, x years. Continue on Xarelto 15 mg daily.  4 .OHS/OSA: She uses CPAP with oxygen at night.  5. Anemia: Chronic. No overt bleeding.  6. CKD: Creatinine is at her baseline, around 2.2-2.3.  6. Disposition: Home today.  Resume home meds except increase torsemide to 80 mg po bid.  Needs followup CHF clinic 2 weeks or so (think it is already scheduled).    [name] 09/21/2015 8:50 AM

## 2015-09-21 NOTE — Discharge Instructions (Signed)
Supplemental Discharge Instructions for  Pacemaker/Defibrillator Patients  Activity No heavy lifting or vigorous activity with your left/right arm for 6 to 8 weeks.  Do not raise your left/right arm above your head for one week.  Gradually raise your affected arm as drawn below.             09/23/15                     09/24/15                    09/25/15                   09/26/15 __  NO DRIVING for  1 week   ; you may begin driving on  [id number]   .  WOUND CARE - Keep the wound area clean and dry.  Do not get this area wet for one week. No showers for one week; you may shower on 09/26/15   . - The tape/steri-strips on your wound will fall off; do not pull them off.  No bandage is needed on the site.  DO  NOT apply any creams, oils, or ointments to the wound area. - If you notice any drainage or discharge from the wound, any swelling or bruising at the site, or you develop a fever > 101? F after you are discharged home, call the office at once.  Special Instructions - You are still able to use cellular telephones; use the ear opposite the side where you have your pacemaker/defibrillator.  Avoid carrying your cellular phone near your device. - When traveling through airports, show security personnel your identification card to avoid being screened in the metal detectors.  Ask the security personnel to use the hand wand. - Avoid arc welding equipment, MRI testing (magnetic resonance imaging), TENS units (transcutaneous nerve stimulators).  Call the office for questions about other devices. - Avoid electrical appliances that are in poor condition or are not properly grounded. - Microwave ovens are safe to be near or to operate.  Additional information for defibrillator patients should your device go off: - If your device goes off ONCE and you feel fine afterward, notify the device clinic nurses. - If your device goes off ONCE and you do not feel well afterward, call 911. - If your device goes  off TWICE, call 911. - If your device goes off THREE times in one day, call 911.  DO NOT DRIVE YOURSELF OR A FAMILY MEMBER WITH A DEFIBRILLATOR TO THE HOSPITAL--CALL 911.   --------------------------------------------------------------------------------------- Information on my medicine - XARELTO (Rivaroxaban)  This medication education was reviewed with me or my healthcare representative as part of my discharge preparation.    Why was Xarelto prescribed for you? Xarelto was prescribed for you to reduce the risk of a blood clot forming that can cause a stroke if you have a medical condition called atrial fibrillation (a type of irregular heartbeat).  What do you need to know about xarelto ? Take your Xarelto ONCE DAILY at the same time every day with your evening meal. If you have difficulty swallowing the tablet whole, you may crush it and mix in applesauce just prior to taking your dose.  Take Xarelto exactly as prescribed by your doctor and DO NOT stop taking Xarelto without talking to the doctor who prescribed the medication.  Stopping without other stroke prevention medication to take the place of Xarelto may increase your risk of  developing a clot that causes a stroke.  Refill your prescription before you run out.  After discharge, you should have regular check-up appointments with your healthcare provider that is prescribing your Xarelto.  In the future your dose may need to be changed if your kidney function or weight changes by a significant amount.  What do you do if you miss a dose? If you are taking Xarelto ONCE DAILY and you miss a dose, take it as soon as you remember on the same day then continue your regularly scheduled once daily regimen the next day. Do not take two doses of Xarelto at the same time or on the same day.   Important Safety Information A possible side effect of Xarelto is bleeding. You should call your healthcare provider right away if you  experience any of the following: ? Bleeding from an injury or your nose that does not stop. ? Unusual colored urine (red or dark brown) or unusual colored stools (red or black). ? Unusual bruising for unknown reasons. ? A serious fall or if you hit your head (even if there is no bleeding).  Some medicines may interact with Xarelto and might increase your risk of bleeding while on Xarelto. To help avoid this, consult your healthcare provider or pharmacist prior to using any new prescription or non-prescription medications, including herbals, vitamins, non-steroidal anti-inflammatory drugs (NSAIDs) and supplements.  This website has more information on Xarelto: [email or web address]

## 2015-09-21 NOTE — Progress Notes (Signed)
Physical Therapy Treatment Patient Details Name: Robyn Porter MRN: [id number] DOB: 10-14-1950 Today's Date: 09/21/2015    History of Present Illness pt presents post Pacemaker Upgrade with addition of LV lead.  pt with hx of OSA, CHF, A-fib, Aortic Valve Stenosis, and CKD, morbid obesity    PT Comments    Pt progressing although slowly; she continues to demonstrate incr WOB with minimal activity (although improved from yesterday); she has limited use of LUE d/t new pacememaker with addition of LV lead with fair amount of pain with activity; she uses RW at baseline, limited use/ability to WB on LUE increases fall risk; pt will benefit from HHPT to allow return to I lifestyle/prevent falls/incr safety and activity tol  Follow Up Recommendations  Home health PT;Supervision - Intermittent     Equipment Recommendations  None recommended by PT    Recommendations for Other Services       Precautions / Restrictions Precautions Precautions: Fall Restrictions Weight Bearing Restrictions: No    Mobility  Bed Mobility Overal bed mobility: Needs Assistance Bed Mobility: Supine to Sit     Supine to sit: Min guard     General bed mobility comments: unable to push with LUE d/t pain/new pacemaker; effortful transition with incr time required; pt has difficulty scooting fwd/back in sitting d/t limited use of LUE/pain  Transfers Overall transfer level: Needs assistance Equipment used: Rolling walker (2 wheeled) Transfers: Sit to/from Stand Sit to Stand: Min guard;Supervision         General transfer comment:  sit to stand from varying ht surfaces;Guarding for safety, however pt demonstrates good technique.  pt minimizes use of L UE duirng transition  Ambulation/Gait Ambulation/Gait assistance: Min guard Ambulation Distance (Feet): 15 Feet (x2)       Gait velocity interpretation: Below normal speed for age/gender General Gait Details:  increased WOB with movement, although  improved from yesterday; pt's VSS wtih HR 60s, RR 15-18; pt inidicates she does get SOB at baseline but is still weaker/more dyspneic than her normal   Stairs            Wheelchair Mobility    Modified Rankin (Stroke Patients Only)       Balance Overall balance assessment: Needs assistance   Sitting balance-Leahy Scale: Good     Standing balance support: Single extremity supported;No upper extremity supported;During functional activity Standing balance-Leahy Scale: Fair                      Cognition Arousal/Alertness: Awake/alert Behavior During Therapy: WFL for tasks assessed/performed Overall Cognitive Status: Within Functional Limits for tasks assessed                      Exercises General Exercises - Lower Extremity Long Arc Quad: AROM;Strengthening;Both;15 reps Toe Raises: AROM;Both;15 reps Heel Raises: Both;AROM;15 reps    General Comments General comments (skin integrity, edema, etc.): pt has incr difficulty performing hygiene after toileting, states she does have toilet aide at home      Pertinent Vitals/Pain      Home Living                      Prior Function            PT Goals (current goals can now be found in the care plan section) Acute Rehab PT Goals Patient Stated Goal: Home PT Goal Formulation: With patient Time For Goal Achievement: 09/27/15 Potential to Achieve Goals: Good Progress  towards PT goals: Progressing toward goals    Frequency  Min 3X/week    PT Plan Current plan remains appropriate    Co-evaluation             End of Session Equipment Utilized During Treatment: Gait belt Activity Tolerance: Patient limited by fatigue Patient left: in chair;with call bell/phone within reach     Time: 0924-0954 PT Time Calculation (min) (ACUTE ONLY): 30 min  Charges:  $Gait Training: 8-22 mins $Therapeutic Activity: 8-22 mins                    G Codes:  Functional Assessment Tool Used:  Clinical Judgement Functional Limitation: Mobility: Walking and moving around Mobility: Walking and Moving Around Goal Status [phone]): 0 percent impaired, limited or restricted Mobility: Walking and Moving Around Discharge Status [phone]): At least 1 percent but less than 20 percent impaired, limited or restricted   [facility] 09/21/2015, 10:07 AM

## 2015-09-26 DIAGNOSIS — E1122 Type 2 diabetes mellitus with diabetic chronic kidney disease: Secondary | ICD-10-CM

## 2015-09-26 DIAGNOSIS — I482 Chronic atrial fibrillation: Secondary | ICD-10-CM | POA: Diagnosis not present

## 2015-09-26 DIAGNOSIS — Z48812 Encounter for surgical aftercare following surgery on the circulatory system: Secondary | ICD-10-CM | POA: Diagnosis not present

## 2015-09-26 DIAGNOSIS — I5023 Acute on chronic systolic (congestive) heart failure: Secondary | ICD-10-CM | POA: Diagnosis not present

## 2015-10-01 ENCOUNTER — Ambulatory Visit (INDEPENDENT_AMBULATORY_CARE_PROVIDER_SITE_OTHER): Payer: Medicare Other | Admitting: *Deleted

## 2015-10-01 ENCOUNTER — Encounter: Payer: Self-pay | Admitting: Cardiology

## 2015-10-01 DIAGNOSIS — Z950 Presence of cardiac pacemaker: Secondary | ICD-10-CM | POA: Diagnosis not present

## 2015-10-01 DIAGNOSIS — I5022 Chronic systolic (congestive) heart failure: Secondary | ICD-10-CM

## 2015-10-01 LAB — CUP PACEART INCLINIC DEVICE CHECK
Battery Remaining Longevity: 75.6
Brady Statistic RV Percent Paced: 95 %
Date Time Interrogation Session: 20170605171111
Implantable Lead Implant Date: 20170524
Implantable Lead Location: 753860
Implantable Lead Model: 5076
Lead Channel Impedance Value: 675 Ohm
Lead Channel Pacing Threshold Amplitude: 0.75 V
Lead Channel Pacing Threshold Amplitude: 1.75 V
Lead Channel Pacing Threshold Amplitude: 1.75 V
Lead Channel Pacing Threshold Pulse Width: 0.5 ms
Lead Channel Setting Pacing Amplitude: 2 V
Lead Channel Setting Pacing Pulse Width: 0.5 ms
Lead Channel Setting Pacing Pulse Width: 0.8 ms
Lead Channel Setting Sensing Sensitivity: 4 mV
MDC IDC LEAD IMPLANT DT: 20151022
MDC IDC LEAD LOCATION: 753858
MDC IDC MSMT BATTERY VOLTAGE: 2.99 V
MDC IDC MSMT LEADCHNL LV PACING THRESHOLD PULSEWIDTH: 0.8 ms
MDC IDC MSMT LEADCHNL LV PACING THRESHOLD PULSEWIDTH: 0.8 ms
MDC IDC MSMT LEADCHNL RV IMPEDANCE VALUE: 375 Ohm
MDC IDC MSMT LEADCHNL RV PACING THRESHOLD AMPLITUDE: 0.75 V
MDC IDC MSMT LEADCHNL RV PACING THRESHOLD PULSEWIDTH: 0.5 ms
MDC IDC SET LEADCHNL LV PACING AMPLITUDE: 2.75 V
MDC IDC STAT BRADY RA PERCENT PACED: 0 %
Pulse Gen Serial Number: 7880462

## 2015-10-01 NOTE — Progress Notes (Signed)
Wound check appointment. Steri-strips removed. Wound without redness. Hematoma noted- patient reports significant improvement in size- I advised her to call us with any redness, increase in size or drainage. Incision edges approximated, wound well healed. Normal device function. Thresholds, sensing, and impedances consistent with implant measurements. 95% BiV pacing (BiV pacing with PVCs presenting). Auto capture programmed on for extra safety margin until 3 month visit. Histogram distribution appropriate for patient and level of activity. No high ventricular rates noted. Patient educated about wound care, arm mobility, lifting restrictions. ROV with WC 12/10/15.

## 2015-10-03 ENCOUNTER — Telehealth: Payer: Self-pay | Admitting: Cardiology

## 2015-10-03 NOTE — Telephone Encounter (Signed)
Returned patient's call.  Patient reports that she thinks her device made a beeping noise approximately 40 minutes ago.  She denies any symptoms.  Advised patient to send a manual transmission from her home monitor and I will review it and call her back.  Patient verbalizes understanding and denies questions or concerns at this time.

## 2015-10-03 NOTE — Telephone Encounter (Signed)
Spoke with patient.  Advised that transmission was received and that her device function is stable.  She denies any cardiac symptoms, but was just concerned because she thought something was wrong with her device.  Per the device, it has not delivered the patient notifier (ringing/vibrating).  Patient's presenting rhythm is BiVp w/PVCs.  Patient states that she was about to receive a [employer] O2 delivery at the time and that she was "running around" and may have just heard something else.  She states that her [facility] RN saw her this morning and told her she was doing well.  Patient is appreciative of call and denies questions or concerns at this time.  Patient is aware to call in the future with worsening symptoms, questions, or concerns.

## 2015-10-03 NOTE — Telephone Encounter (Signed)
Follow-up      The pt calling back to make sure the signal did go through this time.   1. Has your device fired? no  2. Is you device beeping? no  3. Are you experiencing draining or swelling at device site? no  4. Are you calling to see if we received your device transmission? yes  5. Have you passed out? no

## 2015-10-03 NOTE — Telephone Encounter (Signed)
New message     1. Has your device fired? no  2. Is you device beeping? yes  3. Are you experiencing draining or swelling at device site? no  4. Are you calling to see if we received your device transmission? no  5. Have you passed out? No    The pt thinks she heard a beeping signal but not certain

## 2015-10-05 ENCOUNTER — Ambulatory Visit (HOSPITAL_COMMUNITY)
Admission: RE | Admit: 2015-10-05 | Discharge: 2015-10-05 | Disposition: A | Discharge status: Home / Self Care | Payer: Medicare Other | Source: Ambulatory Visit | Attending: Cardiology | Admitting: Cardiology

## 2015-10-05 VITALS — BP 128/74 | HR 68 | Wt 362.5 lb

## 2015-10-05 DIAGNOSIS — I5022 Chronic systolic (congestive) heart failure: Secondary | ICD-10-CM

## 2015-10-05 DIAGNOSIS — Z79899 Other long term (current) drug therapy: Secondary | ICD-10-CM | POA: Insufficient documentation

## 2015-10-05 DIAGNOSIS — G4733 Obstructive sleep apnea (adult) (pediatric): Secondary | ICD-10-CM | POA: Diagnosis not present

## 2015-10-05 DIAGNOSIS — N183 Chronic kidney disease, stage 3 (moderate): Secondary | ICD-10-CM | POA: Insufficient documentation

## 2015-10-05 DIAGNOSIS — Z7901 Long term (current) use of anticoagulants: Secondary | ICD-10-CM | POA: Diagnosis not present

## 2015-10-05 DIAGNOSIS — I5023 Acute on chronic systolic (congestive) heart failure: Secondary | ICD-10-CM | POA: Insufficient documentation

## 2015-10-05 DIAGNOSIS — Z950 Presence of cardiac pacemaker: Secondary | ICD-10-CM | POA: Diagnosis not present

## 2015-10-05 DIAGNOSIS — D649 Anemia, unspecified: Secondary | ICD-10-CM | POA: Insufficient documentation

## 2015-10-05 DIAGNOSIS — I878 Other specified disorders of veins: Secondary | ICD-10-CM | POA: Insufficient documentation

## 2015-10-05 DIAGNOSIS — J45909 Unspecified asthma, uncomplicated: Secondary | ICD-10-CM | POA: Insufficient documentation

## 2015-10-05 DIAGNOSIS — I482 Chronic atrial fibrillation, unspecified: Secondary | ICD-10-CM

## 2015-10-05 DIAGNOSIS — E662 Morbid (severe) obesity with alveolar hypoventilation: Secondary | ICD-10-CM | POA: Insufficient documentation

## 2015-10-05 DIAGNOSIS — R001 Bradycardia, unspecified: Secondary | ICD-10-CM | POA: Diagnosis not present

## 2015-10-05 DIAGNOSIS — Z7951 Long term (current) use of inhaled steroids: Secondary | ICD-10-CM | POA: Insufficient documentation

## 2015-10-05 MED ORDER — POTASSIUM CHLORIDE CRYS ER 20 MEQ PO TBCR: 40.0000 meq | EXTENDED_RELEASE_TABLET | Freq: Two times a day (BID) | ORAL | Status: DC

## 2015-10-05 MED ORDER — METOLAZONE 2.5 MG PO TABS: 2.5000 mg | ORAL_TABLET | ORAL | Status: DC

## 2015-10-05 NOTE — Patient Instructions (Signed)
Start Metolazone 2.5 mg every Saturday  Take an extra 40 meq (2 tabs) on Saturday's when you take Metolazone  Labs in about 10 days with Home Health  Your physician recommends that you schedule a follow-up appointment in: 3 weeks

## 2015-10-05 NOTE — Progress Notes (Signed)
Patient ID: Barbarann Mincy, female   DOB: 1950/11/26, 65 y.o.   MRN: [id number]    Advanced Heart Failure Clinic Note   PCP: Dr. Marveen Reeks [facility]) Pulmonary: Dr Donovan Kail  Cardiology: Dr. Aundra Dubin  65 yo with history of OHS/OSA, chronic systolic CHF and chronic atrial fibrillation presents for CHF clinic followup.  She was admitted in 2/17 to [facility] with massive volume overload.  She was aggressively diuresed in the hospital and lost around 70 lbs.  She had BRBPR in the hospital, this was thought to be hemorrhoidal bleeding (had colonoscopy).  Echo in 2/17 showed EF 40-45% with mild AS and mild AI.  She was discharged to SNF.  Admitted 09/19/15 - 09/21/15 for upgrade to CRT-P . She was noted to be markedly volume overloaded so HF team took over for diuresis.  Overall diuresed 5.5 L, though weights were thought to be innacurate. (showed up 9 lbs despite good diuresis). Discharge weight 363 lbs.   She presents today for post hospital follow up. Weight down an addition 1 lb from discharge. Thinks she is feeling somewhat better since CRT-P upgrade. It's easier to breathe, but still gets SOB at times. PT has noticed a difference in functional status since CRT-P placed. Weight at home 353-356. She has mild orthopnea, but no CP or PND.  Stools black from iron, no BRBPR noted. Wears CPAP most nights. Had a recent CPAP titration.   Labs (3/17): K 3.5, creatinine 2.26 => 2.03, HCT 25, BNP 133 Labs (07/19/15) K 3.8, creatinine 1.94, BNP 149.0, Hgb 7.1 Labs (5/17): K 3.8, creatinine 2.27  PMH: 1. Asthma 2. OHS/OSA: CPAP, home oxygen. 3. CHF: Chronic systolic CHF.   - Echo (Q000111Q) with EF 40-45%, mild aortic stenosis, moderate aortic insufficiency.  - Medtronic CRT upgrade 5/17.  4. Aortic valve disorder: Mild aortic stenosis, moderate aortic insufficiency on 2/17 echo. 5. Chronic atrial fibrillation. 6. CKD stage III 7. Lower GI bleeding in 2/17, likely hemorrhoidal.   8. Symptomatic bradycardia: Had  Medtronic PPM, upgraded to CRT-P.     SH: Currently in SNF but normally lives in [place] with nephew.  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  Nonsmoker, no ETOH.   FH: No premature CAD.  ROS: All systems reviewed and negative except as per HPI.   Current Outpatient Prescriptions  Medication Sig Dispense Refill  . acetaminophen (TYLENOL) 325 MG tablet Take 1-2 tablets (325-650 mg total) by mouth every 6 (six) hours as needed for mild pain.    Marland Kitchen albuterol (PROVENTIL HFA;VENTOLIN HFA) 108 (90 Base) MCG/ACT inhaler Inhale 1 puff into the lungs every 6 (six) hours as needed for wheezing or shortness of breath.    Marland Kitchen albuterol (PROVENTIL) (2.5 MG/3ML) 0.083% nebulizer solution Take 2.5 mg by nebulization every 8 (eight) hours as needed for wheezing.   0  . allopurinol (ZYLOPRIM) 100 MG tablet Take 100 mg by mouth daily.    Marland Kitchen atorvastatin (LIPITOR) 20 MG tablet Take 20 mg by mouth daily.    . budesonide-formoterol (SYMBICORT) 80-4.5 MCG/ACT inhaler Inhale 2 puffs into the lungs 2 (two) times daily.    . Cholecalciferol (VITAMIN D3) 5000 units CAPS Take 1 capsule by mouth daily.    . cyanocobalamin 500 MCG tablet Take 500 mcg by mouth daily.    . Exenatide ER (BYDUREON) 2 MG PEN Inject 2 mg into the skin once a week. Saturday    . ferrous sulfate 325 (65 FE) MG tablet Take 325 mg by mouth 2 (two) times  daily with a meal.     . loratadine (CLARITIN) 10 MG tablet Take 10 mg by mouth daily.    . metoprolol succinate (TOPROL-XL) 25 MG 24 hr tablet Take 1 tablet (25 mg total) by mouth 2 (two) times daily. 60 tablet 3  . Multiple Vitamin (MULTIVITAMIN WITH MINERALS) TABS tablet Take 1 tablet by mouth daily. Reported on 07/09/2015    . potassium chloride SA (K-DUR,KLOR-CON) 20 MEQ tablet Take 2 tablets (40 mEq total) by mouth 2 (two) times daily. 120 tablet 6  . Rivaroxaban (XARELTO) 15 MG TABS tablet Take 1 tablet (15 mg total) by mouth daily with supper. 30 tablet 6  . torsemide (DEMADEX) 20  MG tablet Take 4 tablets (80 mg total) by mouth 2 (two) times daily. 240 tablet 6  . vitamin C (ASCORBIC ACID) 500 MG tablet Take 500 mg by mouth daily.     No current facility-administered medications for this encounter.   BP 128/74 mmHg  Pulse 68  Wt 362 lb 8 oz (164.429 kg)  SpO2 97%   Wt Readings from Last 3 Encounters:  10/05/15 362 lb 8 oz (164.429 kg)  09/21/15 363 lb 5.1 oz (164.8 kg)  09/13/15 354 lb 9.6 oz (160.846 kg)     General: NAD, obese.  Neck: JVP 10 cm, no thyromegaly or thyroid nodule.  Lungs: Clear, normal effort CV: Nondisplaced PMI.  Heart irregular S1/S2, no S3/S4, 2/6 SEM RUSB with clear S2.  1-2+ chronic edema to knees. Chronic venous stasis changes, L>R.  No carotid bruit.  Normal pedal pulses. Still with mild edema over CRT-P site.  Abdomen: Morbidly Obese, soft, non-tender, non-distended., no HSM. No bruits or masses. +BS   Skin: Intact without lesions or rashes.  Neurologic: Alert and oriented x 3.  Psych: Normal affect. Extremities: No clubbing or cyanosis.  HEENT: Normal.   Assessment/Plan: 1. Acute on chronic systolic CHF: Suspect nonischemic.  EF 40-45% on 2/17 echo. Recent upgrade to Medtronic CRT given constant RV pacing. Volume status difficult to assess with patient weight, but seems to still have mild volume overload. NYHA class III-IIIb symptoms.  - Continue torsemide 80 BID and KCl 40 bid. Will add metolazone 2.5 mg every Saturday with extra 40 meq of potassium on those days.  BMET/BNP today and in 10 days.  - ContinueToprol XL 25 mg bid. No ACEIARB with elevated creatinine.  - No room to up-titrate with soft BP 2. Symptomatic bradycardia: MDT PPM.  Upgraded to CRT given high percentage RV pacing.  3. CKD: Stage III. Check BMET today and recheck in 10 days with home health.  4. Chronic atrial fibrillation: She continues on Toprol XL and Xarelto. No further BRBPR.  5. OHS/OSA: Continue CPAP at night and oxygen as needed during the day.  She  will need pulmonary followup.  6. Anemia: Per PCP/GI. 7. Chronic venous stasis: Difficult to differentiate between this and her volume overload with her weight.  8. Morbid obesity - Continue PT - Encouraged to increase activity as able and limit portions.   Adding metolazone as above with extra K. BMET today and repeat in 10 days via [facility].  Will follow up on PA side in 3 weeks.  Satira Mccallum Tillery PA-C 10/05/2015   Patient seen with PA, agree with the above note.  She still appears volume overloaded.  We will add metolazone 2.5 mg once weekly with an extra 40 mEeq KCl on metolazone days.  BMET/BNP today and in 10 days.  She feels  subjectively better after CRT upgrade.   [name] 10/06/2015

## 2015-10-16 ENCOUNTER — Encounter: Payer: Self-pay | Admitting: Cardiology

## 2015-10-19 ENCOUNTER — Other Ambulatory Visit (HOSPITAL_COMMUNITY): Payer: Self-pay | Admitting: Cardiology

## 2015-10-19 ENCOUNTER — Telehealth (HOSPITAL_COMMUNITY): Payer: Self-pay | Admitting: Vascular Surgery

## 2015-10-19 NOTE — Telephone Encounter (Signed)
[facility] ,needs someone to call back ASAP ,they are Seeing her for [facility] her face to face needs to be signed , Myriam Jacobson faxed it back to be signed [phone] FAX #.Marland Kitchen PLEASE ADVISE

## 2015-10-25 ENCOUNTER — Encounter (HOSPITAL_COMMUNITY): Payer: Self-pay

## 2015-10-25 ENCOUNTER — Ambulatory Visit (HOSPITAL_COMMUNITY)
Admission: RE | Admit: 2015-10-25 | Discharge: 2015-10-25 | Disposition: A | Discharge status: Home / Self Care | Payer: Medicare Other | Source: Ambulatory Visit | Attending: Cardiology | Admitting: Cardiology

## 2015-10-25 VITALS — BP 106/64 | HR 64 | Ht 64.0 in | Wt 355.0 lb

## 2015-10-25 DIAGNOSIS — N183 Chronic kidney disease, stage 3 (moderate): Secondary | ICD-10-CM | POA: Diagnosis not present

## 2015-10-25 DIAGNOSIS — J45909 Unspecified asthma, uncomplicated: Secondary | ICD-10-CM | POA: Diagnosis not present

## 2015-10-25 DIAGNOSIS — R001 Bradycardia, unspecified: Secondary | ICD-10-CM | POA: Insufficient documentation

## 2015-10-25 DIAGNOSIS — Z7901 Long term (current) use of anticoagulants: Secondary | ICD-10-CM | POA: Diagnosis not present

## 2015-10-25 DIAGNOSIS — I5022 Chronic systolic (congestive) heart failure: Secondary | ICD-10-CM | POA: Insufficient documentation

## 2015-10-25 DIAGNOSIS — D649 Anemia, unspecified: Secondary | ICD-10-CM | POA: Diagnosis not present

## 2015-10-25 DIAGNOSIS — I482 Chronic atrial fibrillation: Secondary | ICD-10-CM | POA: Insufficient documentation

## 2015-10-25 DIAGNOSIS — Z950 Presence of cardiac pacemaker: Secondary | ICD-10-CM

## 2015-10-25 DIAGNOSIS — Z8679 Personal history of other diseases of the circulatory system: Secondary | ICD-10-CM | POA: Diagnosis not present

## 2015-10-25 DIAGNOSIS — I358 Other nonrheumatic aortic valve disorders: Secondary | ICD-10-CM | POA: Diagnosis not present

## 2015-10-25 DIAGNOSIS — I429 Cardiomyopathy, unspecified: Secondary | ICD-10-CM | POA: Diagnosis not present

## 2015-10-25 DIAGNOSIS — G4733 Obstructive sleep apnea (adult) (pediatric): Secondary | ICD-10-CM | POA: Diagnosis not present

## 2015-10-25 LAB — BASIC METABOLIC PANEL
Anion gap: 8 (ref 5–15)
BUN: 60 mg/dL — AB (ref 6–20)
CALCIUM: 9.5 mg/dL (ref 8.9–10.3)
CHLORIDE: 102 mmol/L (ref 101–111)
CO2: 29 mmol/L (ref 22–32)
CREATININE: 2.51 mg/dL — AB (ref 0.44–1.00)
GFR, EST AFRICAN AMERICAN: 22 mL/min — AB (ref >60)
GFR, EST NON AFRICAN AMERICAN: 19 mL/min — AB (ref >60)
Glucose, Bld: 109 mg/dL — ABNORMAL HIGH (ref 65–99)
Potassium: 4.2 mmol/L (ref 3.5–5.1)
SODIUM: 139 mmol/L (ref 135–145)

## 2015-10-25 MED ORDER — POTASSIUM CHLORIDE CRYS ER 20 MEQ PO TBCR: 40.0000 meq | EXTENDED_RELEASE_TABLET | Freq: Two times a day (BID) | ORAL | Status: DC

## 2015-10-25 MED ORDER — TORSEMIDE 20 MG PO TABS
80.0000 mg | ORAL_TABLET | Freq: Two times a day (BID) | ORAL | Status: DC
Start: 2015-10-25 — End: 2015-11-15

## 2015-10-25 NOTE — Progress Notes (Signed)
Spoke with Ms. Herrada about Xarelto copay once she is in the "donut hole". She recently retired and has now switched from [employer] to [employer] and currently her copays are $27/mo. We discussed [employer] patient assistance (will need to have spent $622.08 on Rx's for the year to qualify) and she will notify me when she does reach the donut hole.   [name]. [name], PharmD, BCPS, CPP Clinical Pharmacist Pager: [phone] Phone: [phone] 10/25/2015 4:28 PM

## 2015-10-25 NOTE — Patient Instructions (Signed)
Labs today  Your physician recommends that you schedule a follow-up appointment in: 6 weeks with PA In the [facility]

## 2015-10-25 NOTE — Progress Notes (Signed)
Patient ID: Robyn Porter, female   DOB: July 03, 1950, 65 y.o.   MRN: [id number]    Advanced Heart Failure Clinic Note   PCP: Dr. Marveen Reeks [facility]) Pulmonary: Dr Donovan Kail  Cardiology: Dr. Aundra Dubin  65 yo with history of OHS/OSA, chronic systolic CHF and chronic atrial fibrillation presents for CHF clinic followup.  She was admitted in 2/17 to [facility] with massive volume overload.  She was aggressively diuresed in the hospital and lost around 70 lbs.  She had BRBPR in the hospital, this was thought to be hemorrhoidal bleeding (had colonoscopy).  Echo in 2/17 showed EF 40-45% with mild AS and mild AI.  She was discharged to SNF.  Admitted 09/19/15 - 09/21/15 for upgrade to CRT-P . She was noted to be markedly volume overloaded so HF team took over for diuresis.  Overall diuresed 5.5 L, though weights were thought to be innacurate. (showed up 9 lbs despite good diuresis). Discharge weight 363 lbs.   She presents today for regular follow up. At lat visit added weekly metolazone. Weight down 7 lbs from last visit. Feels about the same.  Gets SOB walking, about 60 feet on a good day. Occasional lightheadedness with prolonged standing. Weight at home 349 - 354. Notices a difference on metolazone days. Has been constipated the past few days, so may also contribute to slight weight gain over past several days. Trying to wear CPAP most nights, wearing more often now. Is less orthopneic. No PND. Denies CP. No fevers or chills. Does feel like breathing is better, feels like strength is returning slowly (recovers more quickly when she does get SOB).  No further blood in stool that she has been aware of.   Labs (3/17): K 3.5, creatinine 2.26 => 2.03, HCT 25, BNP 133 Labs (07/19/15) K 3.8, creatinine 1.94, BNP 149.0, Hgb 7.1 Labs (5/17): K 3.8, creatinine 2.27  PMH: 1. Asthma 2. OHS/OSA: CPAP, home oxygen. 3. CHF: Chronic systolic CHF.   - Echo (Q000111Q) with EF 40-45%, mild aortic stenosis, moderate aortic  insufficiency.  - Medtronic CRT upgrade 5/17.  4. Aortic valve disorder: Mild aortic stenosis, moderate aortic insufficiency on 2/17 echo. 5. Chronic atrial fibrillation. 6. CKD stage III 7. Lower GI bleeding in 2/17, likely hemorrhoidal.   8. 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     SH: Lives in [place] with nephew.  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 Nonsmoker, no ETOH.   FH: No premature CAD.  ROS: All systems reviewed and negative except as per HPI.   Current Outpatient Prescriptions  Medication Sig Dispense Refill  . acetaminophen (TYLENOL) 325 MG tablet Take 1-2 tablets (325-650 mg total) by mouth every 6 (six) hours as needed for mild pain.    Marland Kitchen albuterol (PROVENTIL HFA;VENTOLIN HFA) 108 (90 Base) MCG/ACT inhaler Inhale 1 puff into the lungs every 6 (six) hours as needed for wheezing or shortness of breath.    Marland Kitchen albuterol (PROVENTIL) (2.5 MG/3ML) 0.083% nebulizer solution Take 2.5 mg by nebulization every 8 (eight) hours as needed for wheezing.   0  . allopurinol (ZYLOPRIM) 100 MG tablet Take 100 mg by mouth daily.    Marland Kitchen atorvastatin (LIPITOR) 20 MG tablet Take 20 mg by mouth daily.    . budesonide-formoterol (SYMBICORT) 80-4.5 MCG/ACT inhaler Inhale 2 puffs into the lungs 2 (two) times daily.    . Cholecalciferol (VITAMIN D3) 5000 units CAPS Take 1 capsule by mouth daily.    . cyanocobalamin 500  MCG tablet Take 500 mcg by mouth daily.    . Exenatide ER (BYDUREON) 2 MG PEN Inject 2 mg into the skin once a week. Saturday    . ferrous sulfate 325 (65 FE) MG tablet Take 650 mg by mouth 2 (two) times daily with a meal.     . loratadine (CLARITIN) 10 MG tablet Take 10 mg by mouth daily.    . metolazone (ZAROXOLYN) 2.5 MG tablet Take 1 tablet (2.5 mg total) by mouth once a week. Every Saturday 10 tablet 3  . metoprolol succinate (TOPROL-XL) 25 MG 24 hr tablet Take 1 tablet (25 mg total) by mouth 2 (two) times  daily. 60 tablet 3  . Multiple Vitamin (MULTIVITAMIN WITH MINERALS) TABS tablet Take 1 tablet by mouth daily. Reported on 07/09/2015    . potassium chloride SA (K-DUR,KLOR-CON) 20 MEQ tablet Take 2 tablets (40 mEq total) by mouth 2 (two) times daily. Take an extra 40 meq when you take Metolazone 120 tablet 6  . Rivaroxaban (XARELTO) 15 MG TABS tablet Take 1 tablet (15 mg total) by mouth daily with supper. 30 tablet 6  . torsemide (DEMADEX) 20 MG tablet Take 4 tablets (80 mg total) by mouth 2 (two) times daily. 240 tablet 6  . vitamin C (ASCORBIC ACID) 500 MG tablet Take 500 mg by mouth daily.     No current facility-administered medications for this encounter.   BP 106/64 mmHg  Pulse 64  Ht 5\' 4"  (1.626 m)  Wt 355 lb (161.027 kg)  BMI 60.91 kg/m2  SpO2 96%   Wt Readings from Last 3 Encounters:  10/25/15 355 lb (161.027 kg)  10/05/15 362 lb 8 oz (164.429 kg)  09/21/15 363 lb 5.1 oz (164.8 kg)     General: NAD, obese.  Neck: JVP 7-8 cm, no thyromegaly or thyroid nodule.  Lungs: Clear, normal effort CV: Nondisplaced PMI.  Heart irregular S1/S2, no S3/S4, 2/6 SEM RUSB with clear S2. 1+ edema to 1/2 to knee with chronic venous stasis changes, L>R.  No carotid bruit.  Normal pedal pulses. CRT-P site stable,  Abdomen: Morbidly Obese, soft, non-tender, non-distended., no HSM. No bruits or masses. +BS   Skin: Intact without lesions or rashes.  Neurologic: Alert and oriented x 3.  Psych: Normal affect. Extremities: No clubbing or cyanosis.  HEENT: Normal.   Assessment/Plan: 1. Chronic systolic CHF: Suspect nonischemic.  EF 40-45% on 2/17 echo. Recent upgrade to Medtronic CRT given constant RV pacing. Volume status improved from previous with addition of metolazone. Though she continues with NYHA class III-IIIb symptoms.  - Continue torsemide 80 BID and KCl 40 bid. BMET today. - Continue metolazone 2.5 mg every Saturday with extra 40 meq of potassium on those days. - ContinueToprol XL 25 mg  bid. No ACEIARB with elevated creatinine.  - No room to up-titrate with soft BP 2. Symptomatic bradycardia: MDT PPM.  Upgraded to CRT given high percentage RV pacing.  3. CKD: Stage III. - BMET today.  4. Chronic atrial fibrillation: She continues on Toprol XL and Xarelto. No further BRBPR.  5. OHS/OSA: Continue CPAP at night and oxygen as needed during the day.  She will need pulmonary followup.  6. Anemia: Per PCP/GI. 7. Chronic venous stasis: Difficult to differentiate between this and her volume status with her weight.  8. Morbid obesity - Continue PT - Encouraged to increase activity as able and to continue limiting portions.  9. Disability - EP cleared for return to work on 10/13/15, this was  likely from perspective of having CRT-P placed.    - From HF standpoint, she cannot walk > 60 feet and should NOT return to work. Will work on getting her disability approved.   Doing better on increased diuretics. BP on softer side so will not up-titrate meds today.  Check BMET today.   Satira Mccallum Concettina Leth PA-C 10/25/2015

## 2015-11-02 ENCOUNTER — Telehealth (HOSPITAL_COMMUNITY): Payer: Self-pay | Admitting: Cardiology

## 2015-11-02 MED ORDER — METOLAZONE 2.5 MG PO TABS: 2.5000 mg | ORAL_TABLET | ORAL | Status: DC | PRN

## 2015-11-02 NOTE — Telephone Encounter (Signed)
-----   Message from Shirley Friar, PA-C sent at 10/25/2015  7:30 PM EDT ----- Can we also have her [place] draw a BMET in 2 weeks and fax it to [place]?  Thanks.    Legrand Como [address] [place], PA-C 10/25/2015 7:30 PM

## 2015-11-02 NOTE — Telephone Encounter (Signed)
Pt aware and voiced understanding  Order faxed to [facility] fax # [phone]

## 2015-11-06 ENCOUNTER — Encounter (HOSPITAL_COMMUNITY): Payer: Self-pay

## 2015-11-06 NOTE — Progress Notes (Signed)
[employer] faxed medical record request and Cardiovascular Assessment and Physical Capabilities Eval Form dated 10/02/2015. All medical records from our office/providers 06/27/15--present and forms completed as requested. Faxed to provided # [phone] (67 pages total). Copy of request and forms scanned into patient's electronic medical record. LTD Claim # [id number]  Renee Pain

## 2015-11-14 ENCOUNTER — Telehealth (HOSPITAL_COMMUNITY): Payer: Self-pay | Admitting: *Deleted

## 2015-11-14 ENCOUNTER — Encounter: Payer: Self-pay | Admitting: Cardiology

## 2015-11-14 DIAGNOSIS — I5022 Chronic systolic (congestive) heart failure: Secondary | ICD-10-CM

## 2015-11-14 NOTE — Telephone Encounter (Addendum)
Received labs drawn by [facility] on 11/13/15:  K  4.5 BUN 71 CR 3.04  Per Oda Kilts, [place] and Dr Aundra Dubin check with pt to see if she has been taking Metolazone, have pt hold Torsemide and KCL for 2 days then restart at Tor 60 mg BID and KCL 20 meq BID, refer to renal MD.  Have attempted to call pt, line was answered however no one said anything and then line was disconnected, attempted to call back and line was busy, will try again later

## 2015-11-15 ENCOUNTER — Telehealth (HOSPITAL_COMMUNITY): Payer: Self-pay | Admitting: Vascular Surgery

## 2015-11-15 MED ORDER — TORSEMIDE 20 MG PO TABS: 60.0000 mg | ORAL_TABLET | Freq: Two times a day (BID) | ORAL | Status: DC

## 2015-11-15 MED ORDER — POTASSIUM CHLORIDE CRYS ER 20 MEQ PO TBCR: 20.0000 meq | EXTENDED_RELEASE_TABLET | Freq: Two times a day (BID) | ORAL | Status: DC

## 2015-11-15 NOTE — Telephone Encounter (Signed)
Pt returned call to [facility] .Marland Kitchen Please advise

## 2015-11-15 NOTE — Telephone Encounter (Signed)
Order for repeat bmet next week faxed to [facility] at [phone]

## 2015-11-15 NOTE — Telephone Encounter (Signed)
Spoke w/pt, she is aware of results and med changes.  She states she did take the Metolazone on Mon 7/17 b/c she had gained 5 lb over a week and it helped.  She will hold meds for now and restart Sat at new lower doses, she request to see nephrology in [place], referral faxed to [facility]

## 2015-11-15 NOTE — Telephone Encounter (Signed)
See phone note dated 7/19

## 2015-11-16 ENCOUNTER — Telehealth (HOSPITAL_COMMUNITY): Payer: Self-pay

## 2015-11-16 ENCOUNTER — Other Ambulatory Visit (HOSPITAL_COMMUNITY): Payer: Self-pay | Admitting: Internal Medicine

## 2015-11-16 NOTE — Telephone Encounter (Signed)
Left VM on both lines listed on patient's chart that she will need to either make bmet lab appointment with CHF clinic next week or we will mail Rx for lab to be drawn at location of her choice closer to home. [facility] states she has been discharged from their services. Left VM to call us back and let us know which route she would like to take to get BMET drawn next week.  Renee Pain

## 2015-11-20 ENCOUNTER — Encounter: Payer: Self-pay | Admitting: Cardiology

## 2015-11-26 ENCOUNTER — Telehealth: Payer: Self-pay | Admitting: Cardiology

## 2015-11-26 NOTE — Telephone Encounter (Signed)
Pt seen by Dr. Aundra Dubin in [facility].  Will forward to CHF.

## 2015-11-26 NOTE — Telephone Encounter (Signed)
Mrs. Paredez is calling because she has gain 11 lbs since 11/10/15 and is on Furosemide . Has another medication , but not sure of the name and is confused on whether she should take it or not , Also she is supposed have labs done and want to know can we fax over the order to her primary care doctor so that they can get the labs . Please call   Thanks

## 2015-11-27 NOTE — Telephone Encounter (Signed)
Returned call to patient who states since her last visit 3-4 weeks ago she has been treated for "leg infection" (sounds like cellulitis) with 10 day course of Keflex. Endorses swelling in both legs, one more than the other that has cellulitis, and because of the pain and swelling was bed bound for about 1-2 weeks and unable to ambulate or weigh. Reports weight on  11/10/15 of 363 lbs (11 lbs up from last OV) 11/24/15 of 375 lbs (22 lbs up from last OV) Patient denies SOB or any chagnes to her breathing, CP, dizziness, or any other concerning s/s. Does report generalized swelling and thought this may have been due to her antibiotics, the infected leg, or just general weight gain as she confirms she is not SOB so did not believe it to be fluid. Per Oda Kilts PA-C, advised to get repeat BMET that she was supposed to get last week done today in order to further assess and treat. Patient has 1:30 pm apt with PCP today.  BMET order faxed to his office (Dr. Michell Heinrich at [facility] in [place]). Advised patient if she becomes more symptomatic to report to ED. Will await to see results of BMET before Oda Kilts PA-C and/or other CHF provider further treats this issue. Patient aware and agreeable to plan as stated above.  Renee Pain RN

## 2015-11-28 ENCOUNTER — Telehealth (HOSPITAL_COMMUNITY): Payer: Self-pay

## 2015-11-28 ENCOUNTER — Emergency Department (HOSPITAL_COMMUNITY): Payer: Medicare Other

## 2015-11-28 ENCOUNTER — Encounter (HOSPITAL_COMMUNITY): Payer: Self-pay | Admitting: *Deleted

## 2015-11-28 ENCOUNTER — Inpatient Hospital Stay (HOSPITAL_COMMUNITY)
Admission: EM | Admit: 2015-11-28 | Discharge: 2015-12-11 | DRG: 377 | Disposition: A | Discharge status: Home Health | Payer: Medicare Other | Attending: Internal Medicine | Admitting: Internal Medicine

## 2015-11-28 ENCOUNTER — Telehealth: Payer: Self-pay | Admitting: Cardiology

## 2015-11-28 DIAGNOSIS — E1122 Type 2 diabetes mellitus with diabetic chronic kidney disease: Secondary | ICD-10-CM | POA: Diagnosis present

## 2015-11-28 DIAGNOSIS — K5521 Angiodysplasia of colon with hemorrhage: Secondary | ICD-10-CM | POA: Diagnosis present

## 2015-11-28 DIAGNOSIS — K922 Gastrointestinal hemorrhage, unspecified: Secondary | ICD-10-CM | POA: Diagnosis present

## 2015-11-28 DIAGNOSIS — E669 Obesity, unspecified: Secondary | ICD-10-CM

## 2015-11-28 DIAGNOSIS — Z6841 Body Mass Index (BMI) 40.0 and over, adult: Secondary | ICD-10-CM | POA: Diagnosis not present

## 2015-11-28 DIAGNOSIS — E785 Hyperlipidemia, unspecified: Secondary | ICD-10-CM | POA: Diagnosis present

## 2015-11-28 DIAGNOSIS — I5043 Acute on chronic combined systolic (congestive) and diastolic (congestive) heart failure: Secondary | ICD-10-CM | POA: Diagnosis present

## 2015-11-28 DIAGNOSIS — Z950 Presence of cardiac pacemaker: Secondary | ICD-10-CM | POA: Diagnosis not present

## 2015-11-28 DIAGNOSIS — E876 Hypokalemia: Secondary | ICD-10-CM | POA: Diagnosis not present

## 2015-11-28 DIAGNOSIS — D649 Anemia, unspecified: Secondary | ICD-10-CM | POA: Diagnosis not present

## 2015-11-28 DIAGNOSIS — Z881 Allergy status to other antibiotic agents status: Secondary | ICD-10-CM

## 2015-11-28 DIAGNOSIS — I352 Nonrheumatic aortic (valve) stenosis with insufficiency: Secondary | ICD-10-CM | POA: Diagnosis present

## 2015-11-28 DIAGNOSIS — J449 Chronic obstructive pulmonary disease, unspecified: Secondary | ICD-10-CM | POA: Diagnosis present

## 2015-11-28 DIAGNOSIS — I130 Hypertensive heart and chronic kidney disease with heart failure and stage 1 through stage 4 chronic kidney disease, or unspecified chronic kidney disease: Secondary | ICD-10-CM | POA: Diagnosis present

## 2015-11-28 DIAGNOSIS — D631 Anemia in chronic kidney disease: Secondary | ICD-10-CM | POA: Diagnosis present

## 2015-11-28 DIAGNOSIS — Z7951 Long term (current) use of inhaled steroids: Secondary | ICD-10-CM

## 2015-11-28 DIAGNOSIS — I5022 Chronic systolic (congestive) heart failure: Secondary | ICD-10-CM | POA: Diagnosis present

## 2015-11-28 DIAGNOSIS — D500 Iron deficiency anemia secondary to blood loss (chronic): Secondary | ICD-10-CM | POA: Diagnosis present

## 2015-11-28 DIAGNOSIS — Z7984 Long term (current) use of oral hypoglycemic drugs: Secondary | ICD-10-CM

## 2015-11-28 DIAGNOSIS — E1169 Type 2 diabetes mellitus with other specified complication: Secondary | ICD-10-CM

## 2015-11-28 DIAGNOSIS — I482 Chronic atrial fibrillation, unspecified: Secondary | ICD-10-CM

## 2015-11-28 DIAGNOSIS — I5033 Acute on chronic diastolic (congestive) heart failure: Secondary | ICD-10-CM | POA: Diagnosis not present

## 2015-11-28 DIAGNOSIS — I428 Other cardiomyopathies: Secondary | ICD-10-CM | POA: Diagnosis present

## 2015-11-28 DIAGNOSIS — K254 Chronic or unspecified gastric ulcer with hemorrhage: Secondary | ICD-10-CM | POA: Diagnosis not present

## 2015-11-28 DIAGNOSIS — Z91041 Radiographic dye allergy status: Secondary | ICD-10-CM

## 2015-11-28 DIAGNOSIS — E662 Morbid (severe) obesity with alveolar hypoventilation: Secondary | ICD-10-CM | POA: Diagnosis present

## 2015-11-28 DIAGNOSIS — D62 Acute posthemorrhagic anemia: Secondary | ICD-10-CM | POA: Diagnosis present

## 2015-11-28 DIAGNOSIS — I272 Other secondary pulmonary hypertension: Secondary | ICD-10-CM | POA: Diagnosis present

## 2015-11-28 DIAGNOSIS — E1159 Type 2 diabetes mellitus with other circulatory complications: Secondary | ICD-10-CM | POA: Diagnosis present

## 2015-11-28 DIAGNOSIS — N184 Chronic kidney disease, stage 4 (severe): Secondary | ICD-10-CM | POA: Diagnosis present

## 2015-11-28 DIAGNOSIS — N179 Acute kidney failure, unspecified: Secondary | ICD-10-CM | POA: Diagnosis present

## 2015-11-28 DIAGNOSIS — Z888 Allergy status to other drugs, medicaments and biological substances status: Secondary | ICD-10-CM

## 2015-11-28 DIAGNOSIS — G4733 Obstructive sleep apnea (adult) (pediatric): Secondary | ICD-10-CM | POA: Diagnosis present

## 2015-11-28 DIAGNOSIS — I878 Other specified disorders of veins: Secondary | ICD-10-CM | POA: Diagnosis present

## 2015-11-28 DIAGNOSIS — Z91013 Allergy to seafood: Secondary | ICD-10-CM

## 2015-11-28 DIAGNOSIS — K648 Other hemorrhoids: Secondary | ICD-10-CM | POA: Diagnosis present

## 2015-11-28 DIAGNOSIS — K921 Melena: Secondary | ICD-10-CM

## 2015-11-28 DIAGNOSIS — I5023 Acute on chronic systolic (congestive) heart failure: Secondary | ICD-10-CM | POA: Diagnosis not present

## 2015-11-28 DIAGNOSIS — K5520 Angiodysplasia of colon without hemorrhage: Secondary | ICD-10-CM | POA: Diagnosis not present

## 2015-11-28 DIAGNOSIS — R531 Weakness: Secondary | ICD-10-CM | POA: Diagnosis present

## 2015-11-28 DIAGNOSIS — Z7901 Long term (current) use of anticoagulants: Secondary | ICD-10-CM | POA: Diagnosis not present

## 2015-11-28 DIAGNOSIS — Z91048 Other nonmedicinal substance allergy status: Secondary | ICD-10-CM

## 2015-11-28 DIAGNOSIS — I5042 Chronic combined systolic (congestive) and diastolic (congestive) heart failure: Secondary | ICD-10-CM | POA: Diagnosis not present

## 2015-11-28 DIAGNOSIS — I509 Heart failure, unspecified: Secondary | ICD-10-CM | POA: Diagnosis not present

## 2015-11-28 DIAGNOSIS — Z7401 Bed confinement status: Secondary | ICD-10-CM

## 2015-11-28 LAB — COMPREHENSIVE METABOLIC PANEL
ALBUMIN: 2.9 g/dL — AB (ref 3.5–5.0)
ALK PHOS: 84 U/L (ref 38–126)
ALT: 13 U/L — ABNORMAL LOW (ref 14–54)
ANION GAP: 9 (ref 5–15)
AST: 23 U/L (ref 15–41)
BUN: 61 mg/dL — ABNORMAL HIGH (ref 6–20)
CALCIUM: 9.4 mg/dL (ref 8.9–10.3)
CHLORIDE: 106 mmol/L (ref 101–111)
CO2: 23 mmol/L (ref 22–32)
Creatinine, Ser: 2.35 mg/dL — ABNORMAL HIGH (ref 0.44–1.00)
GFR calc non Af Amer: 20 mL/min — ABNORMAL LOW (ref >60)
GFR, EST AFRICAN AMERICAN: 24 mL/min — AB (ref >60)
GLUCOSE: 131 mg/dL — AB (ref 65–99)
POTASSIUM: 4.2 mmol/L (ref 3.5–5.1)
SODIUM: 138 mmol/L (ref 135–145)
Total Bilirubin: 1.1 mg/dL (ref 0.3–1.2)
Total Protein: 6.4 g/dL — ABNORMAL LOW (ref 6.5–8.1)

## 2015-11-28 LAB — CBC
HEMATOCRIT: 24.1 % — AB (ref 36.0–46.0)
HEMOGLOBIN: 7.1 g/dL — AB (ref 12.0–15.0)
MCH: 29.5 pg (ref 26.0–34.0)
MCHC: 29.5 g/dL — AB (ref 30.0–36.0)
MCV: 100 fL (ref 78.0–100.0)
Platelets: 247 10*3/uL (ref 150–400)
RBC: 2.41 MIL/uL — AB (ref 3.87–5.11)
RDW: 17.5 % — ABNORMAL HIGH (ref 11.5–15.5)
WBC: 7 10*3/uL (ref 4.0–10.5)

## 2015-11-28 LAB — GLUCOSE, CAPILLARY: Glucose-Capillary: 82 mg/dL (ref 65–99)

## 2015-11-28 LAB — PREPARE RBC (CROSSMATCH)

## 2015-11-28 LAB — POC OCCULT BLOOD, ED: FECAL OCCULT BLD: POSITIVE — AB

## 2015-11-28 MED ORDER — IPRATROPIUM-ALBUTEROL 0.5-2.5 (3) MG/3ML IN SOLN: 3.0000 mL | Freq: Four times a day (QID) | RESPIRATORY_TRACT | Status: DC | PRN

## 2015-11-28 MED ORDER — INSULIN ASPART 100 UNIT/ML ~~LOC~~ SOLN
0.0000 [IU] | SUBCUTANEOUS | Status: DC
  Administered 2015-12-01 – 2015-12-03 (×2): 1 [IU] via SUBCUTANEOUS

## 2015-11-28 MED ORDER — ALLOPURINOL 100 MG PO TABS
100.0000 mg | ORAL_TABLET | Freq: Every day | ORAL | Status: DC
  Administered 2015-11-30 – 2015-12-11 (×12): 100 mg via ORAL
  Filled 2015-11-28 (×12): qty 1

## 2015-11-28 MED ORDER — SODIUM CHLORIDE 0.9 % IV SOLN: Freq: Once | INTRAVENOUS | Status: DC

## 2015-11-28 MED ORDER — HYDROCODONE-ACETAMINOPHEN 5-325 MG PO TABS: 1.0000 | ORAL_TABLET | ORAL | Status: DC | PRN

## 2015-11-28 MED ORDER — LORATADINE 10 MG PO TABS
10.0000 mg | ORAL_TABLET | Freq: Every day | ORAL | Status: DC
  Administered 2015-11-30 – 2015-12-11 (×12): 10 mg via ORAL
  Filled 2015-11-28 (×12): qty 1

## 2015-11-28 MED ORDER — PROTHROMBIN COMPLEX CONC HUMAN 500 UNITS IV KIT
5000.0000 [IU] | PACK | Status: AC
  Administered 2015-11-28: 5000 [IU] via INTRAVENOUS
  Filled 2015-11-28: qty 200

## 2015-11-28 MED ORDER — ACETAMINOPHEN 325 MG PO TABS: 650.0000 mg | ORAL_TABLET | Freq: Once | ORAL | Status: DC

## 2015-11-28 MED ORDER — ONDANSETRON HCL 4 MG/2ML IJ SOLN: 4.0000 mg | Freq: Four times a day (QID) | INTRAMUSCULAR | Status: DC | PRN

## 2015-11-28 MED ORDER — MOMETASONE FURO-FORMOTEROL FUM 100-5 MCG/ACT IN AERO
2.0000 | INHALATION_SPRAY | Freq: Two times a day (BID) | RESPIRATORY_TRACT | Status: DC
  Administered 2015-11-30 – 2015-12-11 (×19): 2 via RESPIRATORY_TRACT
  Filled 2015-11-28 (×5): qty 8.8

## 2015-11-28 MED ORDER — SODIUM CHLORIDE 0.9 % IV SOLN
Freq: Once | INTRAVENOUS | Status: AC
  Administered 2015-11-29: via INTRAVENOUS

## 2015-11-28 MED ORDER — FAMOTIDINE IN NACL 20-0.9 MG/50ML-% IV SOLN
20.0000 mg | Freq: Two times a day (BID) | INTRAVENOUS | Status: DC
  Administered 2015-11-28: 20 mg via INTRAVENOUS
  Filled 2015-11-28: qty 50

## 2015-11-28 MED ORDER — ATORVASTATIN CALCIUM 20 MG PO TABS
20.0000 mg | ORAL_TABLET | Freq: Every day | ORAL | Status: DC
  Administered 2015-11-30 – 2015-12-11 (×12): 20 mg via ORAL
  Filled 2015-11-28 (×12): qty 1

## 2015-11-28 MED ORDER — ONDANSETRON HCL 4 MG PO TABS
4.0000 mg | ORAL_TABLET | Freq: Four times a day (QID) | ORAL | Status: DC | PRN
Start: 2015-11-28 — End: 2015-12-11

## 2015-11-28 MED ORDER — ACETAMINOPHEN 650 MG RE SUPP: 650.0000 mg | Freq: Four times a day (QID) | RECTAL | Status: DC | PRN

## 2015-11-28 MED ORDER — TORSEMIDE 20 MG PO TABS: 60.0000 mg | ORAL_TABLET | Freq: Two times a day (BID) | ORAL | Status: DC

## 2015-11-28 MED ORDER — SODIUM CHLORIDE 0.9% FLUSH
3.0000 mL | Freq: Two times a day (BID) | INTRAVENOUS | Status: DC
  Administered 2015-11-29 (×2): 3 mL via INTRAVENOUS

## 2015-11-28 MED ORDER — ACETAMINOPHEN 325 MG PO TABS
650.0000 mg | ORAL_TABLET | Freq: Four times a day (QID) | ORAL | Status: DC | PRN
  Administered 2015-12-08: 650 mg via ORAL
  Filled 2015-11-28: qty 2

## 2015-11-28 NOTE — ED Triage Notes (Signed)
Pt reports having dr appt yesterday and was called today to come in due to low hgb of 7. Hx of anemia and blood transfusions. No acute distress noted at triage.

## 2015-11-28 NOTE — H&P (Addendum)
Robyn Porter [id number] DOB: 04-23-1951 DOA: 11/28/2015     PCP: Michell Heinrich, DO   Outpatient Specialists: Cardiology Milus Mallick Patient coming from:    home Lives  With family , SNF    Chief Complaint: fatigue low Hg  HPI: Robyn Porter is a 65 y.o. female with medical history significant of OHS/OSA, chronic systolic CHF and chronic atrial fibrillation on Xarelto    Presented with feeling generally fatigued and lightheaded Endorses some black stools but she is taking iron . She reports ongoing hemorrhoids with occasional drops of blood on the tissue.  Recently patient creatinine has been going up her torsemide has been held intermittently. Throat to nephrology has been set up other patient hasn't seen them yet. Recently has been treated for cellulitis with Keflex. Now she has been having swelling of both legs she's been bedbound for the past 1-2 weeks secondary to pain in the legs. She had had any shortness of breath or chest pain no lightheadedness that have been having generalized swelling. Her weight increased at least 10 lb Given up of her labs were checked and showed hemoglobin down to 7 which point she was sent to emergency department Regarding pertinent Chronic problems: Patient has history of admission to [facility] for CHF exacerbation requiring 70 pounds diuresis while at the hospital she did develop bright red blood per rectum which was thought to be secondary to hemorrhoidal bleeding diagnosed by colonoscopy echogram at that time showed EF 40-45 percent with mild AS and mild AI. Dry weight is around 353 pounds. Patient has known history of symptomatic bradycardia with pacemaker has been upgraded to CRT-P She has known history of sleep apnea on C Pap Has known history of chronic kidney disease. Baseline creatinine around 2 She has known anemia hemoglobin in March was 7.1  IN ER: Afebrile blood pressure is somewhat soft down to 99/35 currently up to  103/44 Creatinine 2.35 which is down from prior BUN 61 which is around baseline albumin 2.9 WBC 7.0 hemoglobin 7.1 which is down from 7.8 in May chest x-ray unremarkable Hemoccult was positive Provided discuss case with [facility] hold see patient in consult in the morning She was given IV Protonix ordered [facility] was called for admission for worsening anemia and ascending of fluid overload recent cellulitis Hemoccult positive stools in the setting of chronic melena  Review of Systems:    Pertinent positives include: fatigue, weight gain  Bilateral lower extremity swelling   Constitutional:  No weight loss, night sweats, Fevers, chills,  weight loss  HEENT:  No headaches, Difficulty swallowing,Tooth/dental problems,Sore throat,  No sneezing, itching, ear ache, nasal congestion, post nasal drip,  Cardio-vascular:  No chest pain, Orthopnea, PND, anasarca, dizziness, palpitations.no GI:  No heartburn, indigestion, abdominal pain, nausea, vomiting, diarrhea, change in bowel habits, loss of appetite, melena, blood in stool, hematemesis Resp:  no shortness of breath at rest. No dyspnea on exertion, No excess mucus, no productive cough, No non-productive cough, No coughing up of blood.No change in color of mucus.No wheezing. Skin:  no rash or lesions. No jaundice GU:  no dysuria, change in color of urine, no urgency or frequency. No straining to urinate.  No flank pain.  Musculoskeletal:  No joint pain or no joint swelling. No decreased range of motion. No back pain.  Psych:  No change in mood or affect. No depression or anxiety. No memory loss.  Neuro: no localizing neurological complaints, no tingling, no weakness,  no double vision, no gait abnormality, no slurred speech, no confusion  As per HPI otherwise 10 point review of systems negative.   Past Medical History: Past Medical History:  Diagnosis Date  . Afib ([place])   . CHF (congestive heart failure) ([place])   . COPD  (chronic obstructive pulmonary disease) ([place])   . Diabetes mellitus without complication ([place])   . Hypertension   . Pacemaker   . Shortness of breath dyspnea    Past Surgical History:  Procedure Laterality Date  . ABDOMINAL HYSTERECTOMY    . BIV PACEMAKER GENERATOR CHANGE OUT  09/19/2015  . COLONOSCOPY N/A 06/29/2015   Procedure: COLONOSCOPY;  Surgeon: Irene Shipper, MD;  Location: [facility] ENDOSCOPY;  Service: Endoscopy;  Laterality: N/A;  . EP IMPLANTABLE DEVICE N/A 09/19/2015   Procedure: BiV Pacemaker Insertion CRT-P;  Surgeon: Will [name], MD;  Location: [facility] LAB;  Service: Cardiovascular;  Laterality: N/A;  . PACEMAKER INSERTION       Social History:  Ambulatory  bed bound    reports that she is a non-smoker but has been exposed to tobacco smoke. She has never used smokeless tobacco. She reports that she does not drink alcohol or use drugs.  Allergies:   Allergies  Allergen Reactions  . Adhesive [Tape] Other (See Comments)    Tears skin.  Please use "paper" tape  . Amoxicillin Hives  . Contrast Media [Iodinated Diagnostic Agents] Hives  . Shellfish-Derived Products Hives    Can eat crab cakes  . Benadryl [Diphenhydramine Hcl] Rash       Family History:   Family History  Problem Relation Age of Onset  . Angina Sister   . Emphysema Sister   . Colon cancer Brother     Medications: Prior to Admission medications   Medication Sig Start Date End Date Taking? Authorizing Provider  allopurinol (ZYLOPRIM) 100 MG tablet Take 100 mg by mouth daily.   Yes Historical Provider, MD  doxycycline (VIBRAMYCIN) 100 MG capsule Take 100 mg by mouth 2 (two) times daily. For 10 days starting 11-27-15 11/27/15  Yes Historical Provider, MD  metoprolol succinate (TOPROL-XL) 25 MG 24 hr tablet Take 1 tablet (25 mg total) by mouth 2 (two) times daily. 08/06/15  Yes Larey Dresser, MD  potassium chloride SA (K-DUR,KLOR-CON) 20 MEQ tablet Take 1 tablet (20 mEq total) by mouth 2  (two) times daily. Take an extra 40 meq when you take Metolazone 11/15/15  Yes Satira Mccallum Tillery, PA-C  torsemide (DEMADEX) 20 MG tablet Take 3 tablets (60 mg total) by mouth 2 (two) times daily. 11/15/15  Yes Shirley Friar, PA-C  acetaminophen (TYLENOL) 325 MG tablet Take 1-2 tablets (325-650 mg total) by mouth every 6 (six) hours as needed for mild pain. 09/21/15   Shirley Friar, PA-C  albuterol (PROVENTIL HFA;VENTOLIN HFA) 108 (90 Base) MCG/ACT inhaler Inhale 1 puff into the lungs every 6 (six) hours as needed for wheezing or shortness of breath.    Historical Provider, MD  albuterol (PROVENTIL) (2.5 MG/3ML) 0.083% nebulizer solution Take 2.5 mg by nebulization every 8 (eight) hours as needed for wheezing.  05/22/15   Historical Provider, MD  atorvastatin (LIPITOR) 20 MG tablet Take 20 mg by mouth daily.    Historical Provider, MD  budesonide-formoterol (SYMBICORT) 80-4.5 MCG/ACT inhaler Inhale 2 puffs into the lungs 2 (two) times daily.    Historical Provider, MD  Cholecalciferol (VITAMIN D3) 5000 units CAPS Take 1 capsule by mouth daily.  Historical Provider, MD  cyanocobalamin 500 MCG tablet Take 500 mcg by mouth daily.    Historical Provider, MD  Exenatide ER (BYDUREON) 2 MG PEN Inject 2 mg into the skin once a week. Saturday    Historical Provider, MD  ferrous sulfate 325 (65 FE) MG tablet Take 650 mg by mouth 2 (two) times daily with a meal.     Historical Provider, MD  ipratropium-albuterol (DUONEB) 0.5-2.5 (3) MG/3ML SOLN every 6 (six) hours as needed. 09/15/15   Historical Provider, MD  loratadine (CLARITIN) 10 MG tablet Take 10 mg by mouth daily.    Historical Provider, MD  metolazone (ZAROXOLYN) 2.5 MG tablet Take 1 tablet (2.5 mg total) by mouth as needed (3 lb weight gain overnight or 5lb week gain in a week). Every Saturday 11/02/15   Shirley Friar, PA-C  Multiple Vitamin (MULTIVITAMIN WITH MINERALS) TABS tablet Take 1 tablet by mouth daily. Reported on  07/09/2015    Historical Provider, MD  Rivaroxaban (XARELTO) 15 MG TABS tablet Take 1 tablet (15 mg total) by mouth daily with supper. 07/23/15   Larey Dresser, MD  vitamin C (ASCORBIC ACID) 500 MG tablet Take 500 mg by mouth daily.    Historical Provider, MD    Physical Exam: Patient Vitals for the past 24 hrs:  BP Temp Temp src Pulse Resp SpO2  11/28/15 1930 (!) 103/44 - - 61 16 99 %  11/28/15 1900 (!) 107/48 - - 61 15 97 %  11/28/15 1830 (!) 99/35 - - 64 14 97 %  11/28/15 1815 (!) 103/39 - - 67 13 99 %  11/28/15 1745 - - - 64 19 99 %  11/28/15 1742 (!) 116/47 - - 63 18 99 %  11/28/15 1539 (!) 117/44 98.1 F (36.7 C) Oral 65 20 94 %    1. General:  in No Acute distress 2. Psychological: Alert and  Oriented 3. Head/ENT:   Moist  Mucous Membranes                          Head Non traumatic, neck supple                         Poor Dentition 4. SKIN: normal  Skin turgor,  Skin clean Dry and intact evidence of venous stasis changes 5. Heart: Regular rate and rhythm no Murmur, Rub or gallop 6. Lungs:   no wheezes or crackles   7. Abdomen: Soft, non-tender, Non distended, obese 8. Lower extremities: no clubbing, cyanosis, or edema 9. Neurologically Grossly intact, moving all 4 extremities equally 10. MSK: Normal range of motion Black stool hemoccult positive  body mass index is unknown because there is no height or weight on file.  Labs on Admission:   Labs on Admission: I have personally reviewed following labs and imaging studies  CBC:  Recent Labs Lab 11/28/15 1551  WBC 7.0  HGB 7.1*  HCT 24.1*  MCV 100.0  PLT [id number]   Basic Metabolic Panel:  Recent Labs Lab 11/28/15 1551  NA 138  K 4.2  CL 106  CO2 23  GLUCOSE 131*  BUN 61*  CREATININE 2.35*  CALCIUM 9.4   GFR: CrCl cannot be calculated (Unknown ideal weight.). Liver Function Tests:  Recent Labs Lab 11/28/15 1551  AST 23  ALT 13*  ALKPHOS 84  BILITOT 1.1  PROT 6.4*  ALBUMIN 2.9*   No results  for input(s): LIPASE,  AMYLASE in the last 168 hours. No results for input(s): AMMONIA in the last 168 hours. Coagulation Profile: No results for input(s): INR, PROTIME in the last 168 hours. Cardiac Enzymes: No results for input(s): CKTOTAL, CKMB, CKMBINDEX, TROPONINI in the last 168 hours. BNP (last 3 results) No results for input(s): PROBNP in the last 8760 hours. HbA1C: No results for input(s): HGBA1C in the last 72 hours. CBG: No results for input(s): GLUCAP in the last 168 hours. Lipid Profile: No results for input(s): CHOL, HDL, LDLCALC, TRIG, CHOLHDL, LDLDIRECT in the last 72 hours. Thyroid Function Tests: No results for input(s): TSH, T4TOTAL, FREET4, T3FREE, THYROIDAB in the last 72 hours. Anemia Panel: No results for input(s): VITAMINB12, FOLATE, FERRITIN, TIBC, IRON, RETICCTPCT in the last 72 hours. Urine analysis:    Component Value Date/Time   COLORURINE YELLOW 06/13/2015 0134   APPEARANCEUR CLOUDY (A) 06/13/2015 0134   LABSPEC 1.017 06/13/2015 0134   PHURINE 5.0 06/13/2015 0134   GLUCOSEU NEGATIVE 06/13/2015 0134   HGBUR NEGATIVE 06/13/2015 0134   BILIRUBINUR NEGATIVE 06/13/2015 0134   KETONESUR NEGATIVE 06/13/2015 0134   PROTEINUR 30 (A) 06/13/2015 0134   NITRITE POSITIVE (A) 06/13/2015 0134   LEUKOCYTESUR SMALL (A) 06/13/2015 0134   Sepsis Labs: @LABRCNTIP (procalcitonin:4,lacticidven:4) )No results found for this or any previous visit (from the past 240 hour(s)).     UA not obtained  No results found for: HGBA1C  CrCl cannot be calculated (Unknown ideal weight.).  BNP (last 3 results) No results for input(s): PROBNP in the last 8760 hours.   ECG REPORT  Independently reviewed Rate: 68  Rhythm: Sinus ST&T Change: No acute ischemic changes  QTC 521  There were no vitals filed for this visit.   Cultures:    Component Value Date/Time   SDES URINE, CATHETERIZED 06/13/2015 0134   SPECREQUEST NONE 06/13/2015 0134   CULT >=100,000 COLONIES/mL  PROTEUS MIRABILIS 06/13/2015 0134   REPTSTATUS 06/15/2015 FINAL 06/13/2015 0134     Radiological Exams on Admission: Dg Chest 2 View  Result Date: 11/28/2015 CLINICAL DATA:  Congestion, weakness. EXAM: CHEST  2 VIEW COMPARISON:  Radiographs of Sep 20, 2015. FINDINGS: Mild cardiomegaly is noted. Left-sided pacemaker is unchanged in position. No pneumothorax or pleural effusion is noted. No acute pulmonary disease is noted. Bony thorax is unremarkable. IMPRESSION: No active cardiopulmonary disease. Electronically Signed   By: Marijo Conception, M.D.   On: 11/28/2015 20:10    Chart has been reviewed    Assessment/Plan 65 y.o. female with medical history significant of OHS/OSA, DM2 chronic systolic CHF and chronic atrial fibrillation on Xarelto here with fluid overload fatigue and worsening anemia with Hemoccult-positive stools and long-standing melena on anticoagulation Present on Admission: . Melena - patient has chronically dark stools due to taking iron, given soft BP, stool being hem positive and hg trending down will admit. K-centra ordered given soft blood pressure and unclear picture will continue. GI consulted. Patient has been ordered 1 unit of PRBC will continue.  . Acute blood loss anemia - transfuse one unit follow CBC patient is chronically anemic appreciate GI consult will hold off anticoagulation . Chronic atrial fibrillation (HCC) given melanotic stool drifting down hemoglobin will hold off on anticoagulation and continue reversal given soft blood pressures will hold off on Toprol . Acute on Chronic systolic CHF (congestive heart failure) ([place]) evidence of increased weight gain no evidence of pulmonary edema or increased oxygen requirement. Given somewhat soft blood pressure will avoid over aggressive diuresis at this time appreciate  cardiology consult . OSA (obstructive sleep apnea) CPAP  Chronic kidney disease currently creatinine at baseline patient will need to follow up with  nephrology given soft blood pressure of weight overdiuresis right now Debility will need PT OT evaluation DM - order SSI Other plan as per orders.  DVT prophylaxis:  SCD   Code Status:  FULL CODE  as per patient    Family Communication:   Family  at  Bedside    Disposition Plan:   We'll need PT OT evaluation patient and past required nursing home placement secondary to debility  Consults called: [facility] aware Dr. Burney Gauze,   Admission status:  inpatient        Level of care        SDU      I have spent a total of 67 min on this admission    Meli Faley 11/28/2015, 9:25 PM    [facility]  Pager [phone]   after 2 AM please page floor coverage PA If 7AM-7PM, please contact the day team taking care of the patient  [email or web address]  Password [email or web address]

## 2015-11-28 NOTE — ED Provider Notes (Signed)
[facility] DEPT Provider Note   CSN: [id number] Arrival date & time: 11/28/15  1531  First Provider Contact:  None       History   Chief Complaint Chief Complaint  Patient presents with  . Abnormal Lab    HPI Robyn Porter is a 65 y.o. female.  HPI   65 year old female history of congestive heart failure, A. fib, anticoagulated on rivoraxaban presents today with increased weakness and anemia. She states that she was having some weakness and had labs drawn at her primary care physician in [place]. They noted that she was anemic and sent her to here to the ED. She reports that she had an episode of anemia when she was in the hospital last February. She reports that she had a colonoscopy but his not think that they found any source of bleeding. She states that she has had some rectal bleeding over the past week which she thought was due to hemorrhoids. She describes it as bright red and intermittent.  She denies any previous history of GI bleeding. She has not had any blood in her urine and denies hemoptysis. She has been somewhat weak and lightheaded. This has worsened over the past 24 hours.  Past Medical History:  Diagnosis Date  . Afib ([place])   . CHF (congestive heart failure) ([place])   . COPD (chronic obstructive pulmonary disease) ([place])   . Diabetes mellitus without complication ([place])   . Hypertension   . Pacemaker   . Shortness of breath dyspnea     Patient Active Problem List   Diagnosis Date Noted  . Chronic systolic congestive heart failure ([place])   . CHF (congestive heart failure) ([place]) 09/19/2015  . Chronic systolic CHF (congestive heart failure) ([place]) 07/09/2015  . Obesity hypoventilation syndrome ([place]) 07/09/2015  . Proteus mirabilis infection 07/08/2015  . HLD (hyperlipidemia) 07/08/2015  . Gout 07/08/2015  . Vitamin D deficiency 07/08/2015  . Asthma, mild   . Internal bleeding hemorrhoids   . Benign neoplasm of transverse colon   . Hematochezia   .  Chronic anticoagulation   . Acute blood loss anemia   . Morbid obesity ([place])   . NSVT (nonsustained ventricular tachycardia) ([place])   . CKD (chronic kidney disease)   . Cardiomyopathy ([place])   . AKI (acute kidney injury) ([place])   . Chronic atrial fibrillation ([place]) 06/13/2015  . Diabetes mellitus type 2 in obese ([place]) 06/13/2015  . History of pacemaker 06/13/2015  . Chronic anemia 06/13/2015  . OSA (obstructive sleep apnea) 06/13/2015    Past Surgical History:  Procedure Laterality Date  . ABDOMINAL HYSTERECTOMY    . BIV PACEMAKER GENERATOR CHANGE OUT  09/19/2015  . COLONOSCOPY N/A 06/29/2015   Procedure: COLONOSCOPY;  Surgeon: Irene Shipper, MD;  Location[facility] ENDOSCOPY;  Service: Endoscopy;  Laterality: N/A;  . EP IMPLANTABLE DEVICE N/A 09/19/2015   Procedure: BiV Pacemaker Insertion CRT-P;  Surgeon: Will [name], MD;  Location: [facility] LAB;  Service: Cardiovascular;  Laterality: N/A;  . PACEMAKER INSERTION      OB History    No data available       Home Medications    Prior to Admission medications   Medication Sig Start Date End Date Taking? Authorizing Provider  allopurinol (ZYLOPRIM) 100 MG tablet Take 100 mg by mouth daily.   Yes Historical Provider, MD  doxycycline (VIBRAMYCIN) 100 MG capsule Take 100 mg by mouth 2 (two) times daily. For 10 days starting 11-27-15 11/27/15  Yes Historical Provider,  MD  metoprolol succinate (TOPROL-XL) 25 MG 24 hr tablet Take 1 tablet (25 mg total) by mouth 2 (two) times daily. 08/06/15  Yes Larey Dresser, MD  potassium chloride SA (K-DUR,KLOR-CON) 20 MEQ tablet Take 1 tablet (20 mEq total) by mouth 2 (two) times daily. Take an extra 40 meq when you take Metolazone 11/15/15  Yes Satira Mccallum Tillery, PA-C  torsemide (DEMADEX) 20 MG tablet Take 3 tablets (60 mg total) by mouth 2 (two) times daily. 11/15/15  Yes Shirley Friar, PA-C  acetaminophen (TYLENOL) 325 MG tablet Take 1-2 tablets (325-650 mg total) by mouth every 6  (six) hours as needed for mild pain. 09/21/15   Shirley Friar, PA-C  albuterol (PROVENTIL HFA;VENTOLIN HFA) 108 (90 Base) MCG/ACT inhaler Inhale 1 puff into the lungs every 6 (six) hours as needed for wheezing or shortness of breath.    Historical Provider, MD  albuterol (PROVENTIL) (2.5 MG/3ML) 0.083% nebulizer solution Take 2.5 mg by nebulization every 8 (eight) hours as needed for wheezing.  05/22/15   Historical Provider, MD  atorvastatin (LIPITOR) 20 MG tablet Take 20 mg by mouth daily.    Historical Provider, MD  budesonide-formoterol (SYMBICORT) 80-4.5 MCG/ACT inhaler Inhale 2 puffs into the lungs 2 (two) times daily.    Historical Provider, MD  Cholecalciferol (VITAMIN D3) 5000 units CAPS Take 1 capsule by mouth daily.    Historical Provider, MD  cyanocobalamin 500 MCG tablet Take 500 mcg by mouth daily.    Historical Provider, MD  Exenatide ER (BYDUREON) 2 MG PEN Inject 2 mg into the skin once a week. Saturday    Historical Provider, MD  ferrous sulfate 325 (65 FE) MG tablet Take 650 mg by mouth 2 (two) times daily with a meal.     Historical Provider, MD  ipratropium-albuterol (DUONEB) 0.5-2.5 (3) MG/3ML SOLN every 6 (six) hours as needed. 09/15/15   Historical Provider, MD  loratadine (CLARITIN) 10 MG tablet Take 10 mg by mouth daily.    Historical Provider, MD  metolazone (ZAROXOLYN) 2.5 MG tablet Take 1 tablet (2.5 mg total) by mouth as needed (3 lb weight gain overnight or 5lb week gain in a week). Every Saturday 11/02/15   Shirley Friar, PA-C  Multiple Vitamin (MULTIVITAMIN WITH MINERALS) TABS tablet Take 1 tablet by mouth daily. Reported on 07/09/2015    Historical Provider, MD  Rivaroxaban (XARELTO) 15 MG TABS tablet Take 1 tablet (15 mg total) by mouth daily with supper. 07/23/15   Larey Dresser, MD  vitamin C (ASCORBIC ACID) 500 MG tablet Take 500 mg by mouth daily.    Historical Provider, MD    Family History Family History  Problem Relation Age of Onset  . Angina  Sister   . Emphysema Sister   . Colon cancer Brother     Social History Social History  Substance Use Topics  . Smoking status: Passive Smoke Exposure - Never Smoker  . Smokeless tobacco: Never Used  . Alcohol use No     Allergies   Adhesive [tape]; Amoxicillin; Contrast media [iodinated diagnostic agents]; Shellfish-derived products; and Benadryl [diphenhydramine hcl]   Review of Systems Review of Systems  All other systems reviewed and are negative.    Physical Exam Updated Vital Signs BP (!) 107/48   Pulse 61   Temp 98.1 F (36.7 C) (Oral)   Resp 15   SpO2 97%   Physical Exam  Constitutional: She appears well-nourished. No distress.  Morbidly obese female who does not appear  to be in any acute distress  HENT:  Head: Normocephalic and atraumatic.  Right Ear: External ear normal.  Left Ear: External ear normal.  Nose: Nose normal.  Mouth/Throat: Oropharynx is clear and moist.  Eyes: Conjunctivae and EOM are normal. Pupils are equal, round, and reactive to light.  Neck: Normal range of motion. Neck supple.  Cardiovascular: Regular rhythm.   Pulmonary/Chest: Effort normal and breath sounds normal.  Abdominal: Soft. Bowel sounds are normal. There is no tenderness.  Genitourinary: Rectal exam shows guaiac positive stool.  Genitourinary Comments: Black tarry stool noted on rectal exam  Musculoskeletal: She exhibits edema.  Neurological: She is alert.  Skin: Skin is warm and dry. Capillary refill takes less than 2 seconds.  Psychiatric: She has a normal mood and affect.  Nursing note and vitals reviewed.    ED Treatments / Results  Labs (all labs ordered are listed, but only abnormal results are displayed) Labs Reviewed  COMPREHENSIVE METABOLIC PANEL - Abnormal; Notable for the following:       Result Value   Glucose, Bld 131 (*)    BUN 61 (*)    Creatinine, Ser 2.35 (*)    Total Protein 6.4 (*)    Albumin 2.9 (*)    ALT 13 (*)    GFR calc non Af Amer  20 (*)    GFR calc Af Amer 24 (*)    All other components within normal limits  CBC - Abnormal; Notable for the following:    RBC 2.41 (*)    Hemoglobin 7.1 (*)    HCT 24.1 (*)    MCHC 29.5 (*)    RDW 17.5 (*)    All other components within normal limits  POC OCCULT BLOOD, ED - Abnormal; Notable for the following:    Fecal Occult Bld POSITIVE (*)    All other components within normal limits  TYPE AND SCREEN  PREPARE RBC (CROSSMATCH)    EKG  EKG Interpretation  Date/Time:  Wednesday November 28 2015 18:50:25 EDT Ventricular Rate:  68 PR Interval:    QRS Duration: 153 QT Interval:  489 QTC Calculation: 521 R Axis:   -73 Text Interpretation:  Junctional rhythm Nonspecific IVCD with LAD Inferior infarct, old Probable lateral infarct, age indeterminate Confirmed by Yoshua Geisinger MD, Andee Poles [phone]) on 11/28/2015 7:29:14 PM       Radiology No results found.  Procedures Procedures (including critical care time)  Medications Ordered in ED Medications  0.9 %  sodium chloride infusion (not administered)     Initial Impression / Assessment and Plan / ED Course  I have reviewed the triage vital signs and the nursing notes.  Pertinent labs & imaging results that were available during my care of the patient were reviewed by me and considered in my medical decision making (see chart for details).  Clinical Course   65 year old female history of A. fib anticoagulated comes in today with reports that she is anemic. Hemoglobin drawn prior to my valuation is 7. She has a systolic blood pressure of 99/35, but blood pressure increased to 107/50 on last check. on my evaluation. She is awake and alert and his not symptomatic sitting in the bed. Hemoccult is positive. I have initiated transfusion and reversal of anticoagulation orders. Patient does have history of congestive heart failure and has had some dyspnea. She is to be transfused 2 units of packed red blood cells. She will require careful  monitoring giving her underlying congestive heart failure. Gastroenterology and hospitalists are being  consulted. 1-anemia 2 GI bleed-discussed with Dr. Silverio Decamp on call for GI. She states that they will see in the morning, however if there are any changes that she can he called for consult. 3 anticoagulation 4 renal insufficiency-appears stable with last creatinine 2.51 on 10/25/2015   New Prescriptions   No medications on file     Pattricia Boss, MD 12/01/15 [phone]

## 2015-11-28 NOTE — Telephone Encounter (Signed)
[place], Robyn Berkshire RN called to  make Dr Aundra Dubin aware that pt's HGB came back to 6 and was being sent to [facility] ED.  Dr Haroldine Laws   made aware.

## 2015-11-28 NOTE — ED Notes (Signed)
MD at bedside. 

## 2015-11-28 NOTE — Telephone Encounter (Signed)
New message      The pt is just letting us know she coming over to [facility] for a blood transfusion cause of a low HCT/HGB   This is just a FYI

## 2015-11-28 NOTE — Progress Notes (Signed)
Pt is on home unit NIV CPAP at this time tolerating it well. Home nasal pillows. Pt is stable at this time

## 2015-11-28 NOTE — Telephone Encounter (Signed)
Employee of [employer] in [place] called to report that patient's HGB came back at 6 and was being sent to [facility] ED. Dr. Haroldine Laws made aware.  Renee Pain RN

## 2015-11-29 ENCOUNTER — Telehealth (HOSPITAL_COMMUNITY): Payer: Self-pay | Admitting: *Deleted

## 2015-11-29 ENCOUNTER — Encounter (HOSPITAL_COMMUNITY)
Admission: EM | Disposition: A | Discharge status: Home Health | Payer: Self-pay | Source: Home / Self Care | Attending: Internal Medicine

## 2015-11-29 ENCOUNTER — Encounter (HOSPITAL_COMMUNITY): Payer: Self-pay | Admitting: *Deleted

## 2015-11-29 ENCOUNTER — Inpatient Hospital Stay (HOSPITAL_COMMUNITY): Payer: Medicare Other

## 2015-11-29 ENCOUNTER — Inpatient Hospital Stay (HOSPITAL_COMMUNITY): Payer: Medicare Other | Admitting: Certified Registered Nurse Anesthetist

## 2015-11-29 DIAGNOSIS — I5022 Chronic systolic (congestive) heart failure: Secondary | ICD-10-CM

## 2015-11-29 DIAGNOSIS — I509 Heart failure, unspecified: Secondary | ICD-10-CM

## 2015-11-29 DIAGNOSIS — I5023 Acute on chronic systolic (congestive) heart failure: Secondary | ICD-10-CM

## 2015-11-29 DIAGNOSIS — Z7901 Long term (current) use of anticoagulants: Secondary | ICD-10-CM

## 2015-11-29 DIAGNOSIS — K254 Chronic or unspecified gastric ulcer with hemorrhage: Secondary | ICD-10-CM

## 2015-11-29 DIAGNOSIS — I5042 Chronic combined systolic (congestive) and diastolic (congestive) heart failure: Secondary | ICD-10-CM

## 2015-11-29 DIAGNOSIS — I482 Chronic atrial fibrillation: Secondary | ICD-10-CM

## 2015-11-29 DIAGNOSIS — D62 Acute posthemorrhagic anemia: Secondary | ICD-10-CM

## 2015-11-29 HISTORY — PX: ESOPHAGOGASTRODUODENOSCOPY: SHX5428

## 2015-11-29 LAB — ECHOCARDIOGRAM COMPLETE
AO mean calculated velocity dopler: 229 cm/s
AOPV: 0.5 m/s
AV Area VTI index: 0.61 cm2/m2
AV Area mean vel: 1.43 cm2
AV area mean vel ind: 0.57 cm2/m2
AV peak Index: 0.63
AVA: 1.53 cm2
AVAREAVTI: 1.57 cm2
AVCELMEANRAT: 0.45
AVG: 23 mmHg
AVPG: 40 mmHg
AVPKVEL: 317 cm/s
CHL CUP AV VALUE AREA INDEX: 0.61
CHL CUP AV VEL: 1.53
CHL CUP MV DEC (S): 155
E decel time: 155 msec
E/e' ratio: 15.9
FS: 15 % — AB (ref 28–44)
Height: 64 in
IVS/LV PW RATIO, ED: 0.89
LA diam index: 2.12 cm/m2
LA vol A4C: 114 ml
LASIZE: 53 mm
LAVOL: 130 mL
LAVOLIN: 52 mL/m2
LEFT ATRIUM END SYS DIAM: 53 mm
LV E/e' medial: 15.9
LV PW d: 11.6 mm — AB (ref 0.6–1.1)
LV e' LATERAL: 8.49 cm/s
LVEEAVG: 15.9
LVOT VTI: 37.4 cm
LVOT area: 3.14 cm2
LVOT diameter: 20 mm
LVOT peak grad rest: 10 mmHg
LVOT peak vel: 159 cm/s
LVOTSV: 117 mL
LVOTVTI: 0.49 cm
MV Peak grad: 7 mmHg
MV pk A vel: 48.7 m/s
MVPKEVEL: 135 m/s
P 1/2 time: 242 ms
RV LATERAL S' VELOCITY: 11 cm/s
RV TAPSE: 21.9 mm
TDI e' lateral: 8.49
TDI e' medial: 7.29
VTI: 77 cm
Weight: 5698.1 oz

## 2015-11-29 LAB — GLUCOSE, CAPILLARY
GLUCOSE-CAPILLARY: 96 mg/dL (ref 65–99)
GLUCOSE-CAPILLARY: 79 mg/dL (ref 65–99)
Glucose-Capillary: 95 mg/dL (ref 65–99)
Glucose-Capillary: 84 mg/dL (ref 65–99)
Glucose-Capillary: 92 mg/dL (ref 65–99)

## 2015-11-29 LAB — COMPREHENSIVE METABOLIC PANEL
ALK PHOS: 72 U/L (ref 38–126)
ALT: 11 U/L — AB (ref 14–54)
AST: 19 U/L (ref 15–41)
Albumin: 2.6 g/dL — ABNORMAL LOW (ref 3.5–5.0)
Anion gap: 6 (ref 5–15)
BUN: 62 mg/dL — AB (ref 6–20)
CHLORIDE: 109 mmol/L (ref 101–111)
CO2: 23 mmol/L (ref 22–32)
CREATININE: 2.43 mg/dL — AB (ref 0.44–1.00)
Calcium: 8.9 mg/dL (ref 8.9–10.3)
GFR calc Af Amer: 23 mL/min — ABNORMAL LOW (ref >60)
GFR, EST NON AFRICAN AMERICAN: 20 mL/min — AB (ref >60)
Glucose, Bld: 94 mg/dL (ref 65–99)
Potassium: 3.9 mmol/L (ref 3.5–5.1)
Sodium: 138 mmol/L (ref 135–145)
Total Bilirubin: 1.7 mg/dL — ABNORMAL HIGH (ref 0.3–1.2)
Total Protein: 5.7 g/dL — ABNORMAL LOW (ref 6.5–8.1)

## 2015-11-29 LAB — CBC
HCT: 24.3 % — ABNORMAL LOW (ref 36.0–46.0)
Hemoglobin: 7.5 g/dL — ABNORMAL LOW (ref 12.0–15.0)
MCH: 29.5 pg (ref 26.0–34.0)
MCHC: 30.9 g/dL (ref 30.0–36.0)
MCV: 95.7 fL (ref 78.0–100.0)
PLATELETS: 201 10*3/uL (ref 150–400)
RBC: 2.54 MIL/uL — ABNORMAL LOW (ref 3.87–5.11)
RDW: 18.6 % — AB (ref 11.5–15.5)
WBC: 7 10*3/uL (ref 4.0–10.5)

## 2015-11-29 LAB — MAGNESIUM: Magnesium: 2.2 mg/dL (ref 1.7–2.4)

## 2015-11-29 LAB — PHOSPHORUS: Phosphorus: 4.1 mg/dL (ref 2.5–4.6)

## 2015-11-29 LAB — MRSA PCR SCREENING: MRSA BY PCR: NEGATIVE

## 2015-11-29 LAB — TSH: TSH: 2.092 u[IU]/mL (ref 0.350–4.500)

## 2015-11-29 LAB — TROPONIN I
Troponin I: <0.03 ng/mL (ref <0.03)
Troponin I: <0.03 ng/mL (ref <0.03)

## 2015-11-29 SURGERY — EGD (ESOPHAGOGASTRODUODENOSCOPY)
Anesthesia: Monitor Anesthesia Care

## 2015-11-29 MED ORDER — POLYETHYLENE GLYCOL 3350 17 GM/SCOOP PO POWD: 1.0000 | Freq: Once | ORAL | Status: DC

## 2015-11-29 MED ORDER — PEG 3350-KCL-NA BICARB-NACL 420 G PO SOLR
2000.0000 mL | Freq: Once | ORAL | Status: AC
  Administered 2015-11-29: 2000 mL via ORAL
  Filled 2015-11-29: qty 4000

## 2015-11-29 MED ORDER — BISACODYL 5 MG PO TBEC
5.0000 mg | DELAYED_RELEASE_TABLET | Freq: Once | ORAL | Status: AC
  Administered 2015-11-29: 5 mg via ORAL
  Filled 2015-11-29: qty 1

## 2015-11-29 MED ORDER — POTASSIUM CHLORIDE CRYS ER 20 MEQ PO TBCR
40.0000 meq | EXTENDED_RELEASE_TABLET | Freq: Once | ORAL | Status: AC
  Administered 2015-11-29: 40 meq via ORAL
  Filled 2015-11-29: qty 2

## 2015-11-29 MED ORDER — ONDANSETRON HCL 4 MG/2ML IJ SOLN: 4.0000 mg | Freq: Once | INTRAMUSCULAR | Status: DC | PRN

## 2015-11-29 MED ORDER — FENTANYL CITRATE (PF) 100 MCG/2ML IJ SOLN: 25.0000 ug | INTRAMUSCULAR | Status: DC | PRN

## 2015-11-29 MED ORDER — SODIUM CHLORIDE 0.9 % IV SOLN
INTRAVENOUS | Status: DC
Start: 2015-11-29 — End: 2015-11-29
  Administered 2015-11-29: 1000 mL via INTRAVENOUS

## 2015-11-29 MED ORDER — SODIUM CHLORIDE 0.9% FLUSH: 10.0000 mL | INTRAVENOUS | Status: DC | PRN

## 2015-11-29 MED ORDER — SODIUM CHLORIDE 0.9% FLUSH
10.0000 mL | Freq: Two times a day (BID) | INTRAVENOUS | Status: DC
  Administered 2015-11-29: 20 mL
  Administered 2015-11-30 (×2): 10 mL
  Administered 2015-12-01: 20 mL
  Administered 2015-12-01 – 2015-12-02 (×2): 10 mL
  Administered 2015-12-02: 20 mL
  Administered 2015-12-04 – 2015-12-06 (×3): 10 mL
  Administered 2015-12-07: 20 mL
  Administered 2015-12-07 – 2015-12-11 (×8): 10 mL

## 2015-11-29 MED ORDER — BUTAMBEN-TETRACAINE-BENZOCAINE 2-2-14 % EX AERO
INHALATION_SPRAY | CUTANEOUS | Status: DC | PRN
  Administered 2015-11-29: 2 via TOPICAL

## 2015-11-29 MED ORDER — PROPOFOL 10 MG/ML IV BOLUS
INTRAVENOUS | Status: DC | PRN
  Administered 2015-11-29: 15 mg via INTRAVENOUS
  Administered 2015-11-29: 20 mg via INTRAVENOUS

## 2015-11-29 MED ORDER — PANTOPRAZOLE SODIUM 40 MG PO TBEC
40.0000 mg | DELAYED_RELEASE_TABLET | Freq: Two times a day (BID) | ORAL | Status: DC
  Administered 2015-11-29 – 2015-12-11 (×24): 40 mg via ORAL
  Filled 2015-11-29 (×24): qty 1

## 2015-11-29 MED ORDER — METOPROLOL SUCCINATE ER 25 MG PO TB24
25.0000 mg | ORAL_TABLET | Freq: Every day | ORAL | Status: DC
  Administered 2015-11-29 – 2015-12-11 (×13): 25 mg via ORAL
  Filled 2015-11-29 (×13): qty 1

## 2015-11-29 MED ORDER — PROPOFOL 500 MG/50ML IV EMUL
INTRAVENOUS | Status: DC | PRN
  Administered 2015-11-29: 100 ug/kg/min via INTRAVENOUS

## 2015-11-29 MED ORDER — FUROSEMIDE 10 MG/ML IJ SOLN
80.0000 mg | Freq: Two times a day (BID) | INTRAMUSCULAR | Status: DC
  Administered 2015-11-29 (×2): 80 mg via INTRAVENOUS
  Filled 2015-11-29 (×2): qty 8

## 2015-11-29 NOTE — Consult Note (Addendum)
CARDIOLOGY CONSULT NOTE  Patient ID: Robyn Porter MRN: [id number] DOB/AGE: [birth year] 67 y.o.  Admit date: 11/28/2015 Primary Cardiologist: Aundra Dubin Reason for Consultation: CHF  HPI: 65 yo with history of OHS/OSA, chronic systolic CHF and chronic atrial fibrillation presents for CHF clinic followup.  She was admitted in 2/17 to [facility] with massive volume overload.  She was aggressively diuresed in the hospital and lost around 70 lbs.  She had BRBPR in the hospital, this was thought to be hemorrhoidal bleeding (had colonoscopy).  Echo in 2/17 showed EF 40-45% with mild AS and mild AI.  She was discharged to SNF.  Admitted 09/19/15 - 09/21/15 for upgrade to CRT-P . She was noted to be markedly volume overloaded so HF team took over for diuresis.  Overall diuresed 5.5 L, though weights were thought to be innacurate. (showed up 9 lbs despite good diuresis). Discharge weight 363 lbs.   Recently, she has been treated for Keflex for cellulitis.  Weight has gone up about 10 lbs after decreasing her diuretics for worsening creatinine.  She has had increased edema.  She is not very mobile at baseline.  She was admitted after hemoglobin was noted to have dropped to 7.1, heme+ stool.  She has had 2 units PRBCs since admission.    Review of systems complete and found to be negative unless listed above in HPI  Past Medical History: 1. Asthma 2. OHS/OSA: CPAP, home oxygen. 3. CHF: Chronic systolic CHF.   - Echo ([id number]) with EF 40-45%, mild aortic stenosis, moderate aortic insufficiency.  - Medtronic CRT upgrade 5/17.  4. Aortic valve disorder: Mild aortic stenosis, moderate aortic insufficiency on 2/17 echo. 5. Chronic atrial fibrillation. 6. CKD stage III 7. Lower GI bleeding in 2/17, likely hemorrhoidal.   8. 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     Family History  Problem Relation Age of Onset  . Angina Sister   . Emphysema Sister   . Colon cancer Brother     Social History   Social History  . Marital status: Single    Spouse name: N/A  . Number of children: N/A  . Years of education: N/A   Occupational History  . Not on file.   Social History Main Topics  . Smoking status: Passive Smoke Exposure - Never Smoker  . Smokeless tobacco: Never Used  . Alcohol use No  . Drug use: No  . Sexual activity: Not on file   Other Topics Concern  . Not on file   Social History Narrative  . No narrative on file     Prescriptions Prior to Admission  Medication Sig Dispense Refill Last Dose  . allopurinol (ZYLOPRIM) 100 MG tablet Take 100 mg by mouth daily.   11/28/2015 at Unknown time  . doxycycline (VIBRAMYCIN) 100 MG capsule Take 100 mg by mouth 2 (two) times daily. For 10 days starting 11-27-15   11/28/2015 at Unknown time  . metoprolol succinate (TOPROL-XL) 25 MG 24 hr tablet Take 1 tablet (25 mg total) by mouth 2 (two) times daily. 60 tablet 3 Taking  . potassium chloride SA (K-DUR,KLOR-CON) 20 MEQ tablet Take 1 tablet (20 mEq total) by mouth 2 (two) times daily. Take an extra 40 meq when you take Metolazone 120 tablet 6 11/28/2015 at Unknown time  . torsemide (DEMADEX) 20 MG tablet Take 3 tablets (60 mg total) by mouth 2 (two) times daily. 240 tablet 6 11/28/2015 at Unknown time  . acetaminophen (TYLENOL) 325 MG  tablet Take 1-2 tablets (325-650 mg total) by mouth every 6 (six) hours as needed for mild pain.   Taking  . albuterol (PROVENTIL HFA;VENTOLIN HFA) 108 (90 Base) MCG/ACT inhaler Inhale 1 puff into the lungs every 6 (six) hours as needed for wheezing or shortness of breath.   Taking  . albuterol (PROVENTIL) (2.5 MG/3ML) 0.083% nebulizer solution Take 2.5 mg by nebulization every 8 (eight) hours as needed for wheezing.   0 Taking  . atorvastatin (LIPITOR) 20 MG tablet Take 20 mg by mouth daily.   Taking  . budesonide-formoterol (SYMBICORT) 80-4.5 MCG/ACT inhaler Inhale 2 puffs into the lungs 2 (two) times daily.   Taking  . Cholecalciferol  (VITAMIN D3) 5000 units CAPS Take 1 capsule by mouth daily.   Taking  . cyanocobalamin 500 MCG tablet Take 500 mcg by mouth daily.   Taking  . Exenatide ER (BYDUREON) 2 MG PEN Inject 2 mg into the skin once a week. Saturday   Taking  . ferrous sulfate 325 (65 FE) MG tablet Take 650 mg by mouth 2 (two) times daily with a meal.    Taking  . ipratropium-albuterol (DUONEB) 0.5-2.5 (3) MG/3ML SOLN every 6 (six) hours as needed.  5 3-4 weeks  . loratadine (CLARITIN) 10 MG tablet Take 10 mg by mouth daily.   Taking  . metolazone (ZAROXOLYN) 2.5 MG tablet Take 1 tablet (2.5 mg total) by mouth as needed (3 lb weight gain overnight or 5lb week gain in a week). Every Saturday 10 tablet 3   . Multiple Vitamin (MULTIVITAMIN WITH MINERALS) TABS tablet Take 1 tablet by mouth daily. Reported on 07/09/2015   Taking  . Rivaroxaban (XARELTO) 15 MG TABS tablet Take 1 tablet (15 mg total) by mouth daily with supper. 30 tablet 6 Taking  . vitamin C (ASCORBIC ACID) 500 MG tablet Take 500 mg by mouth daily.   Taking   Current Scheduled Meds: . sodium chloride   Intravenous Once  . acetaminophen  650 mg Oral Once  . allopurinol  100 mg Oral Daily  . atorvastatin  20 mg Oral Daily  . famotidine (PEPCID) IV  20 mg Intravenous Q12H  . furosemide  80 mg Intravenous BID  . insulin aspart  0-9 Units Subcutaneous Q4H  . loratadine  10 mg Oral Daily  . metoprolol succinate  25 mg Oral Daily  . mometasone-formoterol  2 puff Inhalation BID  . potassium chloride  40 mEq Oral Once  . sodium chloride flush  3 mL Intravenous Q12H   Continuous Infusions:  PRN Meds:.acetaminophen **OR** acetaminophen, HYDROcodone-acetaminophen, ipratropium-albuterol, ondansetron **OR** ondansetron (ZOFRAN) IV  Physical exam Blood pressure 114/60, pulse 65, temperature 97.9 F (36.6 C), temperature source Oral, resp. rate 18, height 5\' 4"  (1.626 m), weight (!) 356 lb 2.1 oz (161.5 kg), SpO2 93 %. General: NAD Neck: Thick, JVP difficult but  estimate 12+, no thyromegaly or thyroid nodule.  Lungs: Clear to auscultation bilaterally with normal respiratory effort. CV: Nondisplaced PMI.  Heart regular S1/S2, no [id number], 3/6 systolic crescendo-decrescendo murmur with obscured S2.  1+ edema to thighs.  No carotid bruit.  Normal pedal pulses.  Abdomen: Soft, nontender, no hepatosplenomegaly, no distention.  Skin: Intact without lesions or rashes.  Neurologic: Alert and oriented x 3.  Psych: Normal affect. Extremities: No clubbing or cyanosis.  HEENT: Normal.   Labs:   Lab Results  Component Value Date   WBC 7.0 11/29/2015   HGB 7.5 (L) 11/29/2015   HCT 24.3 (L) 11/29/2015  MCV 95.7 11/29/2015   PLT 201 11/29/2015    Recent Labs Lab 11/29/15 0457  NA 138  K 3.9  CL 109  CO2 23  BUN 62*  CREATININE 2.43*  CALCIUM 8.9  PROT 5.7*  BILITOT 1.7*  ALKPHOS 72  ALT 11*  AST 19  GLUCOSE 94   Lab Results  Component Value Date   TROPONINI <0.03 11/29/2015   Radiology: - CXR: No acute findings  EKG: Atrial fibrillation, BiV pacing   ASSESSMENT AND PLAN: 66 yo with morbid obesity, chronic primarily diastolic CHF (EF [id number]), CKD stage III was admitted with anemia and suspected GI bleeding, also with suspected CHF exacerbation given significant weight gain.  1. GI bleeding/anemia: Heme+.  Has dark stools but also on iron.  Has had periodic hematochezia that she attributes to hemorrhoids.  Has had 2 units PRBCs so far.   - Xarelto on hold.  - Pending GI evaluation.  - Add PPI 2. Acute on chronic primarily diastolic CHF: EF [id number] on last echo.  On exam, does appear volume overloaded and weight is up.  We had decreased her diuretic due to rise in creatinine.   - Volume is difficult to follow, and given elevated creatinine will place a PICC line to follow CVP and avoid over- or under-diuresis. - Can restart her Toprol XL at 25 mg daily, BP is ok.  - CRT device interrogated, she is pacing appropriately (94% BiV pacing).  -  Lasix 80 mg IV bid for now.  - Need daily weights and strict I/Os.  3. CKD: Stage III.  Creatinine at baseline, follow closely with diuresis.  4. Murmur: Has loud systolic murmur in aortic area.  Last echo with mild AS and moderate AI.  I am going to get a repeat echo to look at the aortic valve.  5. Bradycardia: MDT CRT-P device functioning appropriately.  6. Atrial fibrillation: Chronic, off Xarelto for the time being with possible GI bleeding.   Signed: [name] 11/29/2015 10:45 AM

## 2015-11-29 NOTE — Progress Notes (Addendum)
Patient ID: Robyn Porter, female   DOB: [birth year], 65 y.o.   MRN: [id number]  PROGRESS NOTE    Robyn Porter  [id number] DOB: Jan 14, 1951 DOA: 11/28/2015  PCP: Michell Heinrich, DO   Brief Narrative:  65 yo with history of OHS/OSA, chronic systolic and diastolic CHF (last 2 D ECHO in 05/2015 showed EF 45%), atrial fibrillation on xarelto. She was admitted in 2/17 to [facility] with massive volume overload. She was aggressively diuresed in the hospital and lost around 70 lbs. She had BRBPR in the hospital, this was thought to be hemorrhoidal bleeding (had colonoscopy). She was subsequently discharged to SNF. She was admitted 09/19/15 - 09/21/15 for upgrade to CRT-P . She was noted to be markedly volume overloaded so HF team took over for diuresis. Overall diuresed 5.5 L, though weights were thought to be innacurate. Discharge weight was 363 lbs. Her weight has gone up about 10 lbs after decreasing her diuretics for worsening creatinine.    She was admitted for low hemoglobin. On admission, hemoglobin was 7.1. She has been transfused 2 U PRBC so far. GI has seen her in consultation. Plan for EGD today.  Assessment & Plan:   Active Problems:   Acute blood loss anemia / Acute GI bleed / Anemia of chronic kidney disease - Hemoglobin 7.1 on admission. She has chronic anemia due to CKD - Her FOBT was positive on this admission and she had dark stools (noted pt also on iron) - She is on xarelto for a fib and this was stopped due to risk of bleed  - Started protonix today - EGD per GI planned for today  - Check CBC in am    CKD stage 4 - Baseline Cr 2.51 about 1 month ago - Cr on this admission 2.43 - Monitor renal function as pt on high dose IV lasix     Dyslipidemia - Continue statin therapy    OSA (obstructive sleep apnea) / Pulmonary hypertension - CPAP at bedtime    Chronic systolic and diastolic CHF  - Last 2 D ECHO in 05/2015 with EF 40-45%, moderate pulmonary hypertension  -  Appreciate cardio following - Per cardio, volume difficult to follow and given rise in Cr plan to place PICC to follow CVP - CRT device interrogated and pacing appropriately - Per cardio, lasix 80 mg IV BID - Continue daily weight     Chronic atrial fibrillation - CHADS vasc score 5 (HTN, age, gender, CHF, DM) - Off of xarelto due to GI bleed - Rate controlled with BB    Essential hypertension - Continue metoprolol     Morbid obesity due to excess calories with comorbidities (HTN, DM, Dyslipidemia) - Body mass index is 61.13 kg/m. 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  Objective: Vitals:   11/29/15 1358 11/29/15 1405  11/29/15 1410 11/29/15 1420  BP: 104/63 97/66 (!) 113/44 (!) 125/59  Pulse: 65 63 63 65  Resp: 18 15 17 15   Temp:      TempSrc:      SpO2: 99% 95% 95% 96%  Weight:      Height:        Intake/Output Summary (Last 24 hours) at 11/29/15 1604 Last data filed at 11/29/15 1400  Gross per 24 hour  Intake              792 ml  Output              200 ml  Net              592 ml   Filed Weights   11/28/15 2000 11/28/15 2240 11/29/15 0500  Weight: (!) 161 kg (355 lb) (!) 161 kg (354 lb 15.1 oz) (!) 161.5 kg (356 lb 2.1 oz)    Examination:  General exam: Appears calm and comfortable, morbidly obese, bilateral facial flushing  Respiratory system: Clear to auscultation.  Respiratory effort normal. Cardiovascular system: S1 & S2 heard, Rate controlled  Gastrointestinal system: Abdomen is obese, soft and nontender. No organomegaly or masses felt. Normal bowel sounds heard. Central nervous system: Alert and oriented. No focal neurological deficits. Extremities: obese, palpable pulses  Skin: No rashes, lesions or ulcers Psychiatry: Judgement and insight appear normal. Mood & affect appropriate.   Data Reviewed: I have personally reviewed following labs and imaging studies  CBC:  Recent Labs Lab 11/28/15 1551 11/29/15 0457  WBC 7.0 7.0  HGB 7.1* 7.5*  HCT 24.1* 24.3*  MCV 100.0 95.7  PLT 247 [id number]   Basic Metabolic Panel:  Recent Labs Lab 11/28/15 1551 11/29/15 0457  NA 138 138  K 4.2 3.9  CL 106 109  CO2 23 23  GLUCOSE 131* 94  BUN 61* 62*  CREATININE 2.35* 2.43*  CALCIUM 9.4 8.9  MG  --  2.2  PHOS  --  4.1   GFR: Estimated Creatinine Clearance: 35 mL/min (by C-G formula based on SCr of 2.43 mg/dL). Liver Function Tests:  Recent Labs Lab 11/28/15 1551 11/29/15 0457  AST 23 19  ALT 13* 11*  ALKPHOS 84 72  BILITOT 1.1 1.7*  PROT 6.4* 5.7*  ALBUMIN 2.9* 2.6*   No results for input(s): LIPASE, AMYLASE in the last 168 hours. No results for input(s): AMMONIA in the last 168 hours. Coagulation Profile: No results for input(s): INR, PROTIME in the last 168 hours. Cardiac Enzymes:  Recent Labs Lab 11/29/15 0457 11/29/15 1057  TROPONINI <0.03 <0.03   BNP (last 3 results) No results for input(s): PROBNP in the last 8760 hours. HbA1C: No results for input(s): HGBA1C in the last 72 hours. CBG:  Recent Labs Lab 11/28/15 2353 11/29/15 0301 11/29/15 0758 11/29/15 1128  GLUCAP 82 95 96 84   Lipid Profile: No results for input(s): CHOL, HDL, LDLCALC, TRIG, CHOLHDL, LDLDIRECT in the last 72 hours. Thyroid Function Tests:  Recent Labs  11/29/15 0457  TSH 2.092   Anemia Panel: No results for input(s): VITAMINB12, FOLATE,  FERRITIN, TIBC, IRON, RETICCTPCT in the last 72 hours. Urine analysis:    Component Value Date/Time   COLORURINE YELLOW 06/13/2015 0134   APPEARANCEUR CLOUDY (A) 06/13/2015 0134   LABSPEC 1.017 06/13/2015 0134   PHURINE 5.0 06/13/2015 0134   GLUCOSEU NEGATIVE 06/13/2015 0134   HGBUR NEGATIVE 06/13/2015 0134   BILIRUBINUR NEGATIVE 06/13/2015 0134   KETONESUR NEGATIVE 06/13/2015 0134  PROTEINUR 30 (A) 06/13/2015 0134   NITRITE POSITIVE (A) 06/13/2015 0134   LEUKOCYTESUR SMALL (A) 06/13/2015 0134   Sepsis Labs: @LABRCNTIP (procalcitonin:4,lacticidven:4)    Recent Results (from the past 240 hour(s))  MRSA PCR Screening     Status: None   Collection Time: 11/29/15  6:24 AM  Result Value Ref Range Status   MRSA by PCR NEGATIVE NEGATIVE Final      Radiology Studies: Dg Chest 2 View Result Date: 11/28/2015 CLINICAL DATA: No active cardiopulmonary disease. Electronically Signed   By: Marijo Conception, M.D.   On: 11/28/2015 20:10      Scheduled Meds: . sodium chloride   Intravenous Once  . acetaminophen  650 mg Oral Once  . allopurinol  100 mg Oral Daily  . atorvastatin  20 mg Oral Daily  . furosemide  80 mg Intravenous BID  . insulin aspart  0-9 Units Subcutaneous Q4H  . loratadine  10 mg Oral Daily  . metoprolol succinate  25 mg Oral Daily  . mometasone-formoterol  2 puff Inhalation BID  . pantoprazole  40 mg Oral BID  . polyethylene glycol-electrolytes  2,000 mL Oral Once  . sodium chloride flush  3 mL Intravenous Q12H   Continuous Infusions:    LOS: 1 day    Time spent: 25 minutes  Greater than 50% of the time spent on counseling and coordinating the care.   Leisa Lenz, MD [facility] Pager [phone]  If 7PM-7AM, please contact night-coverage [email or web address] Password [email or web address] 11/29/2015, 4:04 PM

## 2015-11-29 NOTE — H&P (View-Only) (Signed)
[facility] Consult: 8:59 AM 11/29/2015  LOS: 1 day    Referring Provider: Dr Charlies Silvers  Primary Care Physician:  ADDIS,DANIEL, DO Primary Gastroenterologist:  Dr. Henrene Pastor    Reason for Consultation:  Progressive anemia and FOBT +   HPI: Tonye Calligan is a 65 y.o. female.  Hx  A fib, on Xarelto.  Non ischemic CM.  CHF, EF 40 -45%.  Bradycardia, s/p pacemaker.  CKD.  Obesity hypoventilation syndrome, sleep apnea, on CPAP.  Type 2 DM.  Takes iron for chronic anemia.  C diff in 04/2015.  Streptococcal cellulitis with bacteremia 02/2015.  Chronic venous stasis with LE edema.    Bleeding PR in 05/2015 felt due to hemorrhoids.   06/29/2015 Colonoscopy: tiny transverse polyp (Path: benign polypoid colonic mucosa), internal hemorrhoids (likely source of bleeding)  Recently took Keflex for cellulitis completed a few days ago, at PMD this week was Rxd with Doxy for same.  Bed bound due to LE edema for ~ 2 weeks. 15# weight gain/2weeks.  Anorexia last week which she attributes to a head cold, now improved.  .  Presented with fatigue but no syncope.  Felt "fuzzyheaded": difficult to think and execute activities.. Hgb 7.1, was 8.3 to 7.8 in 08/2015 during CHF admission.  MCV 95.   FOBT +.  stools always black and some formed, some loose.  Since last week, fewer than normal BMs (usual is daily).  No epistaxis.  Scant BPR last week, per norm of occasional scant BPR.         Past Medical History:  Diagnosis Date  . Afib ([place])   . CHF (congestive heart failure) ([place])   . COPD (chronic obstructive pulmonary disease) ([place])   . Diabetes mellitus without complication ([place])   . Hypertension   . Pacemaker   . Shortness of breath dyspnea     Past Surgical History:  Procedure Laterality Date  . ABDOMINAL HYSTERECTOMY    . BIV  PACEMAKER GENERATOR CHANGE OUT  09/19/2015  . COLONOSCOPY N/A 06/29/2015   Procedure: COLONOSCOPY;  Surgeon: Irene Shipper, MD;  [facility] ENDOSCOPY;  Service: Endoscopy;  Laterality: N/A;  . EP IMPLANTABLE DEVICE N/A 09/19/2015   Procedure: BiV Pacemaker Insertion CRT-P;  Surgeon: Will [name], MD;  Location: [facility] CV LAB;  Service: Cardiovascular;  Laterality: N/A;  . PACEMAKER INSERTION      Prior to Admission medications   Medication Sig Start Date End Date Taking? Authorizing Provider  allopurinol (ZYLOPRIM) 100 MG tablet Take 100 mg by mouth daily.   Yes Historical Provider, MD  doxycycline (VIBRAMYCIN) 100 MG capsule Take 100 mg by mouth 2 (two) times daily. For 10 days starting 11-27-15 11/27/15  Yes Historical Provider, MD  metoprolol succinate (TOPROL-XL) 25 MG 24 hr tablet Take 1 tablet (25 mg total) by mouth 2 (two) times daily. 08/06/15  Yes Larey Dresser, MD  potassium chloride SA (K-DUR,KLOR-CON) 20 MEQ tablet Take 1 tablet (20 mEq total) by mouth 2 (two) times daily. Take an extra 40 meq when you take Metolazone 11/15/15  Yes Satira Mccallum Tillery, PA-C  torsemide (DEMADEX) 20 MG tablet Take 3 tablets (60 mg total) by mouth 2 (two) times daily. 11/15/15  Yes Shirley Friar, PA-C  acetaminophen (TYLENOL) 325 MG tablet Take 1-2 tablets (325-650 mg total) by mouth every 6 (six) hours as needed for mild pain. 09/21/15   Shirley Friar, PA-C  albuterol (PROVENTIL HFA;VENTOLIN HFA) 108 (90 Base) MCG/ACT inhaler Inhale 1 puff into the lungs every 6 (six) hours as needed for wheezing or shortness of breath.    Historical Provider, MD  albuterol (PROVENTIL) (2.5 MG/3ML) 0.083% nebulizer solution Take 2.5 mg by nebulization every 8 (eight) hours as needed for wheezing.  05/22/15   Historical Provider, MD  atorvastatin (LIPITOR) 20 MG tablet Take 20 mg by mouth daily.    Historical Provider, MD  budesonide-formoterol (SYMBICORT) 80-4.5 MCG/ACT inhaler Inhale 2 puffs  into the lungs 2 (two) times daily.    Historical Provider, MD  Cholecalciferol (VITAMIN D3) 5000 units CAPS Take 1 capsule by mouth daily.    Historical Provider, MD  cyanocobalamin 500 MCG tablet Take 500 mcg by mouth daily.    Historical Provider, MD  Exenatide ER (BYDUREON) 2 MG PEN Inject 2 mg into the skin once a week. Saturday    Historical Provider, MD  ferrous sulfate 325 (65 FE) MG tablet Take 650 mg by mouth 2 (two) times daily with a meal.     Historical Provider, MD  ipratropium-albuterol (DUONEB) 0.5-2.5 (3) MG/3ML SOLN every 6 (six) hours as needed. 09/15/15   Historical Provider, MD  loratadine (CLARITIN) 10 MG tablet Take 10 mg by mouth daily.    Historical Provider, MD  metolazone (ZAROXOLYN) 2.5 MG tablet Take 1 tablet (2.5 mg total) by mouth as needed (3 lb weight gain overnight or 5lb week gain in a week). Every Saturday 11/02/15   Shirley Friar, PA-C  Multiple Vitamin (MULTIVITAMIN WITH MINERALS) TABS tablet Take 1 tablet by mouth daily. Reported on 07/09/2015    Historical Provider, MD  Rivaroxaban (XARELTO) 15 MG TABS tablet Take 1 tablet (15 mg total) by mouth daily with supper. 07/23/15   Larey Dresser, MD  vitamin C (ASCORBIC ACID) 500 MG tablet Take 500 mg by mouth daily.    Historical Provider, MD    Scheduled Meds: . sodium chloride   Intravenous Once  . acetaminophen  650 mg Oral Once  . allopurinol  100 mg Oral Daily  . atorvastatin  20 mg Oral Daily  . famotidine (PEPCID) IV  20 mg Intravenous Q12H  . furosemide  80 mg Intravenous BID  . insulin aspart  0-9 Units Subcutaneous Q4H  . loratadine  10 mg Oral Daily  . metoprolol succinate  25 mg Oral Daily  . mometasone-formoterol  2 puff Inhalation BID  . potassium chloride  40 mEq Oral Once  . sodium chloride flush  3 mL Intravenous Q12H   Infusions:   PRN Meds: acetaminophen **OR** acetaminophen, HYDROcodone-acetaminophen, ipratropium-albuterol, ondansetron **OR** ondansetron (ZOFRAN)  IV   Allergies as of 11/28/2015 - Review Complete 11/28/2015  Allergen Reaction Noted  . Adhesive [tape] Other (See Comments) 11/28/2015  . Amoxicillin Hives 06/12/2015  . Contrast media [iodinated diagnostic agents] Hives 06/12/2015  . Shellfish-derived products Hives 07/13/2015  . Benadryl [diphenhydramine hcl] Rash 08/30/2015    Family History  Problem Relation Age of Onset  . Angina Sister   . Emphysema Sister   . Colon cancer Brother     Social History   Social  History  . Marital status: Single    Spouse name: N/A  . Number of children: N/A  . Years of education: N/A   Occupational History  . Not on file.   Social History Main Topics  . Smoking status: Passive Smoke Exposure - Never Smoker  . Smokeless tobacco: Never Used  . Alcohol use No  . Drug use: No  . Sexual activity: Not on file   Other Topics Concern  . Not on file   Social History Narrative  . No narrative on file    REVIEW OF SYSTEMS: Constitutional:  Per HPI ENT:  No nose bleeds Pulm:  Breathing is WNL.  Cough is mostly dry CV:  No palpitations, no LE edema.  GU:  No hematuria, no frequency GI:  Occasionally dry foods get stuck in upper esophageal/throat region.  No dysphagia at [employer].  Denies hx abnormal liver tests Heme:  No significant bleeding or bruising.  Transfusion remotely for menorrhagia leading up to hysto.  Never treated with IV iron.  Transfused in 05/2015 and last night  Neuro:  No headaches, no peripheral tingling or numbness Derm:  No itching, no rash or sores.  Endocrine:  No sweats or chills.  No polyuria or dysuria Immunization:  Not queried Travel:  None beyond local counties in last few months.    PHYSICAL EXAM: Vital signs in last 24 hours: Vitals:   11/29/15 0700 11/29/15 0819  BP: 114/60   Pulse: 65 63  Resp: 18 20  Temp: 97.9 F (36.6 C)    Wt Readings from Last 3 Encounters:  11/29/15 (!) 161.5 kg (356 lb 2.1 oz)  10/25/15 (!) 161 kg (355 lb)  10/05/15  (!) 164.4 kg (362 lb 8 oz)    General: morbidly obese, not ill looking,  comfortable Head:  No asymmetry or signs trauma  Eyes:  No icterus or pallor Ears:  Somewhat HOH  Nose:  congested Mouth:  Moist and clear.  Good dentition Neck:  No mass, no JVD.  Obese.  No JVD Lungs:  Clear in front.  Dry cough.  Some dyspnea with speaking Heart: RRR.  No mrg.  S1/s2 present.  Abdomen:  Obese, NT, BS scant and distent.  soft Rectal: deferred   Musc/Skeltl: no joint contracture or redness Extremities:  LE venous stasis with woody skin changes and some minor erythema, this is not tender  Neurologic:  Oriented x 3.  Fully alert.  Good historian.  Moves all 4s, strength not tested. No tremor Skin:  Stasis dermatitis on LE Tattoos:  None seen   Psych:  Pleasant, cooperative.   Intake/Output from previous day: 08/02 0701 - 08/03 0700 In: 692 [Blood:692] Out: 200 [Urine:200] Intake/Output this shift: No intake/output data recorded.  LAB RESULTS:  Recent Labs  11/28/15 1551 11/29/15 0457  WBC 7.0 7.0  HGB 7.1* 7.5*  HCT 24.1* 24.3*  PLT 247 201   BMET Lab Results  Component Value Date   NA 138 11/29/2015   NA 138 11/28/2015   NA 139 10/25/2015   K 3.9 11/29/2015   K 4.2 11/28/2015   K 4.2 10/25/2015   CL 109 11/29/2015   CL 106 11/28/2015   CL 102 10/25/2015   CO2 23 11/29/2015   CO2 23 11/28/2015   CO2 29 10/25/2015   GLUCOSE 94 11/29/2015   GLUCOSE 131 (H) 11/28/2015   GLUCOSE 109 (H) 10/25/2015   BUN 62 (H) 11/29/2015   BUN 61 (H) 11/28/2015   BUN 60 (  H) 10/25/2015   CREATININE 2.43 (H) 11/29/2015   CREATININE 2.35 (H) 11/28/2015   CREATININE 2.51 (H) 10/25/2015   CALCIUM 8.9 11/29/2015   CALCIUM 9.4 11/28/2015   CALCIUM 9.5 10/25/2015   LFT  Recent Labs  11/28/15 1551 11/29/15 0457  PROT 6.4* 5.7*  ALBUMIN 2.9* 2.6*  AST 23 19  ALT 13* 11*  ALKPHOS 84 72  BILITOT 1.1 1.7*   PT/INR No results found for: INR, PROTIME Hepatitis Panel No results for  input(s): HEPBSAG, HCVAB, HEPAIGM, HEPBIGM in the last 72 hours. C-Diff No components found for: CDIFF Lipase  No results found for: LIPASE  Drugs of Abuse  No results found for: LABOPIA, COCAINSCRNUR, LABBENZ, AMPHETMU, THCU, LABBARB   RADIOLOGY STUDIES: Dg Chest 2 View  Result Date: 11/28/2015 CLINICAL DATA:  Congestion, weakness. EXAM: CHEST  2 VIEW COMPARISON:  Radiographs of Sep 20, 2015. FINDINGS: Mild cardiomegaly is noted. Left-sided pacemaker is unchanged in position. No pneumothorax or pleural effusion is noted. No acute pulmonary disease is noted. Bony thorax is unremarkable. IMPRESSION: No active cardiopulmonary disease. Electronically Signed   By: Marijo Conception, M.D.   On: 11/28/2015 20:10    ENDOSCOPIC STUDIES: Per HPI  IMPRESSION:   *  Chronic anemia.  On po iron at home.  This is likely due to her stage 4 CKD.  She is FOBT +.and has chronically dark stools on po iron  Stools no different recently than usual.   Hgb 1 hour after transfuions completed has not bumped much despite two PRBCs 06/2015 colonoscopy with int rrhoids and small, benign polyp.    *  CHF  *  Chronic Xarelto for a fib. Last dose was PM of 8/2.  Dose of K centra given 2145 last night.    *  Chronic venous stasis, treated with Keflex recently for recurrent cellulitis.    *  Stage 4 CKD.  Has 1st ever appt with a kidney MD coming up in next few weeks.    PLAN:     *  EGD 130 today with  Dr Havery Moros.  Pt npo now.    Azucena Freed  11/29/2015, 8:59 AM Pager: [phone]

## 2015-11-29 NOTE — Progress Notes (Signed)
  Echocardiogram 2D Echocardiogram has been performed.  Robyn Porter 11/29/2015, 4:36 PM

## 2015-11-29 NOTE — Consult Note (Signed)
[facility] Consult: 8:59 AM 11/29/2015  LOS: 1 day    Referring Provider: Dr Charlies Silvers  Primary Care Physician:  ADDIS,DANIEL, DO Primary Gastroenterologist:  Dr. Henrene Pastor    Reason for Consultation:  Progressive anemia and FOBT +   HPI: Robyn Porter is a 65 y.o. female.  Hx  A fib, on Xarelto.  Non ischemic CM.  CHF, EF 40 -45%.  Bradycardia, s/p pacemaker.  CKD.  Obesity hypoventilation syndrome, sleep apnea, on CPAP.  Type 2 DM.  Takes iron for chronic anemia.  C diff in 04/2015.  Streptococcal cellulitis with bacteremia 02/2015.  Chronic venous stasis with LE edema.    Bleeding PR in 05/2015 felt due to hemorrhoids.   06/29/2015 Colonoscopy: tiny transverse polyp (Path: benign polypoid colonic mucosa), internal hemorrhoids (likely source of bleeding)  Recently took Keflex for cellulitis completed a few days ago, at PMD this week was Rxd with Doxy for same.  Bed bound due to LE edema for ~ 2 weeks. 15# weight gain/2weeks.  Anorexia last week which she attributes to a head cold, now improved.  .  Presented with fatigue but no syncope.  Felt "fuzzyheaded": difficult to think and execute activities.. Hgb 7.1, was 8.3 to 7.8 in 08/2015 during CHF admission.  MCV 95.   FOBT +.  stools always black and some formed, some loose.  Since last week, fewer than normal BMs (usual is daily).  No epistaxis.  Scant BPR last week, per norm of occasional scant BPR.         Past Medical History:  Diagnosis Date  . Afib ([place])   . CHF (congestive heart failure) ([place])   . COPD (chronic obstructive pulmonary disease) ([place])   . Diabetes mellitus without complication ([place])   . Hypertension   . Pacemaker   . Shortness of breath dyspnea     Past Surgical History:  Procedure Laterality Date  . ABDOMINAL HYSTERECTOMY    . BIV  PACEMAKER GENERATOR CHANGE OUT  09/19/2015  . COLONOSCOPY N/A 06/29/2015   Procedure: COLONOSCOPY;  Surgeon: Irene Shipper, MD;  Location[facility] ENDOSCOPY;  Service: Endoscopy;  Laterality: N/A;  . EP IMPLANTABLE DEVICE N/A 09/19/2015   Procedure: BiV Pacemaker Insertion CRT-P;  Surgeon: Will [name], MD;  Location: [facility] CV LAB;  Service: Cardiovascular;  Laterality: N/A;  . PACEMAKER INSERTION      Prior to Admission medications   Medication Sig Start Date End Date Taking? Authorizing Provider  allopurinol (ZYLOPRIM) 100 MG tablet Take 100 mg by mouth daily.   Yes Historical Provider, MD  doxycycline (VIBRAMYCIN) 100 MG capsule Take 100 mg by mouth 2 (two) times daily. For 10 days starting 11-27-15 11/27/15  Yes Historical Provider, MD  metoprolol succinate (TOPROL-XL) 25 MG 24 hr tablet Take 1 tablet (25 mg total) by mouth 2 (two) times daily. 08/06/15  Yes Larey Dresser, MD  potassium chloride SA (K-DUR,KLOR-CON) 20 MEQ tablet Take 1 tablet (20 mEq total) by mouth 2 (two) times daily. Take an extra 40 meq when you take Metolazone 11/15/15  Yes Satira Mccallum Tillery, PA-C  torsemide (DEMADEX) 20 MG tablet Take 3 tablets (60 mg total) by mouth 2 (two) times daily. 11/15/15  Yes Shirley Friar, PA-C  acetaminophen (TYLENOL) 325 MG tablet Take 1-2 tablets (325-650 mg total) by mouth every 6 (six) hours as needed for mild pain. 09/21/15   Shirley Friar, PA-C  albuterol (PROVENTIL HFA;VENTOLIN HFA) 108 (90 Base) MCG/ACT inhaler Inhale 1 puff into the lungs every 6 (six) hours as needed for wheezing or shortness of breath.    Historical Provider, MD  albuterol (PROVENTIL) (2.5 MG/3ML) 0.083% nebulizer solution Take 2.5 mg by nebulization every 8 (eight) hours as needed for wheezing.  05/22/15   Historical Provider, MD  atorvastatin (LIPITOR) 20 MG tablet Take 20 mg by mouth daily.    Historical Provider, MD  budesonide-formoterol (SYMBICORT) 80-4.5 MCG/ACT inhaler Inhale 2 puffs  into the lungs 2 (two) times daily.    Historical Provider, MD  Cholecalciferol (VITAMIN D3) 5000 units CAPS Take 1 capsule by mouth daily.    Historical Provider, MD  cyanocobalamin 500 MCG tablet Take 500 mcg by mouth daily.    Historical Provider, MD  Exenatide ER (BYDUREON) 2 MG PEN Inject 2 mg into the skin once a week. Saturday    Historical Provider, MD  ferrous sulfate 325 (65 FE) MG tablet Take 650 mg by mouth 2 (two) times daily with a meal.     Historical Provider, MD  ipratropium-albuterol (DUONEB) 0.5-2.5 (3) MG/3ML SOLN every 6 (six) hours as needed. 09/15/15   Historical Provider, MD  loratadine (CLARITIN) 10 MG tablet Take 10 mg by mouth daily.    Historical Provider, MD  metolazone (ZAROXOLYN) 2.5 MG tablet Take 1 tablet (2.5 mg total) by mouth as needed (3 lb weight gain overnight or 5lb week gain in a week). Every Saturday 11/02/15   Shirley Friar, PA-C  Multiple Vitamin (MULTIVITAMIN WITH MINERALS) TABS tablet Take 1 tablet by mouth daily. Reported on 07/09/2015    Historical Provider, MD  Rivaroxaban (XARELTO) 15 MG TABS tablet Take 1 tablet (15 mg total) by mouth daily with supper. 07/23/15   Larey Dresser, MD  vitamin C (ASCORBIC ACID) 500 MG tablet Take 500 mg by mouth daily.    Historical Provider, MD    Scheduled Meds: . sodium chloride   Intravenous Once  . acetaminophen  650 mg Oral Once  . allopurinol  100 mg Oral Daily  . atorvastatin  20 mg Oral Daily  . famotidine (PEPCID) IV  20 mg Intravenous Q12H  . furosemide  80 mg Intravenous BID  . insulin aspart  0-9 Units Subcutaneous Q4H  . loratadine  10 mg Oral Daily  . metoprolol succinate  25 mg Oral Daily  . mometasone-formoterol  2 puff Inhalation BID  . potassium chloride  40 mEq Oral Once  . sodium chloride flush  3 mL Intravenous Q12H   Infusions:   PRN Meds: acetaminophen **OR** acetaminophen, HYDROcodone-acetaminophen, ipratropium-albuterol, ondansetron **OR** ondansetron (ZOFRAN)  IV   Allergies as of 11/28/2015 - Review Complete 11/28/2015  Allergen Reaction Noted  . Adhesive [tape] Other (See Comments) 11/28/2015  . Amoxicillin Hives 06/12/2015  . Contrast media [iodinated diagnostic agents] Hives 06/12/2015  . Shellfish-derived products Hives 07/13/2015  . Benadryl [diphenhydramine hcl] Rash 08/30/2015    Family History  Problem Relation Age of Onset  . Angina Sister   . Emphysema Sister   . Colon cancer Brother     Social History   Social  History  . Marital status: Single    Spouse name: N/A  . Number of children: N/A  . Years of education: N/A   Occupational History  . Not on file.   Social History Main Topics  . Smoking status: Passive Smoke Exposure - Never Smoker  . Smokeless tobacco: Never Used  . Alcohol use No  . Drug use: No  . Sexual activity: Not on file   Other Topics Concern  . Not on file   Social History Narrative  . No narrative on file    REVIEW OF SYSTEMS: Constitutional:  Per HPI ENT:  No nose bleeds Pulm:  Breathing is WNL.  Cough is mostly dry CV:  No palpitations, no LE edema.  GU:  No hematuria, no frequency GI:  Occasionally dry foods get stuck in upper esophageal/throat region.  No dysphagia at [employer].  Denies hx abnormal liver tests Heme:  No significant bleeding or bruising.  Transfusion remotely for menorrhagia leading up to hysto.  Never treated with IV iron.  Transfused in 05/2015 and last night  Neuro:  No headaches, no peripheral tingling or numbness Derm:  No itching, no rash or sores.  Endocrine:  No sweats or chills.  No polyuria or dysuria Immunization:  Not queried Travel:  None beyond local counties in last few months.    PHYSICAL EXAM: Vital signs in last 24 hours: Vitals:   11/29/15 0700 11/29/15 0819  BP: 114/60   Pulse: 65 63  Resp: 18 20  Temp: 97.9 F (36.6 C)    Wt Readings from Last 3 Encounters:  11/29/15 (!) 161.5 kg (356 lb 2.1 oz)  10/25/15 (!) 161 kg (355 lb)  10/05/15  (!) 164.4 kg (362 lb 8 oz)    General: morbidly obese, not ill looking,  comfortable Head:  No asymmetry or signs trauma  Eyes:  No icterus or pallor Ears:  Somewhat HOH  Nose:  congested Mouth:  Moist and clear.  Good dentition Neck:  No mass, no JVD.  Obese.  No JVD Lungs:  Clear in front.  Dry cough.  Some dyspnea with speaking Heart: RRR.  No mrg.  S1/s2 present.  Abdomen:  Obese, NT, BS scant and distent.  soft Rectal: deferred   Musc/Skeltl: no joint contracture or redness Extremities:  LE venous stasis with woody skin changes and some minor erythema, this is not tender  Neurologic:  Oriented x 3.  Fully alert.  Good historian.  Moves all 4s, strength not tested. No tremor Skin:  Stasis dermatitis on LE Tattoos:  None seen   Psych:  Pleasant, cooperative.   Intake/Output from previous day: 08/02 0701 - 08/03 0700 In: 692 [Blood:692] Out: 200 [Urine:200] Intake/Output this shift: No intake/output data recorded.  LAB RESULTS:  Recent Labs  11/28/15 1551 11/29/15 0457  WBC 7.0 7.0  HGB 7.1* 7.5*  HCT 24.1* 24.3*  PLT 247 201   BMET Lab Results  Component Value Date   NA 138 11/29/2015   NA 138 11/28/2015   NA 139 10/25/2015   K 3.9 11/29/2015   K 4.2 11/28/2015   K 4.2 10/25/2015   CL 109 11/29/2015   CL 106 11/28/2015   CL 102 10/25/2015   CO2 23 11/29/2015   CO2 23 11/28/2015   CO2 29 10/25/2015   GLUCOSE 94 11/29/2015   GLUCOSE 131 (H) 11/28/2015   GLUCOSE 109 (H) 10/25/2015   BUN 62 (H) 11/29/2015   BUN 61 (H) 11/28/2015   BUN 60 (  H) 10/25/2015   CREATININE 2.43 (H) 11/29/2015   CREATININE 2.35 (H) 11/28/2015   CREATININE 2.51 (H) 10/25/2015   CALCIUM 8.9 11/29/2015   CALCIUM 9.4 11/28/2015   CALCIUM 9.5 10/25/2015   LFT  Recent Labs  11/28/15 1551 11/29/15 0457  PROT 6.4* 5.7*  ALBUMIN 2.9* 2.6*  AST 23 19  ALT 13* 11*  ALKPHOS 84 72  BILITOT 1.1 1.7*   PT/INR No results found for: INR, PROTIME Hepatitis Panel No results for  input(s): HEPBSAG, HCVAB, HEPAIGM, HEPBIGM in the last 72 hours. C-Diff No components found for: CDIFF Lipase  No results found for: LIPASE  Drugs of Abuse  No results found for: LABOPIA, COCAINSCRNUR, LABBENZ, AMPHETMU, THCU, LABBARB   RADIOLOGY STUDIES: Dg Chest 2 View  Result Date: 11/28/2015 CLINICAL DATA:  Congestion, weakness. EXAM: CHEST  2 VIEW COMPARISON:  Radiographs of Sep 20, 2015. FINDINGS: Mild cardiomegaly is noted. Left-sided pacemaker is unchanged in position. No pneumothorax or pleural effusion is noted. No acute pulmonary disease is noted. Bony thorax is unremarkable. IMPRESSION: No active cardiopulmonary disease. Electronically Signed   By: Marijo Conception, M.D.   On: 11/28/2015 20:10    ENDOSCOPIC STUDIES: Per HPI  IMPRESSION:   *  Chronic anemia.  On po iron at home.  This is likely due to her stage 4 CKD.  She is FOBT +.and has chronically dark stools on po iron  Stools no different recently than usual.   Hgb 1 hour after transfuions completed has not bumped much despite two PRBCs 06/2015 colonoscopy with int rrhoids and small, benign polyp.    *  CHF  *  Chronic Xarelto for a fib. Last dose was PM of 8/2.  Dose of K centra given 2145 last night.    *  Chronic venous stasis, treated with Keflex recently for recurrent cellulitis.    *  Stage 4 CKD.  Has 1st ever appt with a kidney MD coming up in next few weeks.    PLAN:     *  EGD 130 today with  Dr Havery Moros.  Pt npo now.    Azucena Freed  11/29/2015, 8:59 AM Pager: [phone]

## 2015-11-29 NOTE — Interval H&P Note (Signed)
History and Physical Interval Note:  11/29/2015 1:32 PM  Robyn Porter  has presented today for surgery, with the diagnosis of anemia, FOBT +  The various methods of treatment have been discussed with the patient and family. After consideration of risks, benefits and other options for treatment, the patient has consented to  Procedure(s): ESOPHAGOGASTRODUODENOSCOPY (EGD) (N/A) as a surgical intervention .  The patient's history has been reviewed, patient examined, no change in status, stable for surgery.  I have reviewed the patient's chart and labs.  Questions were answered to the patient's satisfaction.     [name]

## 2015-11-29 NOTE — Anesthesia Preprocedure Evaluation (Signed)
Anesthesia Evaluation  Patient identified by MRN, date of birth, ID band Patient awake    Reviewed: Allergy & Precautions, NPO status , Patient's Chart, lab work & pertinent test results, reviewed documented beta blocker date and time   History of Anesthesia Complications Negative for: history of anesthetic complications  Airway Mallampati: II  TM Distance: >3 FB Neck ROM: Full    Dental  (+) Teeth Intact, Dental Advisory Given   Pulmonary asthma , sleep apnea and Continuous Positive Airway Pressure Ventilation , COPD,  COPD inhaler,    Pulmonary exam normal breath sounds clear to auscultation       Cardiovascular hypertension, Pt. on home beta blockers and Pt. on medications +CHF  + dysrhythmias Atrial Fibrillation + pacemaker + Valvular Problems/Murmurs AS and AI  Rhythm:Regular Rate:Normal + Systolic murmurs Paced  Echo in 2/17 showed EF 40-45% with mild AS and mild AI.    Neuro/Psych negative neurological ROS  negative psych ROS   GI/Hepatic negative GI ROS, Neg liver ROS,   Endo/Other  diabetes, Type 2Morbid obesity  Renal/GU Renal InsufficiencyRenal disease     Musculoskeletal negative musculoskeletal ROS (+)   Abdominal   Peds  Hematology  (+) Blood dyscrasia, anemia ,   Anesthesia Other Findings Day of surgery medications reviewed with the patient.  Reproductive/Obstetrics negative OB ROS                             Anesthesia Physical Anesthesia Plan  ASA: IV  Anesthesia Plan: MAC   Post-op Pain Management:    Induction: Intravenous  Airway Management Planned: Nasal Cannula  Additional Equipment:   Intra-op Plan:   Post-operative Plan:   Informed Consent: I have reviewed the patients History and Physical, chart, labs and discussed the procedure including the risks, benefits and alternatives for the proposed anesthesia with the patient or authorized representative  who has indicated his/her understanding and acceptance.   Dental advisory given  Plan Discussed with: CRNA and Anesthesiologist  Anesthesia Plan Comments: (Discussed risks/benefits/alternatives to MAC sedation including need for ventilatory support, hypotension, need for conversion to general anesthesia.  All patient questions answered.  Patient/guardian wishes to proceed.)        Anesthesia Quick Evaluation

## 2015-11-29 NOTE — Telephone Encounter (Signed)
Received note from [facility], pt is sch to see Dr Hollie Salk on 8/16 at 2:30 pm

## 2015-11-29 NOTE — Op Note (Signed)
[facility] Patient Name: Robyn Porter Procedure Date : 11/29/2015 MRN: [id number] Attending MD: Carlota Raspberry. Havery Moros , MD Date of Birth: [birth year] CSN: [id number] Age: 65 Admit Type: Inpatient Procedure:                Upper GI endoscopy Indications:              Progressive anemia in the setting of                            anticoagulation, concerned secondary to chronic                            blood loss. Colonoscopy in March normal other than                            hemorrhoids. On iron Providers:                Carlota Raspberry. Havery Moros, MD, Sarah Monday RN, RN,                            William Dalton, Technician Referring MD:              Medicines:                Monitored Anesthesia Care Complications:            No immediate complications. Estimated blood loss:                            Minimal. Estimated Blood Loss:     Estimated blood loss was minimal. Procedure:                Pre-Anesthesia Assessment:                           - Prior to the procedure, a History and Physical                            was performed, and patient medications and                            allergies were reviewed. The patient's tolerance of                            previous anesthesia was also reviewed. The risks                            and benefits of the procedure and the sedation                            options and risks were discussed with the patient.                            All questions were answered, and informed consent                            was  obtained. Prior Anticoagulants: The patient has                            taken Xarelto (rivaroxaban), last dose was 2 days                            prior to procedure. ASA Grade Assessment: III - A                            patient with severe systemic disease. After                            reviewing the risks and benefits, the patient was                            deemed in satisfactory  condition to undergo the                            procedure.                           After obtaining informed consent, the endoscope was                            passed under direct vision. Throughout the                            procedure, the patient's blood pressure, pulse, and                            oxygen saturations were monitored continuously. The                            [id number]) scope was introduced through the                            mouth, and advanced to the third part of duodenum.                            The upper GI endoscopy was accomplished without                            difficulty. The patient tolerated the procedure                            well. Scope In: Scope Out: Findings:      Esophagogastric landmarks were identified: the Z-line was found at 43       cm, the gastroesophageal junction was found at 43 cm and the upper       extent of the gastric folds was found at 43 cm from the incisors.      The exam of the esophagus was otherwise normal.      The entire examined stomach was normal. When suctioning the fundus there       was a "  suction hickey" noted where the mucosa was friable (noted in case       patient has capsule study)      The duodenal bulb and second portion of the duodenum were normal. Upon       slow withdrawal in the duodenal bulb / sweep there was some red spots       from endoscopic trauma, noted as they will likely be seen during capsule       study if performed. Impression:               - Esophagogastric landmarks identified.                           - Normal esophagus                           - Normal stomach.                           - Normal duodenal bulb and second portion of the                            duodenum.                           - No specimens collected.                           Overall, no pathology noted to account for the                            patient's anemia. Recommend capsule  endoscopy to                            clear the small bowel. I will discuss timing and if                            she wishes to have this done while inpatient. Moderate Sedation:      No moderate sedation, case performed with MAC Recommendation:           - Return patient to hospital ward for ongoing care.                           - Clear liquid diet.                           - Continue present medications.                           - Will discuss capsule study with patient. If she                            wishes to proceed with this, she will need bowel                            prep tonight with capsule placement tomorrow.                           -  Trend Hgb Procedure Code(s):        --- Professional ---                           [phone], Esophagogastroduodenoscopy, flexible,                            transoral; diagnostic, including collection of                            specimen(s) by brushing or washing, when performed                            (separate procedure) Diagnosis Code(s):        --- Professional ---                           D50.0, Iron deficiency anemia secondary to blood                            loss (chronic) CPT copyright 2016 American Medical Association. All rights reserved. The codes documented in this report are preliminary and upon coder review may  be revised to meet current compliance requirements. Remo Lipps P. Armbruster, MD 11/29/2015 2:05:38 PM This report has been signed electronically. Number of Addenda: 0

## 2015-11-29 NOTE — Anesthesia Procedure Notes (Signed)
Procedure Name: MAC Date/Time: 11/29/2015 1:35 PM Performed by: Salli Quarry Timtohy Broski Pre-anesthesia Checklist: Patient identified, Emergency Drugs available, Suction available, Patient being monitored and Timeout performed Oxygen Delivery Method: Nasal cannula

## 2015-11-29 NOTE — Progress Notes (Signed)
Peripherally Inserted Central Catheter/Midline Placement  The IV Nurse has discussed with the patient and/or persons authorized to consent for the patient, the purpose of this procedure and the potential benefits and risks involved with this procedure.  The benefits include less needle sticks, lab draws from the catheter, ability to perform PICC exchange if ordered by the physician and patient may be discharged home with the catheter.  Risks include, but not limited to, infection, bleeding, blood clot (thrombus formation), and puncture of an artery; nerve damage and irregular heat beat.  Alternatives to this procedure were also discussed.  Bard educational packet given to pt.  PICC/Midline Placement Documentation  PICC Double Lumen 11/29/15 PICC Right Cephalic 39 cm 0 cm (Active)  Indication for Insertion or Continuance of Line Chronic illness with exacerbations (CF, Sickle Cell, etc.);Prolonged intravenous therapies 11/29/2015  7:01 PM  Exposed Catheter (cm) 0 cm 11/29/2015  7:01 PM  Site Assessment Clean;Dry;Intact 11/29/2015  7:01 PM  Lumen #1 Status Flushed;Saline locked;Blood return noted 11/29/2015  7:01 PM  Lumen #2 Status Flushed;Saline locked;Blood return noted 11/29/2015  7:01 PM  Dressing Type Transparent 11/29/2015  7:01 PM  Dressing Status Clean;Dry;Intact;Antimicrobial disc in place 11/29/2015  7:01 PM  Line Care Connections checked and tightened 11/29/2015  7:01 PM  Line Adjustment (NICU/IV Team Only) No 11/29/2015  7:01 PM  Dressing Intervention New dressing 11/29/2015  7:01 PM  Dressing Change Due 12/06/15 11/29/2015  7:01 PM       Rolena Infante 11/29/2015, 7:02 PM

## 2015-11-29 NOTE — Evaluation (Signed)
Physical Therapy Evaluation Patient Details Name: Robyn Porter MRN: [id number] DOB: Apr 15, 1951 Today's Date: 11/29/2015   History of Present Illness  Robyn Porter is a 65 y.o. female with medical history significant of OHS/OSA, chronic systolic CHF and chronic atrial fibrillation on Xarelto. Found to have: worsening anemia and ascending of fluid overload recent cellulitis Hemoccult positive stools in the setting of chronic melena  Clinical Impression  Patient demonsratates deficits in functional mobility as indicated below. Will need continued skilled PT to address deficits and maximize function. WIil see as indicated and progress as tolerated. Recommend ST SNF upon acute discharge HOWEVER, if patient progresses well, may consider HHPT.    Follow Up Recommendations SNF (HHPT if progressing well)    Equipment Recommendations  None recommended by PT    Recommendations for Other Services       Precautions / Restrictions Precautions Precautions: Fall Restrictions Weight Bearing Restrictions: No      Mobility  Bed Mobility Overal bed mobility: Modified Independent             General bed mobility comments: HOB up and use of rail  Transfers Overall transfer level: Needs assistance Equipment used: 1 person hand held assist Transfers: Sit to/from Stand Sit to Stand: Min assist;+2 safety/equipment         General transfer comment: +2 min HHA to step sideways to get up towards [facility]  Ambulation/Gait             General Gait Details: deferred ambulation at this time due to fatigue  Stairs            Wheelchair Mobility    Modified Rankin (Stroke Patients Only)       Balance Overall balance assessment: Needs assistance Sitting-balance support: No upper extremity supported Sitting balance-Leahy Scale: Good     Standing balance support: Single extremity supported Standing balance-Leahy Scale: Poor                                Pertinent Vitals/Pain Pain Assessment: Faces Faces Pain Scale: Hurts a little bit Pain Location: RLE painful  Pain Descriptors / Indicators: Sore Pain Intervention(s): Limited activity within patient's tolerance    Home Living Family/patient expects to be discharged to:: Private residence Living Arrangements: Other relatives;Other (Comment) Available Help at Discharge: Friend(s) Type of Home: House Home Access: Stairs to enter;Ramped entrance   Entrance Stairs-Number of Steps: ramp for entering house Home Layout: Able to live on main level with bedroom/bathroom Home Equipment: Walker - 2 wheels;Walker - 4 wheels;Cane - single point;Wheelchair - manual Additional Comments: 4ww to walk in house from bedroom to kitchen , uses a cane in the bathroom    Prior Function Level of Independence: Independent with assistive device(s)         Comments: on home O2     Hand Dominance   Dominant Hand: Right    Extremity/Trunk Assessment   Upper Extremity Assessment: Overall WFL for tasks assessed           Lower Extremity Assessment: Generalized weakness      Cervical / Trunk Assessment:  (increased body habitus)  Communication   Communication: No difficulties  Cognition Arousal/Alertness: Awake/alert Behavior During Therapy: WFL for tasks assessed/performed Overall Cognitive Status: Within Functional Limits for tasks assessed                      General Comments  Exercises        Assessment/Plan    PT Assessment Patient needs continued PT services  PT Diagnosis Difficulty walking;Abnormality of gait;Generalized weakness   PT Problem List Decreased strength;Decreased range of motion;Decreased activity tolerance;Decreased balance;Decreased mobility;Cardiopulmonary status limiting activity;Obesity  PT Treatment Interventions DME instruction;Gait training;Functional mobility training;Therapeutic activities;Therapeutic exercise;Balance  training;Patient/family education   PT Goals (Current goals can be found in the Care Plan section) Acute Rehab PT Goals Patient Stated Goal: to be able to go home and go on a vacation she has planned for September PT Goal Formulation: With patient Time For Goal Achievement: 12/13/15 Potential to Achieve Goals: Good    Frequency Min 3X/week   Barriers to discharge        Co-evaluation PT/OT/SLP Co-Evaluation/Treatment: Yes Reason for Co-Treatment: For patient/therapist safety PT goals addressed during session: Mobility/safety with mobility OT goals addressed during session: Strengthening/ROM       End of Session   Activity Tolerance: Patient tolerated treatment well;Patient limited by fatigue Patient left: in bed;with call bell/phone within reach Nurse Communication: Mobility status         Time: 1012-1028 PT Time Calculation (min) (ACUTE ONLY): 16 min   Charges:   PT Evaluation $PT Eval Moderate Complexity: 1 Procedure     PT G CodesDuncan Dull 12/10/15, 3:59 PM Alben Deeds, [place] DPT  [phone]

## 2015-11-29 NOTE — Evaluation (Signed)
Occupational Therapy Evaluation Patient Details Name: Robyn Porter MRN: [id number] DOB: 1950-06-23 Today's Date: 11/29/2015    History of Present Illness [name] is a 65 y.o. female with medical history significant of OHS/OSA, chronic systolic CHF and chronic atrial fibrillation on Xarelto. Found to have: worsening anemia and ascending of fluid overload recent cellulitis Hemoccult positive stools in the setting of chronic melena   Clinical Impression   This 65 yo female admitted with above presents to acute OT with deficits below (see OT problem list) thus affecting her PLOF of Mod I with basic ADLs. She will benefit from acute OT with follow up OT at SNF [facility] if progresses more quickly)    Follow Up Recommendations  SNF;Other (comment) (HHOT if progresses quickly)    Equipment Recommendations  None recommended by OT       Precautions / Restrictions Precautions Precautions: Fall Restrictions Weight Bearing Restrictions: No      Mobility Bed Mobility Overal bed mobility: Modified Independent             General bed mobility comments: HOB up and use of rail  Transfers Overall transfer level: Needs assistance Equipment used: 1 person hand held assist Transfers: Sit to/from Stand Sit to Stand: Min assist;+2 safety/equipment         General transfer comment: +2 min HHA to step sideways to get up towards [facility]    Balance Overall balance assessment: Needs assistance Sitting-balance support: No upper extremity supported;Feet supported Sitting balance-Leahy Scale: Good     Standing balance support: Single extremity supported Standing balance-Leahy Scale: Poor                              ADL Overall ADL's : Needs assistance/impaired Eating/Feeding: Independent;Sitting   Grooming: Set up;Sitting   Upper Body Bathing: Set up;Supervision/ safety;Sitting   Lower Body Bathing: Maximal assistance (min A sit<>stand )   Upper Body Dressing :  Set up;Sitting   Lower Body Dressing: Maximal assistance (min A sit<>stand )   Toilet Transfer: Minimal assistance;Stand-pivot   Toileting- Clothing Manipulation and Hygiene: Maximal assistance (min A sit<>stand )                         Pertinent Vitals/Pain Pain Assessment: Faces Faces Pain Scale: Hurts a little bit Pain Location: RLE--has been giving her issues pta Pain Descriptors / Indicators: Sore Pain Intervention(s): Limited activity within patient's tolerance;Repositioned     Hand Dominance     Extremity/Trunk Assessment Upper Extremity Assessment Upper Extremity Assessment: Overall WFL for tasks assessed           Communication     Cognition Arousal/Alertness: Awake/alert Behavior During Therapy: WFL for tasks assessed/performed Overall Cognitive Status: Within Functional Limits for tasks assessed                                Home Living Family/patient expects to be discharged to:: Private residence Living Arrangements: Other relatives;Other (Comment) Available Help at Discharge: Friend(s) Type of Home: House Home Access: Stairs to enter [employer] of Steps: ramp for entering house   Home Layout: Two level     Bathroom Shower/Tub: Hospital doctor Toilet: Handicapped height     Home Equipment: Environmental consultant - 2 wheels;Walker - 4 wheels;Cane - single point;Wheelchair - manual   Additional Comments: 4ww to walk in house from  bedroom to kitchen , uses a cane in the bathroom      Prior Functioning/Environment Level of Independence: Independent with assistive device(s)             OT Diagnosis: Generalized weakness;Acute pain   OT Problem List: Decreased strength;Impaired balance (sitting and/or standing);Obesity;Pain   OT Treatment/Interventions: Self-care/ADL training;Balance training;DME and/or AE instruction;Therapeutic activities;Patient/family education    OT Goals(Current goals can be found in the  care plan section) Acute Rehab OT Goals Patient Stated Goal: to be able to go home and go on a vacation she has planned for September OT Goal Formulation: With patient Time For Goal Achievement: 12/06/15 Potential to Achieve Goals: Good  OT Frequency: Min 2X/week           Co-evaluation PT/OT/SLP Co-Evaluation/Treatment: Yes Reason for Co-Treatment: For patient/therapist safety   OT goals addressed during session: Strengthening/ROM      End of Session Nurse Communication: Mobility status;Other (comment) (need for wide recliner (not a bariatric one))  Activity Tolerance: Patient tolerated treatment well Patient left: in bed;with call bell/phone within reach   Time: 1012-1036 OT Time Calculation (min): 24 min Charges:  OT General Charges $OT Visit: 1 Procedure OT Evaluation $OT Eval Moderate Complexity: 1 Procedure  [name] [id number] 11/29/2015, 12:55 PM

## 2015-11-29 NOTE — Transfer of Care (Signed)
Immediate Anesthesia Transfer of Care Note  Patient: Robyn Porter  Procedure(s) Performed: Procedure(s): ESOPHAGOGASTRODUODENOSCOPY (EGD) (N/A)  Patient Location: Endoscopy Unit  Anesthesia Type:MAC  Level of Consciousness: awake, alert , oriented and patient cooperative  Airway & Oxygen Therapy: Patient Spontanous Breathing and Patient connected to nasal cannula oxygen  Post-op Assessment: Report given to RN and Post -op Vital signs reviewed and stable  Post vital signs: Reviewed and stable  Last Vitals:  Vitals:   11/29/15 1251 11/29/15 1253  BP:  (!) 126/44  Pulse:    Resp: 18   Temp: 36.6 C     Last Pain:  Vitals:   11/29/15 1251  TempSrc: Oral         Complications: No apparent anesthesia complications

## 2015-11-30 ENCOUNTER — Encounter (HOSPITAL_COMMUNITY)
Admission: EM | Disposition: A | Discharge status: Home Health | Payer: Self-pay | Source: Home / Self Care | Attending: Internal Medicine

## 2015-11-30 DIAGNOSIS — E785 Hyperlipidemia, unspecified: Secondary | ICD-10-CM | POA: Diagnosis present

## 2015-11-30 DIAGNOSIS — I5033 Acute on chronic diastolic (congestive) heart failure: Secondary | ICD-10-CM

## 2015-11-30 HISTORY — PX: GIVENS CAPSULE STUDY: SHX5432

## 2015-11-30 LAB — BASIC METABOLIC PANEL
ANION GAP: 10 (ref 5–15)
BUN: 56 mg/dL — ABNORMAL HIGH (ref 6–20)
CO2: 24 mmol/L (ref 22–32)
Calcium: 9.1 mg/dL (ref 8.9–10.3)
Chloride: 104 mmol/L (ref 101–111)
Creatinine, Ser: 2.31 mg/dL — ABNORMAL HIGH (ref 0.44–1.00)
GFR calc Af Amer: 24 mL/min — ABNORMAL LOW (ref >60)
GFR, EST NON AFRICAN AMERICAN: 21 mL/min — AB (ref >60)
GLUCOSE: 72 mg/dL (ref 65–99)
POTASSIUM: 3.6 mmol/L (ref 3.5–5.1)
Sodium: 138 mmol/L (ref 135–145)

## 2015-11-30 LAB — TYPE AND SCREEN
ABO/RH(D): B POS
ANTIBODY SCREEN: NEGATIVE
UNIT DIVISION: 0
Unit division: 0

## 2015-11-30 LAB — GLUCOSE, CAPILLARY
GLUCOSE-CAPILLARY: 116 mg/dL — AB (ref 65–99)
GLUCOSE-CAPILLARY: 69 mg/dL (ref 65–99)
GLUCOSE-CAPILLARY: 73 mg/dL (ref 65–99)
GLUCOSE-CAPILLARY: 75 mg/dL (ref 65–99)
Glucose-Capillary: 72 mg/dL (ref 65–99)
Glucose-Capillary: 98 mg/dL (ref 65–99)

## 2015-11-30 LAB — CBC
HEMATOCRIT: 27.2 % — AB (ref 36.0–46.0)
HEMOGLOBIN: 8 g/dL — AB (ref 12.0–15.0)
MCH: 28.8 pg (ref 26.0–34.0)
MCHC: 29.4 g/dL — AB (ref 30.0–36.0)
MCV: 97.8 fL (ref 78.0–100.0)
Platelets: 197 10*3/uL (ref 150–400)
RBC: 2.78 MIL/uL — ABNORMAL LOW (ref 3.87–5.11)
RDW: 19.3 % — ABNORMAL HIGH (ref 11.5–15.5)
WBC: 4.3 10*3/uL (ref 4.0–10.5)

## 2015-11-30 LAB — HEMOGLOBIN A1C
HEMOGLOBIN A1C: 5.1 % (ref 4.8–5.6)
Mean Plasma Glucose: 100 mg/dL

## 2015-11-30 SURGERY — IMAGING PROCEDURE, GI TRACT, INTRALUMINAL, VIA CAPSULE

## 2015-11-30 MED ORDER — DEXTROSE 50 % IV SOLN
INTRAVENOUS | Status: AC
  Filled 2015-11-30: qty 50

## 2015-11-30 MED ORDER — FUROSEMIDE 10 MG/ML IJ SOLN
80.0000 mg | Freq: Once | INTRAMUSCULAR | Status: AC
  Administered 2015-11-30: 80 mg via INTRAVENOUS
  Filled 2015-11-30: qty 8

## 2015-11-30 MED ORDER — METOLAZONE 2.5 MG PO TABS
2.5000 mg | ORAL_TABLET | Freq: Once | ORAL | Status: AC
  Administered 2015-11-30: 2.5 mg via ORAL
  Filled 2015-11-30: qty 1

## 2015-11-30 MED ORDER — POTASSIUM CHLORIDE CRYS ER 20 MEQ PO TBCR
40.0000 meq | EXTENDED_RELEASE_TABLET | Freq: Two times a day (BID) | ORAL | Status: DC
  Administered 2015-11-30 – 2015-12-10 (×22): 40 meq via ORAL
  Filled 2015-11-30 (×22): qty 2

## 2015-11-30 MED ORDER — DEXTROSE 50 % IV SOLN
25.0000 mL | Freq: Once | INTRAVENOUS | Status: AC
  Administered 2015-11-30: 25 mL via INTRAVENOUS

## 2015-11-30 MED ORDER — FUROSEMIDE 10 MG/ML IJ SOLN
20.0000 mg/h | INTRAVENOUS | Status: AC
  Administered 2015-11-30 – 2015-12-06 (×6): 12 mg/h via INTRAVENOUS
  Filled 2015-11-30 (×17): qty 25

## 2015-11-30 SURGICAL SUPPLY — 1 items: TOWEL COTTON PACK 4EA (MISCELLANEOUS) ×6 IMPLANT

## 2015-11-30 NOTE — Progress Notes (Signed)
Daily Rounding Note  11/30/2015, 1:10 PM  LOS: 2 days   SUBJECTIVE:   Chief complaint: fatigue  Not seen today.    OBJECTIVE:         Vital signs in last 24 hours:    Temp:  [97.6 F (36.4 C)-97.9 F (36.6 C)] 97.6 F (36.4 C) (08/04 1158) Pulse Rate:  [60-68] 62 (08/04 1158) Resp:  [14-18] 16 (08/04 1158) BP: (97-136)/(44-81) 128/55 (08/04 1158) SpO2:  [95 %-100 %] 100 % (08/04 1158) FiO2 (%):  [21 %] 21 % (08/03 1927) Weight:  [159.7 kg (352 lb)-160.1 kg (352 lb 15.3 oz)] 159.7 kg (352 lb) (08/04 0953) Last BM Date: 11/30/15 Filed Weights   11/29/15 0500 11/30/15 0445 11/30/15 0953  Weight: (!) 161.5 kg (356 lb 2.1 oz) (!) 160.1 kg (352 lb 15.3 oz) (!) 159.7 kg (352 lb)   Did not examine pt   Intake/Output from previous day: 08/03 0701 - 08/04 0700 In: 2100 [P.O.:2000; I.V.:100] Out: 500 [Urine:500]  Intake/Output this shift: Total I/O In: 339.2 [P.O.:330; I.V.:9.2] Out: 450 [Urine:450]  Lab Results:  Recent Labs  11/28/15 1551 11/29/15 0457 11/30/15 0507  WBC 7.0 7.0 4.3  HGB 7.1* 7.5* 8.0*  HCT 24.1* 24.3* 27.2*  PLT 247 201 197   BMET  Recent Labs  11/28/15 1551 11/29/15 0457 11/30/15 0507  NA 138 138 138  K 4.2 3.9 3.6  CL 106 109 104  CO2 23 23 24   GLUCOSE 131* 94 72  BUN 61* 62* 56*  CREATININE 2.35* 2.43* 2.31*  CALCIUM 9.4 8.9 9.1   LFT  Recent Labs  11/28/15 1551 11/29/15 0457  PROT 6.4* 5.7*  ALBUMIN 2.9* 2.6*  AST 23 19  ALT 13* 11*  ALKPHOS 84 72  BILITOT 1.1 1.7*   PT/INR No results for input(s): LABPROT, INR in the last 72 hours. Hepatitis Panel No results for input(s): HEPBSAG, HCVAB, HEPAIGM, HEPBIGM in the last 72 hours.  Studies/Results: Dg Chest 2 View  Result Date: 11/28/2015 CLINICAL DATA:  Congestion, weakness. EXAM: CHEST  2 VIEW COMPARISON:  Radiographs of Sep 20, 2015. FINDINGS: Mild cardiomegaly is noted. Left-sided pacemaker is unchanged in  position. No pneumothorax or pleural effusion is noted. No acute pulmonary disease is noted. Bony thorax is unremarkable. IMPRESSION: No active cardiopulmonary disease. Electronically Signed   By: Marijo Conception, M.D.   On: 11/28/2015 20:10   Dg Chest Port 1 View  Result Date: 11/29/2015 CLINICAL DATA:  Right PICC placement. EXAM: PORTABLE CHEST 1 VIEW COMPARISON:  11/28/2015. FINDINGS: Interval right PICC extending into the inferior aspect of the superior vena cava. The tip is poorly visualized and appears to be within the inferior aspect of the superior vena cava. Stable enlarged cardiac silhouette and left subclavian pacemaker leads. The pulmonary vasculature and interstitial markings remain mildly prominent. Thoracic spine degenerative changes. IMPRESSION: 1. Right PICC tip in the inferior aspect of the superior vena cava. If a position at the superior cavoatrial junction is desired, it would be recommended that this be advanced 2 cm. 2. Stable cardiomegaly, mild pulmonary vascular congestion and mild chronic interstitial lung disease Electronically Signed   By: Claudie Revering M.D.   On: 11/29/2015 19:59    ASSESMENT:   *  Chronic anemia.  Due to CKD 4.  On chronic po iron.  S/p PRBC x 2.  Hgb did not bump much but stable. 11/29/15 EGD: normal study to D 2.  06/2015 Colonoscopy: tiny transverse polyp (Path: benign polypoid colonic mucosa), internal hemorrhoids (likely source of bleeding PR) 11/30/15 Capsule endo in proress  *  Chronic Xarelto for A fib.   *  Chronic venous stasis and recurrent cellulitis      PLAN   *  Complete capsule endo and hopefully read this tomorrow.      Azucena Freed  11/30/2015, 1:10 PM Pager: [phone]

## 2015-11-30 NOTE — Progress Notes (Signed)
Pt has been unable to walk in hall in the past admissions. Not appropriate for CR at this time. Yves Dill CES, ACSM 2:43 PM 11/30/2015

## 2015-11-30 NOTE — Progress Notes (Addendum)
Patient ID: Robyn Porter, female   DOB: [birth year], 65 y.o.   MRN: [id number]  PROGRESS NOTE    Berdean Thielemann  [id number] DOB: 14-May-1950 DOA: 11/28/2015  PCP: Michell Heinrich, DO   Brief Narrative:  65 yo with history of OHS/OSA, chronic systolic and diastolic CHF (last 2 D ECHO in 05/2015 showed EF 45%), atrial fibrillation on xarelto. She was admitted in 2/17 to [facility] with massive volume overload. She was aggressively diuresed in the hospital and lost around 70 lbs. She had BRBPR in the hospital, this was thought to be hemorrhoidal bleeding (had colonoscopy). She was subsequently discharged to SNF. She was admitted 09/19/15 - 09/21/15 for upgrade to CRT-P . She was noted to be markedly volume overloaded so HF team took over for diuresis. Overall diuresed 5.5 L, though weights were thought to be innacurate. Discharge weight was 363 lbs. Her weight has gone up about 10 lbs after decreasing her diuretics for worsening creatinine.    She was admitted for low hemoglobin. On admission, hemoglobin was 7.1. She has been transfused 2 U PRBC so far. GI has seen her in consultation. Cardiology is following for volume overload.   Assessment & Plan:   Active Problems:   Acute blood loss anemia / Acute GI bleed / Anemia of chronic kidney disease - Hemoglobin 7.1 on admission. She also has chronic anemia due to CKD - Her FOBT was positive on this admission and she had dark stools (noted pt also on iron) - She is on xarelto for a fib and this was stopped due to risk of bleed  - Started protonix  - EGD done 8/3 but no pathology noted - Her Hgb this am is 8.0 - Appreciate GI following    CKD stage 4 - Baseline Cr 2.51 about 1 month ago - Cr on this admission 2.43 --> 2.31    Dyslipidemia - Continue statin therapy    OSA (obstructive sleep apnea) / Pulmonary hypertension - CPAP at bedtime    Chronic systolic and diastolic CHF  - Last 2 D ECHO in 05/2015 with EF 40-45%, moderate  pulmonary hypertension  - Appreciate cardio following - Pt on lasix drip  - CRT device interrogated and pacing appropriately - Continue daily weight and replete electrolytes as needed    Chronic atrial fibrillation - CHADS vasc score 5 (HTN, age, gender, CHF, DM) - Off of xarelto due to GI bleed - Rate controlled with BB    Essential hypertension - Continue metoprolol     Morbid obesity due to excess calories with comorbidities (HTN, DM, Dyslipidemia) - Body mass index is 64.14 kg/m. 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  Objective: Vitals:   11/30/15 0746 11/30/15 0953 11/30/15 1158 11/30/15 1521  BP: 128/81  (!) 128/55   Pulse: 64  62   Resp: 14  16  Temp: 97.7 F (36.5 C)  97.6 F (36.4 C)   TempSrc: Oral  Oral   SpO2: 100%  100%   Weight:  (!) 159.7 kg (352 lb)  (!) 169.5 kg (373 lb 10.9 oz)  Height:  5\' 4"  (1.626 m)  5\' 4"  (1.626 m)    Intake/Output Summary (Last 24 hours) at 11/30/15 1538 Last data filed at 11/30/15 1500  Gross per 24 hour  Intake           2705.2 ml  Output              950 ml  Net           1755.2 ml   Filed Weights   11/30/15 0445 11/30/15 0953 11/30/15 1521  Weight: (!) 160.1 kg (352 lb 15.3 oz) (!) 159.7 kg (352 lb) (!) 169.5 kg (373 lb 10.9 oz)    Examination:  General exam: No distress  Respiratory system: diminished, no wheezing Cardiovascular system:  S1 & S2 heard, RRR  Gastrointestinal system: non tender, (+) BS Central nervous system: No focal neurological deficits. Extremities: obese, no tenderness  Skin: No rashes, lesions or ulcers Psychiatry: Normal mood and behavior    Data Reviewed: I have personally reviewed following labs and imaging studies  CBC:  Recent Labs Lab 11/28/15 1551 11/29/15 0457 11/30/15 0507  WBC 7.0 7.0 4.3  HGB 7.1* 7.5* 8.0*  HCT 24.1* 24.3* 27.2*  MCV 100.0 95.7 97.8  PLT 247 201 [id number]   Basic Metabolic Panel:  Recent Labs Lab 11/28/15 1551 11/29/15 0457 11/30/15 0507  NA 138 138 138  K 4.2 3.9 3.6  CL 106 109 104  CO2 23 23 24   GLUCOSE 131* 94 72  BUN 61* 62* 56*  CREATININE 2.35* 2.43* 2.31*  CALCIUM 9.4 8.9 9.1  MG  --  2.2  --   PHOS  --  4.1  --    GFR: Estimated Creatinine Clearance: 38 mL/min (by C-G formula based on SCr of 2.31 mg/dL). Liver Function Tests:  Recent Labs Lab 11/28/15 1551 11/29/15 0457  AST 23 19  ALT 13* 11*  ALKPHOS 84 72  BILITOT 1.1 1.7*  PROT 6.4* 5.7*  ALBUMIN 2.9* 2.6*   No results for input(s): LIPASE, AMYLASE in the last 168 hours. No results for input(s): AMMONIA in the last 168 hours. Coagulation Profile: No results for input(s): INR, PROTIME in the last 168 hours. Cardiac Enzymes:  Recent Labs Lab 11/29/15 0457 11/29/15 1057  TROPONINI <0.03 <0.03   BNP (last 3 results) No results for input(s): PROBNP in the last 8760 hours. HbA1C:  Recent Labs  11/29/15 0457  HGBA1C 5.1   CBG:  Recent Labs Lab 11/29/15 2003 11/30/15 0036 11/30/15 0451 11/30/15 0753 11/30/15 1212  GLUCAP 92 75 73 69 72   Lipid Profile: No results for input(s): CHOL, HDL, LDLCALC, TRIG, CHOLHDL, LDLDIRECT in the last 72 hours. Thyroid Function Tests:  Recent Labs  11/29/15 0457  TSH 2.092   Anemia Panel: No results for input(s): VITAMINB12, FOLATE, FERRITIN, TIBC, IRON, RETICCTPCT in the last 72 hours. Urine analysis:    Component Value  Date/Time   COLORURINE YELLOW 06/13/2015 0134   APPEARANCEUR CLOUDY (A) 06/13/2015 0134   LABSPEC 1.017 06/13/2015 0134   PHURINE 5.0 06/13/2015 0134   GLUCOSEU NEGATIVE 06/13/2015 0134   HGBUR NEGATIVE 06/13/2015 0134   BILIRUBINUR NEGATIVE 06/13/2015 0134   KETONESUR NEGATIVE 06/13/2015 0134   PROTEINUR 30 (A) 06/13/2015 0134   NITRITE POSITIVE (A)  06/13/2015 0134   LEUKOCYTESUR SMALL (A) 06/13/2015 0134   Sepsis Labs: @LABRCNTIP (procalcitonin:4,lacticidven:4)    Recent Results (from the past 240 hour(s))  MRSA PCR Screening     Status: None   Collection Time: 11/29/15  6:24 AM  Result Value Ref Range Status   MRSA by PCR NEGATIVE NEGATIVE Final      Radiology Studies: Dg Chest 2 View Result Date: 11/28/2015 CLINICAL DATA: No active cardiopulmonary disease. Electronically Signed   By: Marijo Conception, M.D.   On: 11/28/2015 20:10      Scheduled Meds: . sodium chloride   Intravenous Once  . acetaminophen  650 mg Oral Once  . allopurinol  100 mg Oral Daily  . atorvastatin  20 mg Oral Daily  . insulin aspart  0-9 Units Subcutaneous Q4H  . loratadine  10 mg Oral Daily  . metoprolol succinate  25 mg Oral Daily  . mometasone-formoterol  2 puff Inhalation BID  . pantoprazole  40 mg Oral BID  . potassium chloride  40 mEq Oral BID  . sodium chloride flush  10-40 mL Intracatheter Q12H  . sodium chloride flush  3 mL Intravenous Q12H   Continuous Infusions: . furosemide (LASIX) infusion 12 mg/hr (11/30/15 1114)     LOS: 2 days    Time spent: 25 minutes  Greater than 50% of the time spent on counseling and coordinating the care.   Leisa Lenz, MD [facility] Pager [phone]  If 7PM-7AM, please contact night-coverage [email or web address] Password [facility] 11/30/2015, 3:38 PM

## 2015-11-30 NOTE — Progress Notes (Signed)
Pt ingested givens capsule at 0950.  Instructions given to patient and RN.    Vista Lawman, RN

## 2015-11-30 NOTE — Care Management Important Message (Signed)
Important Message  Patient Details  Name: Robyn Porter MRN: [id number] Date of Birth: 08/28/1950   Medicare Important Message Given:  Yes    [name] [name] 11/30/2015, 11:06 AM

## 2015-11-30 NOTE — Progress Notes (Signed)
Advanced Heart Failure Rounding Note  Primary Cardiologist: Aundra Dubin Reason for Consultation: CHF  Subjective:    Admitted 11/28/15 with anemia/Hgb down to 7.1.  GI work up ongoing. Also noted to have marked volume overload.  Feels Ok this morning. Feeling somewhat better than admit.  Getting capsule endo today.  States she had been taking medicine as directed and watching fluids at home.   CVP 24-25  I/O positive with blood products.  Weight shows down 4 lbs, though bed weights. Creatinine remains elevated but relatively stable. K 3.6  Echo (11/29/15) with EF 50-55%, mildly dilated RV, moderate AS with mild to moderate AI.   Objective:   Weight Range: (!) 352 lb 15.3 oz (160.1 kg) Body mass index is 60.58 kg/m.   Vital Signs:   Temp:  [97.6 F (36.4 C)-98.4 F (36.9 C)] 97.6 F (36.4 C) (08/04 0445) Pulse Rate:  [60-68] 68 (08/04 0445) Resp:  [14-20] 18 (08/04 0445) BP: (97-136)/(44-75) 125/61 (08/04 0445) SpO2:  [94 %-100 %] 98 % (08/04 0445) FiO2 (%):  [21 %] 21 % (08/03 1927) Weight:  [352 lb 15.3 oz (160.1 kg)] 352 lb 15.3 oz (160.1 kg) (08/04 0445) Last BM Date: 11/27/15  Weight change: Filed Weights   11/28/15 2240 11/29/15 0500 11/30/15 0445  Weight: (!) 354 lb 15.1 oz (161 kg) (!) 356 lb 2.1 oz (161.5 kg) (!) 352 lb 15.3 oz (160.1 kg)    Intake/Output:   Intake/Output Summary (Last 24 hours) at 11/30/15 0747 Last data filed at 11/29/15 2100  Gross per 24 hour  Intake             2100 ml  Output              500 ml  Net             1600 ml     Physical Exam: CVP 24-25 General: NAD Neck: Thick, JVP difficult but estimate 14+, no thyromegaly or thyroid nodule.  Lungs: Diminished basilar sounds.  CV: Nondisplaced PMI.  Heart regular S1/S2, no [id number], 3/6 systolic crescendo-decrescendo murmur with obscured S2.  - Trace to 1+ edema to thighs.  No carotid bruit.  Normal pedal pulses.  Abdomen: Morbidly obese, soft, NT, ND, no HSM. No bruits or masses. +BS    Skin: Intact without lesions or rashes.  Neurologic: Alert and oriented x 3.  Psych: Normal affect. Extremities: No clubbing or cyanosis.  HEENT: Normal.   Telemetry: Afib 60s  Labs: CBC  Recent Labs  11/29/15 0457 11/30/15 0507  WBC 7.0 4.3  HGB 7.5* 8.0*  HCT 24.3* 27.2*  MCV 95.7 97.8  PLT 201 [id number]   Basic Metabolic Panel  Recent Labs  11/29/15 0457 11/30/15 0507  NA 138 138  K 3.9 3.6  CL 109 104  CO2 23 24  GLUCOSE 94 72  BUN 62* 56*  CREATININE 2.43* 2.31*  CALCIUM 8.9 9.1  MG 2.2  --   PHOS 4.1  --    Liver Function Tests  Recent Labs  11/28/15 1551 11/29/15 0457  AST 23 19  ALT 13* 11*  ALKPHOS 84 72  BILITOT 1.1 1.7*  PROT 6.4* 5.7*  ALBUMIN 2.9* 2.6*   No results for input(s): LIPASE, AMYLASE in the last 72 hours. Cardiac Enzymes  Recent Labs  11/29/15 0457 11/29/15 1057  TROPONINI <0.03 <0.03    BNP: BNP (last 3 results)  Recent Labs  06/12/15 2328 07/09/15 1158 07/19/15 1124  BNP 940.9* 133.0* 149.0*  ProBNP (last 3 results) No results for input(s): PROBNP in the last 8760 hours.   D-Dimer No results for input(s): DDIMER in the last 72 hours. Hemoglobin A1C  Recent Labs  11/29/15 0457  HGBA1C 5.1   Fasting Lipid Panel No results for input(s): CHOL, HDL, LDLCALC, TRIG, CHOLHDL, LDLDIRECT in the last 72 hours. Thyroid Function Tests  Recent Labs  11/29/15 0457  TSH 2.092    Other results:     Imaging/Studies:  Dg Chest 2 View  Result Date: 11/28/2015 CLINICAL DATA:  Congestion, weakness. EXAM: CHEST  2 VIEW COMPARISON:  Radiographs of Sep 20, 2015. FINDINGS: Mild cardiomegaly is noted. Left-sided pacemaker is unchanged in position. No pneumothorax or pleural effusion is noted. No acute pulmonary disease is noted. Bony thorax is unremarkable. IMPRESSION: No active cardiopulmonary disease. Electronically Signed   By: Marijo Conception, M.D.   On: 11/28/2015 20:10   Dg Chest Port 1 View  Result Date:  11/29/2015 CLINICAL DATA:  Right PICC placement. EXAM: PORTABLE CHEST 1 VIEW COMPARISON:  11/28/2015. FINDINGS: Interval right PICC extending into the inferior aspect of the superior vena cava. The tip is poorly visualized and appears to be within the inferior aspect of the superior vena cava. Stable enlarged cardiac silhouette and left subclavian pacemaker leads. The pulmonary vasculature and interstitial markings remain mildly prominent. Thoracic spine degenerative changes. IMPRESSION: 1. Right PICC tip in the inferior aspect of the superior vena cava. If a position at the superior cavoatrial junction is desired, it would be recommended that this be advanced 2 cm. 2. Stable cardiomegaly, mild pulmonary vascular congestion and mild chronic interstitial lung disease Electronically Signed   By: Claudie Revering M.D.   On: 11/29/2015 19:59     Latest Echo  Latest Cath   Medications:     Scheduled Medications: . sodium chloride   Intravenous Once  . acetaminophen  650 mg Oral Once  . allopurinol  100 mg Oral Daily  . atorvastatin  20 mg Oral Daily  . furosemide  80 mg Intravenous BID  . insulin aspart  0-9 Units Subcutaneous Q4H  . loratadine  10 mg Oral Daily  . metoprolol succinate  25 mg Oral Daily  . mometasone-formoterol  2 puff Inhalation BID  . pantoprazole  40 mg Oral BID  . sodium chloride flush  10-40 mL Intracatheter Q12H  . sodium chloride flush  3 mL Intravenous Q12H     Infusions:     PRN Medications:  acetaminophen **OR** acetaminophen, fentaNYL (SUBLIMAZE) injection, HYDROcodone-acetaminophen, ipratropium-albuterol, ondansetron **OR** ondansetron (ZOFRAN) IV, ondansetron (ZOFRAN) IV, sodium chloride flush   Assessment/Plan   65 yo with morbid obesity, chronic primarily diastolic CHF (EF [id number]), CKD stage III was admitted with anemia and suspected GI bleeding, also with suspected CHF exacerbation given significant weight gain.   1. GI bleeding/anemia: Heme+.  Has dark  stools but also on iron.  Has had periodic hematochezia that she attributes to hemorrhoids.  Has had 2 units PRBCs so far.  EGD on 8/3 with no source of bleeding.  - Xarelto on hold. On PPI.  - Capsule Endo scheduled for today.  2. Acute on chronic primarily diastolic CHF: EF 99991111 with mild RV dilation on echo 11/29/15.  PICC Line placed.  CVP 24-25 this am. She remains markedly volume overloaded.  - Will give 80 mg IV lasix bolus now and transition to lasix drip at 12 mg/hr.  She will also get a dose of metolazone 2.5 x 1 today.  -  Continue Toprol XL at 25 mg daily, BP is ok.  - CRT device interrogated, she is BiV pacing appropriately (94% BiV pacing) and EF is higher than prior.  - Need daily weights and strict I/Os.  3. AKI on CKD Stage III: Continue to follow closely with diuresis.  BUN/creatinine lower today.  - She has renal appointment scheduled for 12/12/15 with [facility] - Dr. Hollie Salk - If run into difficulty diuresis or rise in creatinine, will consider renal consult in house.  4. Murmur: Moderate AS with mild/mod AI  on Echo 11/29/15.   5. Bradycardia: MDT CRT-P device functioning appropriately.  6. Atrial fibrillation: Chronic - off Xarelto for the time being with possible GI bleeding.  - Rate controlled.  Length of Stay: 2  Shirley Friar PA-C 11/30/2015, 7:47 AM  Advanced Heart Failure Team Pager [phone] (M-F; 7a - 4p)  Please contact [facility] for night-coverage after hours (4p -7a ) and weekends on [email or web address]  Patient seen with PA, agree with the above note.  Acute on chronic diastolic CHF with marked volume overload.  CVP > 20.   - Lasix 80 mg IV x 1 then 12 mg/hr gtt, will also give metolazone 2.5 x 1 today.  May need higher diuretic dosing but will see how she does today.  - Creatinine lower today, follow closely.   Capsule endoscopy today, still unsure of bleeding source.  She is off Xarelto for the time being.    [name] 11/30/2015 10:23 AM

## 2015-11-30 NOTE — Anesthesia Postprocedure Evaluation (Signed)
Anesthesia Post Note  Patient: Robyn Porter  Procedure(s) Performed: Procedure(s) (LRB): ESOPHAGOGASTRODUODENOSCOPY (EGD) (N/A)  Patient location during evaluation: Endoscopy Anesthesia Type: MAC Level of consciousness: awake and alert Pain management: pain level controlled Vital Signs Assessment: post-procedure vital signs reviewed and stable Respiratory status: spontaneous breathing, nonlabored ventilation, respiratory function stable and patient connected to nasal cannula oxygen Cardiovascular status: stable and blood pressure returned to baseline Anesthetic complications: no    Last Vitals:  Vitals:   11/30/15 0445 11/30/15 0746  BP: 125/61 128/81  Pulse: 68 64  Resp: 18 14  Temp: 36.4 C 36.5 C    Last Pain:  Vitals:   11/30/15 0746  TempSrc: Oral                 Catalina Gravel

## 2015-11-30 NOTE — Progress Notes (Signed)
Occupational Therapy Treatment Patient Details Name: Robyn Porter MRN: [id number] DOB: [birth year] Today's Date: 11/30/2015    History of present illness Robyn Porter is a 64 y.o. female with medical history significant of OHS/OSA, chronic systolic CHF and chronic atrial fibrillation on Xarelto. Found to have: worsening anemia and ascending of fluid overload recent cellulitis Hemoccult positive stools in the setting of chronic melena   OT comments  Pt in good spirits and cooperative with OT POC. Unable to have +2 assist with pt this session for OOB activity. Educated pt on benefits of participating in her own selfcare as much as possible with nursing staff. OT to continue to follow acutely.   Follow Up Recommendations  SNF;Other (comment) ([place] [facility] if progreesing quickly on acute care)    Equipment Recommendations  None recommended by OT    Recommendations for Other Services      Precautions / Restrictions Precautions Precautions: Fall Restrictions Weight Bearing Restrictions: No       Mobility Bed Mobility Overal bed mobility: Modified Independent             General bed mobility comments: rolling L and R for simulated bathing tasks  Transfers                 General transfer comment: did not address this session. Per last session pt required +2 min A    Balance     Sitting balance-Leahy Scale: Good                             ADL       Grooming: Set up;Sitting;Wash/dry hands;Wash/dry face Grooming Details (indicate cue type and reason): sitting upright in bed, unsupported Upper Body Bathing: Supervision/ safety;Sitting   Lower Body Bathing: Moderate assistance (simulated) Lower Body Bathing Details (indicate cue type and reason): sitting upright in bed, unsupported. Pt able to roll L - R to simulate partially bathe buttocks                       General ADL Comments: educated pt on the benefits/importance of participating in  her selfcare as much a spossible with nursing staff      Vision  no change from baseline                              Cognition   Behavior During Therapy: [facility] for tasks assessed/performed Overall Cognitive Status: Within Functional Limits for tasks assessed                       Extremity/Trunk Assessment   WFL                        General Comments  pt very pleasant and cooperative    Pertinent Vitals/ Pain       Pain Assessment: No/denies pain  Home Living  home alone                                        Prior Functioning/Environment   Mod I           Frequency Min 2X/week     Progress Toward Goals  OT Goals(current goals can now be found in the care plan section)  Progress  towards OT goals: OT to reassess next treatment     Plan Discharge plan remains appropriate                     End of Session     Activity Tolerance Patient tolerated treatment well   Patient Left in bed;with call bell/phone within reach   Nurse Communication Mobility status;Other (comment) (LB bathing ability; allow pt to participate in her own selfcare as much as possible)        Time: [id number] OT Time Calculation (min): 24 min  Charges: OT Treatments $Self Care/Home Management : 8-22 mins $Therapeutic Activity: 8-22 mins  Britt Bottom 11/30/2015, 2:30 PM

## 2015-12-01 DIAGNOSIS — D649 Anemia, unspecified: Secondary | ICD-10-CM

## 2015-12-01 DIAGNOSIS — E785 Hyperlipidemia, unspecified: Secondary | ICD-10-CM

## 2015-12-01 DIAGNOSIS — G4733 Obstructive sleep apnea (adult) (pediatric): Secondary | ICD-10-CM

## 2015-12-01 DIAGNOSIS — I5033 Acute on chronic diastolic (congestive) heart failure: Secondary | ICD-10-CM | POA: Diagnosis present

## 2015-12-01 LAB — BASIC METABOLIC PANEL
Anion gap: 10 (ref 5–15)
BUN: 54 mg/dL — AB (ref 6–20)
CALCIUM: 9.1 mg/dL (ref 8.9–10.3)
CO2: 25 mmol/L (ref 22–32)
Chloride: 103 mmol/L (ref 101–111)
Creatinine, Ser: 2.11 mg/dL — ABNORMAL HIGH (ref 0.44–1.00)
GFR calc Af Amer: 27 mL/min — ABNORMAL LOW (ref >60)
GFR, EST NON AFRICAN AMERICAN: 23 mL/min — AB (ref >60)
GLUCOSE: 91 mg/dL (ref 65–99)
Potassium: 3.5 mmol/L (ref 3.5–5.1)
Sodium: 138 mmol/L (ref 135–145)

## 2015-12-01 LAB — GLUCOSE, CAPILLARY
GLUCOSE-CAPILLARY: 104 mg/dL — AB (ref 65–99)
GLUCOSE-CAPILLARY: 120 mg/dL — AB (ref 65–99)
GLUCOSE-CAPILLARY: 127 mg/dL — AB (ref 65–99)
Glucose-Capillary: 104 mg/dL — ABNORMAL HIGH (ref 65–99)
Glucose-Capillary: 96 mg/dL (ref 65–99)
Glucose-Capillary: 96 mg/dL (ref 65–99)

## 2015-12-01 LAB — CBC
HEMATOCRIT: 25.5 % — AB (ref 36.0–46.0)
HEMOGLOBIN: 7.8 g/dL — AB (ref 12.0–15.0)
MCH: 29.5 pg (ref 26.0–34.0)
MCHC: 30.6 g/dL (ref 30.0–36.0)
MCV: 96.6 fL (ref 78.0–100.0)
Platelets: 170 10*3/uL (ref 150–400)
RBC: 2.64 MIL/uL — ABNORMAL LOW (ref 3.87–5.11)
RDW: 18.9 % — AB (ref 11.5–15.5)
WBC: 4.7 10*3/uL (ref 4.0–10.5)

## 2015-12-01 MED ORDER — PEG-KCL-NACL-NASULF-NA ASC-C 100 G PO SOLR
0.5000 | Freq: Once | ORAL | Status: AC
  Administered 2015-12-01: 100 g via ORAL
  Filled 2015-12-01 (×2): qty 1

## 2015-12-01 MED ORDER — PEG-KCL-NACL-NASULF-NA ASC-C 100 G PO SOLR: 1.0000 | Freq: Once | ORAL | Status: DC

## 2015-12-01 MED ORDER — PEG-KCL-NACL-NASULF-NA ASC-C 100 G PO SOLR
0.5000 | Freq: Once | ORAL | Status: AC
  Administered 2015-12-01: 100 g via ORAL
  Filled 2015-12-01: qty 1

## 2015-12-01 NOTE — Progress Notes (Signed)
SUBJECTIVE: Denies chest pain/SOB. Just feels weak. Additional 275 cc urine collected and not reported yet.  Echo (11/29/15) with EF 50-55%, mildly dilated RV, moderate AS with mild to moderate AI.   ROS: Other than pertinent positives in "Subjective", all others were reviewed and found to be negative.   Intake/Output Summary (Last 24 hours) at 12/01/15 1059 Last data filed at 12/01/15 1000  Gross per 24 hour  Intake           1797.2 ml  Output             4325 ml  Net          -2527.8 ml    Current Facility-Administered Medications  Medication Dose Route Frequency Provider Last Rate Last Dose  . 0.9 %  sodium chloride infusion   Intravenous Once Pattricia Boss, MD      . acetaminophen (TYLENOL) tablet 650 mg  650 mg Oral Q6H PRN Toy Baker, MD       Or  . acetaminophen (TYLENOL) suppository 650 mg  650 mg Rectal Q6H PRN Toy Baker, MD      . acetaminophen (TYLENOL) tablet 650 mg  650 mg Oral Once Toy Baker, MD      . allopurinol (ZYLOPRIM) tablet 100 mg  100 mg Oral Daily Toy Baker, MD   100 mg at 12/01/15 0909  . atorvastatin (LIPITOR) tablet 20 mg  20 mg Oral Daily Toy Baker, MD   20 mg at 12/01/15 0910  . furosemide (LASIX) 250 mg in dextrose 5 % 250 mL (1 mg/mL) infusion  12 mg/hr Intravenous Continuous Shirley Friar, PA-C 12 mL/hr at 12/01/15 0905 12 mg/hr at 12/01/15 0905  . HYDROcodone-acetaminophen (NORCO/VICODIN) 5-325 MG per tablet 1-2 tablet  1-2 tablet Oral Q4H PRN Toy Baker, MD      . insulin aspart (novoLOG) injection 0-9 Units  0-9 Units Subcutaneous Q4H Anastassia Doutova, MD      . ipratropium-albuterol (DUONEB) 0.5-2.5 (3) MG/3ML nebulizer solution 3 mL  3 mL Nebulization Q6H PRN Toy Baker, MD      . loratadine (CLARITIN) tablet 10 mg  10 mg Oral Daily Toy Baker, MD   10 mg at 12/01/15 0909  . metoprolol succinate (TOPROL-XL) 24 hr tablet 25 mg  25 mg Oral Daily Larey Dresser, MD    25 mg at 12/01/15 [id number]  . mometasone-formoterol (DULERA) 100-5 MCG/ACT inhaler 2 puff  2 puff Inhalation BID Toy Baker, MD   2 puff at 12/01/15 0756  . ondansetron (ZOFRAN) tablet 4 mg  4 mg Oral Q6H PRN Toy Baker, MD       Or  . ondansetron (ZOFRAN) injection 4 mg  4 mg Intravenous Q6H PRN Toy Baker, MD      . pantoprazole (PROTONIX) EC tablet 40 mg  40 mg Oral BID Larey Dresser, MD   40 mg at 12/01/15 [id number]  . potassium chloride SA (K-DUR,KLOR-CON) CR tablet 40 mEq  40 mEq Oral BID Satira Mccallum Tillery, PA-C   40 mEq at 12/01/15 [id number]  . sodium chloride flush (NS) 0.9 % injection 10-40 mL  10-40 mL Intracatheter Q12H Robbie Lis, MD   20 mL at 12/01/15 0911  . sodium chloride flush (NS) 0.9 % injection 10-40 mL  10-40 mL Intracatheter PRN Robbie Lis, MD      . sodium chloride flush (NS) 0.9 % injection 3 mL  3 mL Intravenous Q12H Toy Baker, MD   3  mL at 11/29/15 2200    Vitals:   11/30/15 2330 12/01/15 0305 12/01/15 0730 12/01/15 0756  BP: 118/67 (!) 107/53 119/69   Pulse: 67 72 68   Resp: 17 (!) 28 14   Temp: 97.7 F (36.5 C) 97.7 F (36.5 C) 97.4 F (36.3 C)   TempSrc: Oral Oral Oral   SpO2: 99% 97% 99% 97%  Weight:      Height:        PHYSICAL EXAM General: NAD Neck: Thick, JVP difficult to estimate Lungs: Diminished basilar sounds.  CV: Nondisplaced PMI. Heart regular S1/S2, no [id number], 3/6 systolic crescendo-decrescendo murmur with obscured S2.  - 1+ edema to thighs.   Abdomen: Morbidly obese, soft, NT, ND  Skin: Intact without lesions or rashes.  Neurologic: Alert and oriented x 3.  Psych: Normal affect. Extremities: No clubbing or cyanosis.  HEENT: Normal.   LABS: Basic Metabolic Panel:  Recent Labs  11/29/15 0457 11/30/15 0507 12/01/15 0500  NA 138 138 138  K 3.9 3.6 3.5  CL 109 104 103  CO2 23 24 25   GLUCOSE 94 72 91  BUN 62* 56* 54*  CREATININE 2.43* 2.31* 2.11*  CALCIUM 8.9 9.1 9.1  MG 2.2  --   --     PHOS 4.1  --   --    Liver Function Tests:  Recent Labs  11/28/15 1551 11/29/15 0457  AST 23 19  ALT 13* 11*  ALKPHOS 84 72  BILITOT 1.1 1.7*  PROT 6.4* 5.7*  ALBUMIN 2.9* 2.6*   No results for input(s): LIPASE, AMYLASE in the last 72 hours. CBC:  Recent Labs  11/30/15 0507 12/01/15 0500  WBC 4.3 4.7  HGB 8.0* 7.8*  HCT 27.2* 25.5*  MCV 97.8 96.6  PLT 197 170   Cardiac Enzymes:  Recent Labs  11/29/15 0457 11/29/15 1057  TROPONINI <0.03 <0.03   BNP: Invalid input(s): POCBNP D-Dimer: No results for input(s): DDIMER in the last 72 hours. Hemoglobin A1C:  Recent Labs  11/29/15 0457  HGBA1C 5.1   Fasting Lipid Panel: No results for input(s): CHOL, HDL, LDLCALC, TRIG, CHOLHDL, LDLDIRECT in the last 72 hours. Thyroid Function Tests:  Recent Labs  11/29/15 0457  TSH 2.092   Anemia Panel: No results for input(s): VITAMINB12, FOLATE, FERRITIN, TIBC, IRON, RETICCTPCT in the last 72 hours.  RADIOLOGY: Dg Chest 2 View  Result Date: 11/28/2015 CLINICAL DATA:  Congestion, weakness. EXAM: CHEST  2 VIEW COMPARISON:  Radiographs of Sep 20, 2015. FINDINGS: Mild cardiomegaly is noted. Left-sided pacemaker is unchanged in position. No pneumothorax or pleural effusion is noted. No acute pulmonary disease is noted. Bony thorax is unremarkable. IMPRESSION: No active cardiopulmonary disease. Electronically Signed   By: Marijo Conception, M.D.   On: 11/28/2015 20:10   Dg Chest Port 1 View  Result Date: 11/29/2015 CLINICAL DATA:  Right PICC placement. EXAM: PORTABLE CHEST 1 VIEW COMPARISON:  11/28/2015. FINDINGS: Interval right PICC extending into the inferior aspect of the superior vena cava. The tip is poorly visualized and appears to be within the inferior aspect of the superior vena cava. Stable enlarged cardiac silhouette and left subclavian pacemaker leads. The pulmonary vasculature and interstitial markings remain mildly prominent. Thoracic spine degenerative changes.  IMPRESSION: 1. Right PICC tip in the inferior aspect of the superior vena cava. If a position at the superior cavoatrial junction is desired, it would be recommended that this be advanced 2 cm. 2. Stable cardiomegaly, mild pulmonary vascular congestion and mild chronic interstitial  lung disease Electronically Signed   By: Claudie Revering M.D.   On: 11/29/2015 19:59      ASSESSMENT AND PLAN: 65 yo with morbid obesity, chronic primarily diastolic CHF (EF A999333), CKD stage III was admitted with anemia and suspected GI bleeding, also with suspected CHF exacerbation given significant weight gain.   1. GI bleeding/anemia: Heme+. Has dark stools but also on iron. Has had periodic hematochezia that she attributes to hemorrhoids. Had PRBCs transfusion. EGD on 8/3 with no source of bleeding.  - Xarelto on hold. On PPI.  - Capsule Endo completed, awaiting interpretation.   2. Acute on chronic primarily diastolic CHF: EF 99991111 with mild RV dilation on echo 11/29/15. PICC Line placed.  CVP >20. She remains markedly volume overloaded.  - Will continue Lasix drip at 12 mg/hr.  She has had at least 2.8 L output in last 24+ hrs.  - Continue Toprol XL at 25 mg daily, BP is ok.  - CRT device interrogated, she is BiV pacing appropriately (94% BiV pacing) and EF is higher than prior.  - Need daily weights and strict I/Os.   3. AKI on CKD Stage III: Continue to follow closely with diuresis.  BUN/creatinine lower today.  - She has renal appointment scheduled for 12/12/15 with [facility] - Dr. Hollie Salk - If run into difficulty diuresis or rise in creatinine, will consider renal consult in house.   4. Murmur: Moderate AS with mild/mod AI  on Echo 11/29/15.    5. Bradycardia: MDT CRT-P device functioning appropriately.   6. Atrial fibrillation: Chronic - off Xarelto for the time being with possible GI bleeding.  - Rate controlled.    Kate Sable, M.D., F.A.C.C.

## 2015-12-01 NOTE — Clinical Social Work Note (Signed)
Clinical Social Work Assessment  Patient Details  Name: Robyn Porter MRN: [id number] Date of Birth: 04-Nov-1950  Date of referral:  12/01/15               Reason for consult:  Discharge Planning                Permission sought to share information with:  Case Manager, Facility Sport and exercise psychologist, Family Supports Permission granted to share information::  Yes, Verbal Permission Granted  Name::        Agency::     Relationship::     Contact Information:     Housing/Transportation Living arrangements for the past 2 months:  Single Family Home Source of Information:  Patient, Medical Team Patient Interpreter Needed:  None Criminal Activity/Legal Involvement Pertinent to Current Situation/Hospitalization:  No - Comment as needed Significant Relationships:  Other Family Members Lives with:  Self Do you feel safe going back to the place where you live?  No Need for family participation in patient care:  Yes (Comment)  Care giving concerns:  Pt expressed no care giving concerns at this time. Pt agreeable to SNF for higher level of care.    Social Worker assessment / plan: Holiday representative met with pt and discussed CSW role with discharge planning. Pt stated she would like [facility] as first choice for SNF. Pt also stated she would like to discharge home with home health if PT states pt is able to do discharge home instead of SNF.   Employment status:  Retired Forensic scientist:  Medicare PT Recommendations:  [place] / Referral to community resources:  [place]  Patient/Family's Response to care: Pt pleasant and sitting up in bed with niece at bedside. Pt appears happy with care she is receiving at [facility].   Patient/Family's Understanding of and Emotional Response to Diagnosis, Current Treatment, and Prognosis: Pt and family appear to have a good understanding of reason for pt admission into the hospital and of pt care  plan.   Emotional Assessment Appearance:  Appears stated age, Well-Groomed Attitude/Demeanor/Rapport:   (Pleasant) Affect (typically observed):  Accepting, Appropriate, Happy, Pleasant, Hopeful Orientation:  Oriented to Self, Oriented to Place, Oriented to  Time, Oriented to Situation Alcohol / Substance use:  Not Applicable Psych involvement (Current and /or in the community):  No (Comment)  Discharge Needs  Concerns to be addressed:  Discharge Planning Concerns Readmission within the last 30 days:  No Current discharge risk:  None Barriers to Discharge:  Continued Medical Work up   [employer], LCSW 12/01/2015, 3:53 PM

## 2015-12-01 NOTE — Progress Notes (Addendum)
Patient ID: Robyn Porter, female   DOB: 1950-10-20, 65 y.o.   MRN: [id number]  PROGRESS NOTE    Robyn Porter  [id number] DOB: [birth year] DOA: 11/28/2015  PCP: Michell Heinrich, DO   Brief Narrative:  65 yo with history of OHS/OSA, chronic systolic and diastolic CHF (last 2 D ECHO in 05/2015 showed EF 45%), atrial fibrillation on xarelto. She was admitted in 2/17 to [facility] with massive volume overload. She was aggressively diuresed in the hospital and lost around 70 lbs. She had BRBPR in the hospital, this was thought to be hemorrhoidal bleeding (had colonoscopy). She was subsequently discharged to SNF. She was admitted 09/19/15 - 09/21/15 for upgrade to CRT-P . She was noted to be markedly volume overloaded so HF team took over for diuresis. Overall diuresed 5.5 L, though weights were thought to be innacurate. Discharge weight was 363 lbs. Her weight has gone up about 10 lbs after decreasing her diuretics for worsening creatinine.    She was admitted for low hemoglobin. On admission, hemoglobin was 7.1. She has been transfused 2 U PRBC so far. GI has seen her in consultation. Cardiology is following for volume overload.   Assessment & Plan:   Active Problems:   Acute blood loss anemia / Acute GI bleed / Anemia of chronic kidney disease - Hemoglobin 7.1 on admission. She also has chronic anemia due to CKD - FOBT was positive on this admission and she had dark stools (noted pt also on iron) - Xarelto stopped due to risk of bleed  - Continue protonix  - EGD done 8/3 but no pathology noted - Awaiting results of capsule endoscopy  - Appreciate GI following    CKD stage 4 - Baseline Cr 2.51 about 1 month ago - Cr on this admission 2.43 --> 2.31 --> 2.11    Dyslipidemia - Continue statin therapy    OSA (obstructive sleep apnea) / Pulmonary hypertension - CPAP at bedtime    Acute on chronic systolic and diastolic CHF  - Last 2 D ECHO in 05/2015 with EF 40-45%, moderate  pulmonary hypertension  - Appreciate cardio following - Pt on lasix drip  - CRT device interrogated and pacing appropriately - Continue daily weight and replete electrolytes as needed - Weight since 8/4 160.1 kg --> 169.5 kg     Chronic atrial fibrillation - CHADS vasc score 5 (HTN, age, gender, CHF, DM) - Off of xarelto due to GI bleed - Rate controlled with BB    Essential hypertension - Continue metoprolol     Morbid obesity due to excess calories with comorbidities (HTN, DM, Dyslipidemia) - Body mass index is 64.14 kg/m. 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  Objective: Vitals:   12/01/15 0305 12/01/15 0730 12/01/15 0756 12/01/15 1100  BP: (!) 107/53 119/69  (!) 123/54  Pulse: 72 68  65  Resp: (!) 28 14  Temp: 97.7 F (36.5 C) 97.4 F (36.3 C)  97.8 F (36.6 C)  TempSrc: Oral Oral  Oral  SpO2: 97% 99% 97%   Weight:      Height:        Intake/Output Summary (Last 24 hours) at 12/01/15 1253 Last data filed at 12/01/15 1200  Gross per 24 hour  Intake             1722 ml  Output             4675 ml  Net            -2953 ml   Filed Weights   11/30/15 0445 11/30/15 0953 11/30/15 1521  Weight: (!) 160.1 kg (352 lb 15.3 oz) (!) 159.7 kg (352 lb) (!) 169.5 kg (373 lb 10.9 oz)    Examination:  General exam: Appears calm and comfortable Respiratory system: Respiratory effort normal. Cardiovascular system: S1 & S2  heard, Rate controlled  Gastrointestinal system: Abdomen is obese, soft and nontender.  Central nervous system: Alert and oriented. No focal neurological deficits. Extremities: obese, palpable pulses  Skin: No rashes, lesions or ulcers Psychiatry: Judgement and insight appear normal. Mood & affect appropriate.     Data Reviewed: I have personally reviewed following labs and imaging studies  CBC:  Recent Labs Lab 11/28/15 1551 11/29/15 0457 11/30/15 0507 12/01/15 0500  WBC 7.0 7.0 4.3 4.7  HGB 7.1* 7.5* 8.0* 7.8*  HCT 24.1* 24.3* 27.2* 25.5*  MCV 100.0 95.7 97.8 96.6  PLT 247 201 197 [id number]   Basic Metabolic Panel:  Recent Labs Lab 11/28/15 1551 11/29/15 0457 11/30/15 0507 12/01/15 0500  NA 138 138 138 138  K 4.2 3.9 3.6 3.5  CL 106 109 104 103  CO2 23 23 24 25   GLUCOSE 131* 94 72 91  BUN 61* 62* 56* 54*  CREATININE 2.35* 2.43* 2.31* 2.11*  CALCIUM 9.4 8.9 9.1 9.1  MG  --  2.2  --   --   PHOS  --  4.1  --   --    GFR: Estimated Creatinine Clearance: 41.7 mL/min (by C-G formula based on SCr of 2.11 mg/dL). Liver Function Tests:  Recent Labs Lab 11/28/15 1551 11/29/15 0457  AST 23 19  ALT 13* 11*  ALKPHOS 84 72  BILITOT 1.1 1.7*  PROT 6.4* 5.7*  ALBUMIN 2.9* 2.6*   No results for input(s): LIPASE, AMYLASE in the last 168 hours. No results for input(s): AMMONIA in the last 168 hours. Coagulation Profile: No results for input(s): INR, PROTIME in the last 168 hours. Cardiac Enzymes:  Recent Labs Lab 11/29/15 0457 11/29/15 1057  TROPONINI <0.03 <0.03   BNP (last 3 results) No results for input(s): PROBNP in the last 8760 hours. HbA1C:  Recent Labs  11/29/15 0457  HGBA1C 5.1   CBG:  Recent Labs Lab 11/30/15 1957 11/30/15 2332 12/01/15 0305 12/01/15 0803 12/01/15 1141  GLUCAP 116* 104* 104* 96 127*   Lipid Profile: No results for input(s): CHOL, HDL, LDLCALC, TRIG, CHOLHDL, LDLDIRECT in the last 72 hours. Thyroid Function Tests:  Recent  Labs  11/29/15 0457  TSH 2.092   Anemia Panel: No results for input(s): VITAMINB12, FOLATE, FERRITIN, TIBC, IRON, RETICCTPCT in the last 72 hours. Urine analysis:    Component Value Date/Time   COLORURINE YELLOW 06/13/2015 0134   APPEARANCEUR CLOUDY (A) 06/13/2015 0134   LABSPEC 1.017 06/13/2015 0134   PHURINE 5.0 06/13/2015 0134   GLUCOSEU NEGATIVE 06/13/2015 0134   HGBUR NEGATIVE  06/13/2015 0134   BILIRUBINUR NEGATIVE 06/13/2015 0134   KETONESUR NEGATIVE 06/13/2015 0134   PROTEINUR 30 (A) 06/13/2015 0134   NITRITE POSITIVE (A) 06/13/2015 0134   LEUKOCYTESUR SMALL (A) 06/13/2015 0134   Sepsis Labs: @LABRCNTIP (procalcitonin:4,lacticidven:4)    Recent Results (from the past 240 hour(s))  MRSA PCR Screening     Status: None   Collection Time: 11/29/15  6:24 AM  Result Value Ref Range Status   MRSA by PCR NEGATIVE NEGATIVE Final      Radiology Studies: Dg Chest 2 View Result Date: 11/28/2015 CLINICAL DATA: No active cardiopulmonary disease. Electronically Signed   By: Marijo Conception, M.D.   On: 11/28/2015 20:10      Scheduled Meds: . sodium chloride   Intravenous Once  . acetaminophen  650 mg Oral Once  . allopurinol  100 mg Oral Daily  . atorvastatin  20 mg Oral Daily  . insulin aspart  0-9 Units Subcutaneous Q4H  . loratadine  10 mg Oral Daily  . metoprolol succinate  25 mg Oral Daily  . mometasone-formoterol  2 puff Inhalation BID  . pantoprazole  40 mg Oral BID  . potassium chloride  40 mEq Oral BID  . sodium chloride flush  10-40 mL Intracatheter Q12H  . sodium chloride flush  3 mL Intravenous Q12H   Continuous Infusions: . furosemide (LASIX) infusion 12 mg/hr (12/01/15 0905)     LOS: 3 days    Time spent: 15 minutes  Greater than 50% of the time spent on counseling and coordinating the care.   Leisa Lenz, MD [facility] Pager [phone]  If 7PM-7AM, please contact night-coverage [email or web address] Password [email or web address] 12/01/2015, 12:53 PM

## 2015-12-01 NOTE — Care Management Note (Signed)
Case Management Note  Patient Details  Name: Lotoya Treglia MRN: [id number] Date of Birth: [birth year]  Subjective/Objective:  Patient for capsule study, to be read on 8/5, hgb 7.8 today , down from 8.0 on 8/4, conts on lasix drip, cvp monitoring.  Pt/ot eval rec snf, CSW referral.   NCM will cont to follow for dc needs.  Action/Plan:   Expected Discharge Date:                  Expected Discharge Plan:  Skilled Nursing Facility  In-House Referral:  Clinical Social Work  Discharge planning Services  CM Consult  Post Acute Care Choice:    Choice offered to:     DME Arranged:    DME Agency:     HH Arranged:    [place] Agency:     Status of Service:  In process, will continue to follow  If discussed at Long Length of Stay Meetings, dates discussed:    Additional Comments:  Zenon Mayo, RN 12/01/2015, 7:14 AM

## 2015-12-01 NOTE — NC FL2 (Signed)
[place] LEVEL OF CARE SCREENING TOOL     IDENTIFICATION  Patient Name: Robyn Porter Birthdate: October 08, 1950 Sex: female Admission Date (Current Location): 11/28/2015  [facility] [place] and [place] Number:  Herbalist and Address:  [facility] [place] [address] [place], [place] [address]      Provider Number: [id number]  Attending Physician Name and Address:  Robbie Lis, MD  Relative Name and Phone Number:       Current Level of Care: Hospital Recommended Level of Care: [place] Prior Approval Number:    Date Approved/Denied:   PASRR Number:   [id number]   Discharge Plan: SNF    Current Diagnoses: Patient Active Problem List   Diagnosis Date Noted  . Acute on chronic diastolic heart failure ([place])   . Dyslipidemia   . Melena 11/28/2015  . GI bleed 11/28/2015  . Acute on chronic systolic HF (heart failure) ([place])   . CHF (congestive heart failure) ([place]) 09/19/2015  . Chronic systolic CHF (congestive heart failure) ([place]) 07/09/2015  . Obesity hypoventilation syndrome ([place]) 07/09/2015  . Proteus mirabilis infection 07/08/2015  . HLD (hyperlipidemia) 07/08/2015  . Gout 07/08/2015  . Vitamin D deficiency 07/08/2015  . Asthma, mild   . Internal bleeding hemorrhoids   . Benign neoplasm of transverse colon   . Hematochezia   . Chronic anticoagulation   . Acute blood loss anemia   . Morbid obesity ([place])   . NSVT (nonsustained ventricular tachycardia) ([place])   . CKD (chronic kidney disease)   . Cardiomyopathy ([place])   . AKI (acute kidney injury) ([place])   . Chronic atrial fibrillation ([place]) 06/13/2015  . Diabetes mellitus type 2 in obese ([place]) 06/13/2015  . History of pacemaker 06/13/2015  . Symptomatic anemia 06/13/2015  . OSA (obstructive sleep apnea) 06/13/2015    Orientation RESPIRATION BLADDER Height & Weight     Self, Time, Situation, Place  Normal Incontinent Weight: (!) 352 lb (159.7 kg) Height:  5'  4" (162.6 cm)  BEHAVIORAL SYMPTOMS/MOOD NEUROLOGICAL BOWEL NUTRITION STATUS   (None)  (None) Continent Diet (Clear Liquid)  AMBULATORY STATUS COMMUNICATION OF NEEDS Skin   Extensive Assist Verbally Surgical wounds                       Personal Care Assistance Level of Assistance  Bathing, Feeding, Dressing Bathing Assistance: Limited assistance Feeding assistance: Independent Dressing Assistance: Limited assistance     Functional Limitations Info  Sight, Hearing, Speech Sight Info: Adequate Hearing Info: Adequate Speech Info: Adequate    SPECIAL CARE FACTORS FREQUENCY  OT (By licensed OT)       OT Frequency:  (5x/week)            Contractures Contractures Info: Not present    Additional Factors Info  Allergies, Code Status Code Status Info:  (Full) Allergies Info:  (Adhesive Tape, Amoxicillin, Contrast Media Iodinated Diagnostic Agents, Shellfish-derived Products, Benadryl Diphenhydramine Hcl)           Current Medications (12/01/2015):  This is the current hospital active medication list Current Facility-Administered Medications  Medication Dose Route Frequency Provider Last Rate Last Dose  . 0.9 %  sodium chloride infusion   Intravenous Once Pattricia Boss, MD      . acetaminophen (TYLENOL) tablet 650 mg  650 mg Oral Q6H PRN Toy Baker, MD       Or  . acetaminophen (TYLENOL) suppository 650 mg  650 mg Rectal Q6H PRN  Toy Baker, MD      . acetaminophen (TYLENOL) tablet 650 mg  650 mg Oral Once Toy Baker, MD      . allopurinol (ZYLOPRIM) tablet 100 mg  100 mg Oral Daily Toy Baker, MD   100 mg at 12/01/15 0909  . atorvastatin (LIPITOR) tablet 20 mg  20 mg Oral Daily Toy Baker, MD   20 mg at 12/01/15 0910  . furosemide (LASIX) 250 mg in dextrose 5 % 250 mL (1 mg/mL) infusion  12 mg/hr Intravenous Continuous Shirley Friar, PA-C 12 mL/hr at 12/01/15 0905 12 mg/hr at 12/01/15 0905  . HYDROcodone-acetaminophen  (NORCO/VICODIN) 5-325 MG per tablet 1-2 tablet  1-2 tablet Oral Q4H PRN Toy Baker, MD      . insulin aspart (novoLOG) injection 0-9 Units  0-9 Units Subcutaneous Q4H Toy Baker, MD   1 Units at 12/01/15 1250  . ipratropium-albuterol (DUONEB) 0.5-2.5 (3) MG/3ML nebulizer solution 3 mL  3 mL Nebulization Q6H PRN Toy Baker, MD      . loratadine (CLARITIN) tablet 10 mg  10 mg Oral Daily Toy Baker, MD   10 mg at 12/01/15 0909  . metoprolol succinate (TOPROL-XL) 24 hr tablet 25 mg  25 mg Oral Daily Larey Dresser, MD   25 mg at 12/01/15 9449  . mometasone-formoterol (DULERA) 100-5 MCG/ACT inhaler 2 puff  2 puff Inhalation BID Toy Baker, MD   2 puff at 12/01/15 0756  . ondansetron (ZOFRAN) tablet 4 mg  4 mg Oral Q6H PRN Toy Baker, MD       Or  . ondansetron (ZOFRAN) injection 4 mg  4 mg Intravenous Q6H PRN Toy Baker, MD      . pantoprazole (PROTONIX) EC tablet 40 mg  40 mg Oral BID Larey Dresser, MD   40 mg at 12/01/15 6759  . peg 3350 powder (MOVIPREP) kit 100 g  0.5 kit Oral Once Robbie Lis, MD       And  . peg 3350 powder (MOVIPREP) kit 100 g  0.5 kit Oral Once Robbie Lis, MD      . potassium chloride SA (K-DUR,KLOR-CON) CR tablet 40 mEq  40 mEq Oral BID Satira Mccallum Tillery, PA-C   40 mEq at 12/01/15 1638  . sodium chloride flush (NS) 0.9 % injection 10-40 mL  10-40 mL Intracatheter Q12H Robbie Lis, MD   20 mL at 12/01/15 0911  . sodium chloride flush (NS) 0.9 % injection 10-40 mL  10-40 mL Intracatheter PRN Robbie Lis, MD      . sodium chloride flush (NS) 0.9 % injection 3 mL  3 mL Intravenous Q12H Toy Baker, MD   3 mL at 11/29/15 2200     Discharge Medications: Please see discharge summary for a list of discharge medications.  Relevant Imaging Results:  Relevant Lab Results:   Additional Information  (SS#:[id number])  Junie Spencer, LCSW

## 2015-12-01 NOTE — Clinical Social Work Placement (Signed)
   CLINICAL SOCIAL WORK PLACEMENT  NOTE  Date:  12/01/2015  Patient Details  Name: Robyn Porter MRN: [id number] Date of Birth: 01-18-1951  Clinical Social Work is seeking post-discharge placement for this patient at the [place] level of care (*CSW will initial, date and re-position this form in  chart as items are completed):      Patient/family provided with [place] Work Department's list of facilities offering this level of care within the geographic area requested by the patient (or if unable, by the patient's family).  Yes   Patient/family informed of their freedom to choose among providers that offer the needed level of care, that participate in Medicare, Medicaid or managed care program needed by the patient, have an available bed and are willing to accept the patient.  Yes   Patient/family informed of [facility]'s ownership interest in [facility] and [facility], as well as of the fact that they are under no obligation to receive care at these facilities.  PASRR submitted to EDS on       PASRR number received on       Existing PASRR number confirmed on 12/01/15     FL2 transmitted to all facilities in geographic area requested by pt/family on 12/01/15     FL2 transmitted to all facilities within larger geographic area on       Patient informed that his/her managed care company has contracts with or will negotiate with certain facilities, including the following:            Patient/family informed of bed offers received.  Patient chooses bed at       Physician recommends and patient chooses bed at      Patient to be transferred to   on  .  Patient to be transferred to facility by       Patient family notified on   of transfer.  Name of family member notified:        PHYSICIAN Please prepare prescriptions, Please sign FL2     Additional Comment:    _______________________________________________ Junie Spencer,  LCSW 12/01/2015, 4:41 PM

## 2015-12-01 NOTE — Progress Notes (Addendum)
Progress Note   Subjective  Patient completed capsule study last night, it is downloading now, hopefully to be read later today. She denies any blood in the stools. No abdominal pain. Eating diet.     Objective   Vital signs in last 24 hours: Temp:  [97.4 F (36.3 C)-98.4 F (36.9 C)] 97.4 F (36.3 C) (08/05 0730) Pulse Rate:  [62-72] 68 (08/05 0730) Resp:  [14-28] 14 (08/05 0730) BP: (107-132)/(53-69) 119/69 (08/05 0730) SpO2:  [93 %-100 %] 97 % (08/05 0756) Weight:  [352 lb (159.7 kg)-373 lb 10.9 oz (169.5 kg)] 373 lb 10.9 oz (169.5 kg) (08/04 1521) Last BM Date: 11/30/15 General:    Obese white female in NAD, lying in bed Heart:  Regular rate and rhythm; no murmurs Lungs: Respirations even and unlabored, lungs CTA bilaterally anteriorly Abdomen:  Obese / protuberant, Soft, nontender.  Extremities:  LE venous stasis with woody skin changes and some minor erythema Neurologic:  Alert and oriented,  grossly normal neurologically. Psych:  Cooperative. Normal mood and affect.  Intake/Output from previous day: 08/04 0701 - 08/05 0700 In: 1389.2 [P.O.:1140; I.V.:249.2] Out: [id number] [Urine:3275] Intake/Output this shift: Total I/O In: 138 [P.O.:120; I.V.:18] Out: 1050 [Urine:1050]  Lab Results:  Recent Labs  11/29/15 0457 11/30/15 0507 12/01/15 0500  WBC 7.0 4.3 4.7  HGB 7.5* 8.0* 7.8*  HCT 24.3* 27.2* 25.5*  PLT 201 197 170   BMET  Recent Labs  11/29/15 0457 11/30/15 0507 12/01/15 0500  NA 138 138 138  K 3.9 3.6 3.5  CL 109 104 103  CO2 23 24 25   GLUCOSE 94 72 91  BUN 62* 56* 54*  CREATININE 2.43* 2.31* 2.11*  CALCIUM 8.9 9.1 9.1   LFT  Recent Labs  11/29/15 0457  PROT 5.7*  ALBUMIN 2.6*  AST 19  ALT 11*  ALKPHOS 72  BILITOT 1.7*   PT/INR No results for input(s): LABPROT, INR in the last 72 hours.  Studies/Results: Dg Chest Port 1 View  Result Date: 11/29/2015 CLINICAL DATA:  Right PICC placement. EXAM: PORTABLE CHEST 1 VIEW  COMPARISON:  11/28/2015. FINDINGS: Interval right PICC extending into the inferior aspect of the superior vena cava. The tip is poorly visualized and appears to be within the inferior aspect of the superior vena cava. Stable enlarged cardiac silhouette and left subclavian pacemaker leads. The pulmonary vasculature and interstitial markings remain mildly prominent. Thoracic spine degenerative changes. IMPRESSION: 1. Right PICC tip in the inferior aspect of the superior vena cava. If a position at the superior cavoatrial junction is desired, it would be recommended that this be advanced 2 cm. 2. Stable cardiomegaly, mild pulmonary vascular congestion and mild chronic interstitial lung disease Electronically Signed   By: Claudie Revering M.D.   On: 11/29/2015 19:59       Assessment / Plan:   65 y/o female with admitted with worsening anemia on Xarelto. Colonoscopy in March showing hemorrhoids without otherwise a clear source. She has had ongoing dark stools while on oral iron. EGD on admission did not show any clear pathology for her anemia. Capsule endoscopy done to evaluate the small bowel and hope to have a result back later today with further recommendations. Discussed workup to date with patient and she verbalized understanding.   [place] Cellar, MD [facility] Pager [phone]   ADDENDUM: Capsule study completed. There is blood visualized at the very distal ileum, IC valve junction, and cecum, without a clear etiology for her bleeding noted. Given  this finding, her need for long term anticoagulation, and ongoing anemia, recommend repeat colonoscopy while inpatient. She will be on clear liquid diet tonight with bowel prep, and plans for colonoscopy tentatively tomorrow with anesthesia if possible. Discussed with patient who agreed with plan.

## 2015-12-02 ENCOUNTER — Encounter (HOSPITAL_COMMUNITY): Payer: Self-pay | Admitting: Gastroenterology

## 2015-12-02 ENCOUNTER — Inpatient Hospital Stay (HOSPITAL_COMMUNITY): Payer: Medicare Other | Admitting: Anesthesiology

## 2015-12-02 ENCOUNTER — Encounter (HOSPITAL_COMMUNITY)
Admission: EM | Disposition: A | Discharge status: Home Health | Payer: Self-pay | Source: Home / Self Care | Attending: Internal Medicine

## 2015-12-02 DIAGNOSIS — I351 Nonrheumatic aortic (valve) insufficiency: Secondary | ICD-10-CM

## 2015-12-02 DIAGNOSIS — N189 Chronic kidney disease, unspecified: Secondary | ICD-10-CM

## 2015-12-02 DIAGNOSIS — I350 Nonrheumatic aortic (valve) stenosis: Secondary | ICD-10-CM

## 2015-12-02 DIAGNOSIS — N179 Acute kidney failure, unspecified: Secondary | ICD-10-CM

## 2015-12-02 LAB — GLUCOSE, CAPILLARY
GLUCOSE-CAPILLARY: 78 mg/dL (ref 65–99)
Glucose-Capillary: 77 mg/dL (ref 65–99)
Glucose-Capillary: 79 mg/dL (ref 65–99)
Glucose-Capillary: 82 mg/dL (ref 65–99)
Glucose-Capillary: 89 mg/dL (ref 65–99)
Glucose-Capillary: 90 mg/dL (ref 65–99)
Glucose-Capillary: 98 mg/dL (ref 65–99)

## 2015-12-02 LAB — BASIC METABOLIC PANEL
ANION GAP: 10 (ref 5–15)
BUN: 48 mg/dL — AB (ref 6–20)
CALCIUM: 9 mg/dL (ref 8.9–10.3)
CO2: 25 mmol/L (ref 22–32)
Chloride: 103 mmol/L (ref 101–111)
Creatinine, Ser: 2.06 mg/dL — ABNORMAL HIGH (ref 0.44–1.00)
GFR calc Af Amer: 28 mL/min — ABNORMAL LOW (ref >60)
GFR calc non Af Amer: 24 mL/min — ABNORMAL LOW (ref >60)
GLUCOSE: 124 mg/dL — AB (ref 65–99)
POTASSIUM: 3.3 mmol/L — AB (ref 3.5–5.1)
Sodium: 138 mmol/L (ref 135–145)

## 2015-12-02 SURGERY — COLONOSCOPY
Anesthesia: Moderate Sedation

## 2015-12-02 SURGERY — CANCELLED PROCEDURE
Anesthesia: Monitor Anesthesia Care

## 2015-12-02 MED ORDER — PEG-KCL-NACL-NASULF-NA ASC-C 100 G PO SOLR
0.5000 | Freq: Once | ORAL | Status: AC
  Administered 2015-12-03: 100 g via ORAL
  Filled 2015-12-02: qty 1

## 2015-12-02 NOTE — Progress Notes (Addendum)
Call placed to Dr. Havery Moros to advise anesthesia likely available for colonoscopy around 2030. Per Dr. Havery Moros, this will be not done today and will be rescheduled. Anesthesia notified.       I spoke with the patient and discussed that other emergent procedures / surgery were going on tonight. Another case that required our endo team is needed and her case would be delayed even further. In this light her colonoscopy has been cancelled for this evening and will try to accomodate her tomorrow. She can be on clear liquid tonight, and 1/2 prep in the AM. She will be re-evaluated in the morning.   [place] Cellar, MD [facility]

## 2015-12-02 NOTE — Progress Notes (Signed)
Patient ID: Robyn Porter, female   DOB: [birth year], 65 y.o.   MRN: [id number]    Progress Note   Subjective   Tolerated prep without difficulty-stool clear Feels ok - just tired HGb 7.8 this am   Objective   Vital signs in last 24 hours: Temp:  [97.6 F (36.4 C)-98.1 F (36.7 C)] 98.1 F (36.7 C) (08/06 0700) Pulse Rate:  [63-69] 67 (08/06 0701) Resp:  [13-17] 13 (08/06 0701) BP: (123-138)/(54-70) 135/60 (08/06 0701) SpO2:  [94 %-100 %] 100 % (08/06 0701) Weight:  [352 lb (159.7 kg)-360 lb (163.3 kg)] 360 lb (163.3 kg) (08/06 0500) Last BM Date: 12/01/15 General:  Obese   white female in NAD Heart:  Regular rate and rhythm; no murmurs Lungs: Respirations even and unlabored, lungs CTA bilaterally Abdomen:  Soft, large ,nontender and nondistended. Normal bowel sounds. Extremities:  edematous Neurologic:  Alert and oriented,  grossly normal neurologically. Psych:  Cooperative. Normal mood and affect.  Intake/Output from previous day: 08/05 0701 - 08/06 0700 In: 3008 [P.O.:2720; I.V.:288] Out: 4701 [Urine:4700; Stool:1] Intake/Output this shift: Total I/O In: -  Out: 800 [Urine:800]  Lab Results:  Recent Labs  11/30/15 0507 12/01/15 0500  WBC 4.3 4.7  HGB 8.0* 7.8*  HCT 27.2* 25.5*  PLT 197 170   BMET  Recent Labs  11/30/15 0507 12/01/15 0500 12/02/15 0430  NA 138 138 138  K 3.6 3.5 3.3*  CL 104 103 103  CO2 24 25 25   GLUCOSE 72 91 124*  BUN 56* 54* 48*  CREATININE 2.31* 2.11* 2.06*  CALCIUM 9.1 9.1 9.0   LFT No results for input(s): PROT, ALBUMIN, AST, ALT, ALKPHOS, BILITOT, BILIDIR, IBILI in the last 72 hours. PT/INR No results for input(s): LABPROT, INR in the last 72 hours.  Studies/Results: No results found.     Assessment / Plan:    #1 65 yo female with progressive anemia, heme+ on xarelto  Capsule endoscopy yesterday showed some blood in very distal ileum /IC valve area She has completed prep for Colonoscopy - no active  bleeding For Colonoscopy with MAC  Today #2 anemia- stable #3 Afib #4 morbid obesity #5 CKD   Active Problems:   Chronic atrial fibrillation (H[facility])   Diabetes mellitus type 2 in obese (H[facility])   OSA (obstructive sleep apnea)   Chronic anticoagulation   Acute blood loss anemia   Chronic systolic CHF (congestive heart failure) (H[facility])   Melena   GI bleed   Dyslipidemia   Acute on chronic diastolic heart failure ([place])     LOS: 4 days   Amy [place]wood  12/02/2015, 9:08 AM

## 2015-12-02 NOTE — Anesthesia Preprocedure Evaluation (Deleted)
Anesthesia Evaluation  Patient identified by MRN, date of birth, ID band Patient awake    Reviewed: Allergy & Precautions, NPO status , Patient's Chart, lab work & pertinent test results  Airway Mallampati: II  TM Distance: >3 FB Neck ROM: Full    Dental no notable dental hx.    Pulmonary asthma , sleep apnea , COPD,    Pulmonary exam normal breath sounds clear to auscultation       Cardiovascular hypertension, +CHF  Normal cardiovascular exam+ pacemaker  Rhythm:Regular Rate:Normal  - Left ventricle: Systolic function was normal. The estimated   ejection fraction was in the range of 50% to 55%. Doppler   parameters are consistent with both elevated ventricular   end-diastolic filling pressure and elevated left atrial filling   pressure. - Aortic valve: There was moderate stenosis. There was mild to   moderate regurgitation. Valve area (VTI): 1.53 cm^2. Valve area   (Vmax): 1.57 cm^2. Valve area (Vmean): 1.43 cm^2. - Mitral valve: Calcified annulus. There was mild regurgitation. - Left atrium: The atrium was moderately dilated. - Right ventricle: The cavity size was mildly dilated. - Right atrium: The atrium was moderately dilated. - Atrial septum: No defect or patent foramen ovale was identified.    Neuro/Psych negative neurological ROS  negative psych ROS   GI/Hepatic negative GI ROS, Neg liver ROS,   Endo/Other  negative endocrine ROSdiabetes  Renal/GU Renal InsufficiencyRenal disease  negative genitourinary   Musculoskeletal negative musculoskeletal ROS (+)   Abdominal Normal abdominal exam  (+)   Peds negative pediatric ROS (+)  Hematology  (+) anemia ,   Anesthesia Other Findings   Reproductive/Obstetrics negative OB ROS                             Anesthesia Physical Anesthesia Plan  ASA: IV and emergent  Anesthesia Plan: MAC   Post-op Pain Management:    Induction:  Intravenous  Airway Management Planned: Simple Face Mask  Additional Equipment:   Intra-op Plan:   Post-operative Plan:   Informed Consent: I have reviewed the patients History and Physical, chart, labs and discussed the procedure including the risks, benefits and alternatives for the proposed anesthesia with the patient or authorized representative who has indicated his/her understanding and acceptance.   Dental advisory given  Plan Discussed with: CRNA and Surgeon  Anesthesia Plan Comments:         Anesthesia Quick Evaluation

## 2015-12-02 NOTE — Progress Notes (Signed)
Pt has home cpap and places self on/off.  Rt will monitor.

## 2015-12-02 NOTE — Progress Notes (Signed)
Patient ID: Robyn Porter, female   DOB: 12/08/1950, 65 y.o.   MRN: [id number]  PROGRESS NOTE    Robyn Porter  [id number] DOB: 03-Mar-1951 DOA: 11/28/2015  PCP: Michell Heinrich, DO   Brief Narrative:  65 yo with history of OHS/OSA, chronic systolic and diastolic CHF (last 2 D ECHO in 05/2015 showed EF 45%), atrial fibrillation on xarelto. She was admitted in 2/17 to [facility] with massive volume overload. She was aggressively diuresed in the hospital and lost around 70 lbs. She had BRBPR in the hospital, this was thought to be hemorrhoidal bleeding (had colonoscopy). She was subsequently discharged to SNF. She was admitted 09/19/15 - 09/21/15 for upgrade to CRT-P . She was noted to be markedly volume overloaded so HF team took over for diuresis. Overall diuresed 5.5 L, though weights were thought to be innacurate. Discharge weight was 363 lbs. Her weight has gone up about 10 lbs after decreasing her diuretics for worsening creatinine.    She was admitted for low hemoglobin. On admission, hemoglobin was 7.1. She has been transfused 2 U PRBC so far. GI has seen her in consultation. EGD with no acute pathology. Capsule endo results are pending and plan is for colonoscopy today.  Cardiology is following for volume overload.   Assessment & Plan:   Active Problems:   Acute blood loss anemia / Acute GI bleed / Anemia of chronic kidney disease - Hemoglobin 7.1 on admission. She also has chronic anemia due to CKD - FOBT was positive on this admission and she had dark stools (noted pt also on iron) - Xarelto stopped due to risk of bleed  - Continue protonix  - EGD done 8/3 but no pathology noted - Awaiting results of capsule endoscopy  - Plan for colonoscopy today     CKD stage 4 - Baseline Cr 2.51 about 1 month ago - Cr on this admission 2.43 --> 2.31 --> 2.11 --> 2.06     Dyslipidemia associated with type 2 DM - Continue atorvastatin 20 mg daily     OSA (obstructive sleep apnea) /  Pulmonary hypertension - CPAP at bedtime    Acute on chronic systolic and diastolic CHF  / Hypokalemia  - Last 2 D ECHO in 05/2015 with EF 40-45%, moderate pulmonary hypertension  - Appreciate cardio following - Pt on lasix drip  - CRT device interrogated and pacing appropriately - Continue daily weight and replete electrolytes - Weight since 8/4 169.5 kg --> 159.7 kg --> 163.3 kg     Chronic atrial fibrillation - CHADS vasc score 5 (HTN, age, gender, CHF, DM) - Off of xarelto due to GI bleed - Rate controlled with metoprolol 25 mg daily     Essential hypertension - Continue metoprolol 25 mg daily     Morbid obesity due to excess calories with comorbidities (HTN, DM, Dyslipidemia) - Body mass index is 61.79 kg/m. 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 She says she did not sleep good last night.   Objective: Vitals:   12/02/15 0700 12/02/15 0701 12/02/15 1019 12/02/15 1100  BP: 135/60 135/60    Pulse: 69 67    Resp: 17 13    Temp: 98.1 F (36.7 C)   97.7 F (36.5 C)  TempSrc: Oral   Oral  SpO2: 94% 100% 98%   Weight:      Height:        Intake/Output Summary (Last 24 hours) at 12/02/15 1202 Last data filed at 12/02/15 [id number]  Gross per 24 hour  Intake             2336 ml  Output             3651 ml  Net            -1315 ml   Filed Weights   11/30/15  1521 12/01/15 1500 12/02/15 0500  Weight: (!) 169.5 kg (373 lb 10.9 oz) (!) 159.7 kg (352 lb) (!) 163.3 kg (360 lb)    Examination:  General exam: Appears calm and comfortable, no acute distress  Respiratory system: Respiratory effort normal. No wheezing  Cardiovascular system: S1 & S2 heard, RRR Gastrointestinal system: Obese, non tender, (+) BS Central nervous system: No focal neurological deficits. Extremities: obese, palpable pulses, LE edema (+) Skin: warm and dry  Psychiatry: Mood & affect appropriate.     Data Reviewed: I have personally reviewed following labs and imaging studies  CBC:  Recent Labs Lab 11/28/15 1551 11/29/15 0457 11/30/15 0507 12/01/15 0500  WBC 7.0 7.0 4.3 4.7  HGB 7.1* 7.5* 8.0* 7.8*  HCT 24.1* 24.3* 27.2* 25.5*  MCV 100.0 95.7 97.8 96.6  PLT 247 201 197 [id number]   Basic Metabolic Panel:  Recent Labs Lab 11/28/15 1551 11/29/15 0457 11/30/15 0507 12/01/15 0500 12/02/15 0430  NA 138 138 138 138 138  K 4.2 3.9 3.6 3.5 3.3*  CL 106 109 104 103 103  CO2 23 23 24 25 25   GLUCOSE 131* 94 72 91 124*  BUN 61* 62* 56* 54* 48*  CREATININE 2.35* 2.43* 2.31* 2.11* 2.06*  CALCIUM 9.4 8.9 9.1 9.1 9.0  MG  --  2.2  --   --   --   PHOS  --  4.1  --   --   --    GFR: Estimated Creatinine Clearance: 41.6 mL/min (by C-G formula based on SCr of 2.06 mg/dL). Liver Function Tests:  Recent Labs Lab 11/28/15 1551 11/29/15 0457  AST 23 19  ALT 13* 11*  ALKPHOS 84 72  BILITOT 1.1 1.7*  PROT 6.4* 5.7*  ALBUMIN 2.9* 2.6*   No results for input(s): LIPASE, AMYLASE in the last 168 hours. No results for input(s): AMMONIA in the last 168 hours. Coagulation Profile: No results for input(s): INR, PROTIME in the last 168 hours. Cardiac Enzymes:  Recent Labs Lab 11/29/15 0457 11/29/15 1057  TROPONINI <0.03 <0.03   BNP (last 3 results) No results for input(s): PROBNP in the last 8760 hours. HbA1C: No results for input(s): HGBA1C in the last 72  hours. CBG:  Recent Labs Lab 12/01/15 2121 12/01/15 2350 12/02/15 0423 12/02/15 0751 12/02/15 1142  GLUCAP 96 98 89 82 79   Lipid Profile: No results for input(s): CHOL, HDL, LDLCALC, TRIG, CHOLHDL, LDLDIRECT in the last 72 hours. Thyroid Function Tests: No results for  input(s): TSH, T4TOTAL, FREET4, T3FREE, THYROIDAB in the last 72 hours. Anemia Panel: No results for input(s): VITAMINB12, FOLATE, FERRITIN, TIBC, IRON, RETICCTPCT in the last 72 hours. Urine analysis:    Component Value Date/Time   COLORURINE YELLOW 06/13/2015 0134   APPEARANCEUR CLOUDY (A) 06/13/2015 0134   LABSPEC 1.017 06/13/2015 0134   PHURINE 5.0 06/13/2015 0134   GLUCOSEU NEGATIVE 06/13/2015 0134   HGBUR NEGATIVE 06/13/2015 0134   BILIRUBINUR NEGATIVE 06/13/2015 0134   KETONESUR NEGATIVE 06/13/2015 0134   PROTEINUR 30 (A) 06/13/2015 0134   NITRITE POSITIVE (A) 06/13/2015 0134   LEUKOCYTESUR SMALL (A) 06/13/2015 0134   Sepsis Labs: @LABRCNTIP (procalcitonin:4,lacticidven:4)    Recent Results (from the past 240 hour(s))  MRSA PCR Screening     Status: None   Collection Time: 11/29/15  6:24 AM  Result Value Ref Range Status   MRSA by PCR NEGATIVE NEGATIVE Final      Radiology Studies: Dg Chest 2 View Result Date: 11/28/2015 CLINICAL DATA: No active cardiopulmonary disease. Electronically Signed   By: Marijo Conception, M.D.   On: 11/28/2015 20:10      Scheduled Meds: . sodium chloride   Intravenous Once  . acetaminophen  650 mg Oral Once  . allopurinol  100 mg Oral Daily  . atorvastatin  20 mg Oral Daily  . insulin aspart  0-9 Units Subcutaneous Q4H  . loratadine  10 mg Oral Daily  . metoprolol succinate  25 mg Oral Daily  . mometasone-formoterol  2 puff Inhalation BID  . pantoprazole  40 mg Oral BID  . potassium chloride  40 mEq Oral BID  . sodium chloride flush  10-40 mL Intracatheter Q12H  . sodium chloride flush  3 mL Intravenous Q12H   Continuous Infusions: . furosemide (LASIX)  infusion 12 mg/hr (12/02/15 0755)     LOS: 4 days    Time spent: 15 minutes  Greater than 50% of the time spent on counseling and coordinating the care.   Leisa Lenz, MD [facility] Pager [phone]  If 7PM-7AM, please contact night-coverage [email or web address] Password [email or web address] 12/02/2015, 12:02 PM

## 2015-12-02 NOTE — Progress Notes (Signed)
SUBJECTIVE: No chest pain/SOB today. Going for colonoscopy today.  Echo (11/29/15) with EF 50-55%, mildly dilated RV, moderate AS with mild to moderate AI.   ROS: Other than pertinent positives in "Subjective", all others were reviewed and found to be negative.   Intake/Output Summary (Last 24 hours) at 12/02/15 1005 Last data filed at 12/02/15 [id number]  Gross per 24 hour  Intake             2600 ml  Output             4451 ml  Net            -1851 ml    Current Facility-Administered Medications  Medication Dose Route Frequency Provider Last Rate Last Dose  . 0.9 %  sodium chloride infusion   Intravenous Once Pattricia Boss, MD      . acetaminophen (TYLENOL) tablet 650 mg  650 mg Oral Q6H PRN Toy Baker, MD       Or  . acetaminophen (TYLENOL) suppository 650 mg  650 mg Rectal Q6H PRN Toy Baker, MD      . acetaminophen (TYLENOL) tablet 650 mg  650 mg Oral Once Toy Baker, MD      . allopurinol (ZYLOPRIM) tablet 100 mg  100 mg Oral Daily Toy Baker, MD   100 mg at 12/02/15 0939  . atorvastatin (LIPITOR) tablet 20 mg  20 mg Oral Daily Toy Baker, MD   20 mg at 12/02/15 0939  . furosemide (LASIX) 250 mg in dextrose 5 % 250 mL (1 mg/mL) infusion  12 mg/hr Intravenous Continuous Shirley Friar, PA-C 12 mL/hr at 12/02/15 0755 12 mg/hr at 12/02/15 0755  . HYDROcodone-acetaminophen (NORCO/VICODIN) 5-325 MG per tablet 1-2 tablet  1-2 tablet Oral Q4H PRN Toy Baker, MD      . insulin aspart (novoLOG) injection 0-9 Units  0-9 Units Subcutaneous Q4H Toy Baker, MD   1 Units at 12/01/15 1250  . ipratropium-albuterol (DUONEB) 0.5-2.5 (3) MG/3ML nebulizer solution 3 mL  3 mL Nebulization Q6H PRN Toy Baker, MD      . loratadine (CLARITIN) tablet 10 mg  10 mg Oral Daily Toy Baker, MD   10 mg at 12/02/15 0939  . metoprolol succinate (TOPROL-XL) 24 hr tablet 25 mg  25 mg Oral Daily Larey Dresser, MD   25 mg at 12/02/15  [id number]  . mometasone-formoterol (DULERA) 100-5 MCG/ACT inhaler 2 puff  2 puff Inhalation BID Toy Baker, MD   2 puff at 12/01/15 2107  . ondansetron (ZOFRAN) tablet 4 mg  4 mg Oral Q6H PRN Toy Baker, MD       Or  . ondansetron (ZOFRAN) injection 4 mg  4 mg Intravenous Q6H PRN Toy Baker, MD      . pantoprazole (PROTONIX) EC tablet 40 mg  40 mg Oral BID Larey Dresser, MD   40 mg at 12/02/15 [id number]  . potassium chloride SA (K-DUR,KLOR-CON) CR tablet 40 mEq  40 mEq Oral BID Satira Mccallum Tillery, PA-C   40 mEq at 12/02/15 [id number]  . sodium chloride flush (NS) 0.9 % injection 10-40 mL  10-40 mL Intracatheter Q12H Robbie Lis, MD   20 mL at 12/02/15 0943  . sodium chloride flush (NS) 0.9 % injection 10-40 mL  10-40 mL Intracatheter PRN Robbie Lis, MD      . sodium chloride flush (NS) 0.9 % injection 3 mL  3 mL Intravenous Q12H Toy Baker, MD   3  mL at 11/29/15 2200    Vitals:   12/02/15 0414 12/02/15 0500 12/02/15 0700 12/02/15 0701  BP: 127/63  135/60 135/60  Pulse: 64  69 67  Resp: 16  17 13   Temp: 97.7 F (36.5 C)  98.1 F (36.7 C)   TempSrc: Oral  Oral   SpO2: 95%  94% 100%  Weight:  (!) 360 lb (163.3 kg)    Height:        PHYSICAL EXAM General: NAD Neck: Thick, JVP difficult to estimate Lungs: Diminished basilar sounds.  CV: Nondisplaced PMI. Heart regular S1/S2, no [id number], 3/6 systolic crescendo-decrescendo murmur with obscured S2. 1+ edema to thighs (wrinkling in pretibial region noted today) Abdomen: Morbidly obese, soft, NT, ND Skin: Stasis dermatitis of legs. Neurologic: Alert and oriented x 3.  Psych: Normal affect. Extremities: No clubbing or cyanosis.  HEENT: Normal.   LABS: Basic Metabolic Panel:  Recent Labs  12/01/15 0500 12/02/15 0430  NA 138 138  K 3.5 3.3*  CL 103 103  CO2 25 25  GLUCOSE 91 124*  BUN 54* 48*  CREATININE 2.11* 2.06*  CALCIUM 9.1 9.0   Liver Function Tests: No results for input(s): AST, ALT,  ALKPHOS, BILITOT, PROT, ALBUMIN in the last 72 hours. No results for input(s): LIPASE, AMYLASE in the last 72 hours. CBC:  Recent Labs  11/30/15 0507 12/01/15 0500  WBC 4.3 4.7  HGB 8.0* 7.8*  HCT 27.2* 25.5*  MCV 97.8 96.6  PLT 197 170   Cardiac Enzymes:  Recent Labs  11/29/15 1057  TROPONINI <0.03   BNP: Invalid input(s): POCBNP D-Dimer: No results for input(s): DDIMER in the last 72 hours. Hemoglobin A1C: No results for input(s): HGBA1C in the last 72 hours. Fasting Lipid Panel: No results for input(s): CHOL, HDL, LDLCALC, TRIG, CHOLHDL, LDLDIRECT in the last 72 hours. Thyroid Function Tests: No results for input(s): TSH, T4TOTAL, T3FREE, THYROIDAB in the last 72 hours.  Invalid input(s): FREET3 Anemia Panel: No results for input(s): VITAMINB12, FOLATE, FERRITIN, TIBC, IRON, RETICCTPCT in the last 72 hours.  RADIOLOGY: Dg Chest 2 View  Result Date: 11/28/2015 CLINICAL DATA:  Congestion, weakness. EXAM: CHEST  2 VIEW COMPARISON:  Radiographs of Sep 20, 2015. FINDINGS: Mild cardiomegaly is noted. Left-sided pacemaker is unchanged in position. No pneumothorax or pleural effusion is noted. No acute pulmonary disease is noted. Bony thorax is unremarkable. IMPRESSION: No active cardiopulmonary disease. Electronically Signed   By: Marijo Conception, M.D.   On: 11/28/2015 20:10   Dg Chest Port 1 View  Result Date: 11/29/2015 CLINICAL DATA:  Right PICC placement. EXAM: PORTABLE CHEST 1 VIEW COMPARISON:  11/28/2015. FINDINGS: Interval right PICC extending into the inferior aspect of the superior vena cava. The tip is poorly visualized and appears to be within the inferior aspect of the superior vena cava. Stable enlarged cardiac silhouette and left subclavian pacemaker leads. The pulmonary vasculature and interstitial markings remain mildly prominent. Thoracic spine degenerative changes. IMPRESSION: 1. Right PICC tip in the inferior aspect of the superior vena cava. If a position at  the superior cavoatrial junction is desired, it would be recommended that this be advanced 2 cm. 2. Stable cardiomegaly, mild pulmonary vascular congestion and mild chronic interstitial lung disease Electronically Signed   By: Claudie Revering M.D.   On: 11/29/2015 19:59      ASSESSMENT AND PLAN: 65 yo with morbid obesity, chronic primarily diastolic CHF (EF [id number]), CKD stage III was admitted with anemia and suspected GI bleeding, also with  suspected CHF exacerbation given significant weight gain.   1. GI bleeding/anemia: Heme+. Has dark stools but also on iron. Has had periodic hematochezia that she attributes to hemorrhoids. Had PRBCs transfusion. EGD on 8/3 with no source of bleeding.  - Xarelto on hold. On PPI.  - Capsule Endo completed, colonoscopy today.   2. Acute on chronic primarily diastolic CHF: EF [id number] with mild RV dilationon echo 11/29/15. PICC Line placed. CVP >20. She remains markedly volume overloaded.  - Will continue Lasix drip at 12 mg/hr. She has had at least 2.5 L output in last 24+ hrs (4.4 L in last 48 hrs).  - Continue Toprol XL at 25 mg daily, BP is ok.  - CRT device interrogated, she is BiV pacing appropriately (94% BiV pacing) and EF is higher than prior.  - Continue daily weights and strict I/Os.   3. AKI on CKD Stage III: Continue to follow closely with diuresis. BUN/creatinine lower today.  - She has renal appointment scheduled for 12/12/15 with [facility] - Dr. Hollie Salk - If run into difficulty diuresis or rise in creatinine, will consider renal consult in house.   4. Murmur: Moderate AS with mild/mod AI on Echo 11/29/15.   5. Bradycardia: MDT CRT-P device functioning appropriately.   6. Atrial fibrillation: Chronic -off Xarelto for the time being with possible GI bleeding.  - Rate controlled.    Kate Sable, M.D., F.A.C.C.

## 2015-12-03 ENCOUNTER — Inpatient Hospital Stay (HOSPITAL_COMMUNITY): Payer: Medicare Other | Admitting: Anesthesiology

## 2015-12-03 ENCOUNTER — Encounter (HOSPITAL_COMMUNITY): Payer: Self-pay

## 2015-12-03 ENCOUNTER — Encounter (HOSPITAL_COMMUNITY)
Admission: EM | Disposition: A | Discharge status: Home Health | Payer: Self-pay | Source: Home / Self Care | Attending: Internal Medicine

## 2015-12-03 DIAGNOSIS — K5520 Angiodysplasia of colon without hemorrhage: Secondary | ICD-10-CM

## 2015-12-03 HISTORY — PX: COLONOSCOPY: SHX5424

## 2015-12-03 HISTORY — PX: HOT HEMOSTASIS: SHX5433

## 2015-12-03 LAB — GLUCOSE, CAPILLARY
GLUCOSE-CAPILLARY: 72 mg/dL (ref 65–99)
GLUCOSE-CAPILLARY: 146 mg/dL — AB (ref 65–99)
Glucose-Capillary: 86 mg/dL (ref 65–99)
Glucose-Capillary: 90 mg/dL (ref 65–99)
Glucose-Capillary: 96 mg/dL (ref 65–99)

## 2015-12-03 LAB — CBC
HCT: 24.8 % — ABNORMAL LOW (ref 36.0–46.0)
HEMOGLOBIN: 7.4 g/dL — AB (ref 12.0–15.0)
MCH: 28.9 pg (ref 26.0–34.0)
MCHC: 29.8 g/dL — ABNORMAL LOW (ref 30.0–36.0)
MCV: 96.9 fL (ref 78.0–100.0)
Platelets: 142 10*3/uL — ABNORMAL LOW (ref 150–400)
RBC: 2.56 MIL/uL — AB (ref 3.87–5.11)
RDW: 18.3 % — ABNORMAL HIGH (ref 11.5–15.5)
WBC: 4 10*3/uL (ref 4.0–10.5)

## 2015-12-03 LAB — POCT I-STAT, CHEM 8
BUN: 40 mg/dL — AB (ref 6–20)
CREATININE: 2 mg/dL — AB (ref 0.44–1.00)
Calcium, Ion: 1.19 mmol/L (ref 1.12–1.23)
Chloride: 102 mmol/L (ref 101–111)
GLUCOSE: 85 mg/dL (ref 65–99)
HEMATOCRIT: 28 % — AB (ref 36.0–46.0)
Hemoglobin: 9.5 g/dL — ABNORMAL LOW (ref 12.0–15.0)
POTASSIUM: 3.6 mmol/L (ref 3.5–5.1)
Sodium: 140 mmol/L (ref 135–145)
TCO2: 24 mmol/L (ref 0–100)

## 2015-12-03 LAB — BASIC METABOLIC PANEL
ANION GAP: 9 (ref 5–15)
BUN: 42 mg/dL — ABNORMAL HIGH (ref 6–20)
CALCIUM: 9 mg/dL (ref 8.9–10.3)
CO2: 27 mmol/L (ref 22–32)
Chloride: 101 mmol/L (ref 101–111)
Creatinine, Ser: 1.97 mg/dL — ABNORMAL HIGH (ref 0.44–1.00)
GFR, EST AFRICAN AMERICAN: 29 mL/min — AB (ref >60)
GFR, EST NON AFRICAN AMERICAN: 25 mL/min — AB (ref >60)
Glucose, Bld: 87 mg/dL (ref 65–99)
Potassium: 3.4 mmol/L — ABNORMAL LOW (ref 3.5–5.1)
Sodium: 137 mmol/L (ref 135–145)

## 2015-12-03 LAB — MAGNESIUM: MAGNESIUM: 1.8 mg/dL (ref 1.7–2.4)

## 2015-12-03 SURGERY — COLONOSCOPY
Anesthesia: Monitor Anesthesia Care

## 2015-12-03 MED ORDER — LACTATED RINGERS IV SOLN
INTRAVENOUS | Status: DC | PRN
  Administered 2015-12-03: 13:00:00 via INTRAVENOUS

## 2015-12-03 MED ORDER — PHENYLEPHRINE HCL 10 MG/ML IJ SOLN
INTRAMUSCULAR | Status: DC | PRN
  Administered 2015-12-03: 80 ug via INTRAVENOUS

## 2015-12-03 MED ORDER — MIDAZOLAM HCL 5 MG/5ML IJ SOLN
INTRAMUSCULAR | Status: DC | PRN
  Administered 2015-12-03: 2 mg via INTRAVENOUS

## 2015-12-03 MED ORDER — PROPOFOL 500 MG/50ML IV EMUL
INTRAVENOUS | Status: DC | PRN
  Administered 2015-12-03: 75 ug/kg/min via INTRAVENOUS

## 2015-12-03 MED ORDER — PROPOFOL 10 MG/ML IV BOLUS
INTRAVENOUS | Status: DC | PRN
  Administered 2015-12-03: 30 mg via INTRAVENOUS

## 2015-12-03 MED ORDER — POTASSIUM CHLORIDE CRYS ER 20 MEQ PO TBCR
40.0000 meq | EXTENDED_RELEASE_TABLET | Freq: Once | ORAL | Status: AC
  Administered 2015-12-03: 40 meq via ORAL
  Filled 2015-12-03: qty 2

## 2015-12-03 NOTE — Progress Notes (Signed)
Advanced Heart Failure Rounding Note  Primary Cardiologist: Aundra Dubin Reason for Consultation: CHF  Subjective:    Admitted 11/28/15 with anemia/Hgb down to 7.1.  GI work up ongoing. Also noted to have marked volume overload. Scheduled to have Colonoscopy.   Feeling better. Still has some DOE with walking halls. Has to rest often.   Out 3.0 L and down 3 lbs. On lasix 12 mg/hr. K 3.4  Echo (11/29/15) with EF 50-55%, mildly dilated RV, moderate AS with mild to moderate AI.   Objective:   Weight Range: (!) 357 lb 8 oz (162.2 kg) Body mass index is 61.36 kg/m.   Vital Signs:   Temp:  [97.3 F (36.3 C)-98 F (36.7 C)] 97.8 F (36.6 C) (08/07 0749) Pulse Rate:  [60-70] 64 (08/07 0749) Resp:  [11-17] 14 (08/07 0749) BP: (103-139)/(46-79) 134/65 (08/07 0749) SpO2:  [93 %-99 %] 99 % (08/07 0749) Weight:  [357 lb 8 oz (162.2 kg)] 357 lb 8 oz (162.2 kg) (08/07 0800) Last BM Date: 12/02/15  Weight change: Filed Weights   12/01/15 1500 12/02/15 0500 12/03/15 0800  Weight: (!) 352 lb (159.7 kg) (!) 360 lb (163.3 kg) (!) 357 lb 8 oz (162.2 kg)    Intake/Output:   Intake/Output Summary (Last 24 hours) at 12/03/15 0814 Last data filed at 12/03/15 0758  Gross per 24 hour  Intake             1038 ml  Output             5300 ml  Net            -4262 ml     Physical Exam: CVP CVP 11-12 General: NAD Neck: Thick, JVP difficult but elevated, no thyromegaly or thyroid nodule.  Lungs: Diminished basilar sounds.  CV: Nondisplaced PMI.  Heart regular S1/S2, no [id number], 3/6 systolic crescendo-decrescendo murmur with obscured S2.  - Trace to 1+ edema half to knees.  No carotid bruit.  Normal pedal pulses.  Abdomen: Morbidly obese, soft, non-tender, non-distended, no HSM. No bruits or masses. +BS  Skin: Intact without lesions or rashes.  Neurologic: Alert and oriented x 3.  Psych: Normal affect. Extremities: No clubbing or cyanosis.  HEENT: Normal.   Telemetry: Reviewed personally, Afib  60-70s  Labs: CBC  Recent Labs  12/01/15 0500 12/03/15 0415  WBC 4.7 4.0  HGB 7.8* 7.4*  HCT 25.5* 24.8*  MCV 96.6 96.9  PLT 170 [id number]*   Basic Metabolic Panel  Recent Labs  12/02/15 0430 12/03/15 0415  NA 138 137  K 3.3* 3.4*  CL 103 101  CO2 25 27  GLUCOSE 124* 87  BUN 48* 42*  CREATININE 2.06* 1.97*  CALCIUM 9.0 9.0  MG  --  1.8   Liver Function Tests No results for input(s): AST, ALT, ALKPHOS, BILITOT, PROT, ALBUMIN in the last 72 hours. No results for input(s): LIPASE, AMYLASE in the last 72 hours. Cardiac Enzymes No results for input(s): CKTOTAL, CKMB, CKMBINDEX, TROPONINI in the last 72 hours.  BNP: BNP (last 3 results)  Recent Labs  06/12/15 2328 07/09/15 1158 07/19/15 1124  BNP 940.9* 133.0* 149.0*    ProBNP (last 3 results) No results for input(s): PROBNP in the last 8760 hours.   D-Dimer No results for input(s): DDIMER in the last 72 hours. Hemoglobin A1C No results for input(s): HGBA1C in the last 72 hours. Fasting Lipid Panel No results for input(s): CHOL, HDL, LDLCALC, TRIG, CHOLHDL, LDLDIRECT in the last 72 hours. Thyroid  Function Tests No results for input(s): TSH, T4TOTAL, T3FREE, THYROIDAB in the last 72 hours.  Invalid input(s): FREET3  Other results:     Imaging/Studies:  No results found.  Latest Echo  Latest Cath   Medications:     Scheduled Medications: . sodium chloride   Intravenous Once  . acetaminophen  650 mg Oral Once  . allopurinol  100 mg Oral Daily  . atorvastatin  20 mg Oral Daily  . insulin aspart  0-9 Units Subcutaneous Q4H  . loratadine  10 mg Oral Daily  . metoprolol succinate  25 mg Oral Daily  . mometasone-formoterol  2 puff Inhalation BID  . pantoprazole  40 mg Oral BID  . potassium chloride  40 mEq Oral BID  . sodium chloride flush  10-40 mL Intracatheter Q12H  . sodium chloride flush  3 mL Intravenous Q12H    Infusions: . furosemide (LASIX) infusion 12 mg/hr (12/03/15 0600)     PRN Medications: acetaminophen **OR** acetaminophen, HYDROcodone-acetaminophen, ipratropium-albuterol, ondansetron **OR** ondansetron (ZOFRAN) IV, sodium chloride flush   Assessment/Plan   65 yo with morbid obesity, chronic primarily diastolic CHF (EF 67 with mild RV dilation, CKD stage III was admitted with anemia and suspected GI bleeding, also with suspected CHF exacerbation given significant weight gain.   1. GI bleeding/anemia: Heme+.  Has dark stools but also on iron.  Has had periodic hematochezia that she attributes to hemorrhoids.  Has had 2 units PRBCs so far.  EGD on 8/3 with no source of bleeding.  - Capsule endo with blood in very distal ileum/IC valve area.  Prepped for Colonoscopy.   - Xarelto on hold. On PPI.  2. Acute on chronic primarily diastolic CHF: EF [id number] with mild RV dilation on echo 11/29/15.  PICC Line placed.  - CVP 11-12 She remains volume overloaded on exam.   - Continue lasix drip at 12 mg/hr for today.  Can likely change to po in am. Supp K.  - Continue Toprol XL 25 mg daily. - CRT device interrogated, she is BiV pacing appropriately (94% BiV pacing) and EF is higher than prior.  - Need daily weights and strict I/Os.  3. AKI on CKD Stage III: - BUN/Creatinine trending down. Continue to follow closely with diuresis.  - Marked anemia ( initial Hgb in 6 range at PCP) likely contributed to AKI as well. - She has renal appointment scheduled for 12/12/15 with [facility] - Dr. Hollie Salk - If run into difficulty diuresis or rise in creatinine, will consider renal consult in house.  4. Murmur: Moderate AS with mild/mod AI  on Echo 11/29/15.   5. Bradycardia: MDT CRT-P device functioning appropriately.  6. Atrial fibrillation: Chronic - off Xarelto for the time being with GI bleeding work up ongoing. - Rate controlled.  Length of Stay: 5  Shirley Friar PA-C 12/03/2015, 8:14 AM  Advanced Heart Failure Team Pager [phone] (M-F; 7a - 4p)  Please contact [facility]  for night-coverage after hours (4p -7a ) and weekends on [email or web address]    Patient seen and examined with Oda Kilts, PA-C. We discussed all aspects of the encounter. I agree with the assessment and plan as stated above.   She continues to diurese well. Renal function improving. Will continue lasix gtt. Remains anemic. Colonoscopy results reviewed. Several colonic lesions that were treated with APC. Hopefully we can resume Xarelto in the near future for AF. Supp K+   Karyss Frese,MD 9:53 PM

## 2015-12-03 NOTE — Anesthesia Preprocedure Evaluation (Addendum)
Anesthesia Evaluation  Patient identified by MRN, date of birth, ID band Patient awake    Reviewed: Allergy & Precautions, NPO status , Patient's Chart, lab work & pertinent test results  Airway Mallampati: I  TM Distance: >3 FB Neck ROM: Full    Dental   Pulmonary asthma , sleep apnea , COPD,    Pulmonary exam normal        Cardiovascular hypertension, +CHF  Normal cardiovascular exam+ dysrhythmias Atrial Fibrillation + pacemaker (St. Jude) + Valvular Problems/Murmurs AS and AI   TTE 11/29/2015: Study Conclusions  - Left ventricle: Systolic function was normal. The estimated   ejection fraction was in the range of 50% to 55%. Doppler   parameters are consistent with both elevated ventricular   end-diastolic filling pressure and elevated left atrial filling   pressure. - Aortic valve: There was moderate stenosis. There was mild to   moderate regurgitation. Valve area (VTI): 1.53 cm^2. Valve area   (Vmax): 1.57 cm^2. Valve area (Vmean): 1.43 cm^2. - Mitral valve: Calcified annulus. There was mild regurgitation. - Left atrium: The atrium was moderately dilated. - Right ventricle: The cavity size was mildly dilated. - Right atrium: The atrium was moderately dilated. - Atrial septum: No defect or patent foramen ovale was identified.   Neuro/Psych    GI/Hepatic melena   Endo/Other  diabetes  Renal/GU CRFRenal disease     Musculoskeletal   Abdominal   Peds  Hematology  (+) Blood dyscrasia (Hgb 7.4), anemia ,   Anesthesia Other Findings   Reproductive/Obstetrics                         Anesthesia Physical Anesthesia Plan  ASA: III  Anesthesia Plan: MAC   Post-op Pain Management:    Induction: Intravenous  Airway Management Planned: Natural Airway and Nasal Cannula  Additional Equipment:   Intra-op Plan:   Post-operative Plan:   Informed Consent: I have reviewed the patients History  and Physical, chart, labs and discussed the procedure including the risks, benefits and alternatives for the proposed anesthesia with the patient or authorized representative who has indicated his/her understanding and acceptance.   Dental advisory given  Plan Discussed with: CRNA and Surgeon  Anesthesia Plan Comments:        Anesthesia Quick Evaluation

## 2015-12-03 NOTE — Care Management Important Message (Signed)
Important Message  Patient Details  Name: Robyn Porter MRN: [id number] Date of Birth: [birth year]   Medicare Important Message Given:  Yes    Cherylanne Ardelean [name] 12/03/2015, 11:05 AM

## 2015-12-03 NOTE — Progress Notes (Signed)
Occupational Therapy Treatment Patient Details Name: Robyn Porter MRN: [id number] DOB: 08-30-1950 Today's Date: 12/03/2015    History of present illness Robyn Porter is a 65 y.o. female with medical history significant of OHS/OSA, chronic systolic CHF and chronic atrial fibrillation on Xarelto. Found to have: worsening anemia and ascending of fluid overload recent cellulitis Hemoccult positive stools in the setting of chronic melena   OT comments  This 65 yo female admitted with above presents to acute OT making progress with mobility and her ability to transfer to 3n1. She will continue to benefit from acute OT with current D/C recommendation being SNF.  Follow Up Recommendations  SNF;Other (comment) (if progress well may could be upgraded to [facility])    Equipment Recommendations  None recommended by OT       Precautions / Restrictions Precautions Precautions: None Restrictions Weight Bearing Restrictions: No       Mobility Bed Mobility Overal bed mobility: Needs Assistance Bed Mobility: Sidelying to Sit;Supine to Sit;Sit to Supine   Sidelying to sit: Supervision Supine to sit: Supervision;Min guard Sit to supine: Supervision   General bed mobility comments: HOB up used rail and took increased time   Transfers Overall transfer level: Needs assistance Equipment used: Rolling walker (2 wheeled) Transfers: Sit to/from [facility] Sit to Stand: Min guard;+2 physical assistance   Squat pivot transfers: Min guard     General transfer comment: sit to stand +2 min guard  for safety    Balance Overall balance assessment: Needs assistance Sitting-balance support: Feet supported;No upper extremity supported Sitting balance-Leahy Scale: Fair Sitting balance - Comments: able to support herself indepedently with bilateral UE support, less stable without UEs    Standing balance support: During functional activity;Bilateral upper extremity supported Standing  balance-Leahy Scale: Poor Standing balance comment: Needs support with a least one UE support when up on her feet                   ADL Overall ADL's : Needs assistance/impaired                         Toilet Transfer: Min guard;+2 for safety/equipment;Stand-pivot;BSC   Toileting- Clothing Manipulation and Hygiene: Total assistance (min guard A sit<>stand)                          Cognition   Behavior During Therapy: WFL for tasks assessed/performed Overall Cognitive Status: Within Functional Limits for tasks assessed                                    Pertinent Vitals/ Pain       Pain Assessment: No/denies pain         Frequency Min 2X/week     Progress Toward Goals  OT Goals(current goals can now be found in the care plan section)  Progress towards OT goals: Progressing toward goals  Acute Rehab OT Goals Patient Stated Goal: to be able to go home and go on a vacation she has planned for September  Plan Discharge plan remains appropriate    Co-evaluation    PT/OT/SLP Co-Evaluation/Treatment: Yes Reason for Co-Treatment: For patient/therapist safety PT goals addressed during session: Mobility/safety with mobility;Balance OT goals addressed during session: Strengthening/ROM      End of Session Equipment Utilized During Treatment: Gait belt;Rolling walker   Activity Tolerance Patient tolerated  treatment well   Patient Left in chair;with call bell/phone within reach   Nurse Communication Mobility status (how to manage bariatric recliner foot plates)        Time: [id number] OT Time Calculation (min): 31 min  Charges: OT General Charges $OT Visit: 1 Procedure OT Treatments $Self Care/Home Management : 8-22 mins  [name] [id number] 12/03/2015, 11:09 AM

## 2015-12-03 NOTE — Op Note (Signed)
[facility] Patient Name: Robyn Porter Procedure Date : 12/03/2015 MRN: [id number] Attending MD: Gatha Mayer , MD Date of Birth: [birth year] CSN: [id number] Age: 65 Admit Type: Inpatient Procedure:                Colonoscopy Indications:              Evaluation of unexplained GI bleeding, Melena Providers:                Gatha Mayer, MD, Kingsley Plan, RN, Corliss Parish, Technician Referring MD:              Medicines:                Monitored Anesthesia Care Complications:            No immediate complications. Estimated Blood Loss:     Estimated blood loss: none. Procedure:                Pre-Anesthesia Assessment:                           - Prior to the procedure, a History and Physical                            was performed, and patient medications and                            allergies were reviewed. The patient's tolerance of                            previous anesthesia was also reviewed. The risks                            and benefits of the procedure and the sedation                            options and risks were discussed with the patient.                            All questions were answered, and informed consent                            was obtained. Prior Anticoagulants: The patient                            last took Xarelto (rivaroxaban) 5 days prior to the                            procedure. ASA Grade Assessment: III - A patient                            with severe systemic disease. After reviewing the  risks and benefits, the patient was deemed in                            satisfactory condition to undergo the procedure.                           After obtaining informed consent, the colonoscope                            was passed under direct vision. Throughout the                            procedure, the patient's blood pressure, pulse, and      oxygen saturations were monitored continuously. The                            EC-[id number]) scope was introduced through                            the anus and advanced to the the terminal ileum,                            with identification of the appendiceal orifice and                            IC valve. The colonoscopy was performed without                            difficulty. The patient tolerated the procedure                            well. The quality of the bowel preparation was                            good. The terminal ileum, ileocecal valve,                            appendiceal orifice, and rectum were photographed. Scope In: 12:22:12 PM Scope Out: 1:43:59 PM Scope Withdrawal Time: 0 hours 11 minutes 11 seconds  Total Procedure Duration: 1 hour 21 minutes 47 seconds  Findings:      The perianal and digital rectal examinations were normal.      The terminal ileum appeared normal.      A few small angiodysplastic lesions with stigmata of recent bleeding       were found in the cecum. there was a mix of what looked like       angiodysplasia and traumatic submucosal heme. Given findings at capsule       endoscopy with bleeding in this area I elelcted to treat. the capsule       enodscopw was in the cecum and moved to transverse colon with Jabier Mutton net       prior to therapy. Coagulation for hemostasis using argon plasma was       successful. Also one small ascending AVM ablated Estimated blood loss:       none.  The exam was otherwise without abnormality on direct and retroflexion       views. Impression:               - The examined portion of the ileum was normal.                           - A few recently bleeding colonic angiodysplastic                            lesions. Treated with argon plasma coagulation                            (APC).                           - Capsule endoscope seen in cecum and moved to                            transverse  colon.                           - The examination was otherwise normal on direct                            and retroflexion views.                           - No specimens collected. Photos were not captured                            due to malfunction of equipment. Moderate Sedation:      Please see anesthesia notes, moderate sedation not given Recommendation:           - Return patient to hospital ward for ongoing care.                           - Cardiac diet.                           - Resume Xarelto (rivaroxaban) at prior dose in 7                            days. Refer to managing physician for further                            adjustment of therapy.                           GI signing off - see [place] prn.                           - - No repeat colonoscopy due to age and                            co-morbidities Procedure Code(s):        --- Professional ---  45382, Colonoscopy, flexible; with control of                            bleeding, any method Diagnosis Code(s):        --- Professional ---                           K55.21, Angiodysplasia of colon with hemorrhage                           K92.2, Gastrointestinal hemorrhage, unspecified                           K92.1, Melena (includes Hematochezia) CPT copyright 2016 American [employer] Association. All rights reserved. The codes documented in this report are preliminary and upon coder review may  be revised to meet current compliance requirements. Gatha Mayer, MD 12/03/2015 2:00:01 PM This report has been signed electronically. Number of Addenda: 0

## 2015-12-03 NOTE — Anesthesia Procedure Notes (Signed)
Procedure Name: MAC Date/Time: 12/03/2015 1:22 PM Performed by: Kyung Rudd Pre-anesthesia Checklist: Patient identified, Emergency Drugs available, Suction available, Patient being monitored and Timeout performed Patient Re-evaluated:Patient Re-evaluated prior to inductionOxygen Delivery Method: Simple face mask Intubation Type: IV induction Placement Confirmation: positive ETCO2

## 2015-12-03 NOTE — Progress Notes (Signed)
Patient ID: Robyn Porter, female   DOB: 1950/11/12, 65 y.o.   MRN: [id number]  PROGRESS NOTE    Robyn Porter  [id number] DOB: 01/02/1951 DOA: 11/28/2015  PCP: Michell Heinrich, DO   Brief Narrative:  65 yo with history of OHS/OSA, chronic systolic and diastolic CHF (last 2 D ECHO in 05/2015 showed EF 45%), atrial fibrillation on xarelto. She was admitted in 2/17 to [facility] with massive volume overload. She was aggressively diuresed in the hospital and lost around 70 lbs. She had BRBPR in the hospital, this was thought to be hemorrhoidal bleeding (had colonoscopy). She was subsequently discharged to SNF. She was admitted 09/19/15 - 09/21/15 for upgrade to CRT-P . She was noted to be markedly volume overloaded so HF team took over for diuresis. Overall diuresed 5.5 L, though weights were thought to be innacurate. Discharge weight was 363 lbs. Her weight has gone up about 10 lbs after decreasing her diuretics for worsening creatinine.    She was admitted for low hemoglobin. On admission, hemoglobin was 7.1. She has been transfused 2 U PRBC so far. GI has seen her in consultation. EGD with no acute pathology. Capsule endo results are pending and plan is for colonoscopy today.  Cardiology is following for volume overload.   Assessment & Plan:   Active Problems:   Acute blood loss anemia / Acute GI bleed / Anemia of chronic kidney disease - Hemoglobin 7.1 on admission. She also has chronic anemia due to CKD - FOBT was positive on this admission and she had dark stools (noted pt also on iron) - Xarelto stopped due to risk of bleed  - Continue protonix  - EGD done 8/3 but no pathology noted - Capsule endoscopy showed some blood in very distal ileum /IC valve area - Plan for colonoscopy today     CKD stage 4 - Baseline Cr 2.51 about 1 month ago - Cr during this hospital stay so far within baseline range     Dyslipidemia associated with type 2 DM - Continue atorvastatin 20 mg daily     OSA (obstructive sleep apnea) / Pulmonary hypertension - CPAP at bedtime    Acute on chronic systolic and diastolic CHF  / Hypokalemia  - Last 2 D ECHO in 05/2015 with EF 40-45%, moderate pulmonary hypertension  - Appreciate cardio following - Pt on lasix drip  - CRT device interrogated and pacing appropriately - Continue daily weight and strict intake and output     Chronic atrial fibrillation - CHADS vasc score 5 (HTN, age, gender, CHF, DM) - Off of xarelto due to GI bleed - Rate controlled with metoprolol 25 mg daily     Essential hypertension - Continue metoprolol 25 mg daily     Morbid obesity due to excess calories with comorbidities (HTN, DM, Dyslipidemia) - Body mass index is 61.28 kg/m. 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   Objective: Vitals:   12/03/15 1100 12/03/15 1238 12/03/15 1354 12/03/15 1400  BP: (!) 116/56 (!) 148/49 (!) 122/49 (!) 138/50  Pulse: 65 66 68 64  Resp: 15 12 16 15   Temp: 97.5 F (36.4 C) 98.4 F (36.9 C) 98.3 F (36.8 C)   TempSrc: Oral Oral Oral   SpO2: 96% 100% 99% 96%  Weight:  (!) 161.9 kg (357 lb)    Height:  5\' 4"  (1.626 m)      Intake/Output Summary (Last 24 hours) at 12/03/15 1415 Last data filed at 12/03/15 1355  Gross per 24 hour  Intake             1138 ml  Output             4780 ml  Net            -3642 ml   Filed Weights   12/02/15 0500  12/03/15 0800 12/03/15 1238  Weight: (!) 163.3 kg (360 lb) (!) 162.2 kg (357 lb 8 oz) (!) 161.9 kg (357 lb)    Examination:  General exam: no acute distress  Respiratory system: No wheezing, no rhonchi  Cardiovascular system: S1 & S2 heard, rate controlled  Gastrointestinal system: Obese, non tender, (+) BS Central nervous system: Nonfocal  Extremities: obese, palpable pulses, LE edema (+) Skin: no ulcers or lesions  Psychiatry: Normal mood and behavior     Data Reviewed: I have personally reviewed following labs and imaging studies  CBC:  Recent Labs Lab 11/28/15 1551 11/29/15 0457 11/30/15 0507 12/01/15 0500 12/03/15 0415 12/03/15 1308  WBC 7.0 7.0 4.3 4.7 4.0  --   HGB 7.1* 7.5* 8.0* 7.8* 7.4* 9.5*  HCT 24.1* 24.3* 27.2* 25.5* 24.8* 28.0*  MCV 100.0 95.7 97.8 96.6 96.9  --   PLT 247 201 197 170 142*  --    Basic Metabolic Panel:  Recent Labs Lab 11/29/15 0457 11/30/15 0507 12/01/15 0500 12/02/15 0430 12/03/15 0415 12/03/15 1308  NA 138 138 138 138 137 140  K 3.9 3.6 3.5 3.3* 3.4* 3.6  CL 109 104 103 103 101 102  CO2 23 24 25 25 27   --   GLUCOSE 94 72 91 124* 87 85  BUN 62* 56* 54* 48* 42* 40*  CREATININE 2.43* 2.31* 2.11* 2.06* 1.97* 2.00*  CALCIUM 8.9 9.1 9.1 9.0 9.0  --   MG 2.2  --   --   --  1.8  --   PHOS 4.1  --   --   --   --   --    GFR: Estimated Creatinine Clearance: 42.6 mL/min (by C-G formula based on SCr of 2 mg/dL). Liver Function Tests:  Recent Labs Lab 11/28/15 1551 11/29/15 0457  AST 23 19  ALT 13* 11*  ALKPHOS 84 72  BILITOT 1.1 1.7*  PROT 6.4* 5.7*  ALBUMIN 2.9* 2.6*   No results for input(s): LIPASE, AMYLASE in the last 168 hours. No results for input(s): AMMONIA in the last 168 hours. Coagulation Profile: No results for input(s): INR, PROTIME in the last 168 hours. Cardiac Enzymes:  Recent Labs Lab 11/29/15 0457 11/29/15 1057  TROPONINI <0.03 <0.03   BNP (last 3 results) No results for input(s): PROBNP in the  last 8760 hours. HbA1C: No results for input(s): HGBA1C in the last 72 hours. CBG:  Recent Labs Lab 12/02/15 1913 12/02/15 2339 12/03/15 0320 12/03/15 0718 12/03/15 1139  GLUCAP 90 77 72 86 90  Lipid Profile: No results for input(s): CHOL, HDL, LDLCALC, TRIG, CHOLHDL, LDLDIRECT in the last 72 hours. Thyroid Function Tests: No results for input(s): TSH, T4TOTAL, FREET4, T3FREE, THYROIDAB in the last 72 hours. Anemia Panel: No results for input(s): VITAMINB12, FOLATE, FERRITIN, TIBC, IRON, RETICCTPCT in the last 72 hours. Urine analysis:    Component Value Date/Time   COLORURINE YELLOW 06/13/2015 0134   APPEARANCEUR CLOUDY (A) 06/13/2015 0134   LABSPEC 1.017 06/13/2015 0134   PHURINE 5.0 06/13/2015 0134   GLUCOSEU NEGATIVE 06/13/2015 0134   HGBUR NEGATIVE 06/13/2015 0134   BILIRUBINUR NEGATIVE 06/13/2015 0134   KETONESUR NEGATIVE 06/13/2015 0134   PROTEINUR 30 (A) 06/13/2015 0134   NITRITE POSITIVE (A) 06/13/2015 0134   LEUKOCYTESUR SMALL (A) 06/13/2015 0134   Sepsis Labs: @LABRCNTIP (procalcitonin:4,lacticidven:4)    Recent Results (from the past 240 hour(s))  MRSA PCR Screening     Status: None   Collection Time: 11/29/15  6:24 AM  Result Value Ref Range Status   MRSA by PCR NEGATIVE NEGATIVE Final      Radiology Studies: Dg Chest 2 View Result Date: 11/28/2015 CLINICAL DATA: No active cardiopulmonary disease. Electronically Signed   By: Marijo Conception, M.D.   On: 11/28/2015 20:10      Scheduled Meds: . [MAR Hold] sodium chloride   Intravenous Once  . [MAR Hold] acetaminophen  650 mg Oral Once  . [MAR Hold] allopurinol  100 mg Oral Daily  . [MAR Hold] atorvastatin  20 mg Oral Daily  . [MAR Hold] insulin aspart  0-9 Units Subcutaneous Q4H  . [MAR Hold] loratadine  10 mg Oral Daily  . [MAR Hold] metoprolol succinate  25 mg Oral Daily  . [MAR Hold] mometasone-formoterol  2 puff Inhalation BID  . [MAR Hold] pantoprazole  40 mg Oral BID  . [MAR Hold]  potassium chloride  40 mEq Oral BID  . [MAR Hold] sodium chloride flush  10-40 mL Intracatheter Q12H  . [MAR Hold] sodium chloride flush  3 mL Intravenous Q12H   Continuous Infusions: . furosemide (LASIX) infusion 12 mg/hr (12/03/15 1315)     LOS: 5 days    Time spent: 15 minutes  Greater than 50% of the time spent on counseling and coordinating the care.   Leisa Lenz, MD [facility] Pager [phone]  If 7PM-7AM, please contact night-coverage [email or web address] Password [email or web address] 12/03/2015, 2:15 PM

## 2015-12-03 NOTE — Progress Notes (Signed)
Physical Therapy Treatment Patient Details Name: Robyn Porter MRN: [id number] DOB: [birth year] Today's Date: 12/03/2015    History of Present Illness Robyn Porter is a 65 y.o. female with medical history significant of OHS/OSA, chronic systolic CHF and chronic atrial fibrillation on Xarelto. Found to have: worsening anemia and ascending of fluid overload recent cellulitis Hemoccult positive stools in the setting of chronic melena    PT Comments     Pt required guarding  with all aspects of mobility and was extremely limited by fatigue. Continues to require skilled therapy at this time. Vital signs were stable with HR elevated to 88 however DOE noted.  Will continue with current plan of care.    Follow-up recommendation:  SNF     Equipment Recommendations  None recommended by PT    Recommendations for Other Services       Precautions / Restrictions Precautions Precautions: fall Restrictions Weight Bearing Restrictions: No    Mobility  Bed Mobility Overal bed mobility: Needs Assistance Bed Mobility:;Supine to Sit;   Supine to sit: Supervision   General bed mobility comments: HOB up used rail and took increased time   Transfers Overall transfer level: Needs assistance Equipment used: Rolling walker (2 wheeled) Transfers: Sit to/from [facility] Sit to Stand: Min guard;+2 physical assistance  stand pivot: min guard; +2 physical assistance     General transfer comment: sit to stand +2 min guard  for safety  Ambulation/Gait Ambulation/Gait assistance: Min guard;+2 safety/equipment Ambulation Distance (Feet): 50 Feet Assistive device: Rolling walker (2 wheeled) Gait Pattern/deviations: Decreased stride length;Step-to pattern Gait velocity: decreased  Gait velocity interpretation: <1.8 ft/sec, indicative of risk for recurrent falls General Gait Details: pt was easily fatigued with ambulation    Stairs            Wheelchair Mobility     Modified Rankin (Stroke Patients Only)       Balance Overall balance assessment: Needs assistance Sitting-balance support: Feet supported;No upper extremity supported Sitting balance-Leahy Scale: fair Sitting balance - Comments: able to support herself indepedently with bilateral UE support, less stable without UEs    Standing balance support: During functional activity;Bilateral upper extremity supported Standing balance-Leahy Scale: Poor Standing balance comment: Needs support with a least one UE support when up on her feet                    Cognition Arousal/Alertness: Awake/alert Behavior During Therapy: WFL for tasks assessed/performed Overall Cognitive Status: Within Functional Limits for tasks assessed                      Exercises      General Comments General comments (skin integrity, edema, etc.): At EOB assisted pt with posterior bathing , pt perfomed functional tasks in static standing during session       Pertinent Vitals/Pain Pain Assessment: No/denies pain    Home Living                      Prior Function            PT Goals (current goals can now be found in the care plan section) Acute Rehab PT Goals Patient Stated Goal: to be able to go home and go on a vacation she has planned for September PT Goal Formulation: With patient Time For Goal Achievement: 12/13/15 Potential to Achieve Goals: Good Progress towards PT goals: Progressing toward goals    Frequency  Min 3X/week  PT Plan Current plan remains appropriate    Co-evaluation PT/OT/SLP Co-Evaluation/Treatment: Yes Reason for Co-Treatment: For patient/therapist safety PT goals addressed during session: Mobility/safety with mobility;Balance OT goals addressed during session: Strengthening/ROM     End of Session Equipment Utilized During Treatment: Gait belt Activity Tolerance: Patient limited by fatigue Patient left: with nursing/sitter in room;Other  (comment) (pt left on bedside commode with nurse )     Time: [id number] PT Time Calculation (min) (ACUTE ONLY): 31 min  Charges:  $Gait Training: 8-22 mins                    G Codes:      Robyn Porter 12/25/15, 11:58 AM Robyn Porter, SPT

## 2015-12-03 NOTE — Anesthesia Postprocedure Evaluation (Signed)
Anesthesia Post Note  Patient: Robyn Porter  Procedure(s) Performed: Procedure(s) (LRB): COLONOSCOPY (N/A) HOT HEMOSTASIS (ARGON PLASMA COAGULATION/BICAP) (N/A)  Patient location during evaluation: PACU Anesthesia Type: MAC Level of consciousness: awake and alert Pain management: pain level controlled Vital Signs Assessment: post-procedure vital signs reviewed and stable Respiratory status: spontaneous breathing, nonlabored ventilation, respiratory function stable and patient connected to nasal cannula oxygen Cardiovascular status: stable and blood pressure returned to baseline Anesthetic complications: no    Last Vitals:  Vitals:   12/03/15 1420 12/03/15 1440  BP: (!) 143/64 (!) 119/43  Pulse: 64 66  Resp: 16 17  Temp:  36.6 C    Last Pain:  Vitals:   12/03/15 1440  TempSrc: Oral                 Corrisa Gibby DAVID

## 2015-12-03 NOTE — Transfer of Care (Signed)
Immediate Anesthesia Transfer of Care Note  Patient: Robyn Porter  Procedure(s) Performed: Procedure(s): COLONOSCOPY (N/A) HOT HEMOSTASIS (ARGON PLASMA COAGULATION/BICAP) (N/A)  Patient Location: Endoscopy Unit  Anesthesia Type:MAC  Level of Consciousness: awake, alert  and oriented  Airway & Oxygen Therapy: Patient Spontanous Breathing and Patient connected to face mask oxygen  Post-op Assessment: Report given to RN, Post -op Vital signs reviewed and stable and Patient moving all extremities X 4  Post vital signs: Reviewed and stable  Last Vitals:  Vitals:   12/03/15 1238 12/03/15 1354  BP: (!) 148/49 (!) 122/49  Pulse: 66 68  Resp: 12   Temp: 36.9 C     Last Pain:  Vitals:   12/03/15 1238  TempSrc: Oral         Complications: No apparent anesthesia complications

## 2015-12-03 NOTE — Care Management Note (Signed)
Case Management Note  Patient Details  Name: Robyn Porter MRN: [id number] Date of Birth: [birth year]  Subjective/Objective:  Patient with GIB, CHF, capsule study shows blood in distal ileum/ic valve area.  Had colonscopy today, conts on lasix drip and cvp monitoring. Will transition to po lasix tomorrow .  CSW following for snf.                    Action/Plan:   Expected Discharge Date:                  Expected Discharge Plan:  Skilled Nursing Facility  In-House Referral:  Clinical Social Work  Discharge planning Services  CM Consult  Post Acute Care Choice:    Choice offered to:     DME Arranged:    DME Agency:     HH Arranged:    [place] Agency:     Status of Service:  In process, will continue to follow  If discussed at Long Length of Stay Meetings, dates discussed:    Additional Comments:  Zenon Mayo, RN 12/03/2015, 3:38 PM

## 2015-12-04 ENCOUNTER — Encounter (HOSPITAL_COMMUNITY): Payer: Self-pay | Admitting: Internal Medicine

## 2015-12-04 LAB — BASIC METABOLIC PANEL
ANION GAP: 8 (ref 5–15)
BUN: 41 mg/dL — ABNORMAL HIGH (ref 6–20)
CALCIUM: 9.1 mg/dL (ref 8.9–10.3)
CO2: 28 mmol/L (ref 22–32)
Chloride: 102 mmol/L (ref 101–111)
Creatinine, Ser: 2.06 mg/dL — ABNORMAL HIGH (ref 0.44–1.00)
GFR calc non Af Amer: 24 mL/min — ABNORMAL LOW (ref >60)
GFR, EST AFRICAN AMERICAN: 28 mL/min — AB (ref >60)
Glucose, Bld: 97 mg/dL (ref 65–99)
Potassium: 3.7 mmol/L (ref 3.5–5.1)
Sodium: 138 mmol/L (ref 135–145)

## 2015-12-04 LAB — CBC
HCT: 26 % — ABNORMAL LOW (ref 36.0–46.0)
Hemoglobin: 7.8 g/dL — ABNORMAL LOW (ref 12.0–15.0)
MCH: 28.9 pg (ref 26.0–34.0)
MCHC: 30 g/dL (ref 30.0–36.0)
MCV: 96.3 fL (ref 78.0–100.0)
Platelets: 147 10*3/uL — ABNORMAL LOW (ref 150–400)
RBC: 2.7 MIL/uL — ABNORMAL LOW (ref 3.87–5.11)
RDW: 18.4 % — ABNORMAL HIGH (ref 11.5–15.5)
WBC: 4.9 10*3/uL (ref 4.0–10.5)

## 2015-12-04 LAB — GLUCOSE, CAPILLARY
GLUCOSE-CAPILLARY: 88 mg/dL (ref 65–99)
GLUCOSE-CAPILLARY: 99 mg/dL (ref 65–99)
Glucose-Capillary: 108 mg/dL — ABNORMAL HIGH (ref 65–99)
Glucose-Capillary: 110 mg/dL — ABNORMAL HIGH (ref 65–99)

## 2015-12-04 MED ORDER — INSULIN ASPART 100 UNIT/ML ~~LOC~~ SOLN
0.0000 [IU] | Freq: Three times a day (TID) | SUBCUTANEOUS | Status: DC
  Administered 2015-12-06 – 2015-12-09 (×2): 1 [IU] via SUBCUTANEOUS

## 2015-12-04 MED ORDER — RIVAROXABAN 15 MG PO TABS: 15.0000 mg | ORAL_TABLET | Freq: Every day | ORAL | Status: DC

## 2015-12-04 NOTE — Care Management Note (Signed)
Case Management Note  Patient Details  Name: Ashiyah Dennington MRN: [id number] Date of Birth: November 16, 1950  Subjective/Objective:   NCM and CSW spoke with patient, she has decided that she does not want to go to snf, she wants to go home with [facility], [place], [place], she states she has had [facility] HH services in the past and would like them again in [place].  Patient states she has  A rollator, w/chair, scooter, rolling walker, and her shower is equipped with bars and a place for her to sit.  Will need order for HHRN, HHPT, [place], NCM will cont  To follow.                  Action/Plan:   Expected Discharge Date:                  Expected Discharge Plan:  Skilled Nursing Facility  In-House Referral:  Clinical Social Work  Discharge planning Services  CM Consult  Post Acute Care Choice:    Choice offered to:     DME Arranged:    DME Agency:     HH Arranged:    [place] Agency:     Status of Service:  In process, will continue to follow  If discussed at Long Length of Stay Meetings, dates discussed:    Additional Comments:  Zenon Mayo, RN 12/04/2015, 4:08 PM

## 2015-12-04 NOTE — Progress Notes (Signed)
Pt on Xarelto for Afib being held due to GI bleed. Per GI restart Xarelto in 7 days post colonoscopy 8/7. Clarification made with Abrol MD regarding restarting Xarelto will hold off for now and follow GI recommendations.

## 2015-12-04 NOTE — Progress Notes (Signed)
Advanced Heart Failure Rounding Note  Primary Cardiologist: Aundra Dubin Reason for Consultation: CHF  Subjective:    Admitted 11/28/15 with anemia.  Hgb 7.8 this am. Colonoscopy 12/03/15 with several lesions that were treated with APC.  Remains swollen and still SOB with exertion.   Out 1.4 L. Bed scale broken. Awaiting standing weight this am. On lasix 12 mg/hr. K 3.7  Echo (11/29/15) with EF 50-55%, mildly dilated RV, moderate AS with mild to moderate AI.   Objective:   Weight Range: (!) 357 lb (161.9 kg) Body mass index is 61.28 kg/m.   Vital Signs:   Temp:  [97.5 F (36.4 C)-98.7 F (37.1 C)] 98 F (36.7 C) (08/08 0810) Pulse Rate:  [63-68] 66 (08/08 0414) Resp:  [12-20] 15 (08/08 0810) BP: (107-148)/(43-89) 132/66 (08/08 0810) SpO2:  [94 %-100 %] 97 % (08/08 0810) Weight:  [357 lb (161.9 kg)] 357 lb (161.9 kg) (08/07 1238) Last BM Date: 12/03/15  Weight change: Filed Weights   12/02/15 0500 12/03/15 0800 12/03/15 1238  Weight: (!) 360 lb (163.3 kg) (!) 357 lb 8 oz (162.2 kg) (!) 357 lb (161.9 kg)    Intake/Output:   Intake/Output Summary (Last 24 hours) at 12/04/15 0837 Last data filed at 12/04/15 0600  Gross per 24 hour  Intake             1144 ml  Output             2080 ml  Net             -936 ml     Physical Exam: CVP not registering.  General: NAD Neck: Thick, JVP difficult but elevated, no thyromegaly or thyroid nodule.  Lungs: Diminished basilar sounds.  CV: Nondisplaced PMI.  Heart regular S1/S2, no [id number], 3/6 systolic crescendo-decrescendo murmur with obscured S2.  - 1-2+ edema to knees.  No carotid bruit.  Normal pedal pulses.  Abdomen: Morbidly obese, soft, NT, ND, no HSM. No bruits or masses. +BS  Skin: Intact without lesions or rashes.  Neurologic: Alert and oriented x 3.  Psych: Normal affect. Extremities: No clubbing or cyanosis.  HEENT: Normal.   Telemetry: Reviewed, Afib 60s  Labs: CBC  Recent Labs  12/03/15 0415 12/03/15 1308  12/04/15 0430  WBC 4.0  --  4.9  HGB 7.4* 9.5* 7.8*  HCT 24.8* 28.0* 26.0*  MCV 96.9  --  96.3  PLT 142*  --  [id number]*   Basic Metabolic Panel  Recent Labs  12/03/15 0415 12/03/15 1308 12/04/15 0430  NA 137 140 138  K 3.4* 3.6 3.7  CL 101 102 102  CO2 27  --  28  GLUCOSE 87 85 97  BUN 42* 40* 41*  CREATININE 1.97* 2.00* 2.06*  CALCIUM 9.0  --  9.1  MG 1.8  --   --    Liver Function Tests No results for input(s): AST, ALT, ALKPHOS, BILITOT, PROT, ALBUMIN in the last 72 hours. No results for input(s): LIPASE, AMYLASE in the last 72 hours. Cardiac Enzymes No results for input(s): CKTOTAL, CKMB, CKMBINDEX, TROPONINI in the last 72 hours.  BNP: BNP (last 3 results)  Recent Labs  06/12/15 2328 07/09/15 1158 07/19/15 1124  BNP 940.9* 133.0* 149.0*    ProBNP (last 3 results) No results for input(s): PROBNP in the last 8760 hours.   D-Dimer No results for input(s): DDIMER in the last 72 hours. Hemoglobin A1C No results for input(s): HGBA1C in the last 72 hours. Fasting Lipid Panel No  results for input(s): CHOL, HDL, LDLCALC, TRIG, CHOLHDL, LDLDIRECT in the last 72 hours. Thyroid Function Tests No results for input(s): TSH, T4TOTAL, T3FREE, THYROIDAB in the last 72 hours.  Invalid input(s): FREET3  Other results:     Imaging/Studies:  No results found.  Latest Echo  Latest Cath   Medications:     Scheduled Medications: . sodium chloride   Intravenous Once  . acetaminophen  650 mg Oral Once  . allopurinol  100 mg Oral Daily  . atorvastatin  20 mg Oral Daily  . insulin aspart  0-9 Units Subcutaneous TID WC  . loratadine  10 mg Oral Daily  . metoprolol succinate  25 mg Oral Daily  . mometasone-formoterol  2 puff Inhalation BID  . pantoprazole  40 mg Oral BID  . potassium chloride  40 mEq Oral BID  . sodium chloride flush  10-40 mL Intracatheter Q12H  . sodium chloride flush  3 mL Intravenous Q12H    Infusions: . furosemide (LASIX) infusion 12  mg/hr (12/04/15 0600)    PRN Medications: acetaminophen **OR** acetaminophen, HYDROcodone-acetaminophen, ipratropium-albuterol, ondansetron **OR** ondansetron (ZOFRAN) IV, sodium chloride flush   Assessment/Plan   65 yo with morbid obesity, chronic primarily diastolic CHF (EF [id number]), CKD stage III was admitted with anemia and suspected GI bleeding, also with suspected CHF exacerbation given significant weight gain.   1. GI bleeding/anemia: Heme+.  Has dark stools but also on iron.  Has had periodic hematochezia that she attributes to hemorrhoids.  Has had 2 units PRBCs so far.  EGD on 8/3 with no source of bleeding.  - Capsule endo with blood in very distal ileum/IC valve area.   - Colonoscopy 12/03/15 with several lesions treated with APC.   - Xarelto on hold. On PPI.  2. Acute on chronic primarily diastolic CHF: EF [id number] with mild RV dilation on echo 11/29/15.  PICC Line placed.  - CVP not reading. Will have PICC team come look at.  May have clot in on of the connections.  - She remains volume overloaded on exam.  Will continue lasix drip at 12 mg/hr for today. Will likely transition to po in am.     - Supp K.  - Continue Toprol XL 25 mg daily. - CRT device interrogated, she is BiV pacing appropriately (94% BiV pacing) and EF is higher than prior.  - Need daily weights and strict I/Os.  3. AKI on CKD Stage III: - BUN/Creatinine stable. Continue to follow closely with diuresis.  - Marked anemia ( initial Hgb in 6 range at PCP) likely contributed to AKI as well. - She has renal appointment scheduled for 12/12/15 with [facility] - Dr. Hollie Salk - If run into difficulty diuresis or rise in creatinine, will consider renal consult in house.  4. Murmur: Moderate AS with mild/mod AI  on Echo 11/29/15.   5. Bradycardia: MDT CRT-P device functioning appropriately.  6. Atrial fibrillation: Chronic - off Xarelto for the time being with GI bleeding work up ongoing. - Rate controlled.  Length of  Stay: 6  Robyn Friar PA-C 12/04/2015, 8:37 AM  Advanced Heart Failure Team Pager [phone] (M-F; 7a - 4p)  Please contact [facility] for night-coverage after hours (4p -7a ) and weekends on [email or web address]  Patient seen and examined with Robyn Kilts, PA-C. We discussed all aspects of the encounter. I agree with the assessment and plan as stated above.   Volume status improving. Will continue IV lasix one more day and switch to po  tomorrow. Decision to transfuse per primary team. Supp K. Would recommend restarting Xarelto. And following closely.  Robyn Goodroe,MD 8:45 AM

## 2015-12-04 NOTE — Clinical Social Work Note (Signed)
Patient prefers to go home rather than SNF. RNCM to set up HHPT through [facility] in [place].  CSW signing off. Consult again if any social work needs arise.  Dayton Scrape, [place]

## 2015-12-04 NOTE — Progress Notes (Signed)
CARDIAC REHAB PHASE I   PRE:  Rate/Rhythm: 75 paced  BP:  Sitting: 100/61        SaO2: 97 RA  MODE:  Ambulation: 72 ft   POST:  Rate/Rhythm: 65 paced  BP:  Sitting: 109/53         SaO2: 90 RA  Pt up in bariatric recliner, assisted to bedside commode, assisted in personal hygiene. Pt stood with minimal assistance. Pt easily fatigued, states she is only able to stand for a brief period of time, "less than two minutes" without her "knees giving out." Pt ambulated 72 ft on RA, IV, foley catheter, assist x2 (followed with wheelchair), slow, fairly steady gait, tolerated fair. Pt very short of breath, sats 90% on RA, c/o feeling "woozy" with increased distance, seated rest x2. Did increase distance today, very motivated. Pt very appreciative of walk. Pt to bed x2 assist after walk, call bell within reach. Will follow as x2 as schedule permits.    [place], RN, BSN 12/04/2015 2:20 PM

## 2015-12-04 NOTE — Progress Notes (Signed)
Patient ID: Robyn Porter, female   DOB: Aug 07, 1950, 65 y.o.   MRN: [id number]  PROGRESS NOTE    Robyn Porter  [id number] DOB: 25-Jun-1950 DOA: 11/28/2015  PCP: Michell Heinrich, DO   Brief Narrative:  65 yo with history of OHS/OSA, chronic systolic and diastolic CHF (last 2 D ECHO in 05/2015 showed EF 45%), atrial fibrillation on xarelto. She was admitted in 2/17 to [facility] with massive volume overload. She was aggressively diuresed in the hospital and lost around 70 lbs. She had BRBPR in the hospital, this was thought to be hemorrhoidal bleeding (had colonoscopy). She was subsequently discharged to SNF. She was admitted 09/19/15 - 09/21/15 for upgrade to CRT-P . She was noted to be markedly volume overloaded so HF team took over for diuresis. Overall diuresed 5.5 L, though weights were thought to be innacurate. Discharge weight was 363 lbs. Her weight has gone up about 10 lbs after decreasing her diuretics for worsening creatinine.    She was admitted for low hemoglobin. On admission, hemoglobin was 7.1. She has been transfused 2 U PRBC so far. GI has seen her in consultation. EGD with no acute pathology. Capsule endo results are pending and plan is for colonoscopy today.  Cardiology is following for volume overload.   Assessment & Plan:   Active Problems:   Acute blood loss anemia / Acute GI bleed / Anemia of chronic kidney disease - Hemoglobin 7.1 on admission. She also has chronic anemia due to CKD, now 7.6  - FOBT was positive on this admission and she had dark stools (noted pt also on iron) - Xarelto stopped due to risk of bleed ,  Cards recommends to restart it  - Continue protonix  - EGD done 8/3 but no pathology noted - Capsule endoscopy showed some blood in very distal ileum /IC valve area   Colonoscopy 8/ 7 showed  few recently bleeding colonic angiodysplastic lesions. Treated with argon plasma coagulation (APC).    CKD stage 4 - Baseline Cr 2.51 about 1 month ago, now  around 2.0 -      Dyslipidemia associated with type 2 DM - Continue atorvastatin 20 mg daily     OSA (obstructive sleep apnea) / Pulmonary hypertension - CPAP at bedtime    Acute on chronic systolic and diastolic CHF  / Hypokalemia  - Last 2 D ECHO in 05/2015 with EF 40-45%, moderate pulmonary hypertension  - Appreciate cardio following - Pt on lasix drip  - CRT device interrogated and pacing appropriately - Continue daily weight and strict intake and output  CRT device interrogated, she is BiV pacing appropriately (94% BiV pacing) and EF is higher than prior.  - Need daily weights and strict I/Os    Chronic atrial fibrillation - CHADS vasc score 5 (HTN, 65, gender, CHF, DM) - Off of xarelto due to GI bleed - Rate controlled with metoprolol 25 mg daily     Essential hypertension - Continue metoprolol 25 mg daily     Morbid obesity due to excess calories with comorbidities (HTN, DM, Dyslipidemia) - Body mass index is 60.68 kg/m. 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 Sitting in chair having lunch  Objective: Vitals:   12/04/15 0810 12/04/15 0851 12/04/15 1010 12/04/15 1212  BP: 132/66  (!) 120/49 (!) 118/46  Pulse:    (!) 58  Resp: 15  17 17   Temp: 98 F (36.7 C)   97.7 F (36.5 C)  TempSrc: Oral   Oral  SpO2: 97%   98%  Weight:  (!) 160.3 kg (353 lb 8 oz)    Height:        Intake/Output  Summary (Last 24 hours) at 12/04/15 1355 Last data filed at 12/04/15 1342  Gross per 24 hour  Intake             1896 ml  Output             2325 ml  Net             -429 ml   Filed Weights   12/03/15 0800 12/03/15 1238 12/04/15 0851  Weight: (!) 162.2 kg (357 lb 8 oz) (!) 161.9 kg (357 lb) (!) 160.3 kg (353 lb 8 oz)    Examination:  General exam: no acute distress  Respiratory system: No wheezing, no rhonchi  Cardiovascular system: S1 & S2 heard, rate controlled  Gastrointestinal system: Obese, non tender, (+) BS Central nervous system: Nonfocal  Extremities: obese, palpable pulses, LE edema (+) Skin: no ulcers or lesions  Psychiatry: Normal mood and behavior     Data Reviewed: I have personally reviewed following labs and imaging studies  CBC:  Recent Labs Lab 11/29/15 0457 11/30/15 0507 12/01/15 0500 12/03/15 0415 12/03/15 1308 12/04/15 0430  WBC 7.0 4.3 4.7 4.0  --  4.9  HGB 7.5* 8.0* 7.8* 7.4* 9.5* 7.8*  HCT 24.3* 27.2* 25.5* 24.8* 28.0* 26.0*  MCV 95.7 97.8 96.6 96.9  --  96.3  PLT 201 197 170 142*  --  [id number]*   Basic Metabolic Panel:  Recent Labs Lab 11/29/15 0457 11/30/15 0507 12/01/15 0500 12/02/15 0430 12/03/15 0415 12/03/15 1308 12/04/15 0430  NA 138 138 138 138 137 140 138  K 3.9 3.6 3.5 3.3* 3.4* 3.6 3.7  CL 109 104 103 103 101 102 102  CO2 23 24 25 25 27   --  28  GLUCOSE 94 72 91 124* 87 85 97  BUN 62* 56* 54* 48* 42* 40* 41*  CREATININE 2.43* 2.31* 2.11* 2.06* 1.97* 2.00* 2.06*  CALCIUM 8.9 9.1 9.1 9.0 9.0  --  9.1  MG 2.2  --   --   --  1.8  --   --   PHOS 4.1  --   --   --   --   --   --    GFR: Estimated Creatinine Clearance: 41.1 mL/min (by C-G formula based on SCr of 2.06 mg/dL). Liver Function Tests:  Recent Labs Lab 11/28/15 1551 11/29/15 0457  AST 23 19  ALT 13* 11*  ALKPHOS 84 72  BILITOT 1.1 1.7*  PROT 6.4* 5.7*  ALBUMIN 2.9* 2.6*   No results for input(s): LIPASE, AMYLASE in the last 168 hours. No results for  input(s): AMMONIA in the last 168 hours. Coagulation Profile: No results for input(s): INR, PROTIME in the last 168 hours. Cardiac Enzymes:  Recent Labs Lab  11/29/15 0457 11/29/15 1057  TROPONINI <0.03 <0.03   BNP (last 3 results) No results for input(s): PROBNP in the last 8760 hours. HbA1C: No results for input(s): HGBA1C in the last 72 hours. CBG:  Recent Labs Lab 12/03/15 1139 12/03/15 1549 12/03/15 1922 12/04/15 0807 12/04/15 1210  GLUCAP 90 96 146* 88 108*   Lipid Profile: No results for input(s): CHOL, HDL, LDLCALC, TRIG, CHOLHDL, LDLDIRECT in the last 72 hours. Thyroid Function Tests: No results for input(s): TSH, T4TOTAL, FREET4, T3FREE, THYROIDAB in the last 72 hours. Anemia Panel: No results for input(s): VITAMINB12, FOLATE, FERRITIN, TIBC, IRON, RETICCTPCT in the last 72 hours. Urine analysis:    Component Value Date/Time   COLORURINE YELLOW 06/13/2015 0134   APPEARANCEUR CLOUDY (A) 06/13/2015 0134   LABSPEC 1.017 06/13/2015 0134   PHURINE 5.0 06/13/2015 0134   GLUCOSEU NEGATIVE 06/13/2015 0134   HGBUR NEGATIVE 06/13/2015 0134   BILIRUBINUR NEGATIVE 06/13/2015 0134   KETONESUR NEGATIVE 06/13/2015 0134   PROTEINUR 30 (A) 06/13/2015 0134   NITRITE POSITIVE (A) 06/13/2015 0134   LEUKOCYTESUR SMALL (A) 06/13/2015 0134   Sepsis Labs: @LABRCNTIP (procalcitonin:4,lacticidven:4)    Recent Results (from the past 240 hour(s))  MRSA PCR Screening     Status: None   Collection Time: 11/29/15  6:24 AM  Result Value Ref Range Status   MRSA by PCR NEGATIVE NEGATIVE Final      Radiology Studies: Dg Chest 2 View Result Date: 11/28/2015 CLINICAL DATA: No active cardiopulmonary disease. Electronically Signed   By: Marijo Conception, M.D.   On: 11/28/2015 20:10      Scheduled Meds: . sodium chloride   Intravenous Once  . acetaminophen  650 mg Oral Once  . allopurinol  100 mg Oral Daily  . atorvastatin  20 mg Oral Daily  . insulin aspart  0-9 Units  Subcutaneous TID WC  . loratadine  10 mg Oral Daily  . metoprolol succinate  25 mg Oral Daily  . mometasone-formoterol  2 puff Inhalation BID  . pantoprazole  40 mg Oral BID  . potassium chloride  40 mEq Oral BID  . sodium chloride flush  10-40 mL Intracatheter Q12H  . sodium chloride flush  3 mL Intravenous Q12H   Continuous Infusions: . furosemide (LASIX) infusion 12 mg/hr (12/04/15 0800)     LOS: 6 days    Time spent: 15 minutes  Greater than 50% of the time spent on counseling and coordinating the care.   Reyne Dumas, MD [facility] Pager [phone]  If 7PM-7AM, please contact night-coverage [email or web address] Password [email or web address] 12/04/2015, 1:55 PM

## 2015-12-04 NOTE — Progress Notes (Signed)
ANTICOAGULATION CONSULT NOTE - Initial Consult  Pharmacy Consult for xarelto Indication: atrial fibrillation  Allergies  Allergen Reactions  . Adhesive [Tape] Other (See Comments)    Tears skin.  Please use "paper" tape  . Amoxicillin Hives  . Contrast Media [Iodinated Diagnostic Agents] Hives  . Shellfish-Derived Products Hives    Can eat crab cakes  . Benadryl [Diphenhydramine Hcl] Rash    Patient Measurements: Height: 5\' 4"  (162.6 cm) Weight: (!) 353 lb 8 oz (160.3 kg) IBW/kg (Calculated) : 54.7  Vital Signs: Temp: 97.7 F (36.5 C) (08/08 1212) Temp Source: Oral (08/08 1212) BP: 118/46 (08/08 1212) Pulse Rate: 58 (08/08 1212)  Labs:  Recent Labs  12/03/15 0415 12/03/15 1308 12/04/15 0430  HGB 7.4* 9.5* 7.8*  HCT 24.8* 28.0* 26.0*  PLT 142*  --  147*  CREATININE 1.97* 2.00* 2.06*    Estimated Creatinine Clearance: 41.1 mL/min (by C-G formula based on SCr of 2.06 mg/dL).   Medical History: Past Medical History:  Diagnosis Date  . Afib ([place])   . CHF (congestive heart failure) ([place])   . COPD (chronic obstructive pulmonary disease) ([place])   . Diabetes mellitus without complication ([place])   . Hypertension   . Pacemaker   . Shortness of breath dyspnea     Assessment: Robyn Porter on xarelto prior to admission for afib.   Colonoscopy 8/7 showed a few recently bleeding colonic angiodysplastic lesions treated with APC. Recommended by cardiology to resume anticoagulation which was done today. Hgb 7.6 currently, will follow closely. Given reduced renal function will continue with reduced dose of 15mg  daily.  Goal of Therapy:  Monitor platelets by anticoagulation protocol: Yes   Plan:  Xarelto 15mg  daily Follow cbc and s/s of bleeding closely  Erin Hearing PharmD., BCPS Clinical Pharmacist Pager [phone] 12/04/2015 2:36 PM

## 2015-12-04 NOTE — Progress Notes (Signed)
Received verbal order from Shirley Friar, PA-C and Einar Crow to insert urinary catheter for aggressive IV diuresis.

## 2015-12-05 LAB — GLUCOSE, CAPILLARY
GLUCOSE-CAPILLARY: 100 mg/dL — AB (ref 65–99)
GLUCOSE-CAPILLARY: 98 mg/dL (ref 65–99)
GLUCOSE-CAPILLARY: 98 mg/dL (ref 65–99)
Glucose-Capillary: 79 mg/dL (ref 65–99)

## 2015-12-05 LAB — CBC
HEMATOCRIT: 26.9 % — AB (ref 36.0–46.0)
HEMOGLOBIN: 7.9 g/dL — AB (ref 12.0–15.0)
MCH: 28.7 pg (ref 26.0–34.0)
MCHC: 29.4 g/dL — ABNORMAL LOW (ref 30.0–36.0)
MCV: 97.8 fL (ref 78.0–100.0)
Platelets: 129 10*3/uL — ABNORMAL LOW (ref 150–400)
RBC: 2.75 MIL/uL — AB (ref 3.87–5.11)
RDW: 18.1 % — ABNORMAL HIGH (ref 11.5–15.5)
WBC: 4.5 10*3/uL (ref 4.0–10.5)

## 2015-12-05 LAB — COMPREHENSIVE METABOLIC PANEL
ALBUMIN: 2.7 g/dL — AB (ref 3.5–5.0)
ALK PHOS: 70 U/L (ref 38–126)
ALT: 12 U/L — ABNORMAL LOW (ref 14–54)
AST: 24 U/L (ref 15–41)
Anion gap: 10 (ref 5–15)
BILIRUBIN TOTAL: 1.5 mg/dL — AB (ref 0.3–1.2)
BUN: 40 mg/dL — AB (ref 6–20)
CALCIUM: 9.1 mg/dL (ref 8.9–10.3)
CO2: 28 mmol/L (ref 22–32)
Chloride: 101 mmol/L (ref 101–111)
Creatinine, Ser: 2.09 mg/dL — ABNORMAL HIGH (ref 0.44–1.00)
GFR calc Af Amer: 27 mL/min — ABNORMAL LOW (ref >60)
GFR calc non Af Amer: 24 mL/min — ABNORMAL LOW (ref >60)
GLUCOSE: 87 mg/dL (ref 65–99)
Potassium: 3.6 mmol/L (ref 3.5–5.1)
Sodium: 139 mmol/L (ref 135–145)
TOTAL PROTEIN: 6.1 g/dL — AB (ref 6.5–8.1)

## 2015-12-05 MED ORDER — METOLAZONE 2.5 MG PO TABS
5.0000 mg | ORAL_TABLET | Freq: Once | ORAL | Status: AC
  Administered 2015-12-05: 5 mg via ORAL
  Filled 2015-12-05: qty 2

## 2015-12-05 MED ORDER — POTASSIUM CHLORIDE CRYS ER 20 MEQ PO TBCR
40.0000 meq | EXTENDED_RELEASE_TABLET | Freq: Once | ORAL | Status: AC
  Administered 2015-12-05: 40 meq via ORAL
  Filled 2015-12-05: qty 2

## 2015-12-05 NOTE — Progress Notes (Signed)
Advanced Heart Failure Rounding Note  Primary Cardiologist: Aundra Dubin Reason for Consultation: CHF  Subjective:    Admitted 11/28/15 with anemia.  Hgb 7.8 this am. Colonoscopy 12/03/15 with several lesions that were treated with APC.  Feeling OK today. Still having DOE with minimal exertion. Remains swollen into thighs.   Out 800 cc.  Weight inaccurate (scale -> bed weight)   Creatinine stable on lasix 12 mg/hr. K 3.6  Echo (11/29/15) with EF 50-55%, mildly dilated RV, moderate AS with mild to moderate AI.   Objective:   Weight Range: (!) 370 lb (167.8 kg) Body mass index is 63.51 kg/m.   Vital Signs:   Temp:  [97.3 F (36.3 C)-98.1 F (36.7 C)] 97.7 F (36.5 C) (08/09 0806) Pulse Rate:  [58-69] 69 (08/09 0806) Resp:  [13-18] 18 (08/09 0806) BP: (110-127)/(46-70) 122/60 (08/09 0806) SpO2:  [95 %-100 %] 95 % (08/09 0806) Weight:  [353 lb 8 oz (160.3 kg)-370 lb (167.8 kg)] 370 lb (167.8 kg) (08/09 0806) Last BM Date: 12/04/15  Weight change: Filed Weights   12/03/15 1238 12/04/15 0851 12/05/15 0806  Weight: (!) 357 lb (161.9 kg) (!) 353 lb 8 oz (160.3 kg) (!) 370 lb (167.8 kg)    Intake/Output:   Intake/Output Summary (Last 24 hours) at 12/05/15 0834 Last data filed at 12/05/15 0811  Gross per 24 hour  Intake             1464 ml  Output             2300 ml  Net             -836 ml     Physical Exam: CVP 18-19 General: NAD Neck: Thick, JVP to jaw.  no thyromegaly or thyroid nodule.  Lungs: Diminished throughout CV: Nondisplaced PMI.  Heart regular S1/S2, no [id number], 3/6 systolic crescendo-decrescendo murmur with obscured S2. 1-2+ edema into thighs.  No carotid bruit.  Normal pedal pulses.  Abdomen: Morbidly obese, soft, NT, ND, no HSM. No bruits or masses. +BS  Skin: Intact without lesions or rashes.  Neurologic: Alert and oriented x 3.  Psych: Normal affect. Extremities: No clubbing or cyanosis.  HEENT: Normal.   Telemetry: Reviewed personally, Afib  60s  Labs: CBC  Recent Labs  12/04/15 0430 12/05/15 0520  WBC 4.9 4.5  HGB 7.8* 7.9*  HCT 26.0* 26.9*  MCV 96.3 97.8  PLT 147* [id number]*   Basic Metabolic Panel  Recent Labs  12/03/15 0415  12/04/15 0430 12/05/15 0520  NA 137  < > 138 139  K 3.4*  < > 3.7 3.6  CL 101  < > 102 101  CO2 27  --  28 28  GLUCOSE 87  < > 97 87  BUN 42*  < > 41* 40*  CREATININE 1.97*  < > 2.06* 2.09*  CALCIUM 9.0  --  9.1 9.1  MG 1.8  --   --   --   < > = values in this interval not displayed. Liver Function Tests  Recent Labs  12/05/15 0520  AST 24  ALT 12*  ALKPHOS 70  BILITOT 1.5*  PROT 6.1*  ALBUMIN 2.7*   No results for input(s): LIPASE, AMYLASE in the last 72 hours. Cardiac Enzymes No results for input(s): CKTOTAL, CKMB, CKMBINDEX, TROPONINI in the last 72 hours.  BNP: BNP (last 3 results)  Recent Labs  06/12/15 2328 07/09/15 1158 07/19/15 1124  BNP 940.9* 133.0* 149.0*    ProBNP (last 3 results)  No results for input(s): PROBNP in the last 8760 hours.   D-Dimer No results for input(s): DDIMER in the last 72 hours. Hemoglobin A1C No results for input(s): HGBA1C in the last 72 hours. Fasting Lipid Panel No results for input(s): CHOL, HDL, LDLCALC, TRIG, CHOLHDL, LDLDIRECT in the last 72 hours. Thyroid Function Tests No results for input(s): TSH, T4TOTAL, T3FREE, THYROIDAB in the last 72 hours.  Invalid input(s): FREET3  Other results:     Imaging/Studies:  No results found.  Latest Echo  Latest Cath   Medications:     Scheduled Medications: . sodium chloride   Intravenous Once  . acetaminophen  650 mg Oral Once  . allopurinol  100 mg Oral Daily  . atorvastatin  20 mg Oral Daily  . insulin aspart  0-9 Units Subcutaneous TID WC  . loratadine  10 mg Oral Daily  . metoprolol succinate  25 mg Oral Daily  . mometasone-formoterol  2 puff Inhalation BID  . pantoprazole  40 mg Oral BID  . potassium chloride  40 mEq Oral BID  . sodium chloride flush   10-40 mL Intracatheter Q12H  . sodium chloride flush  3 mL Intravenous Q12H    Infusions: . furosemide (LASIX) infusion 12 mg/hr (12/05/15 0600)    PRN Medications: acetaminophen **OR** acetaminophen, HYDROcodone-acetaminophen, ipratropium-albuterol, ondansetron **OR** ondansetron (ZOFRAN) IV, sodium chloride flush   Assessment/Plan   65 yo with morbid obesity, chronic primarily diastolic CHF (EF [id number]), CKD stage III was admitted with anemia and suspected GI bleeding, also with suspected CHF exacerbation given significant weight gain.   1. GI bleeding/anemia: Heme+.  Has dark stools but also on iron.  Has had periodic hematochezia that she attributes to hemorrhoids.  Has had 2 units PRBCs so far.  EGD on 8/3 with no source of bleeding.  - Capsule endo with blood in very distal ileum/IC valve area.   - Colonoscopy 12/03/15 with several lesions treated with APC.   - Xarelto on hold. On PPI.  To resume Xarelto 12/10/15 per GI.  2. Acute on chronic primarily diastolic CHF: EF [id number] with mild RV dilation on echo 11/29/15.  PICC Line placed.  - CVP remains elevated at 19-20. Continue lasix drip at 12 mg/hr and give dose of metolazone 5 mg today. Increase K supp.  - Continue Toprol XL 25 mg daily. - CRT device interrogated, she is BiV pacing appropriately (94% BiV pacing) and EF is higher than prior.  - Need daily weights and strict I/Os.  3. AKI on CKD Stage III: - BUN/Creatinine stable. Continue to follow closely with diuresis.  - Marked anemia ( initial Hgb in 6 range at PCP) likely contributed to AKI as well. - She has renal appointment scheduled for 12/12/15 with [facility] - Dr. Hollie Salk 4. Murmur: Moderate AS with mild/mod AI  on Echo 11/29/15.   5. Bradycardia: MDT CRT-P device functioning appropriately.  6. Atrial fibrillation: Chronic -off Xarelto due to GI bleeding. . - Rate controlled. -GI recommends holding Xarelto until 12/10/15.  Metolazone today. CVP 19-20. Needs standing  weight.   Length of Stay: 7  Shirley Friar PA-C 12/05/2015, 8:34 AM  Advanced Heart Failure Team Pager [phone] (M-F; 7a - 4p)  Please contact [facility] for night-coverage after hours (4p -7a ) and weekends on [email or web address]   Patient seen and examined with Oda Kilts, PA-C. We discussed all aspects of the encounter. I agree with the assessment and plan as stated above.   She continues to improve  but CVP still very high. Needs further IV diuresis. Agree with lasix and metolazone today. Continue mobilize. Renal function is stable. AF rate controlled. GI recommends holding Xarelto until 8.14  Esvin Hnat,MD 8:28 PM

## 2015-12-05 NOTE — Progress Notes (Addendum)
Patient ID: Robyn Porter, female   DOB: Oct 05, 1950, 65 y.o.   MRN: [id number]  PROGRESS NOTE    Marija Murner  [id number] DOB: 03-Mar-1951 DOA: 11/28/2015  PCP: Michell Heinrich, DO   Brief Narrative:  65 yo with history of OHS/OSA, chronic systolic and diastolic CHF (last 2 D ECHO in 05/2015 showed EF 45%), atrial fibrillation on xarelto. She was admitted in 2/17 to [facility] with massive volume overload. She was aggressively diuresed in the hospital and lost around 70 lbs. She had BRBPR in the hospital, this was thought to be hemorrhoidal bleeding (had colonoscopy). She was subsequently discharged to SNF. She was admitted 09/19/15 - 09/21/15 for upgrade to CRT-P . She was noted to be markedly volume overloaded so HF team took over for diuresis. Overall diuresed 5.5 L, though weights were thought to be innacurate. Discharge weight was 363 lbs. Her weight has gone up about 10 lbs after decreasing her diuretics for worsening creatinine.    She was admitted for low hemoglobin. On admission, hemoglobin was 7.1. She has been transfused 2 U PRBC so far. GI has seen her in consultation. EGD with no acute pathology. Capsule endo results are pending and plan is for colonoscopy today.  Cardiology is following for volume overload.   Assessment & Plan:   Active Problems:   Acute blood loss anemia / Acute GI bleed / Anemia of chronic kidney disease - Hemoglobin 7.1 on admission. She also has chronic anemia due to CKD,   - FOBT was positive on this admission and she had dark stools (noted pt also on iron) - Xarelto stopped due to risk of bleed ,  Cards recommends to restart it  - Continue protonix  - EGD done 8/3 but no pathology noted,  - Capsule endoscopy showed some blood in very distal ileum /IC valve area   Colonoscopy 8/ 7 showed  few recently bleeding colonic angiodysplastic lesions. Treated with argon plasma coagulation (APC). Hgb 7.8 this am    CKD stage 4 - Baseline Cr 2.51 about 1  month ago, now around 2.0, stable for several days on lasix gtt -      Dyslipidemia associated with type 2 DM - Continue atorvastatin 20 mg daily     OSA (obstructive sleep apnea) / Pulmonary hypertension - CPAP at bedtime    Acute on chronic systolic and diastolic CHF  / Hypokalemia  - Last 2 D ECHO in 05/2015 with EF 40-45%, moderate pulmonary hypertension  - Appreciate cardio following - Continue lasix drip at 12 mg/hr and give dose of metolazone 5 mg today - CRT device interrogated and pacing appropriately - Continue daily weight and strict intake and output  CRT device interrogated, she is BiV pacing appropriately (94% BiV pacing) and EF is higher than prior.  - Need daily weights and strict I/Os    Chronic atrial fibrillation - CHADS vasc score 5 (HTN, age, gender, CHF, DM) - Off of xarelto due to GI bleed,holding Xarelto until 12/10/15 - Rate controlled with metoprolol 25 mg daily     Essential hypertension - Continue metoprolol 25 mg daily     Morbid obesity due to excess calories with comorbidities (HTN, DM, Dyslipidemia) - Body mass index is 60.94 kg/m. 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 Sitting in chair having lunch  Objective: Vitals:   12/05/15 0428 12/05/15 0806 12/05/15 1000 12/05/15 1120  BP: (!) 127/55 122/60  (!) 106/94  Pulse: 62 69   64  Resp: 14 18  16   Temp: 98.1 F (36.7 C) 97.7 F (36.5 C)  97.9 F (36.6 C)  TempSrc: Oral Oral  Oral  SpO2: 98% 95%  99%  Weight:  (!) 167.8 kg (370 lb) (!) 161 kg (355 lb)   Height:        Intake/Output Summary (Last 24 hours) at 12/05/15 1409 Last data filed at 12/05/15 1123  Gross per 24 hour  Intake              576 ml  Output             2275 ml  Net            -1699 ml   Filed Weights   12/04/15 0851 12/05/15 0806 12/05/15 1000  Weight: (!) 160.3 kg (353 lb 8 oz) (!) 167.8 kg (370 lb) (!) 161 kg (355 lb)    Examination:  General exam: no acute distress  Respiratory system: No wheezing, no rhonchi  Cardiovascular system: S1 & S2 heard, rate controlled  Gastrointestinal system: Obese, non tender, (+) BS Central nervous system: Nonfocal  Extremities: obese, palpable pulses, LE edema (+) Skin: no ulcers or lesions  Psychiatry: Normal mood and behavior     Data Reviewed: I have personally reviewed following labs and imaging studies  CBC:  Recent Labs Lab 11/30/15 0507 12/01/15 0500 12/03/15 0415 12/03/15 1308 12/04/15 0430 12/05/15 0520  WBC 4.3 4.7 4.0  --  4.9 4.5  HGB 8.0* 7.8* 7.4* 9.5* 7.8* 7.9*  HCT 27.2* 25.5* 24.8* 28.0* 26.0* 26.9*  MCV 97.8 96.6 96.9  --  96.3 97.8  PLT 197 170 142*  --  147* [id number]*   Basic Metabolic Panel:  Recent Labs Lab 11/29/15 0457  12/01/15 0500 12/02/15 0430 12/03/15 0415 12/03/15 1308 12/04/15 0430 12/05/15 0520  NA 138  < > 138 138 137 140 138 139  K 3.9  < > 3.5 3.3* 3.4* 3.6 3.7 3.6  CL 109  < > 103 103 101 102 102 101  CO2 23  < > 25 25 27   --  28 28  GLUCOSE 94  < > 91 124* 87 85 97 87  BUN 62*  < > 54* 48* 42* 40* 41* 40*  CREATININE 2.43*  < > 2.11* 2.06* 1.97* 2.00* 2.06* 2.09*  CALCIUM 8.9  < > 9.1 9.0 9.0  --  9.1 9.1  MG 2.2  --   --   --  1.8  --   --   --   PHOS 4.1  --   --   --   --   --   --   --   < > = values in this interval not displayed. GFR: Estimated Creatinine Clearance: 40.6  mL/min (by C-G formula based on SCr of 2.09  mg/dL). Liver Function Tests:  Recent Labs Lab 11/28/15 1551 11/29/15 0457 12/05/15 0520  AST 23 19 24   ALT 13* 11* 12*  ALKPHOS 84 72 70  BILITOT 1.1 1.7* 1.5*  PROT 6.4* 5.7* 6.1*  ALBUMIN 2.9* 2.6* 2.7*   No results for input(s): LIPASE, AMYLASE in the last 168 hours. No results for input(s): AMMONIA in the last 168 hours. Coagulation Profile: No results for input(s): INR, PROTIME in the last 168 hours. Cardiac Enzymes:  Recent Labs Lab 11/29/15 0457 11/29/15 1057  TROPONINI <0.03 <0.03   BNP (last 3 results) No results for input(s): PROBNP in the last 8760 hours. HbA1C: No results for input(s): HGBA1C in the last 72 hours. CBG:  Recent Labs Lab 12/04/15 1210 12/04/15 1728 12/04/15 2116 12/05/15 0810 12/05/15 1241  GLUCAP 108* 99 110* 79 100*   Lipid Profile: No results for input(s): CHOL, HDL, LDLCALC, TRIG, CHOLHDL, LDLDIRECT in the last 72 hours. Thyroid Function Tests: No results for input(s): TSH, T4TOTAL, FREET4, T3FREE, THYROIDAB in the last 72 hours. Anemia Panel: No results for input(s): VITAMINB12, FOLATE, FERRITIN, TIBC, IRON, RETICCTPCT in the last 72 hours. Urine analysis:    Component Value Date/Time   COLORURINE YELLOW 06/13/2015 0134   APPEARANCEUR CLOUDY (A) 06/13/2015 0134   LABSPEC 1.017 06/13/2015 0134   PHURINE 5.0 06/13/2015 0134   GLUCOSEU NEGATIVE 06/13/2015 0134   HGBUR NEGATIVE 06/13/2015 0134   BILIRUBINUR NEGATIVE 06/13/2015 0134   KETONESUR NEGATIVE 06/13/2015 0134   PROTEINUR 30 (A) 06/13/2015 0134   NITRITE POSITIVE (A) 06/13/2015 0134   LEUKOCYTESUR SMALL (A) 06/13/2015 0134   Sepsis Labs: @LABRCNTIP (procalcitonin:4,lacticidven:4)    Recent Results (from the past 240 hour(s))  MRSA PCR Screening     Status: None   Collection Time: 11/29/15  6:24 AM  Result Value Ref Range Status   MRSA by PCR NEGATIVE NEGATIVE Final      Radiology Studies: Dg Chest 2 View Result  Date: 11/28/2015 CLINICAL DATA: No active cardiopulmonary disease. Electronically Signed   By: Marijo Conception, M.D.   On: 11/28/2015 20:10      Scheduled Meds: . sodium chloride   Intravenous Once  . acetaminophen  650 mg Oral Once  . allopurinol  100 mg Oral Daily  . atorvastatin  20 mg Oral Daily  . insulin aspart  0-9 Units Subcutaneous TID WC  . loratadine  10 mg Oral Daily  . metoprolol succinate  25 mg Oral Daily  . mometasone-formoterol  2 puff Inhalation BID  . pantoprazole  40 mg Oral BID  . potassium chloride  40 mEq Oral BID  . sodium chloride flush  10-40 mL Intracatheter Q12H  . sodium chloride flush  3 mL Intravenous Q12H   Continuous Infusions: . furosemide (LASIX) infusion 12 mg/hr (12/05/15 0600)     LOS: 7 days    Time spent: 15 minutes  Greater than 50% of the time spent on counseling and coordinating the care.   Reyne Dumas, MD [facility] Pager [phone]  If 7PM-7AM, please contact night-coverage [email or web address] Password [email or web address] 12/05/2015, 2:09 PM

## 2015-12-05 NOTE — Progress Notes (Signed)
Occupational Therapy Treatment Patient Details Name: Robyn Porter MRN: [id number] DOB: [birth year] Today's Date: 12/05/2015    History of present illness Robyn Porter is a 65 y.o. female with medical history significant of OHS/OSA, chronic systolic CHF and chronic atrial fibrillation on Xarelto. Found to have: worsening anemia and ascending of fluid overload recent cellulitis Hemoccult positive stools in the setting of chronic melena   OT comments  Pt making progress toward OT goals this session. Able to perform grooming tasks standing at the sink with min guard assist but continues to fatigue quickly with standing tasks. Educated pt on energy conservation strategies but would benefit from further education. Still feel pt would benefit from short term SNF prior to return home. Will continue to follow acutely.   Follow Up Recommendations  SNF;Supervision/Assistance - 24 hour    Equipment Recommendations  None recommended by OT    Recommendations for Other Services      Precautions / Restrictions Precautions Precautions: None Restrictions Weight Bearing Restrictions: No       Mobility Bed Mobility Overal bed mobility: Needs Assistance Bed Mobility: Rolling;Sidelying to Sit Rolling: Supervision Sidelying to sit: Supervision;HOB elevated       General bed mobility comments: Use of rail. No assist needed. Increased time.  Transfers Overall transfer level: Needs assistance Equipment used: Rolling walker (2 wheeled) Transfers: Sit to/from Stand Sit to Stand: Min guard         General transfer comment: Min guard for safety to stand. Stood from [employer], from w/c x2.      Balance Overall balance assessment: Needs assistance Sitting-balance support: Feet supported;No upper extremity supported Sitting balance-Leahy Scale: Fair     Standing balance support: During functional activity Standing balance-Leahy Scale: Poor Standing balance comment: Requires at least 1 UE  support in standing. Stood at sink for ~3 mins to perform ADLs- brushing teeth. SOB. Fatigued. Reports BLEs weak, esp in calves needing to sit.                    ADL Overall ADL's : Needs assistance/impaired     Grooming: Min guard;Standing;Oral care;Wash/dry face;Applying deodorant Grooming Details (indicate cue type and reason): Pt fatigues quickly. Reports leg weakness in standing requiring her to take frequent rest breaks. Pt tolerated grooming in standing x3 minutes prior to rest break.             Lower Body Dressing: Maximal assistance Lower Body Dressing Details (indicate cue type and reason): to don socks. Pt reports she has wide sock aide at home that she is familiar with for use at home. Toilet Transfer: Supervision/safety;+2 for safety/equipment;Ambulation;BSC;RW Toilet Transfer Details (indicate cue type and reason): Simulated by sit to stand from EOB with functional mobility to bathroom for grooming tasks.   Toileting - Clothing Manipulation Details (indicate cue type and reason): Educated pt on use of toilet aide; she verbalized understanding and reports she already has 2 at home if needed.     Functional mobility during ADLs: Supervision/safety;+2 for safety/equipment;Rolling walker General ADL Comments: Pt fatigues quickly during OOB activities. Educated pt on energy conservation strategies and breathing techniques. SpO2 down to 88 during functional mobility but quickly returned to mid 90s with pursed lip breathing technique.      Vision                     Perception     Praxis      Cognition   Behavior During Therapy: [facility] for  tasks assessed/performed Overall Cognitive Status: Within Functional Limits for tasks assessed                       Extremity/Trunk Assessment               Exercises     Shoulder Instructions       General Comments      Pertinent Vitals/ Pain       Pain Assessment: No/denies pain  Home  Living                                          Prior Functioning/Environment              Frequency Min 2X/week     Progress Toward Goals  OT Goals(current goals can now be found in the care plan section)  Progress towards OT goals: Progressing toward goals  Acute Rehab OT Goals Patient Stated Goal: to be able to go home and go on a vacation she has planned for September OT Goal Formulation: With patient  Plan Discharge plan remains appropriate    Co-evaluation    PT/OT/SLP Co-Evaluation/Treatment: Yes Reason for Co-Treatment: For patient/therapist safety PT goals addressed during session: Mobility/safety with mobility;Balance;Strengthening/ROM OT goals addressed during session: ADL's and self-care      End of Session Equipment Utilized During Treatment: Rolling walker   Activity Tolerance Patient tolerated treatment well   Patient Left in chair;with call bell/phone within reach   Nurse Communication Mobility status        Time: [id number] OT Time Calculation (min): 46 min  Charges: OT General Charges $OT Visit: 1 Procedure OT Treatments $Self Care/Home Management : 8-22 mins  Binnie Kand M.S., OTR/L Pager: [phone]  12/05/2015, 12:06 PM

## 2015-12-05 NOTE — Clinical Social Work Note (Signed)
CSW acknowledges SNF consult. Patient going home with HHPT. RNCM aware.  Robyn Porter, [place]

## 2015-12-05 NOTE — Progress Notes (Signed)
Physical Therapy Treatment Patient Details Name: Robyn Porter MRN: [id number] DOB: 1950/11/30 Today's Date: 12/05/2015    History of Present Illness Robyn Porter is a 65 y.o. female with medical history significant of OHS/OSA, chronic systolic CHF and chronic atrial fibrillation on Xarelto. Found to have: worsening anemia and ascending of fluid overload recent cellulitis Hemoccult positive stools in the setting of chronic melena    PT Comments    Patient progressing well towards PT goals. Able to improve ambulation distance but continues to demonstrate dyspnea on exertion with Sp02 dropping to 86% on room air during mobility but able to improve quickly with seated rest break. Pt refusing short term SNF so discharge recommendation updated to home with HHPT. Encouraged OOB to [facility] and chair throughout the day to improve endurance and strength. Pt agreeable. Will follow.   Follow Up Recommendations  Home health PT;Supervision/Assistance - 24 hour     Equipment Recommendations  None recommended by PT    Recommendations for Other Services       Precautions / Restrictions Precautions Precautions: None Restrictions Weight Bearing Restrictions: No    Mobility  Bed Mobility Overal bed mobility: Needs Assistance Bed Mobility: Rolling;Sidelying to Sit Rolling: Supervision Sidelying to sit: Supervision;HOB elevated       General bed mobility comments: Use of rail. No assist needed. Increased time.  Transfers Overall transfer level: Needs assistance Equipment used: Rolling walker (2 wheeled) (bari) Transfers: Sit to/from Stand Sit to Stand: Min guard         General transfer comment: Min guard for safety to stand. Stood from [employer], from w/c x2.  Transferred to bari chair post ambulation.  Ambulation/Gait Ambulation/Gait assistance: Supervision;+2 safety/equipment Ambulation Distance (Feet): 50 Feet (+ 50' +15') Assistive device: Rolling walker (2 wheeled) (bari) Gait  Pattern/deviations: Decreased stride length;Step-through pattern;Step-to pattern;Wide base of support Gait velocity: decreased  Gait velocity interpretation: <1.8 ft/sec, indicative of risk for recurrent falls General Gait Details: Slow, mostly steady gait with increased effort. 2/4 DOE. Fatigues. Sp02 dropped to 86% on RA but able to maintain >91% on second bout on RA. 1 seated rest break.   Stairs            Wheelchair Mobility    Modified Rankin (Stroke Patients Only)       Balance Overall balance assessment: Needs assistance Sitting-balance support: Feet supported;No upper extremity supported Sitting balance-Leahy Scale: Fair     Standing balance support: During functional activity Standing balance-Leahy Scale: Poor Standing balance comment: Requires at least 1 UE support in standing. Stood at sink for ~3 mins to perform ADLs- brushing teeth. SOB. Fatigued. Reports BLEs weak, esp in calves needing to sit.                     Cognition Arousal/Alertness: Awake/alert Behavior During Therapy: WFL for tasks assessed/performed Overall Cognitive Status: Within Functional Limits for tasks assessed                      Exercises      General Comments        Pertinent Vitals/Pain Pain Assessment: No/denies pain    Home Living                      Prior Function            PT Goals (current goals can now be found in the care plan section) Progress towards PT goals: Progressing toward goals  Frequency  Min 3X/week    PT Plan Discharge plan needs to be updated    Co-evaluation PT/OT/SLP Co-Evaluation/Treatment: Yes Reason for Co-Treatment: For patient/therapist safety PT goals addressed during session: Mobility/safety with mobility;Balance;Strengthening/ROM       End of Session Equipment Utilized During Treatment: Gait belt Activity Tolerance: Patient limited by fatigue Patient left: in chair;with call bell/phone within reach      Time: 0943-1028 PT Time Calculation (min) (ACUTE ONLY): 45 min  Charges:  $Gait Training: 23-37 mins                    G Codes:      Sharnise Blough A Meagan Spease 12/05/2015, 11:29 AM Wray Kearns, [place], DPT [phone]

## 2015-12-05 NOTE — Care Management Important Message (Signed)
Important Message  Patient Details  Name: Robyn Porter MRN: [id number] Date of Birth: Jan 28, 1951   Medicare Important Message Given:  Yes    [name] 12/05/2015, 3:03 PM

## 2015-12-05 NOTE — Care Management Note (Addendum)
Case Management Note  Patient Details  Name: Adrianny Slisz MRN: [id number] Date of Birth: 1950-06-15  Subjective/Objective:   Patient chose [facility] for [facility] services ,since she has had them before, NCM made referral to [place] [phone], fax [phone]   For [place], [place], [place], per [place] Basta the rep there states they do not have [place] and it will be Mon or Tues of next week before they can go out to see her, patient was ok with that.  Soc will begin 24-48 hrs post dc.    8/10- Per MD note ,   Volume status coming down. CVP 11-2. Continue lasix drip + metolazone today. Anticipate switching to oral diuretics tomorrow.             Action/Plan:   Expected Discharge Date:                  Expected Discharge Plan:  [place]  In-House Referral:  Clinical Social Work  Discharge planning Services  CM Consult  Post Acute Care Choice:    Choice offered to:  Patient  DME Arranged:    DME Agency:     HH Arranged:  RN, PT [place] Agency:  [facility]  Status of Service:  Completed, signed off  If discussed at [place] of Stay Meetings, dates discussed:    Additional Comments:  Zenon Mayo, RN 12/05/2015, 2:50 PM

## 2015-12-05 NOTE — Progress Notes (Signed)
Pt. States she can place her own cpap on when she is ready. RT made sure water chamber was full.

## 2015-12-06 LAB — GLUCOSE, CAPILLARY
GLUCOSE-CAPILLARY: 80 mg/dL (ref 65–99)
GLUCOSE-CAPILLARY: 124 mg/dL — AB (ref 65–99)
Glucose-Capillary: 121 mg/dL — ABNORMAL HIGH (ref 65–99)
Glucose-Capillary: 92 mg/dL (ref 65–99)

## 2015-12-06 LAB — COMPREHENSIVE METABOLIC PANEL
ALT: 12 U/L — AB (ref 14–54)
ANION GAP: 11 (ref 5–15)
AST: 25 U/L (ref 15–41)
Albumin: 2.9 g/dL — ABNORMAL LOW (ref 3.5–5.0)
Alkaline Phosphatase: 79 U/L (ref 38–126)
BUN: 40 mg/dL — ABNORMAL HIGH (ref 6–20)
CALCIUM: 9.4 mg/dL (ref 8.9–10.3)
CHLORIDE: 99 mmol/L — AB (ref 101–111)
CO2: 28 mmol/L (ref 22–32)
CREATININE: 2.19 mg/dL — AB (ref 0.44–1.00)
GFR, EST AFRICAN AMERICAN: 26 mL/min — AB (ref >60)
GFR, EST NON AFRICAN AMERICAN: 22 mL/min — AB (ref >60)
Glucose, Bld: 83 mg/dL (ref 65–99)
Potassium: 3.9 mmol/L (ref 3.5–5.1)
SODIUM: 138 mmol/L (ref 135–145)
Total Bilirubin: 1.7 mg/dL — ABNORMAL HIGH (ref 0.3–1.2)
Total Protein: 6.6 g/dL (ref 6.5–8.1)

## 2015-12-06 LAB — CBC
HCT: 27.9 % — ABNORMAL LOW (ref 36.0–46.0)
Hemoglobin: 8.2 g/dL — ABNORMAL LOW (ref 12.0–15.0)
MCH: 28.1 pg (ref 26.0–34.0)
MCHC: 29.4 g/dL — AB (ref 30.0–36.0)
MCV: 95.5 fL (ref 78.0–100.0)
PLATELETS: 154 10*3/uL (ref 150–400)
RBC: 2.92 MIL/uL — AB (ref 3.87–5.11)
RDW: 17.8 % — ABNORMAL HIGH (ref 11.5–15.5)
WBC: 5.6 10*3/uL (ref 4.0–10.5)

## 2015-12-06 MED ORDER — METOLAZONE 2.5 MG PO TABS
5.0000 mg | ORAL_TABLET | Freq: Once | ORAL | Status: AC
  Administered 2015-12-06: 5 mg via ORAL
  Filled 2015-12-06: qty 2

## 2015-12-06 NOTE — Progress Notes (Signed)
Late entry - STJ PPM interrogated 11/29/15.  See paper chart for full details Normal device function. CRT pacing 94% of the time. Dependent to 30.  6% PVC's Thresholds and other lead measurements stable. No changes made  Chanetta Marshall, NP 12/06/2015 10:38 AM  Trude Mcburney

## 2015-12-06 NOTE — Progress Notes (Signed)
CARDIAC REHAB PHASE I   PRE:  Rate/Rhythm: 65 paced  BP:  Supine:125/51   Sitting:   Standing:    SaO2: 98%RA  MODE:  Ambulation: 125 ft   POST:  Rate/Rhythm: 81 SR  BP:  Supine:   Sitting: 121/74  Standing:    SaO2: 93-96%RA 1125-1207 Pt very motivated to walk today. Walked 125 ft on RA with rolling walker and asst x 1and  one asst to follow with chair. Pt sat three times to rest but increased distance. Sats checked when sitting to rest and 93-96%. To recliner after walk. DOE but tolerated well knowing when she needs to rest.   Graylon Good, RN BSN  12/06/2015 12:01 PM

## 2015-12-06 NOTE — Progress Notes (Signed)
Patient ID: Robyn Porter, female   DOB: July 24, 1950, 65 y.o.   MRN: [id number]  PROGRESS NOTE    Robyn Porter  [id number] DOB: 10/06/1950 DOA: 11/28/2015  PCP: Michell Heinrich, DO   Brief Narrative:  65 yo with history of OHS/OSA, chronic systolic and diastolic CHF (last 2 D ECHO in 05/2015 showed EF 45%), atrial fibrillation on xarelto. She was admitted in 2/17 to [facility] with massive volume overload. She was aggressively diuresed in the hospital and lost around 70 lbs. She had BRBPR in the hospital, this was thought to be hemorrhoidal bleeding (had colonoscopy). She was subsequently discharged to SNF. She was admitted 09/19/15 - 09/21/15 for upgrade to CRT-P . She was noted to be markedly volume overloaded so HF team took over for diuresis. Overall diuresed 5.5 L, though weights were thought to be innacurate. Discharge weight was 363 lbs. Her weight has gone up about 10 lbs after decreasing her diuretics for worsening creatinine.    She was admitted for low hemoglobin. On admission, hemoglobin was 7.1. She has been transfused 2 U PRBC so far. GI has seen her in consultation. EGD with no acute pathology. Status post colonoscopy on 8/7. Cardiology is following for volume overload.   Assessment & Plan:    Acute blood loss anemia / Acute GI bleed / Anemia of chronic kidney disease - Hemoglobin 7.1 on admission. She also has chronic anemia due to CKD,   - FOBT was positive on this admission and she had dark stools (noted pt also on iron) - Xarelto stopped due to risk of bleed ,  Cards recommends to restart it  - Continue protonix  - EGD done 8/3 but no pathology noted,  - Capsule endoscopy showed some blood in very distal ileum /IC valve area   Colonoscopy 8/ 7 showed  few recently bleeding colonic angiodysplastic lesions. Treated with argon plasma coagulation (APC).  Xarelto on hold. On PPI.  To resume Xarelto 12/10/15 per GI.    CKD stage 4 - Baseline Cr 2.51 about 1 month ago, now  around 2.0, stable for several days on lasix gtt -      Dyslipidemia associated with type 2 DM - Continue atorvastatin 20 mg daily     OSA (obstructive sleep apnea) / Pulmonary hypertension - CPAP at bedtime    Acute on chronic systolic and diastolic CHF  / Hypokalemia  - Last 2 D ECHO in 05/2015 with EF 40-45%, moderate pulmonary hypertension  - Appreciate cardio following Continue lasix drip + metolazone today. Anticipate switching to oral diuretics tomorrow. Prior to admit she was on torsemide 60 mg twice daily. Anticipate switching to torsemide to 80 mg twice daily - Continue daily weight and strict intake and output  CRT device interrogated, she is BiV pacing appropriately (94% BiV pacing) and EF is higher than prior.       Chronic atrial fibrillation - CHADS vasc score 5 (HTN, age, gender, CHF, DM) - Off of xarelto due to GI bleed,holding Xarelto until 12/10/15 - Rate controlled with metoprolol 25 mg daily     Essential hypertension - Continue metoprolol 25 mg daily     Morbid obesity due to excess calories with comorbidities (HTN, DM, Dyslipidemia) - Body mass index is 60.94 kg/m. 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 CVP coming down  Objective: Vitals:   12/06/15 0751 12/06/15 0800 12/06/15 0900 12/06/15 0952  BP: (!) 112/96     Pulse: 64 66 64     Resp: 14 19 13    Temp: 97.7 F (36.5 C)     TempSrc: Oral     SpO2: 98% 94% 94% 100%  Weight:      Height:        Intake/Output Summary (Last 24 hours) at 12/06/15 1154 Last data filed at 12/06/15 0900  Gross per 24 hour  Intake             1044 ml  Output             4125 ml  Net            -3081 ml   Filed Weights   12/04/15 0851 12/05/15 0806 12/05/15 1000  Weight: (!) 160.3 kg (353 lb 8 oz) (!) 167.8 kg (370 lb) (!) 161 kg (355 lb)    Examination:  General exam: no acute distress  Respiratory system: No wheezing, no rhonchi  Cardiovascular system: S1 & S2 heard, rate controlled  Gastrointestinal system: Obese, non tender, (+) BS Central nervous system: Nonfocal  Extremities: obese, palpable pulses, LE edema (+) Skin: no ulcers or lesions  Psychiatry: Normal mood and behavior     Data Reviewed: I have personally reviewed following labs and imaging studies  CBC:  Recent Labs Lab 12/01/15 0500 12/03/15 0415 12/03/15 1308 12/04/15 0430 12/05/15 0520 12/06/15 0610  WBC 4.7 4.0  --  4.9 4.5 5.6  HGB 7.8* 7.4* 9.5* 7.8* 7.9* 8.2*  HCT 25.5* 24.8* 28.0* 26.0* 26.9* 27.9*  MCV 96.6 96.9  --  96.3 97.8 95.5  PLT 170 142*  --  147* 129* [id number]   Basic Metabolic Panel:  Recent Labs Lab 12/02/15 0430 12/03/15 0415 12/03/15 1308 12/04/15 0430 12/05/15 0520 12/06/15 0610  NA 138 137 140 138 139 138  K 3.3* 3.4* 3.6 3.7 3.6 3.9  CL 103 101 102 102 101 99*  CO2 25 27  --  28 28 28   GLUCOSE 124* 87 85 97 87 83  BUN 48* 42* 40* 41* 40* 40*  CREATININE 2.06* 1.97* 2.00* 2.06* 2.09* 2.19*  CALCIUM 9.0 9.0  --  9.1 9.1 9.4  MG  --  1.8  --   --   --   --    GFR: Estimated Creatinine Clearance: 38.8 mL/min (by C-G formula based on SCr of 2.19 mg/dL). Liver Function Tests:  Recent Labs Lab 12/05/15 0520 12/06/15 0610  AST 24 25  ALT 12* 12*  ALKPHOS 70 79  BILITOT 1.5* 1.7*  PROT 6.1* 6.6  ALBUMIN 2.7* 2.9*   No results for input(s): LIPASE, AMYLASE in  the last 168 hours. No results for input(s): AMMONIA in the last 168 hours. Coagulation Profile: No results for input(s): INR, PROTIME in the last 168 hours. Cardiac Enzymes: No results for input(s): CKTOTAL, CKMB, CKMBINDEX, TROPONINI in the last 168  hours. BNP (last 3 results) No results for input(s): PROBNP in the last 8760 hours. HbA1C: No results for input(s): HGBA1C in the last 72 hours. CBG:  Recent Labs Lab 12/05/15 0810 12/05/15 1241 12/05/15 1541 12/05/15 2253 12/06/15 0748  GLUCAP 79 100* 98 98 80   Lipid Profile: No results for input(s): CHOL, HDL, LDLCALC, TRIG, CHOLHDL, LDLDIRECT in the last 72 hours. Thyroid Function Tests: No results for input(s): TSH, T4TOTAL, FREET4, T3FREE, THYROIDAB in the last 72 hours. Anemia Panel: No results for input(s): VITAMINB12, FOLATE, FERRITIN, TIBC, IRON, RETICCTPCT in the last 72 hours. Urine analysis:    Component Value Date/Time   COLORURINE YELLOW 06/13/2015 0134   APPEARANCEUR CLOUDY (A) 06/13/2015 0134   LABSPEC 1.017 06/13/2015 0134   PHURINE 5.0 06/13/2015 0134   GLUCOSEU NEGATIVE 06/13/2015 0134   HGBUR NEGATIVE 06/13/2015 0134   BILIRUBINUR NEGATIVE 06/13/2015 0134   KETONESUR NEGATIVE 06/13/2015 0134   PROTEINUR 30 (A) 06/13/2015 0134   NITRITE POSITIVE (A) 06/13/2015 0134   LEUKOCYTESUR SMALL (A) 06/13/2015 0134   Sepsis Labs: @LABRCNTIP (procalcitonin:4,lacticidven:4)    Recent Results (from the past 240 hour(s))  MRSA PCR Screening     Status: None   Collection Time: 11/29/15  6:24 AM  Result Value Ref Range Status   MRSA by PCR NEGATIVE NEGATIVE Final      Radiology Studies: Dg Chest 2 View Result Date: 11/28/2015 CLINICAL DATA: No active cardiopulmonary disease. Electronically Signed   By: Marijo Conception, M.D.   On: 11/28/2015 20:10      Scheduled Meds: . sodium chloride   Intravenous Once  . acetaminophen  650 mg Oral Once  . allopurinol  100 mg Oral Daily  . atorvastatin  20 mg Oral  Daily  . insulin aspart  0-9 Units Subcutaneous TID WC  . loratadine  10 mg Oral Daily  . metolazone  5 mg Oral Once  . metoprolol succinate  25 mg Oral Daily  . mometasone-formoterol  2 puff Inhalation BID  . pantoprazole  40 mg Oral BID  . potassium chloride  40 mEq Oral BID  . sodium chloride flush  10-40 mL Intracatheter Q12H  . sodium chloride flush  3 mL Intravenous Q12H   Continuous Infusions: . furosemide (LASIX) infusion 12 mg/hr (12/06/15 0800)     LOS: 8 days    Time spent: 15 minutes  Greater than 50% of the time spent on counseling and coordinating the care.   Reyne Dumas, MD [facility] Pager [phone]  If 7PM-7AM, please contact night-coverage [email or web address] Password [email or web address] 12/06/2015, 11:54 AM

## 2015-12-06 NOTE — Progress Notes (Addendum)
Advanced Heart Failure Rounding Note  Primary Cardiologist: Aundra Dubin Reason for Consultation: CHF  Subjective:    Admitted 11/28/15 with anemia.  Hgb 7.8 this am. Colonoscopy 12/03/15 with several lesions that were treated with APC.Echo (11/29/15) with EF 50-55%, mildly dilated RV, moderate AS with mild to moderate AI.   Yesterday diuresed with lasix drip + metolazone. Brisk diuresis noted. CVP coming down slwoly. .   Denies SOB/orthpnea. Walking with PT.  Objective:   Weight Range: (!) 355 lb (161 kg) Body mass index is 60.94 kg/m.   Vital Signs:   Temp:  [97.4 F (36.3 C)-98.1 F (36.7 C)] 97.7 F (36.5 C) (08/10 0751) Pulse Rate:  [60-66] 64 (08/10 0900) Resp:  [13-20] 13 (08/10 0900) BP: (106-140)/(51-96) 112/96 (08/10 0751) SpO2:  [94 %-100 %] 100 % (08/10 0952) Last BM Date: 12/04/15  Weight change: Filed Weights   12/04/15 0851 12/05/15 0806 12/05/15 1000  Weight: (!) 353 lb 8 oz (160.3 kg) (!) 370 lb (167.8 kg) (!) 355 lb (161 kg)    Intake/Output:   Intake/Output Summary (Last 24 hours) at 12/06/15 1034 Last data filed at 12/06/15 0900  Gross per 24 hour  Intake             1044 ml  Output             4350 ml  Net            -3306 ml     Physical Exam: CVP 14-15 General: NAD. In chair Neck: Thick, JVP to jaw  no thyromegaly or thyroid nodule.  Lungs: Diminished throughout CV: Nondisplaced PMI.  Heart regular S1/S2, no [id number], 3/6 systolic crescendo-decrescendo murmur with obscured S2. Trace  RLE LLE edema.  No carotid bruit.  Normal pedal pulses.  Abdomen: Morbidly obese, soft, NT, ND, no HSM. No bruits or masses. +BS  Skin: Intact without lesions or rashes.  Neurologic: Alert and oriented x 3.  Psych: Normal affect. Extremities: No clubbing or cyanosis.  HEENT: Normal.   Telemetry: A fib 60s   Labs: CBC  Recent Labs  12/05/15 0520 12/06/15 0610  WBC 4.5 5.6  HGB 7.9* 8.2*  HCT 26.9* 27.9*  MCV 97.8 95.5  PLT 129* [id number]   Basic Metabolic  Panel  Recent Labs  12/05/15 0520 12/06/15 0610  NA 139 138  K 3.6 3.9  CL 101 99*  CO2 28 28  GLUCOSE 87 83  BUN 40* 40*  CREATININE 2.09* 2.19*  CALCIUM 9.1 9.4   Liver Function Tests  Recent Labs  12/05/15 0520 12/06/15 0610  AST 24 25  ALT 12* 12*  ALKPHOS 70 79  BILITOT 1.5* 1.7*  PROT 6.1* 6.6  ALBUMIN 2.7* 2.9*   No results for input(s): LIPASE, AMYLASE in the last 72 hours. Cardiac Enzymes No results for input(s): CKTOTAL, CKMB, CKMBINDEX, TROPONINI in the last 72 hours.  BNP: BNP (last 3 results)  Recent Labs  06/12/15 2328 07/09/15 1158 07/19/15 1124  BNP 940.9* 133.0* 149.0*    ProBNP (last 3 results) No results for input(s): PROBNP in the last 8760 hours.   D-Dimer No results for input(s): DDIMER in the last 72 hours. Hemoglobin A1C No results for input(s): HGBA1C in the last 72 hours. Fasting Lipid Panel No results for input(s): CHOL, HDL, LDLCALC, TRIG, CHOLHDL, LDLDIRECT in the last 72 hours. Thyroid Function Tests No results for input(s): TSH, T4TOTAL, T3FREE, THYROIDAB in the last 72 hours.  Invalid input(s): FREET3  Other results:  Imaging/Studies:  No results found.  Latest Echo  Latest Cath   Medications:     Scheduled Medications: . sodium chloride   Intravenous Once  . acetaminophen  650 mg Oral Once  . allopurinol  100 mg Oral Daily  . atorvastatin  20 mg Oral Daily  . insulin aspart  0-9 Units Subcutaneous TID WC  . loratadine  10 mg Oral Daily  . metoprolol succinate  25 mg Oral Daily  . mometasone-formoterol  2 puff Inhalation BID  . pantoprazole  40 mg Oral BID  . potassium chloride  40 mEq Oral BID  . sodium chloride flush  10-40 mL Intracatheter Q12H  . sodium chloride flush  3 mL Intravenous Q12H    Infusions: . furosemide (LASIX) infusion 12 mg/hr (12/06/15 0800)    PRN Medications: acetaminophen **OR** acetaminophen, HYDROcodone-acetaminophen, ipratropium-albuterol, ondansetron **OR**  ondansetron (ZOFRAN) IV, sodium chloride flush   Assessment/Plan   65 yo with morbid obesity, chronic primarily diastolic CHF (EF [id number]), CKD stage III was admitted with anemia and suspected GI bleeding, also with suspected CHF exacerbation given significant weight gain.   1. GI bleeding/anemia: Heme+.  Has dark stools but also on iron.  Has had periodic hematochezia that she attributes to hemorrhoids.  Has had 2 units PRBCs so far.  EGD on 8/3 with no source of bleeding.  - Capsule endo with blood in very distal ileum/IC valve area.   - Colonoscopy 12/03/15 with several lesions treated with APC.   - Xarelto on hold. On PPI.  To resume Xarelto 12/10/15 per GI.  2. Acute on chronic primarily diastolic CHF: EF [id number] with mild RV dilation on echo 11/29/15.  PICC Line placed.  -  Volume status coming down. CVP 11-2. Continue lasix drip + metolazone today. Anticipate switching to oral diuretics tomorrow. Prior to admit she was on torsemide 60 mg twice daily. Anticipate switching to torsemide to 80 mg twice daily.  - Continue Toprol XL 25 mg daily. - CRT device interrogated, she is BiV pacing appropriately (94% BiV pacing) and EF is higher than prior.  - Need daily weights and strict I/Os.  3. AKI on CKD Stage III: - Renal function stable. Repeat BMET in am.   - Marked anemia ( initial Hgb in 6 range at PCP) likely contributed to AKI as well. - She has renal appointment scheduled for 12/12/15 with [facility] - Dr. Hollie Salk 4. Murmur: Moderate AS with mild/mod AI  on Echo 11/29/15.   5. Bradycardia: MDT CRT-P device functioning appropriately.  6. Atrial fibrillation: Chronic -off Xarelto due to GI bleeding. . - Rate controlled. -GI recommends holding Xarelto until 12/10/15. 7. Anemia- Check iron stores in am. May need feraheme 8. Immobility- Working with PT.   Disposition- Refuses SNF. Agreeable to HF. CM referred to [facility] for HHPT/OT/RN.      Length of Stay: [place]  NP-C  12/06/2015, 10:34 AM  Advanced Heart Failure Team Pager [phone] (M-F; [place])  Please contact [facility] for night-coverage after hours (4p -7a ) and weekends on [email or web address]   Patient seen and examined with Darrick Grinder, NP. We discussed all aspects of the encounter. I agree with the assessment and plan as stated above.   CVP checked personally. Remains 14-15. Continue IV diuresis. Continue to monitor renal function and electrolytes. Hgb stable. Will check iron stores. GI recommends holding Xarelto (for AF) one more week,   Demetrios Byron,MD 11:27 PM

## 2015-12-07 ENCOUNTER — Other Ambulatory Visit (HOSPITAL_COMMUNITY): Payer: Self-pay | Admitting: Cardiology

## 2015-12-07 LAB — GLUCOSE, CAPILLARY
GLUCOSE-CAPILLARY: 112 mg/dL — AB (ref 65–99)
GLUCOSE-CAPILLARY: 108 mg/dL — AB (ref 65–99)
GLUCOSE-CAPILLARY: 103 mg/dL — AB (ref 65–99)
Glucose-Capillary: 146 mg/dL — ABNORMAL HIGH (ref 65–99)

## 2015-12-07 LAB — CBC
HEMATOCRIT: 28 % — AB (ref 36.0–46.0)
HEMOGLOBIN: 8.4 g/dL — AB (ref 12.0–15.0)
MCH: 28.8 pg (ref 26.0–34.0)
MCHC: 30 g/dL (ref 30.0–36.0)
MCV: 95.9 fL (ref 78.0–100.0)
Platelets: 156 10*3/uL (ref 150–400)
RBC: 2.92 MIL/uL — AB (ref 3.87–5.11)
RDW: 17.8 % — ABNORMAL HIGH (ref 11.5–15.5)
WBC: 5.8 10*3/uL (ref 4.0–10.5)

## 2015-12-07 LAB — IRON AND TIBC
Iron: 28 ug/dL (ref 28–170)
Saturation Ratios: 7 % — ABNORMAL LOW (ref 10.4–31.8)
TIBC: 409 ug/dL (ref 250–450)
UIBC: 381 ug/dL

## 2015-12-07 LAB — VITAMIN B12: VITAMIN B 12: 1618 pg/mL — AB (ref 180–914)

## 2015-12-07 LAB — RETICULOCYTES
RBC.: 2.92 MIL/uL — AB (ref 3.87–5.11)
RETIC COUNT ABSOLUTE: 61.3 10*3/uL (ref 19.0–186.0)
RETIC CT PCT: 2.1 % (ref 0.4–3.1)

## 2015-12-07 LAB — BASIC METABOLIC PANEL
Anion gap: 12 (ref 5–15)
BUN: 40 mg/dL — AB (ref 6–20)
CHLORIDE: 95 mmol/L — AB (ref 101–111)
CO2: 30 mmol/L (ref 22–32)
Calcium: 9.4 mg/dL (ref 8.9–10.3)
Creatinine, Ser: 2.16 mg/dL — ABNORMAL HIGH (ref 0.44–1.00)
GFR calc Af Amer: 26 mL/min — ABNORMAL LOW (ref >60)
GFR calc non Af Amer: 23 mL/min — ABNORMAL LOW (ref >60)
GLUCOSE: 89 mg/dL (ref 65–99)
POTASSIUM: 3.5 mmol/L (ref 3.5–5.1)
Sodium: 137 mmol/L (ref 135–145)

## 2015-12-07 LAB — FOLATE: FOLATE: 29.9 ng/mL (ref >5.9)

## 2015-12-07 LAB — FERRITIN: FERRITIN: 141 ng/mL (ref 11–307)

## 2015-12-07 MED ORDER — TORSEMIDE 20 MG PO TABS
80.0000 mg | ORAL_TABLET | Freq: Two times a day (BID) | ORAL | Status: DC
  Administered 2015-12-07 – 2015-12-09 (×4): 80 mg via ORAL
  Filled 2015-12-07 (×4): qty 4

## 2015-12-07 MED ORDER — METOLAZONE 2.5 MG PO TABS: 5.0000 mg | ORAL_TABLET | Freq: Once | ORAL | Status: DC

## 2015-12-07 MED ORDER — POTASSIUM CHLORIDE CRYS ER 20 MEQ PO TBCR
60.0000 meq | EXTENDED_RELEASE_TABLET | Freq: Once | ORAL | Status: AC
  Administered 2015-12-07: 60 meq via ORAL
  Filled 2015-12-07: qty 3

## 2015-12-07 MED ORDER — SODIUM CHLORIDE 0.9 % IV SOLN
510.0000 mg | Freq: Once | INTRAVENOUS | Status: AC
  Administered 2015-12-07: 510 mg via INTRAVENOUS
  Filled 2015-12-07 (×2): qty 17

## 2015-12-07 MED ORDER — METOLAZONE 2.5 MG PO TABS
5.0000 mg | ORAL_TABLET | Freq: Two times a day (BID) | ORAL | Status: AC
  Administered 2015-12-07 (×2): 5 mg via ORAL
  Filled 2015-12-07 (×2): qty 2

## 2015-12-07 NOTE — Progress Notes (Signed)
Physical Therapy Treatment Patient Details Name: Robyn Porter MRN: [id number] DOB: 07-03-1950 Today's Date: 12/07/2015    History of Present Illness Robyn Porter is a 65 y.o. female with medical history significant of OHS/OSA, chronic systolic CHF and chronic atrial fibrillation on Xarelto. Found to have: worsening anemia and ascending of fluid overload recent cellulitis Hemoccult positive stools in the setting of chronic melena    PT Comments    Making progress with mobility and gait.  Continues to fatigue quickly.  Follow Up Recommendations  Home health PT;Supervision/Assistance - 24 hour     Equipment Recommendations  None recommended by PT    Recommendations for Other Services       Precautions / Restrictions Precautions Precautions: Fall Restrictions Weight Bearing Restrictions: No    Mobility  Bed Mobility Overal bed mobility: Needs Assistance Bed Mobility: Sit to Supine Rolling: Min guard     Sit to supine: Mod assist   General bed mobility comments: Assist to bring LE's onto bed to return to supine  Transfers Overall transfer level: Needs assistance Equipment used: Rolling walker (2 wheeled) Transfers: Sit to/from [facility] Sit to Stand: Min guard Stand pivot transfers: Min guard       General transfer comment: Patient using correct hand placement.  Assist for safety/balance.  Cues to return to sitting in controlled manner.  Ambulation/Gait Ambulation/Gait assistance: Min guard;+2 safety/equipment Ambulation Distance (Feet): 150 Feet (with 3 sitting rest breaks) Assistive device: Rolling walker (2 wheeled) Gait Pattern/deviations: Step-through pattern;Decreased step length - right;Decreased step length - left;Decreased stride length;Shuffle;Trunk flexed Gait velocity: decreased  Gait velocity interpretation: Below normal speed for age/gender General Gait Details: Patient with slow gait, with trunk flexed and head down.  Cues to  look up during gait.  HR initially 73 bpm.  Ranged between 64 - 91 bpm during gait.  O2 sats remained in 90's during gait - lowest noted was 94%.  Patient did fatigue quickly, with dyspnea 4/4.  Required 3 rest breaks.   Stairs            Wheelchair Mobility    Modified Rankin (Stroke Patients Only)       Balance Overall balance assessment: Needs assistance Sitting-balance support: No upper extremity supported;Feet supported Sitting balance-Leahy Scale: Fair     Standing balance support: Bilateral upper extremity supported Standing balance-Leahy Scale: Poor                      Cognition Arousal/Alertness: Awake/alert Behavior During Therapy: WFL for tasks assessed/performed Overall Cognitive Status: Within Functional Limits for tasks assessed                      Exercises      General Comments        Pertinent Vitals/Pain Pain Assessment: No/denies pain    Home Living                      Prior Function            PT Goals (current goals can now be found in the care plan section) Acute Rehab PT Goals Patient Stated Goal: to be able to go home and go on a vacation she has planned for September Progress towards PT goals: Progressing toward goals    Frequency  Min 3X/week    PT Plan Current plan remains appropriate    Co-evaluation  End of Session Equipment Utilized During Treatment: Gait belt Activity Tolerance: Patient limited by fatigue Patient left: in bed;with call bell/phone within reach     Time: 1412-1450 PT Time Calculation (min) (ACUTE ONLY): 38 min  Charges:  $Gait Training: 38-52 mins                    G Codes:      Despina Pole December 14, 2015, 3:58 PM Carita Pian. Sanjuana Kava, [place] Pager [phone]

## 2015-12-07 NOTE — Care Management Note (Signed)
Case Management Note  Patient Details  Name: Robyn Porter MRN: [id number] Date of Birth: [birth year]  Subjective/Objective:     Patient conts to be on lasix drip with metalazone, cvp 14-15 , slowly  Coming down, BUN 40, cr 2.19.                Action/Plan: [facility] services for [facility], HHPT, ( they do not have [place] which patient is ok not to have).  They will be able to start care on Monday or Tuesday if she is dc over the weekend. NCM will continue to follow for dc needs.   Expected Discharge Date:                  Expected Discharge Plan:  [place]  In-House Referral:  Clinical Social Work  Discharge planning Services  CM Consult  Post Acute Care Choice:    Choice offered to:  Patient  DME Arranged:    DME Agency:     HH Arranged:  RN, PT [place] Agency:  [facility]  Status of Service:  Completed, signed off  If discussed at [place] of Stay Meetings, dates discussed:    Additional Comments:  Zenon Mayo, RN 12/07/2015, 11:51 AM

## 2015-12-07 NOTE — Progress Notes (Signed)
Porter ID: Robyn Porter, female   DOB: 1951/03/05, 65 y.o.   MRN: [id number]  PROGRESS NOTE    Robyn Porter  [id number] DOB: 03-31-1951 DOA: 11/28/2015  PCP: Michell Heinrich, DO   Brief Narrative:  65 yo with history of OHS/OSA, chronic systolic and diastolic CHF (last 2 D ECHO in 05/2015 showed EF 45%), atrial fibrillation on xarelto. She was admitted in 2/17 to [facility] with massive volume overload. She was aggressively diuresed in the hospital and lost around 70 lbs. She had BRBPR in the hospital, this was thought to be hemorrhoidal bleeding (had colonoscopy). She was subsequently discharged to SNF. She was admitted 09/19/15 - 09/21/15 for upgrade to CRT-P . She was noted to be markedly volume overloaded so HF team took over for diuresis. Overall diuresed 5.5 L, though weights were thought to be innacurate. Discharge weight was 363 lbs. Her weight has gone up about 10 lbs after decreasing her diuretics for worsening creatinine.    She was admitted for low hemoglobin. On admission, hemoglobin was 7.1. She has been transfused 2 U PRBC so far. GI has seen her in consultation. EGD with no acute pathology. Status post colonoscopy on 8/7. Cardiology is following for volume overload.   Assessment & Plan:    Acute blood loss anemia / Acute GI bleed / Anemia of chronic kidney disease - Hemoglobin 7.1 on admission. She also has chronic anemia due to CKD,   - FOBT was positive on this admission and she had dark stools (noted pt also on iron) - Xarelto stopped due to risk of bleed ,  Cards recommends to restart it  - Continue protonix  - EGD done 8/3 but no pathology noted,  - Capsule endoscopy showed some blood in very distal ileum /IC valve area   Colonoscopy 8/ 7 showed  few recently bleeding colonic angiodysplastic lesions. Treated with argon plasma coagulation (APC).  Xarelto on hold. On PPI.  To resume Xarelto 12/10/15 per GI.    CKD stage 4 - Baseline Cr 2.51 about 1 month ago, now  around 2.0, stable for several days on lasix gtt -      Dyslipidemia associated with type 2 DM - Continue atorvastatin 20 mg daily     OSA (obstructive sleep apnea) / Pulmonary hypertension - CPAP at bedtime    Acute on chronic systolic and diastolic CHF  / Hypokalemia  - Last 2 D ECHO in 05/2015 with EF 40-45%, moderate pulmonary hypertension  - Appreciate cardio following Continue lasix drip + metolazone today. Anticipate switching to oral diuretics tomorrow. Prior to admit she was on torsemide 60 mg twice daily. Anticipate switching to torsemide to 80 mg twice daily - Continue daily weight and strict intake and output  CRT device interrogated, she is BiV pacing appropriately (94% BiV pacing) and EF is higher than prior.       Chronic atrial fibrillation - CHADS vasc score 5 (HTN, age, gender, CHF, DM) - Off of xarelto due to GI bleed,holding Xarelto until 12/10/15 - Rate controlled with metoprolol 25 mg daily     Essential hypertension - Continue metoprolol 25 mg daily     Morbid obesity due to excess calories with comorbidities (HTN, DM, Dyslipidemia) - Body mass index is 59.15 kg/m. 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 - She is on exenatide at home which could be also for weight loss - A1c is 5.1  - Continue SSI   DVT  prophylaxis: SCD's bilaterally  Code Status: full code  Family Communication: nephew in room Disposition Plan: home once clear by GI, cardio  Vs snf , cont stepdown for CVP monitoring    Consultants:   GI  Cardiology   Procedures:   EGD 11/29/2015 - normal esophagus, stomach, no pathology noted  ECHO 11/29/2015 - EF 50-55%  Capsule endoscopy 8/4  Colonoscopy 8/7  Antimicrobials:   None    Subjective: Sitting in chair, NAD, very pleasant, denies chest pain, nephew in room Documented urine output 4liters in the last 24hrs, documented cvp 19, cr 2.16  Objective: Vitals:    12/06/15 2336 12/07/15 0435 12/07/15 0500 12/07/15 0814  BP: (!) 136/54 (!) 119/57    Pulse: 70 66    Resp: 17 15    Temp: 98.1 F (36.7 C) 98.2 F (36.8 C)    TempSrc: Oral Oral    SpO2: 96% 97%  100%  Weight:   (!) 156.3 kg (344 lb 9.6 oz)   Height:        Intake/Output Summary (Last 24 hours) at 12/07/15 0852 Last data filed at 12/07/15 0600  Gross per 24 hour  Intake              744 ml  Output             4000 ml  Net            -3256 ml   Filed Weights   12/05/15 0806 12/05/15 1000 12/07/15 0500  Weight: (!) 167.8 kg (370 lb) (!) 161 kg (355 lb) (!) 156.3 kg (344 lb 9.6 oz)    Examination:  General exam: no acute distress  Respiratory system: No wheezing, no rhonchi  Cardiovascular system: S1 & S2 heard, rate controlled  Gastrointestinal system: Obese, non tender, (+) BS Central nervous system: Nonfocal  Extremities: obese, palpable pulses, LE edema (+) Skin: no ulcers or lesions  Psychiatry: Normal mood and behavior     Data Reviewed: I have personally reviewed following labs and imaging studies  CBC:  Recent Labs Lab 12/03/15 0415 12/03/15 1308 12/04/15 0430 12/05/15 0520 12/06/15 0610 12/07/15 0510  WBC 4.0  --  4.9 4.5 5.6 5.8  HGB 7.4* 9.5* 7.8* 7.9* 8.2* 8.4*  HCT 24.8* 28.0* 26.0* 26.9* 27.9* 28.0*  MCV 96.9  --  96.3 97.8 95.5 95.9  PLT 142*  --  147* 129* 154 [id number]   Basic Metabolic Panel:  Recent Labs Lab 12/02/15 0430 12/03/15 0415 12/03/15 1308 12/04/15 0430 12/05/15 0520 12/06/15 0610  NA 138 137 140 138 139 138  K 3.3* 3.4* 3.6 3.7 3.6 3.9  CL 103 101 102 102 101 99*  CO2 25 27  --  28 28 28   GLUCOSE 124* 87 85 97 87 83  BUN 48* 42* 40* 41* 40* 40*  CREATININE 2.06* 1.97* 2.00* 2.06* 2.09* 2.19*  CALCIUM 9.0 9.0  --  9.1 9.1 9.4  MG  --  1.8  --   --   --   --    GFR: Estimated Creatinine Clearance: 38 mL/min (by C-G formula based on SCr of 2.19 mg/dL). Liver Function Tests:  Recent Labs Lab 12/05/15 0520 12/06/15 0610   AST 24 25  ALT 12* 12*  ALKPHOS 70 79  BILITOT 1.5* 1.7*  PROT 6.1* 6.6  ALBUMIN 2.7* 2.9*   No results for input(s): LIPASE, AMYLASE in the last 168 hours. No results for input(s): AMMONIA in the last 168 hours. Coagulation Profile: No  results for input(s): INR, PROTIME in the last 168 hours. Cardiac Enzymes: No results for input(s): CKTOTAL, CKMB, CKMBINDEX, TROPONINI in the last 168 hours. BNP (last 3 results) No results for input(s): PROBNP in the last 8760 hours. HbA1C: No results for input(s): HGBA1C in the last 72 hours. CBG:  Recent Labs Lab 12/06/15 0748 12/06/15 1302 12/06/15 1750 12/06/15 2102 12/07/15 0803  GLUCAP 80 121* 92 124* 112*   Lipid Profile: No results for input(s): CHOL, HDL, LDLCALC, TRIG, CHOLHDL, LDLDIRECT in the last 72 hours. Thyroid Function Tests: No results for input(s): TSH, T4TOTAL, FREET4, T3FREE, THYROIDAB in the last 72 hours. Anemia Panel:  Recent Labs  12/07/15 0510  VITAMINB12 1,618*  FOLATE 29.9  FERRITIN 141  TIBC 409  IRON 28  RETICCTPCT 2.1   Urine analysis:    Component Value Date/Time   COLORURINE YELLOW 06/13/2015 0134   APPEARANCEUR CLOUDY (A) 06/13/2015 0134   LABSPEC 1.017 06/13/2015 0134   PHURINE 5.0 06/13/2015 0134   GLUCOSEU NEGATIVE 06/13/2015 0134   HGBUR NEGATIVE 06/13/2015 0134   BILIRUBINUR NEGATIVE 06/13/2015 0134   KETONESUR NEGATIVE 06/13/2015 0134   PROTEINUR 30 (A) 06/13/2015 0134   NITRITE POSITIVE (A) 06/13/2015 0134   LEUKOCYTESUR SMALL (A) 06/13/2015 0134   Sepsis Labs: @LABRCNTIP (procalcitonin:4,lacticidven:4)    Recent Results (from the past 240 hour(s))  MRSA PCR Screening     Status: None   Collection Time: 11/29/15  6:24 AM  Result Value Ref Range Status   MRSA by PCR NEGATIVE NEGATIVE Final      Radiology Studies: Dg Chest 2 View Result Date: 11/28/2015 CLINICAL DATA: No active cardiopulmonary disease. Electronically Signed   By: Marijo Conception, M.D.   On: 11/28/2015  20:10      Scheduled Meds: . allopurinol  100 mg Oral Daily  . atorvastatin  20 mg Oral Daily  . ferumoxytol  510 mg Intravenous Once  . insulin aspart  0-9 Units Subcutaneous TID WC  . loratadine  10 mg Oral Daily  . metolazone  5 mg Oral BID  . metoprolol succinate  25 mg Oral Daily  . mometasone-formoterol  2 puff Inhalation BID  . pantoprazole  40 mg Oral BID  . potassium chloride  40 mEq Oral BID  . sodium chloride flush  10-40 mL Intracatheter Q12H  . torsemide  80 mg Oral BID   Continuous Infusions: . furosemide (LASIX) infusion 20 mg/hr (12/07/15 0827)     LOS: 9 days    Time spent: 15 minutes  Greater than 50% of the time spent on counseling and coordinating the care.   Florencia Reasons, MD PhD [facility] Pager [phone]  If 7PM-7AM, please contact night-coverage [email or web address] Password [facility] 12/07/2015, 8:52 AM

## 2015-12-07 NOTE — Care Management Important Message (Signed)
Important Message  Patient Details  Name: Robyn Porter MRN: [id number] Date of Birth: 03-03-1951   Medicare Important Message Given:  Yes    [name] 12/07/2015, 1:28 PM

## 2015-12-07 NOTE — Progress Notes (Addendum)
Advanced Heart Failure Rounding Note  Primary Cardiologist: Aundra Dubin Reason for Consultation: CHF  Subjective:    Admitted 11/28/15 with anemia.  Hgb 7.8 this am. Colonoscopy 12/03/15 with several lesions that were treated with APC.Echo (11/29/15) with EF 50-55%, mildly dilated RV, moderate AS with mild to moderate AI.   Feeling OK. Less SOB. Eating 5-6 cups of ice a day + fluid intake.    Out 3.7 liters. No weight yesterday, but weight shows down 11 lbs from 2 days ago.   CVP still up at 15-16. Creatinine stable. K 3.5  Objective:   Weight Range: (!) 344 lb 9.6 oz (156.3 kg) Body mass index is 59.15 kg/m.   Vital Signs:   Temp:  [97.7 F (36.5 C)-98.2 F (36.8 C)] 98.2 F (36.8 C) (08/11 0435) Pulse Rate:  [61-70] 66 (08/11 0435) Resp:  [13-23] 15 (08/11 0435) BP: (119-136)/(49-74) 119/57 (08/11 0435) SpO2:  [94 %-100 %] 97 % (08/11 0435) Weight:  [344 lb 9.6 oz (156.3 kg)] 344 lb 9.6 oz (156.3 kg) (08/11 0500) Last BM Date: 12/06/15  Weight change: Filed Weights   12/05/15 0806 12/05/15 1000 12/07/15 0500  Weight: (!) 370 lb (167.8 kg) (!) 355 lb (161 kg) (!) 344 lb 9.6 oz (156.3 kg)    Intake/Output:   Intake/Output Summary (Last 24 hours) at 12/07/15 0804 Last data filed at 12/07/15 0600  Gross per 24 hour  Intake              744 ml  Output             4000 ml  Net            -3256 ml     Physical Exam: CVP 15-16 General: NAD. In chair Neck: Thick, JVP remains elevated to jaw. no thyromegaly or thyroid nodule.  Lungs: Diminished throughout CV: Nondisplaced PMI.  Heart regular S1/S2, no [id number], 3/6 systolic crescendo-decrescendo murmur with obscured S2. Trace  RLE LLE edema.  No carotid bruit.  Normal pedal pulses.  Abdomen: Morbidly obese, soft, NT, ND, no HSM. No bruits or masses. +BS  Skin: Intact without lesions or rashes.  Neurologic: Alert and oriented x 3.  Psych: Normal affect. Extremities: No clubbing or cyanosis.  HEENT: Normal.   Telemetry:  Reviewed personally, Afib 60s   Labs: CBC  Recent Labs  12/06/15 0610 12/07/15 0510  WBC 5.6 5.8  HGB 8.2* 8.4*  HCT 27.9* 28.0*  MCV 95.5 95.9  PLT 154 [id number]   Basic Metabolic Panel  Recent Labs  12/05/15 0520 12/06/15 0610  NA 139 138  K 3.6 3.9  CL 101 99*  CO2 28 28  GLUCOSE 87 83  BUN 40* 40*  CREATININE 2.09* 2.19*  CALCIUM 9.1 9.4   Liver Function Tests  Recent Labs  12/05/15 0520 12/06/15 0610  AST 24 25  ALT 12* 12*  ALKPHOS 70 79  BILITOT 1.5* 1.7*  PROT 6.1* 6.6  ALBUMIN 2.7* 2.9*   No results for input(s): LIPASE, AMYLASE in the last 72 hours. Cardiac Enzymes No results for input(s): CKTOTAL, CKMB, CKMBINDEX, TROPONINI in the last 72 hours.  BNP: BNP (last 3 results)  Recent Labs  06/12/15 2328 07/09/15 1158 07/19/15 1124  BNP 940.9* 133.0* 149.0*    ProBNP (last 3 results) No results for input(s): PROBNP in the last 8760 hours.   D-Dimer No results for input(s): DDIMER in the last 72 hours. Hemoglobin A1C No results for input(s): HGBA1C in the last 72  hours. Fasting Lipid Panel No results for input(s): CHOL, HDL, LDLCALC, TRIG, CHOLHDL, LDLDIRECT in the last 72 hours. Thyroid Function Tests No results for input(s): TSH, T4TOTAL, T3FREE, THYROIDAB in the last 72 hours.  Invalid input(s): FREET3  Other results:     Imaging/Studies:  No results found.  Latest Echo  Latest Cath   Medications:     Scheduled Medications: . allopurinol  100 mg Oral Daily  . atorvastatin  20 mg Oral Daily  . insulin aspart  0-9 Units Subcutaneous TID WC  . loratadine  10 mg Oral Daily  . metoprolol succinate  25 mg Oral Daily  . mometasone-formoterol  2 puff Inhalation BID  . pantoprazole  40 mg Oral BID  . potassium chloride  40 mEq Oral BID  . sodium chloride flush  10-40 mL Intracatheter Q12H    Infusions: . furosemide (LASIX) infusion 12 mg/hr (12/07/15 0600)    PRN Medications: acetaminophen **OR** acetaminophen,  HYDROcodone-acetaminophen, ipratropium-albuterol, ondansetron **OR** ondansetron (ZOFRAN) IV, sodium chloride flush   Assessment/Plan   65 yo with morbid obesity, chronic primarily diastolic CHF (EF [id number]), CKD stage III was admitted with anemia and suspected GI bleeding, also with suspected CHF exacerbation given significant weight gain.   1. GI bleeding/anemia: Heme+.  Has dark stools but also on iron.  Has had periodic hematochezia that she attributes to hemorrhoids.  Has had 2 units PRBCs so far.  EGD on 8/3 with no source of bleeding.  - Capsule endo with blood in very distal ileum/IC valve area.   - Colonoscopy 12/03/15 with several lesions treated with APC.   - Xarelto on hold. On PPI.  To resume Xarelto 12/10/15 per GI.  2. Acute on chronic primarily diastolic CHF: EF 99991111 with mild RV dilation on echo 11/29/15.  PICC Line placed.  -  Volume status stagnant. CVP remains elevated at 15-16. - Increase lasix drip to 20 mg/hr for today and give 5 mg metolazone BID.  - Will push hard today and plan on transition to torsemide 80 mg BID tomorrow - Continue Toprol XL 25 mg daily. - CRT device interrogated, she is BiV pacing appropriately (94% BiV pacing) and EF is higher than prior.  - Need daily weights and strict I/Os.  3. AKI on CKD Stage III: - Renal function stable.  - Marked anemia ( initial Hgb in 6 range at PCP) likely contributed to AKI as well. - She has renal appointment scheduled for 12/12/15 with  Kidney - Dr. Hollie Salk 4. Murmur: Moderate AS with mild/mod AI  on Echo 11/29/15.   5. Bradycardia: MDT CRT-P device functioning appropriately.  6. Atrial fibrillation: Chronic -off Xarelto due to GI bleeding. . - Rate controlled. -GI recommends holding Xarelto until 12/10/15. 7. Anemia - Iron on low side. Will give Feraheme.  8. Immobility- Working with PT.   Increasing diuretics as above. Stressed importance of fluid restriction. Hope to switch to po diuretics in am.  Supp K.    Disposition- Refuses SNF. Agreeable to HF. CM referred to [place] for HHPT/OT/RN.   Length of Stay: [address]  Shirley Friar PA-C  12/07/2015, 8:04 AM  Advanced Heart Failure Team Pager [phone] (M-F; 7a - 4p)  Please contact [facility] for night-coverage after hours (4p -7a ) and weekends on [email or web address]  Patient seen and examined with Robyn Kilts, PA-C. We discussed all aspects of the encounter. I agree with the assessment and plan as stated above.   Good urine output but CVP  and weight not changing much. She is eating > 5 glasses of ice per day. Will increase lasix gtt to 20 and double metolaone x 24 hours. Change to po in am. Long discussion about need for fluid restriction.  AF is rate controlled. GI recommends holding Xarelto until next week. Consider DVT prophylaxis.    Robyn Wilensky,MD 10:06 AM

## 2015-12-07 NOTE — Care Management Important Message (Signed)
Important Message  Patient Details  Name: Robyn Porter MRN: [id number] Date of Birth: 1950/09/24   Medicare Important Message Given:  Yes    Nathen May 12/07/2015, 10:45 AM

## 2015-12-07 NOTE — Progress Notes (Signed)
Occupational Therapy Treatment Patient Details Name: Robyn Porter MRN: [id number] DOB: July 05, 1950 Today's Date: 12/07/2015    History of present illness Robyn Porter is a 65 y.o. female with medical history significant of OHS/OSA, chronic systolic CHF and chronic atrial fibrillation on Xarelto. Found to have: worsening anemia and ascending of fluid overload recent cellulitis Hemoccult positive stools in the setting of chronic melena   OT comments  Pt making slow, but steady progress towards OT goals. Pt required min guard assist for basic transfers and mod assist for ADLs for thoroughness due to body habitus. Pt required 2 rest breaks during ADL completion this session and verbal cues for breathing strategies and energy conservation strategies. Pt plans to d/c home now, will need HHOT at discharge. Will continue to follow acutely to address energy conservation education.   Follow Up Recommendations  Home health OT;Supervision/Assistance - 24 hour    Equipment Recommendations  None recommended by OT    Recommendations for Other Services      Precautions / Restrictions Precautions Precautions: Fall Restrictions Weight Bearing Restrictions: No       Mobility Bed Mobility Overal bed mobility: Needs Assistance Bed Mobility: Supine to Sit Rolling: Min guard         General bed mobility comments: Min guard assist for safety. HOB elevated, use of bedrails  Transfers Overall transfer level: Needs assistance Equipment used: Rolling walker (2 wheeled) Transfers: Sit to/from [facility] Sit to Stand: Min guard Stand pivot transfers: Min guard       General transfer comment: Min guard assist for safety. VCs for breathing strategies and to take breaks    Balance Overall balance assessment: Needs assistance Sitting-balance support: No upper extremity supported;Feet supported Sitting balance-Leahy Scale: Fair     Standing balance support: Bilateral upper  extremity supported;During functional activity Standing balance-Leahy Scale: Poor                     ADL Overall ADL's : Needs assistance/impaired     Grooming: Wash/dry hands;Wash/dry face;Min guard;Standing   Upper Body Bathing: Supervision/ safety;Sitting   Lower Body Bathing: Moderate assistance;Sit to/from stand Lower Body Bathing Details (indicate cue type and reason): to be thorough         Toilet Transfer: Min guard;BSC;RW;Ambulation   Toileting- Clothing Manipulation and Hygiene: Moderate assistance;Sit to/from stand Toileting - Clothing Manipulation Details (indicate cue type and reason): to be thorough     Functional mobility during ADLs: Min guard;Rolling walker General ADL Comments: Pt fatigues quickly and becomes anxious when SOB - requires cues to relax and breathing strategies.      Vision                     Perception     Praxis      Cognition   Behavior During Therapy: WFL for tasks assessed/performed Overall Cognitive Status: Within Functional Limits for tasks assessed                       Extremity/Trunk Assessment               Exercises     Shoulder Instructions       General Comments      Pertinent Vitals/ Pain       Pain Assessment: No/denies pain  Home Living  Prior Functioning/Environment              Frequency Min 2X/week     Progress Toward Goals  OT Goals(current goals can now be found in the care plan section)  Progress towards OT goals: Progressing toward goals  Acute Rehab OT Goals Patient Stated Goal: to be able to go home and go on a vacation she has planned for September OT Goal Formulation: With patient Time For Goal Achievement: 12/13/15 Potential to Achieve Goals: Good ADL Goals Pt Will Perform Grooming: with min guard assist;standing Pt Will Perform Lower Body Dressing: with min guard assist;sit to/from  stand;with adaptive equipment Pt Will Transfer to Toilet: with min guard assist;ambulating;bedside commode Pt Will Perform Toileting - Clothing Manipulation and hygiene: with min guard assist;sit to/from stand Additional ADL Goal #1: Pt will be S for in and OOB with HOB up and use of rail  Plan Discharge plan needs to be updated    Co-evaluation                 End of Session Equipment Utilized During Treatment: Rolling walker   Activity Tolerance Patient tolerated treatment well   Patient Left in chair;with call bell/phone within reach;with family/visitor present   Nurse Communication Mobility status        Time: [id number] OT Time Calculation (min): 44 min  Charges: OT General Charges $OT Visit: 1 Procedure OT Treatments $Self Care/Home Management : 38-52 mins  Redmond Baseman, OTR/L Pager: [phone] 12/07/2015, 3:08 PM

## 2015-12-08 LAB — BASIC METABOLIC PANEL
Anion gap: 12 (ref 5–15)
BUN: 42 mg/dL — AB (ref 6–20)
CALCIUM: 9.2 mg/dL (ref 8.9–10.3)
CO2: 30 mmol/L (ref 22–32)
CREATININE: 2.2 mg/dL — AB (ref 0.44–1.00)
Chloride: 98 mmol/L — ABNORMAL LOW (ref 101–111)
GFR calc Af Amer: 26 mL/min — ABNORMAL LOW (ref >60)
GFR, EST NON AFRICAN AMERICAN: 22 mL/min — AB (ref >60)
GLUCOSE: 80 mg/dL (ref 65–99)
POTASSIUM: 3.4 mmol/L — AB (ref 3.5–5.1)
SODIUM: 140 mmol/L (ref 135–145)

## 2015-12-08 LAB — GLUCOSE, CAPILLARY
GLUCOSE-CAPILLARY: 99 mg/dL (ref 65–99)
Glucose-Capillary: 77 mg/dL (ref 65–99)
Glucose-Capillary: 94 mg/dL (ref 65–99)
Glucose-Capillary: 146 mg/dL — ABNORMAL HIGH (ref 65–99)

## 2015-12-08 LAB — CBC
HCT: 25.1 % — ABNORMAL LOW (ref 36.0–46.0)
Hemoglobin: 7.7 g/dL — ABNORMAL LOW (ref 12.0–15.0)
MCH: 28.8 pg (ref 26.0–34.0)
MCHC: 30.7 g/dL (ref 30.0–36.0)
MCV: 94 fL (ref 78.0–100.0)
PLATELETS: 122 10*3/uL — AB (ref 150–400)
RBC: 2.67 MIL/uL — AB (ref 3.87–5.11)
RDW: 17.6 % — AB (ref 11.5–15.5)
WBC: 4.6 10*3/uL (ref 4.0–10.5)

## 2015-12-08 LAB — MAGNESIUM: MAGNESIUM: 1.6 mg/dL — AB (ref 1.7–2.4)

## 2015-12-08 MED ORDER — POTASSIUM CHLORIDE CRYS ER 20 MEQ PO TBCR
40.0000 meq | EXTENDED_RELEASE_TABLET | Freq: Once | ORAL | Status: AC
  Administered 2015-12-08: 40 meq via ORAL
  Filled 2015-12-08: qty 2

## 2015-12-08 MED ORDER — MAGNESIUM SULFATE 2 GM/50ML IV SOLN
2.0000 g | Freq: Once | INTRAVENOUS | Status: AC
  Administered 2015-12-08: 2 g via INTRAVENOUS
  Filled 2015-12-08: qty 50

## 2015-12-08 NOTE — Progress Notes (Signed)
Advanced Heart Failure Rounding Note  Primary Cardiologist: Aundra Dubin Reason for Consultation: CHF  Subjective:    Admitted 11/28/15 with anemia.  Hgb 7.8 this am. Colonoscopy 12/03/15 with several lesions that were treated with APC.Echo (11/29/15) with EF 50-55%, mildly dilated RV, moderate AS with mild to moderate AI.   Lasix gtt increased to 20 yesterday with metolazone 5 bid. But somehow drip stopped at noon. (apaprently order expired) Now on torsemide 80 bid and metolazone 5 bid. With good diuresis.   Weight down 4 more pounds. Breathing better.  Creatinine stable. K 3.5.  CVP 15  Objective:   Weight Range: (!) 154.5 kg (340 lb 9.8 oz) Body mass index is 58.47 kg/m.   Vital Signs:   Temp:  [97.7 F (36.5 C)-98.9 F (37.2 C)] 98.3 F (36.8 C) (08/12 0335) Pulse Rate:  [63-87] 68 (08/12 0335) Resp:  [12-19] 12 (08/12 0335) BP: (93-130)/(46-58) 130/57 (08/12 0335) SpO2:  [96 %-100 %] 99 % (08/12 0335) Weight:  [154.5 kg (340 lb 9.8 oz)] 154.5 kg (340 lb 9.8 oz) (08/12 0500) Last BM Date: 12/07/15  Weight change: Filed Weights   12/05/15 1000 12/07/15 0500 12/08/15 0500  Weight: (!) 161 kg (355 lb) (!) 156.3 kg (344 lb 9.6 oz) (!) 154.5 kg (340 lb 9.8 oz)    Intake/Output:   Intake/Output Summary (Last 24 hours) at 12/08/15 0704 Last data filed at 12/08/15 0336  Gross per 24 hour  Intake             98.4 ml  Output             3075 ml  Net          -2976.6 ml     Physical Exam: CVP 15-16 General: NAD. In bed Neck: Thick, JVP remains elevated to jaw. no thyromegaly or thyroid nodule.  Lungs: Diminished throughout CV: Nondisplaced PMI.  Heart regular S1/S2, no [id number], 3/6 systolic crescendo-decrescendo murmur with obscured S2. 2+ RLE LLE edema.  No carotid bruit.  Normal pedal pulses.  Abdomen: Morbidly obese, soft, NT, ND, no HSM. No bruits or masses. +BS  Skin: Intact without lesions or rashes.  Neurologic: Alert and oriented x 3.  Psych: Normal  affect. Extremities: No clubbing or cyanosis.  HEENT: Normal.   Telemetry: Reviewed personally, Afib 60s with v pacing  Labs: CBC  Recent Labs  12/07/15 0510 12/08/15 0525  WBC 5.8 4.6  HGB 8.4* 7.7*  HCT 28.0* 25.1*  MCV 95.9 94.0  PLT 156 123XX123*   Basic Metabolic Panel  Recent Labs  12/07/15 0852 12/08/15 0525  NA 137 140  K 3.5 3.4*  CL 95* 98*  CO2 30 30  GLUCOSE 89 80  BUN 40* 42*  CREATININE 2.16* 2.20*  CALCIUM 9.4 9.2  MG  --  1.6*   Liver Function Tests  Recent Labs  12/06/15 0610  AST 25  ALT 12*  ALKPHOS 79  BILITOT 1.7*  PROT 6.6  ALBUMIN 2.9*   No results for input(s): LIPASE, AMYLASE in the last 72 hours. Cardiac Enzymes No results for input(s): CKTOTAL, CKMB, CKMBINDEX, TROPONINI in the last 72 hours.  BNP: BNP (last 3 results)  Recent Labs  06/12/15 2328 07/09/15 1158 07/19/15 1124  BNP 940.9* 133.0* 149.0*    ProBNP (last 3 results) No results for input(s): PROBNP in the last 8760 hours.   D-Dimer No results for input(s): DDIMER in the last 72 hours. Hemoglobin A1C No results for input(s): HGBA1C in the  last 72 hours. Fasting Lipid Panel No results for input(s): CHOL, HDL, LDLCALC, TRIG, CHOLHDL, LDLDIRECT in the last 72 hours. Thyroid Function Tests No results for input(s): TSH, T4TOTAL, T3FREE, THYROIDAB in the last 72 hours.  Invalid input(s): FREET3  Other results:     Imaging/Studies:  No results found.  Latest Echo  Latest Cath   Medications:     Scheduled Medications: . allopurinol  100 mg Oral Daily  . atorvastatin  20 mg Oral Daily  . insulin aspart  0-9 Units Subcutaneous TID WC  . loratadine  10 mg Oral Daily  . metoprolol succinate  25 mg Oral Daily  . mometasone-formoterol  2 puff Inhalation BID  . pantoprazole  40 mg Oral BID  . potassium chloride  40 mEq Oral BID  . sodium chloride flush  10-40 mL Intracatheter Q12H  . torsemide  80 mg Oral BID    Infusions:    PRN  Medications: acetaminophen **OR** acetaminophen, HYDROcodone-acetaminophen, ipratropium-albuterol, ondansetron **OR** ondansetron (ZOFRAN) IV, sodium chloride flush   Assessment/Plan   65 yo with morbid obesity, chronic primarily diastolic CHF (EF [id number]), CKD stage III was admitted with anemia and suspected GI bleeding, also with suspected CHF exacerbation given significant weight gain.   1. GI bleeding/anemia: Heme+.  Has dark stools but also on iron.  Has had periodic hematochezia that she attributes to hemorrhoids.  Has had 2 units PRBCs so far.  EGD on 8/3 with no source of bleeding.  - Capsule endo with blood in very distal ileum/IC valve area.   - Colonoscopy 12/03/15 with several lesions treated with APC.   - Xarelto on hold. On PPI.  To resume Xarelto 12/10/15 per GI.  2. Acute on chronic primarily diastolic CHF: EF 99991111 with mild RV dilation on echo 11/29/15.  PICC Line placed.  - Lasix drip increased to 20 mg/hr and gave 5 mg metolazone BID yesterday but drip expired at noon. Seems to be diuresing well on torsemide 80 bid and metolazone. Will continue. Continue to follow CVPs. Supp K+ - Continue Toprol XL 25 mg daily. - CRT device interrogated, she is BiV pacing appropriately (94% BiV pacing) and EF is higher than prior.  - Need daily weights and strict I/Os. Continue to remind her about need for fluid restriction.  3. AKI on CKD Stage III: - Renal function stable.  - Marked anemia ( initial Hgb in 6 range at PCP) likely contributed to AKI as well. - She has renal appointment scheduled for 12/12/15 with [facility] - Dr. Hollie Salk 4. Murmur: Moderate AS with mild/mod AI  on Echo 11/29/15.   5. Bradycardia: MDT CRT-P device functioning appropriately.  6. Atrial fibrillation: Chronic -off Xarelto due to GI bleeding. . - Rate controlled. -GI recommends holding Xarelto until 12/10/15. 7. Anemia - Iron on low side. Will give Feraheme.  8. Immobility- Working with PT.   Increasing  diuretics as above. Stressed importance of fluid restriction. Hope to switch to po diuretics in am.  Supp K.   Disposition- Refuses SNF. Agreeable to HF. CM referred to [facility] for HHPT/OT/RN.   Length of Stay: 10  Glori Bickers MD 12/08/2015, 7:04 AM  Advanced Heart Failure Team Pager [phone] (M-F; [place])  Please contact [facility] for night-coverage after hours (4p -7a ) and weekends on [email or web address]

## 2015-12-08 NOTE — Progress Notes (Signed)
Patient ID: Robyn Porter, female   DOB: 05/02/1950, 65 y.o.   MRN: [id number]  PROGRESS NOTE    Robyn Porter  [id number] DOB: 11/13/1950 DOA: 11/28/2015  PCP: Michell Heinrich, DO   Brief Narrative:  65 yo with history of OHS/OSA, chronic systolic and diastolic CHF (last 2 D ECHO in 05/2015 showed EF 45%), atrial fibrillation on xarelto. She was admitted in 2/17 to [facility] with massive volume overload. She was aggressively diuresed in the hospital and lost around 70 lbs. She had BRBPR in the hospital, this was thought to be hemorrhoidal bleeding (had colonoscopy). She was subsequently discharged to SNF. She was admitted 09/19/15 - 09/21/15 for upgrade to CRT-P . She was noted to be markedly volume overloaded so HF team took over for diuresis. Overall diuresed 5.5 L, though weights were thought to be innacurate. Discharge weight was 363 lbs. Her weight has gone up about 10 lbs after decreasing her diuretics for worsening creatinine.    She was admitted for low hemoglobin. On admission, hemoglobin was 7.1. She has been transfused 2 U PRBC so far. GI has seen her in consultation. EGD with no acute pathology. Status post colonoscopy on 8/7. Cardiology is following for volume overload.   Assessment & Plan:    Acute blood loss anemia / Acute GI bleed / Anemia of chronic kidney disease - Hemoglobin 7.1 on admission. She also has chronic anemia due to CKD,   - FOBT was positive on this admission and she had dark stools (noted pt also on iron) - Xarelto stopped due to risk of bleed ,  Cards recommends to restart it  - Continue protonix  - EGD done 8/3 but no pathology noted,  - Capsule endoscopy showed some blood in very distal ileum /IC valve area   Colonoscopy 8/ 7 showed  few recently bleeding colonic angiodysplastic lesions. Treated with argon plasma coagulation (APC).  Xarelto on hold. On PPI.  To resume Xarelto 12/10/15 per GI.    CKD stage 4 - Baseline Cr 2.51 about 1 month ago, now  around 2.0, stable for several days on lasix gtt -      Dyslipidemia associated with type 2 DM - Continue atorvastatin 20 mg daily     OSA (obstructive sleep apnea) / Pulmonary hypertension - CPAP at bedtime    Acute on chronic systolic and diastolic CHF    - Last 2 D ECHO in 05/2015 with EF 40-45%, moderate pulmonary hypertension  - in stepdown unit for cvp monitoring, she was treated with  lasix drip/ metolazone.switched to oral diuretics with demadex 80mg  bid. Prior to admit she was on torsemide 60 mg twice daily.  - Continue daily weight and strict intake and output  -CRT device interrogated, she is BiV pacing appropriately (94% BiV pacing) and EF is higher than prior.  - Appreciate cardio following    Chronic atrial fibrillation - CHADS vasc score 5 (HTN, age, gender, CHF, DM) - Off of xarelto due to GI bleed,holding Xarelto until 12/10/15 - Rate controlled with metoprolol 25 mg daily     Essential hypertension - Continue metoprolol 25 mg daily     Morbid obesity due to excess calories with comorbidities (HTN, DM, Dyslipidemia) - Body mass index is 58.47 kg/m. 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 - She is on exenatide at home which could be also for weight loss - A1c is 5.1  - Continue SSI  Hypokalemia/hypomagnesemia: replace k  and mag  DVT prophylaxis: SCD's bilaterally  Code Status: full code  Family Communication: nephew in room Disposition Plan: home once clear by GI, cardio  Vs snf , cont stepdown for CVP monitoring    Consultants:   GI  Cardiology   Procedures:   EGD 11/29/2015 - normal esophagus, stomach, no pathology noted  ECHO 11/29/2015 - EF 50-55%  Capsule endoscopy 8/4  Colonoscopy 8/7  Antimicrobials:   None    Subjective: Sitting in bed, NAD, very pleasant, denies chest pain,  Documented urine output 3liters in the last 24hrs, documented cvp 17, cr 2.2, k3.5, mag  1.6  Objective: Vitals:   12/07/15 1940 12/07/15 2341 12/08/15 0335 12/08/15 0500  BP:  (!) 99/46 (!) 130/57   Pulse:  68 68   Resp:  19 12   Temp:  98 F (36.7 C) 98.3 F (36.8 C)   TempSrc:  Oral Oral   SpO2: 98% 96% 99%   Weight:    (!) 154.5 kg (340 lb 9.8 oz)  Height:        Intake/Output Summary (Last 24 hours) at 12/08/15 [id number] Last data filed at 12/08/15 [id number]  Gross per 24 hour  Intake             86.4 ml  Output             2350 ml  Net          -2263.6 ml   Filed Weights   12/05/15 1000 12/07/15 0500 12/08/15 0500  Weight: (!) 161 kg (355 lb) (!) 156.3 kg (344 lb 9.6 oz) (!) 154.5 kg (340 lb 9.8 oz)    Examination:  General exam: no acute distress , obese Respiratory system: No wheezing, no rhonchi  Cardiovascular system: S1 & S2 heard, paced rhythm, rate controlled , + murmur at left upper sternal border Gastrointestinal system: Obese, non tender, (+) BS Central nervous system: Nonfocal  Extremities: obese, palpable pulses, resolving LE edema (+) Skin: no ulcers or lesions , chronic venous stasis changes bilateral lower extremities Psychiatry: Normal mood and behavior     Data Reviewed: I have personally reviewed following labs and imaging studies  CBC:  Recent Labs Lab 12/04/15 0430 12/05/15 0520 12/06/15 0610 12/07/15 0510 12/08/15 0525  WBC 4.9 4.5 5.6 5.8 4.6  HGB 7.8* 7.9* 8.2* 8.4* 7.7*  HCT 26.0* 26.9* 27.9* 28.0* 25.1*  MCV 96.3 97.8 95.5 95.9 94.0  PLT 147* 129* 154 156 123XX123*   Basic Metabolic Panel:  Recent Labs Lab 12/03/15 0415  12/04/15 0430 12/05/15 0520 12/06/15 0610 12/07/15 0852 12/08/15 0525  NA 137  < > 138 139 138 137 140  K 3.4*  < > 3.7 3.6 3.9 3.5 3.4*  CL 101  < > 102 101 99* 95* 98*  CO2 27  --  28 28 28 30 30   GLUCOSE 87  < > 97 87 83 89 80  BUN 42*  < > 41* 40* 40* 40* 42*  CREATININE 1.97*  < > 2.06* 2.09* 2.19* 2.16* 2.20*  CALCIUM 9.0  --  9.1 9.1 9.4 9.4 9.2  MG 1.8  --   --   --   --   --  1.6*  < > =  values in this interval not displayed. GFR: Estimated Creatinine Clearance: 37.6 mL/min (by C-G formula based on SCr of 2.2 mg/dL). Liver Function Tests:  Recent Labs Lab 12/05/15 0520 12/06/15 0610  AST 24 25  ALT 12* 12*  ALKPHOS 70  79  BILITOT 1.5* 1.7*  PROT 6.1* 6.6  ALBUMIN 2.7* 2.9*   No results for input(s): LIPASE, AMYLASE in the last 168 hours. No results for input(s): AMMONIA in the last 168 hours. Coagulation Profile: No results for input(s): INR, PROTIME in the last 168 hours. Cardiac Enzymes: No results for input(s): CKTOTAL, CKMB, CKMBINDEX, TROPONINI in the last 168 hours. BNP (last 3 results) No results for input(s): PROBNP in the last 8760 hours. HbA1C: No results for input(s): HGBA1C in the last 72 hours. CBG:  Recent Labs Lab 12/06/15 2102 12/07/15 0803 12/07/15 1231 12/07/15 1726 12/07/15 2339  GLUCAP 124* 112* 108* 103* 146*   Lipid Profile: No results for input(s): CHOL, HDL, LDLCALC, TRIG, CHOLHDL, LDLDIRECT in the last 72 hours. Thyroid Function Tests: No results for input(s): TSH, T4TOTAL, FREET4, T3FREE, THYROIDAB in the last 72 hours. Anemia Panel:  Recent Labs  12/07/15 0510  VITAMINB12 1,618*  FOLATE 29.9  FERRITIN 141  TIBC 409  IRON 28  RETICCTPCT 2.1   Urine analysis:    Component Value Date/Time   COLORURINE YELLOW 06/13/2015 0134   APPEARANCEUR CLOUDY (A) 06/13/2015 0134   LABSPEC 1.017 06/13/2015 0134   PHURINE 5.0 06/13/2015 0134   GLUCOSEU NEGATIVE 06/13/2015 0134   HGBUR NEGATIVE 06/13/2015 0134   BILIRUBINUR NEGATIVE 06/13/2015 0134   KETONESUR NEGATIVE 06/13/2015 0134   PROTEINUR 30 (A) 06/13/2015 0134   NITRITE POSITIVE (A) 06/13/2015 0134   LEUKOCYTESUR SMALL (A) 06/13/2015 0134   Sepsis Labs: @LABRCNTIP (procalcitonin:4,lacticidven:4)    Recent Results (from the past 240 hour(s))  MRSA PCR Screening     Status: None   Collection Time: 11/29/15  6:24 AM  Result Value Ref Range Status   MRSA by PCR  NEGATIVE NEGATIVE Final      Radiology Studies: Dg Chest 2 View Result Date: 11/28/2015 CLINICAL DATA: No active cardiopulmonary disease. Electronically Signed   By: Marijo Conception, M.D.   On: 11/28/2015 20:10      Scheduled Meds: . allopurinol  100 mg Oral Daily  . atorvastatin  20 mg Oral Daily  . insulin aspart  0-9 Units Subcutaneous TID WC  . loratadine  10 mg Oral Daily  . magnesium sulfate 1 - 4 g bolus IVPB  2 g Intravenous Once  . metoprolol succinate  25 mg Oral Daily  . mometasone-formoterol  2 puff Inhalation BID  . pantoprazole  40 mg Oral BID  . potassium chloride  40 mEq Oral BID  . potassium chloride  40 mEq Oral Once  . sodium chloride flush  10-40 mL Intracatheter Q12H  . torsemide  80 mg Oral BID   Continuous Infusions:     LOS: 10 days    Time spent: 25 minutes  Greater than 50% of the time spent on counseling and coordinating the care.   Florencia Reasons, MD PhD [facility] Pager [phone]  If 7PM-7AM, please contact night-coverage [email or web address] Password [facility] 12/08/2015, 8:07 AM

## 2015-12-09 LAB — GLUCOSE, CAPILLARY
GLUCOSE-CAPILLARY: 115 mg/dL — AB (ref 65–99)
GLUCOSE-CAPILLARY: 126 mg/dL — AB (ref 65–99)
GLUCOSE-CAPILLARY: 125 mg/dL — AB (ref 65–99)
Glucose-Capillary: 76 mg/dL (ref 65–99)

## 2015-12-09 LAB — CBC
HCT: 29.8 % — ABNORMAL LOW (ref 36.0–46.0)
HEMOGLOBIN: 8.8 g/dL — AB (ref 12.0–15.0)
MCH: 28.2 pg (ref 26.0–34.0)
MCHC: 29.5 g/dL — ABNORMAL LOW (ref 30.0–36.0)
MCV: 95.5 fL (ref 78.0–100.0)
PLATELETS: 146 10*3/uL — AB (ref 150–400)
RBC: 3.12 MIL/uL — AB (ref 3.87–5.11)
RDW: 17.5 % — ABNORMAL HIGH (ref 11.5–15.5)
WBC: 5.8 10*3/uL (ref 4.0–10.5)

## 2015-12-09 LAB — BASIC METABOLIC PANEL
Anion gap: 13 (ref 5–15)
BUN: 46 mg/dL — ABNORMAL HIGH (ref 6–20)
CALCIUM: 9.5 mg/dL (ref 8.9–10.3)
CO2: 31 mmol/L (ref 22–32)
CREATININE: 2.39 mg/dL — AB (ref 0.44–1.00)
Chloride: 96 mmol/L — ABNORMAL LOW (ref 101–111)
GFR, EST AFRICAN AMERICAN: 23 mL/min — AB (ref >60)
GFR, EST NON AFRICAN AMERICAN: 20 mL/min — AB (ref >60)
Glucose, Bld: 100 mg/dL — ABNORMAL HIGH (ref 65–99)
Potassium: 3.6 mmol/L (ref 3.5–5.1)
SODIUM: 140 mmol/L (ref 135–145)

## 2015-12-09 MED ORDER — TORSEMIDE 20 MG PO TABS
80.0000 mg | ORAL_TABLET | Freq: Two times a day (BID) | ORAL | Status: DC
  Administered 2015-12-09 – 2015-12-10 (×3): 80 mg via ORAL
  Filled 2015-12-09 (×3): qty 4

## 2015-12-09 MED ORDER — TORSEMIDE 20 MG PO TABS: 80.0000 mg | ORAL_TABLET | Freq: Two times a day (BID) | ORAL | Status: DC

## 2015-12-09 NOTE — Progress Notes (Signed)
Patient ID: Robyn Porter, female   DOB: 10-13-1950, 65 y.o.   MRN: [id number]     Advanced Heart Failure Rounding Note  Primary Cardiologist: Aundra Dubin Reason for Consultation: CHF  Subjective:    Admitted 11/28/15 with anemia.  Colonoscopy 12/03/15 with several AVMs that were treated with APC. Echo (11/29/15) with EF 50-55%, mildly dilated RV, moderate AS with mild to moderate AI.   CVP 16 today.  She is now on torsemide 80 mg bid.  She diuresed well on this regimen yesterday, weight is down 3 lbs. Creatinine is up a bit to 2.39.    Objective:   Weight Range: (!) 337 lb 4.9 oz (153 kg) Body mass index is 57.9 kg/m.   Vital Signs:   Temp:  [97.4 F (36.3 C)-98.4 F (36.9 C)] 97.5 F (36.4 C) (08/13 1130) Pulse Rate:  [63-80] 64 (08/13 1130) Resp:  [13-21] 18 (08/13 1130) BP: (104-134)/(49-61) 118/49 (08/13 1130) SpO2:  [98 %-100 %] 98 % (08/13 1130) Weight:  [288 lb (130.6 kg)-337 lb 4.9 oz (153 kg)] 337 lb 4.9 oz (153 kg) (08/13 0804) Last BM Date: 12/09/15  Weight change: Filed Weights   12/08/15 0500 12/09/15 0308 12/09/15 0804  Weight: (!) 340 lb 9.8 oz (154.5 kg) 288 lb (130.6 kg) (!) 337 lb 4.9 oz (153 kg)    Intake/Output:   Intake/Output Summary (Last 24 hours) at 12/09/15 1226 Last data filed at 12/09/15 0938  Gross per 24 hour  Intake              620 ml  Output             2525 ml  Net            -1905 ml     Physical Exam: CVP 16 General: NAD. In bed Neck: Thick, JVP remains elevated to jaw. no thyromegaly or thyroid nodule.  Lungs: Diminished throughout CV: Nondisplaced PMI.  Heart regular S1/S2, no [id number], 3/6 systolic crescendo-decrescendo murmur. Chronic edema lower legs bilaterally.   Abdomen: Morbidly obese, soft, NT, ND, no HSM. No bruits or masses. +BS  Skin: Intact without lesions or rashes.  Neurologic: Alert and oriented x 3.  Psych: Normal affect. Extremities: No clubbing or cyanosis.  HEENT: Normal.   Telemetry: Reviewed personally, Afib  60s with v pacing  Labs: CBC  Recent Labs  12/07/15 0510 12/08/15 0525  WBC 5.8 4.6  HGB 8.4* 7.7*  HCT 28.0* 25.1*  MCV 95.9 94.0  PLT 156 123XX123*   Basic Metabolic Panel  Recent Labs  12/08/15 0525 12/09/15 0330  NA 140 140  K 3.4* 3.6  CL 98* 96*  CO2 30 31  GLUCOSE 80 100*  BUN 42* 46*  CREATININE 2.20* 2.39*  CALCIUM 9.2 9.5  MG 1.6*  --    Liver Function Tests No results for input(s): AST, ALT, ALKPHOS, BILITOT, PROT, ALBUMIN in the last 72 hours. No results for input(s): LIPASE, AMYLASE in the last 72 hours. Cardiac Enzymes No results for input(s): CKTOTAL, CKMB, CKMBINDEX, TROPONINI in the last 72 hours.  BNP: BNP (last 3 results)  Recent Labs  06/12/15 2328 07/09/15 1158 07/19/15 1124  BNP 940.9* 133.0* 149.0*    ProBNP (last 3 results) No results for input(s): PROBNP in the last 8760 hours.   D-Dimer No results for input(s): DDIMER in the last 72 hours. Hemoglobin A1C No results for input(s): HGBA1C in the last 72 hours. Fasting Lipid Panel No results for input(s): CHOL, HDL, LDLCALC, TRIG, CHOLHDL,  LDLDIRECT in the last 72 hours. Thyroid Function Tests No results for input(s): TSH, T4TOTAL, T3FREE, THYROIDAB in the last 72 hours.  Invalid input(s): FREET3  Other results:     Imaging/Studies:  No results found.  Latest Echo  Latest Cath   Medications:     Scheduled Medications: . allopurinol  100 mg Oral Daily  . atorvastatin  20 mg Oral Daily  . insulin aspart  0-9 Units Subcutaneous TID WC  . loratadine  10 mg Oral Daily  . metoprolol succinate  25 mg Oral Daily  . mometasone-formoterol  2 puff Inhalation BID  . pantoprazole  40 mg Oral BID  . potassium chloride  40 mEq Oral BID  . sodium chloride flush  10-40 mL Intracatheter Q12H  . torsemide  80 mg Oral BID    Infusions:    PRN Medications: acetaminophen **OR** acetaminophen, HYDROcodone-acetaminophen, ipratropium-albuterol, ondansetron **OR** ondansetron  (ZOFRAN) IV, sodium chloride flush   Assessment/Plan   65 yo with morbid obesity, chronic primarily diastolic CHF (EF [id number]), CKD stage III was admitted with anemia and suspected GI bleeding, also with suspected CHF exacerbation given significant weight gain.   1. GI bleeding/anemia: Heme+.  Had dark stools but also on iron.  Has had periodic hematochezia that she attributes to hemorrhoids.  Has had 2 units PRBCs so far.  EGD on 8/3 with no source of bleeding. Capsule endo with blood in very distal ileum/IC valve area.  Colonoscopy 12/03/15 with several AVMs treated with APC.   - Needs CBC today (not done yet).  - Xarelto on hold. On PPI.  To resume Xarelto 12/10/15 per GI.  2. Acute on chronic primarily diastolic CHF: EF [id number] with mild RV dilation on echo 11/29/15.  PICC Line placed. Currently on torsemide 80 mg bid with good diuresis yesterday and weight down 3 lbs.  CVP remains 16.  Creatinine starting to trend up. - Continue Toprol XL 25 mg daily. - Continue torsemide 80 mg po bid today, will need to follow creatinine closely.  - CRT device interrogated, she is BiV pacing appropriately (94% BiV pacing) and EF is higher than prior.  - Need daily weights and strict I/Os. Continue to remind her about need for fluid restriction.  3. AKI on CKD Stage III:  Creatinine trending up.  - Follow carefully, may limit diuresis.  - She has renal appointment scheduled for 12/12/15 with [facility] - Dr. Hollie Salk 4. Murmur: Moderate AS with mild/mod AI  on Echo 11/29/15.   5. Bradycardia: MDT CRT-P device functioning appropriately.  6. Atrial fibrillation: Chronic.  Off Xarelto due to GI bleeding. Rate controlled. -GI recommends holding Xarelto until 12/10/15. 7. Anemia: Got feraheme.  8. Immobility: Working with PT.   Disposition- Refuses SNF. Agreeable to HF. CM referred to [facility] for HHPT/OT/RN.   Length of Stay: 11  [name] MD 12/09/2015, 12:26 PM  Advanced Heart Failure  Team Pager [phone] (M-F; 7a - 4p)  Please contact [facility] for night-coverage after hours (4p -7a ) and weekends on [email or web address]

## 2015-12-09 NOTE — Progress Notes (Signed)
Home CPAP is in the room and set up per RRT. o2 humidity provided, pt stated that she will use her NIV once she is ready for bed, NIV at bedside. States no help needed, o2 2L inline of the machine

## 2015-12-09 NOTE — Procedures (Signed)
Pt is on home cpap with home settings. 

## 2015-12-09 NOTE — Progress Notes (Signed)
Patient ID: Robyn Porter, female   DOB: Dec 21, 1950, 65 y.o.   MRN: [id number]  PROGRESS NOTE    Robyn Porter  [id number] DOB: 1951/01/30 DOA: 11/28/2015  PCP: Robyn Heinrich, DO   Brief Narrative:  65 yo with history of OHS/OSA, chronic systolic and diastolic CHF (last 2 D ECHO in 05/2015 showed EF 45%), atrial fibrillation on xarelto. She was admitted in 2/17 to [facility] with massive volume overload. She was aggressively diuresed in the hospital and lost around 70 lbs. She had BRBPR in the hospital, this was thought to be hemorrhoidal bleeding (had colonoscopy). She was subsequently discharged to SNF. She was admitted 09/19/15 - 09/21/15 for upgrade to CRT-P . She was noted to be markedly volume overloaded so HF team took over for diuresis. Overall diuresed 5.5 L, though weights were thought to be innacurate. Discharge weight was 363 lbs. Her weight has gone up about 10 lbs after decreasing her diuretics for worsening creatinine.    She was admitted for low hemoglobin. On admission, hemoglobin was 7.1. She has been transfused 2 U PRBC so far. GI has seen her in consultation. EGD with no acute pathology. Status post colonoscopy on 8/7. Cardiology is following for volume overload.   Assessment & Plan:    Acute blood loss anemia / Acute GI bleed / Anemia of chronic kidney disease - Hemoglobin 7.1 on admission. She also has chronic anemia due to CKD,   - FOBT was positive on this admission and she had dark stools (noted pt also on iron) - Xarelto stopped due to risk of bleed ,  Cards recommends to restart it  - Continue protonix  - EGD done 8/3 but no pathology noted,  - Capsule endoscopy showed some blood in very distal ileum /IC valve area   Colonoscopy 8/ 7 showed  few recently bleeding colonic angiodysplastic lesions. Treated with argon plasma coagulation (APC).  Xarelto on hold. On PPI.  To resume Xarelto 12/10/15 per GI. hgb 7.7 on 8/13, repeat cbc in am, patient denies active  bleed, may need another prbc transfusion if hgb remain less than 8    CKD stage 4 - Baseline Cr 2.51 about 1 month ago, now around 2.0, stable for several days on lasix gtt -      Dyslipidemia associated with type 2 DM - Continue atorvastatin 20 mg daily     OSA (obstructive sleep apnea) / Pulmonary hypertension - CPAP at bedtime    Acute on chronic systolic and diastolic CHF    - Last 2 D ECHO in 05/2015 with EF 40-45%, moderate pulmonary hypertension  - in stepdown unit for cvp monitoring, she was treated with  lasix drip/ metolazone.switched to oral diuretics with demadex 80mg  bid. Prior to admit she was on torsemide 60 mg twice daily.  - Continue daily weight and strict intake and output  -CRT device interrogated, she is BiV pacing appropriately (94% BiV pacing) and EF is higher than prior.  - Appreciate cardio following    Chronic atrial fibrillation - CHADS vasc score 5 (HTN, age, gender, CHF, DM) - Off of xarelto due to GI bleed,holding Xarelto until 12/10/15 - Rate controlled with metoprolol 25 mg daily     Essential hypertension - Continue metoprolol 25 mg daily     Morbid obesity due to excess calories with comorbidities (HTN, DM, Dyslipidemia) with osa on cpap at night - Body mass index is 57.9 kg/m. 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 - She is on exenatide at home which could be also for weight loss - A1c is 5.1  - Continue SSI  Hypokalemia/hypomagnesemia: replace k and mag  DVT prophylaxis: SCD's bilaterally  Code Status: full code  Family Communication: nephew in room Disposition Plan: home once clear by GI, cardio  Vs snf , cont stepdown for CVP monitoring    Consultants:   GI  Cardiology   Procedures:   EGD 11/29/2015 - normal esophagus, stomach, no pathology noted  ECHO 11/29/2015 - EF 50-55%  Capsule endoscopy 8/4  Colonoscopy 8/7  Antimicrobials:   None     Subjective:  Sitting in bed, NAD, very pleasant, denies chest pain,  Documented urine output 3.3 liters in the last 24hrs, hgb 7.7, k 3.6, cr 2.39  Objective: Vitals:   12/08/15 2303 12/09/15 0308 12/09/15 0747 12/09/15 0804  BP: (!) 111/55 (!) 124/59 (!) 134/59   Pulse: 63 69 63   Resp: 13 (!) 21 13   Temp: 98.4 F (36.9 C) 98 F (36.7 C) 97.4 F (36.3 C)   TempSrc: Oral Oral Oral   SpO2: 100% 100% 100%   Weight:  130.6 kg (288 lb)  (!) 153 kg (337 lb 4.9 oz)  Height:        Intake/Output Summary (Last 24 hours) at 12/09/15 0810 Last data filed at 12/09/15 0320  Gross per 24 hour  Intake              910 ml  Output             3350 ml  Net            -2440 ml   Filed Weights   12/08/15 0500 12/09/15 0308 12/09/15 0804  Weight: (!) 154.5 kg (340 lb 9.8 oz) 130.6 kg (288 lb) (!) 153 kg (337 lb 4.9 oz)    Examination:  General exam: no acute distress , obese Respiratory system: No wheezing, no rhonchi  Cardiovascular system: S1 & S2 heard, paced rhythm, rate controlled , + murmur at left upper sternal border Gastrointestinal system: Obese, non tender, (+) BS Central nervous system: Nonfocal  Extremities: obese, palpable pulses, resolving LE edema (+) Skin: no ulcers or lesions , chronic venous stasis changes bilateral lower extremities Psychiatry: Normal mood and behavior     Data Reviewed: I have personally reviewed following labs and imaging studies  CBC:  Recent Labs Lab 12/04/15 0430 12/05/15 0520 12/06/15 0610 12/07/15 0510 12/08/15 0525  WBC 4.9 4.5 5.6 5.8 4.6  HGB 7.8* 7.9* 8.2* 8.4* 7.7*  HCT 26.0* 26.9* 27.9* 28.0* 25.1*  MCV 96.3 97.8 95.5 95.9 94.0  PLT 147* 129* 154 156 [id number]*   Basic Metabolic Panel:  Recent Labs Lab 12/03/15 0415  12/05/15 0520 12/06/15 0610 12/07/15 0852 12/08/15 0525 12/09/15 0330  NA 137  < > 139 138 137 140 140  K 3.4*  < > 3.6 3.9 3.5 3.4* 3.6  CL 101  < > 101 99* 95* 98* 96*  CO2 27  < > 28 28 30 30 31    GLUCOSE 87  < > 87 83 89 80 100*  BUN 42*  < > 40* 40* 40* 42* 46*  CREATININE 1.97*  < > 2.09* 2.19* 2.16* 2.20* 2.39*  CALCIUM 9.0  < > 9.1 9.4 9.4 9.2 9.5  MG 1.8  --   --   --   --  1.6*  --   < > = values in this interval  not displayed. GFR: Estimated Creatinine Clearance: 34.4 mL/min (by C-G formula based on SCr of 2.39 mg/dL). Liver Function Tests:  Recent Labs Lab 12/05/15 0520 12/06/15 0610  AST 24 25  ALT 12* 12*  ALKPHOS 70 79  BILITOT 1.5* 1.7*  PROT 6.1* 6.6  ALBUMIN 2.7* 2.9*   No results for input(s): LIPASE, AMYLASE in the last 168 hours. No results for input(s): AMMONIA in the last 168 hours. Coagulation Profile: No results for input(s): INR, PROTIME in the last 168 hours. Cardiac Enzymes: No results for input(s): CKTOTAL, CKMB, CKMBINDEX, TROPONINI in the last 168 hours. BNP (last 3 results) No results for input(s): PROBNP in the last 8760 hours. HbA1C: No results for input(s): HGBA1C in the last 72 hours. CBG:  Recent Labs Lab 12/08/15 0806 12/08/15 1224 12/08/15 1725 12/08/15 2201 12/09/15 0753  GLUCAP 77 94 99 146* 76   Lipid Profile: No results for input(s): CHOL, HDL, LDLCALC, TRIG, CHOLHDL, LDLDIRECT in the last 72 hours. Thyroid Function Tests: No results for input(s): TSH, T4TOTAL, FREET4, T3FREE, THYROIDAB in the last 72 hours. Anemia Panel:  Recent Labs  12/07/15 0510  VITAMINB12 1,618*  FOLATE 29.9  FERRITIN 141  TIBC 409  IRON 28  RETICCTPCT 2.1   Urine analysis:    Component Value Date/Time   COLORURINE YELLOW 06/13/2015 0134   APPEARANCEUR CLOUDY (A) 06/13/2015 0134   LABSPEC 1.017 06/13/2015 0134   PHURINE 5.0 06/13/2015 0134   GLUCOSEU NEGATIVE 06/13/2015 0134   HGBUR NEGATIVE 06/13/2015 0134   BILIRUBINUR NEGATIVE 06/13/2015 0134   KETONESUR NEGATIVE 06/13/2015 0134   PROTEINUR 30 (A) 06/13/2015 0134   NITRITE POSITIVE (A) 06/13/2015 0134   LEUKOCYTESUR SMALL (A) 06/13/2015 0134   Sepsis  Labs: @LABRCNTIP (procalcitonin:4,lacticidven:4)    Recent Results (from the past 240 hour(s))  MRSA PCR Screening     Status: None   Collection Time: 11/29/15  6:24 AM  Result Value Ref Range Status   MRSA by PCR NEGATIVE NEGATIVE Final      Radiology Studies: Dg Chest 2 View Result Date: 11/28/2015 CLINICAL DATA: No active cardiopulmonary disease. Electronically Signed   By: Marijo Conception, M.D.   On: 11/28/2015 20:10      Scheduled Meds: . allopurinol  100 mg Oral Daily  . atorvastatin  20 mg Oral Daily  . insulin aspart  0-9 Units Subcutaneous TID WC  . loratadine  10 mg Oral Daily  . metoprolol succinate  25 mg Oral Daily  . mometasone-formoterol  2 puff Inhalation BID  . pantoprazole  40 mg Oral BID  . potassium chloride  40 mEq Oral BID  . sodium chloride flush  10-40 mL Intracatheter Q12H  . torsemide  80 mg Oral BID   Continuous Infusions:     LOS: 11 days    Time spent: 25 minutes  Greater than 50% of the time spent on counseling and coordinating the care.   Florencia Reasons, MD PhD [facility] Pager [phone]  If 7PM-7AM, please contact night-coverage [email or web address] Password [email or web address] 12/09/2015, 8:10 AM

## 2015-12-10 ENCOUNTER — Encounter: Payer: BLUE CROSS/BLUE SHIELD | Admitting: Cardiology

## 2015-12-10 LAB — CBC
HCT: 30.1 % — ABNORMAL LOW (ref 36.0–46.0)
HEMATOCRIT: 28.6 % — AB (ref 36.0–46.0)
HEMOGLOBIN: 8.4 g/dL — AB (ref 12.0–15.0)
Hemoglobin: 9.7 g/dL — ABNORMAL LOW (ref 12.0–15.0)
MCH: 28 pg (ref 26.0–34.0)
MCH: 28 pg (ref 26.0–34.0)
MCHC: 32.2 g/dL (ref 30.0–36.0)
MCHC: 29.4 g/dL — ABNORMAL LOW (ref 30.0–36.0)
MCV: 87 fL (ref 78.0–100.0)
PLATELETS: 146 10*3/uL — AB (ref 150–400)
Platelets: 149 10*3/uL — ABNORMAL LOW (ref 150–400)
RBC: 3.46 MIL/uL — AB (ref 3.87–5.11)
RBC: 3 MIL/uL — AB (ref 3.87–5.11)
RDW: 16.6 % — AB (ref 11.5–15.5)
RDW: 17.7 % — ABNORMAL HIGH (ref 11.5–15.5)
WBC: 5.9 10*3/uL (ref 4.0–10.5)
WBC: 7 10*3/uL (ref 4.0–10.5)

## 2015-12-10 LAB — BASIC METABOLIC PANEL
ANION GAP: 9 (ref 5–15)
BUN: 48 mg/dL — ABNORMAL HIGH (ref 6–20)
CHLORIDE: 99 mmol/L — AB (ref 101–111)
CO2: 32 mmol/L (ref 22–32)
Calcium: 9.6 mg/dL (ref 8.9–10.3)
Creatinine, Ser: 2.45 mg/dL — ABNORMAL HIGH (ref 0.44–1.00)
GFR calc non Af Amer: 19 mL/min — ABNORMAL LOW (ref >60)
GFR, EST AFRICAN AMERICAN: 23 mL/min — AB (ref >60)
Glucose, Bld: 88 mg/dL (ref 65–99)
POTASSIUM: 3.5 mmol/L (ref 3.5–5.1)
SODIUM: 140 mmol/L (ref 135–145)

## 2015-12-10 LAB — GLUCOSE, CAPILLARY
GLUCOSE-CAPILLARY: 78 mg/dL (ref 65–99)
GLUCOSE-CAPILLARY: 75 mg/dL (ref 65–99)
GLUCOSE-CAPILLARY: 79 mg/dL (ref 65–99)
Glucose-Capillary: 99 mg/dL (ref 65–99)
Glucose-Capillary: 108 mg/dL — ABNORMAL HIGH (ref 65–99)

## 2015-12-10 MED ORDER — METOLAZONE 2.5 MG PO TABS
2.5000 mg | ORAL_TABLET | Freq: Once | ORAL | Status: AC
  Administered 2015-12-10: 2.5 mg via ORAL
  Filled 2015-12-10: qty 1

## 2015-12-10 MED ORDER — RIVAROXABAN 15 MG PO TABS
15.0000 mg | ORAL_TABLET | Freq: Every day | ORAL | Status: DC
  Administered 2015-12-10 – 2015-12-11 (×2): 15 mg via ORAL
  Filled 2015-12-10 (×2): qty 1

## 2015-12-10 MED ORDER — POTASSIUM CHLORIDE CRYS ER 20 MEQ PO TBCR
20.0000 meq | EXTENDED_RELEASE_TABLET | Freq: Once | ORAL | Status: AC
  Administered 2015-12-10: 20 meq via ORAL
  Filled 2015-12-10: qty 1

## 2015-12-10 NOTE — Progress Notes (Signed)
Patient ID: Robyn Porter, female   DOB: 1950-10-12, 65 y.o.   MRN: [id number]  PROGRESS NOTE    Nakeda Middlemiss  [id number] DOB: 18-Jan-1951 DOA: 11/28/2015  PCP: Michell Heinrich, DO   Brief Narrative:  65 yo with history of OHS/OSA, chronic systolic and diastolic CHF (last 2 D ECHO in 05/2015 showed EF 45%), atrial fibrillation on xarelto. She was admitted in 2/17 to [facility] with massive volume overload. She was aggressively diuresed in the hospital and lost around 70 lbs. She had BRBPR in the hospital, this was thought to be hemorrhoidal bleeding (had colonoscopy). She was subsequently discharged to SNF. She was admitted 09/19/15 - 09/21/15 for upgrade to CRT-P . She was noted to be markedly volume overloaded so HF team took over for diuresis. Overall diuresed 5.5 L, though weights were thought to be innacurate. Discharge weight was 363 lbs. Her weight has gone up about 10 lbs after decreasing her diuretics for worsening creatinine.    She was admitted for low hemoglobin. On admission, hemoglobin was 7.1. She has been transfused 2 U PRBC so far. GI has seen her in consultation. EGD with no acute pathology. Status post colonoscopy on 8/7. Cardiology is following for volume overload.   Assessment & Plan:    Acute blood loss anemia / Acute GI bleed / Anemia of chronic kidney disease - Hemoglobin 7.1 on admission. She also has chronic anemia due to CKD,   - FOBT was positive on this admission and she had dark stools (noted pt also on iron) - Xarelto stopped due to risk of bleed ,  Cards recommends to restart it  - Continue protonix  - EGD done 8/3 but no pathology noted,  - Capsule endoscopy showed some blood in very distal ileum /IC valve area   Colonoscopy 8/ 7 showed  few recently bleeding colonic angiodysplastic lesions. Treated with argon plasma coagulation (APC).  Xarelto on hold. On PPI.   Xarelto resumed on 12/10/15 per GI. hgb stable above 8, no active bleed    CKD stage 4 -  Baseline Cr 1.8-1.9 about 1 month ago,  -cr slowing trending up with diuresis, 2.45 on 8/14 She is scheduled to see nephrology on 8/16    Dyslipidemia associated with type 2 DM - Continue atorvastatin 20 mg daily     OSA (obstructive sleep apnea) / Pulmonary hypertension - CPAP at bedtime    Acute on chronic systolic and diastolic CHF    - Last 2 D ECHO in 05/2015 with EF 40-45%, moderate pulmonary hypertension  - in stepdown unit for cvp monitoring, she was treated with  lasix drip/ metolazone.switched to oral diuretics with demadex 80mg  bid. Prior to admit she was on torsemide 60 mg twice daily.  - Continue daily weight and strict intake and output  -CRT device interrogated, she is BiV pacing appropriately (94% BiV pacing) and EF is higher than prior.  - Appreciate cardio following    Chronic atrial fibrillation - CHADS vasc score 5 (HTN, age, gender, CHF, DM) - Off of xarelto due to GI bleed,holding Xarelto until 67/14/17 - Rate controlled with metoprolol 25 mg daily     Essential hypertension - Continue metoprolol 25 mg daily     Morbid obesity due to excess calories with comorbidities (HTN, DM, Dyslipidemia) with osa on cpap at night - Body mass index is 57.48 kg/m. 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 - She is on exenatide at home  which could be also for weight loss - A1c is 5.1  - Continue SSI  Hypokalemia/hypomagnesemia: replace k and mag  DVT prophylaxis: SCD's bilaterally  Code Status: full code  Family Communication: nephew in room Disposition Plan: home likely 8/15 if cr stable and cleared by cardiology   Consultants:   GI  Cardiology   Procedures:   EGD 11/29/2015 - normal esophagus, stomach, no pathology noted  ECHO 11/29/2015 - EF 50-55%  Capsule endoscopy 8/4  Colonoscopy 8/7  Antimicrobials:   None    Subjective:  Sitting in chair, NAD, very pleasant, denies chest pain, report  worked with PT , still DOE Documented urine output 2.2 liters in the last 24hrs, hgb 8.4, k 3.5, cr 2.45, cvp down to 16  Objective: Vitals:   12/10/15 0400 12/10/15 0717 12/10/15 0748 12/10/15 0924  BP: 133/60  (!) 135/59 (!) 126/45  Pulse: 67  69 68  Resp: 18  14   Temp: 97.7 F (36.5 C)  97.5 F (36.4 C)   TempSrc: Oral  Oral   SpO2: 100%  98%   Weight:  (!) 152.9 kg (337 lb) (!) 151.9 kg (334 lb 14.1 oz)   Height:        Intake/Output Summary (Last 24 hours) at 12/10/15 1056 Last data filed at 12/10/15 [id number]  Gross per 24 hour  Intake             1403 ml  Output             2950 ml  Net            -1547 ml   Filed Weights   12/09/15 0804 12/10/15 0717 12/10/15 0748  Weight: (!) 153 kg (337 lb 4.9 oz) (!) 152.9 kg (337 lb) (!) 151.9 kg (334 lb 14.1 oz)    Examination:  General exam: no acute distress , obese Respiratory system: No wheezing, no rhonchi  Cardiovascular system: S1 & S2 heard, paced rhythm, rate controlled , + murmur at left upper sternal border Gastrointestinal system: Obese, non tender, (+) BS Central nervous system: Nonfocal  Extremities: obese, palpable pulses, resolving LE edema (+) Skin: no ulcers or lesions , chronic venous stasis changes bilateral lower extremities Psychiatry: Normal mood and behavior     Data Reviewed: I have personally reviewed following labs and imaging studies  CBC:  Recent Labs Lab 12/07/15 0510 12/08/15 0525 12/09/15 1253 12/10/15 0240 12/10/15 0502  WBC 5.8 4.6 5.8 5.9 7.0  HGB 8.4* 7.7* 8.8* 9.7* 8.4*  HCT 28.0* 25.1* 29.8* 30.1* 28.6*  MCV 95.9 94.0 95.5 87.0 PREVIOUS SAMPLE WAS QR  PLT 156 122* 146* 146* [id number]*   Basic Metabolic Panel:  Recent Labs Lab 12/06/15 0610 12/07/15 0852 12/08/15 0525 12/09/15 0330 12/10/15 0502  NA 138 137 140 140 140  K 3.9 3.5 3.4* 3.6 3.5  CL 99* 95* 98* 96* 99*  CO2 28 30 30 31  32  GLUCOSE 83 89 80 100* 88  BUN 40* 40* 42* 46* 48*  CREATININE 2.19* 2.16* 2.20* 2.39*  2.45*  CALCIUM 9.4 9.4 9.2 9.5 9.6  MG  --   --  1.6*  --   --    GFR: Estimated Creatinine Clearance: 33.4 mL/min (by C-G formula based on SCr of 2.45 mg/dL). Liver Function Tests:  Recent Labs Lab 12/05/15 0520 12/06/15 0610  AST 24 25  ALT 12* 12*  ALKPHOS 70 79  BILITOT 1.5* 1.7*  PROT 6.1* 6.6  ALBUMIN 2.7* 2.9*  No results for input(s): LIPASE, AMYLASE in the last 168 hours. No results for input(s): AMMONIA in the last 168 hours. Coagulation Profile: No results for input(s): INR, PROTIME in the last 168 hours. Cardiac Enzymes: No results for input(s): CKTOTAL, CKMB, CKMBINDEX, TROPONINI in the last 168 hours. BNP (last 3 results) No results for input(s): PROBNP in the last 8760 hours. HbA1C: No results for input(s): HGBA1C in the last 72 hours. CBG:  Recent Labs Lab 12/09/15 1128 12/09/15 1717 12/09/15 2151 12/10/15 0557 12/10/15 0821  GLUCAP 115* 126* 125* 78 75   Lipid Profile: No results for input(s): CHOL, HDL, LDLCALC, TRIG, CHOLHDL, LDLDIRECT in the last 72 hours. Thyroid Function Tests: No results for input(s): TSH, T4TOTAL, FREET4, T3FREE, THYROIDAB in the last 72 hours. Anemia Panel: No results for input(s): VITAMINB12, FOLATE, FERRITIN, TIBC, IRON, RETICCTPCT in the last 72 hours. Urine analysis:    Component Value Date/Time   COLORURINE YELLOW 06/13/2015 0134   APPEARANCEUR CLOUDY (A) 06/13/2015 0134   LABSPEC 1.017 06/13/2015 0134   PHURINE 5.0 06/13/2015 0134   GLUCOSEU NEGATIVE 06/13/2015 0134   HGBUR NEGATIVE 06/13/2015 0134   BILIRUBINUR NEGATIVE 06/13/2015 0134   KETONESUR NEGATIVE 06/13/2015 0134   PROTEINUR 30 (A) 06/13/2015 0134   NITRITE POSITIVE (A) 06/13/2015 0134   LEUKOCYTESUR SMALL (A) 06/13/2015 0134   Sepsis Labs: @LABRCNTIP (procalcitonin:4,lacticidven:4)    Recent Results (from the past 240 hour(s))  MRSA PCR Screening     Status: None   Collection Time: 11/29/15  6:24 AM  Result Value Ref Range Status   MRSA by  PCR NEGATIVE NEGATIVE Final      Radiology Studies: Dg Chest 2 View Result Date: 11/28/2015 CLINICAL DATA: No active cardiopulmonary disease. Electronically Signed   By: Marijo Conception, M.D.   On: 11/28/2015 20:10      Scheduled Meds: . allopurinol  100 mg Oral Daily  . atorvastatin  20 mg Oral Daily  . insulin aspart  0-9 Units Subcutaneous TID WC  . loratadine  10 mg Oral Daily  . metoprolol succinate  25 mg Oral Daily  . mometasone-formoterol  2 puff Inhalation BID  . pantoprazole  40 mg Oral BID  . potassium chloride  20 mEq Oral Once  . potassium chloride  40 mEq Oral BID  . rivaroxaban  15 mg Oral Q supper  . sodium chloride flush  10-40 mL Intracatheter Q12H  . torsemide  80 mg Oral BID   Continuous Infusions:     LOS: 12 days    Time spent: 25 minutes  Greater than 50% of the time spent on counseling and coordinating the care.   Florencia Reasons, MD PhD [facility] Pager [phone]  If 7PM-7AM, please contact night-coverage [email or web address] Password [facility] 12/10/2015, 10:56 AM

## 2015-12-10 NOTE — Progress Notes (Signed)
Physical Therapy Treatment Patient Details Name: Robyn Porter MRN: [id number] DOB: 1951-01-30 Today's Date: 12/10/2015    History of Present Illness Robyn Porter is a 65 y.o. female with medical history significant of OHS/OSA, chronic systolic CHF and chronic atrial fibrillation on Xarelto. Found to have: worsening anemia and ascending of fluid overload recent cellulitis Hemoccult positive stools in the setting of chronic melena    PT Comments    Pt progressing with mobility, ambulated 140' with 2 seated rest breaks today and had ambulated with cardiac rehab earlier in morning. Became light headed at end of walk with mild desaturation (90%) and increased anxiety. PT will continue to follow.   Follow Up Recommendations  Home health PT;Supervision/Assistance - 24 hour     Equipment Recommendations  None recommended by PT    Recommendations for Other Services       Precautions / Restrictions Precautions Precautions: Fall Restrictions Weight Bearing Restrictions: No    Mobility  Bed Mobility               General bed mobility comments: pt received in bari chair  Transfers Overall transfer level: Needs assistance Equipment used: Rolling walker (2 wheeled) (bari rollator) Transfers: Sit to/from [facility] Sit to Stand: +2 safety/equipment;Min guard Stand pivot transfers: Min guard;+2 safety/equipment       General transfer comment: practiced sit to stand from rollator multiple times with turn around into and out of it. Pt demonstrated safety with each transfer. Last transfer rollator to chair, pt was feeling lightheaded so min A +2 given for safety.   Ambulation/Gait Ambulation/Gait assistance: Min assist;+2 safety/equipment Ambulation Distance (Feet): 140 Feet (10', seated, 70', seated, 60' seated rest) Assistive device: Rolling walker (2 wheeled) Gait Pattern/deviations: Wide base of support;Step-through pattern;Decreased weight shift to  left Gait velocity: decreased Gait velocity interpretation: <1.8 ft/sec, indicative of risk for recurrent falls General Gait Details: vc's for erect posture and pursed lip breathing. With increased distance she noted that left knee sometimes gives way unexpectedly and she felt like it was getting weak. At 140', 10' from room, pt became light headed and sat on rollator and pushed back to room for transfer to chair. O2 sats between 87 and 99%. HR stable throughout.    Stairs            Wheelchair Mobility    Modified Rankin (Stroke Patients Only)       Balance Overall balance assessment: Needs assistance Sitting-balance support: No upper extremity supported Sitting balance-Leahy Scale: Fair Sitting balance - Comments: able to reach within BOS when sitting unsupported   Standing balance support: Single extremity supported;During functional activity Standing balance-Leahy Scale: Poor Standing balance comment: needs at least single extremity support at all times.                     Cognition Arousal/Alertness: Awake/alert Behavior During Therapy: WFL for tasks assessed/performed Overall Cognitive Status: Within Functional Limits for tasks assessed                      Exercises      General Comments General comments (skin integrity, edema, etc.): Prior to ambulation, pt had also used BSC in room with min-guard A for that transfer      Pertinent Vitals/Pain Pain Assessment: No/denies pain    Home Living                      Prior Function  PT Goals (current goals can now be found in the care plan section) Acute Rehab PT Goals Patient Stated Goal: to be able to go home and go on a vacation she has planned for September PT Goal Formulation: With patient Time For Goal Achievement: 12/13/15 Potential to Achieve Goals: Good Progress towards PT goals: Progressing toward goals    Frequency  Min 3X/week    PT Plan Current plan  remains appropriate    Co-evaluation PT/OT/SLP Co-Evaluation/Treatment: Yes Reason for Co-Treatment: For patient/therapist safety PT goals addressed during session: Mobility/safety with mobility;Balance;Proper use of DME       End of Session Equipment Utilized During Treatment: Gait belt Activity Tolerance: Patient limited by fatigue Patient left: in chair;with call bell/phone within reach     Time: 1206-1245 PT Time Calculation (min) (ACUTE ONLY): 39 min  Charges:  $Gait Training: 23-37 mins                    G Codes:     Leighton Roach, PT  Acute Rehab Services  [phone]  Leighton Roach 12/10/2015, 1:07 PM

## 2015-12-10 NOTE — Progress Notes (Signed)
CARDIAC REHAB PHASE I   PRE:  Rate/Rhythm: 67 paced  BP:  Supine:   Sitting: 119/88  Standing:    SaO2: 98%RA  MODE:  Ambulation: 150 ft   POST:  Rate/Rhythm: 78 paced  BP:  Supine:   Sitting: 125/59  Standing:    SaO2: 90% and 88%RA hall, 90%RA room [id number] Assisted pt to [facility] and then to weigh on scale prior to walk. Pt walked 150 ft on RA with rolling walker and asst x 1 for equipment and one asst to follow with wheelchair. Pt sat twice to rest. Sats at 88-90%RA during walk. Encouraged pursed lip breathing. Pt tolerated well. Seems to be getting stronger. To recliner after walk.   Graylon Good, RN BSN  12/10/2015 9:10 AM

## 2015-12-10 NOTE — Progress Notes (Signed)
Pharmacy Anticoagulation Note Plan to restart xarelto 8/14 per GI s/p GIB Previously on 20mg  qd prior  last GIB in  2/17 -then dose decreased to15mg  qd at 2/17 DC -dose ok given GIB and Cr improves to 2.4  giving crcl 50 but never stays there so instead of bouncing back and forth 15 qd ok  Bonnita Nasuti Pharm.D. CPP, BCPS Clinical Pharmacist [phone] 12/10/2015 10:48 AM

## 2015-12-10 NOTE — Care Management Important Message (Signed)
Important Message  Patient Details  Name: Robyn Porter MRN: [id number] Date of Birth: 1951/03/26   Medicare Important Message Given:  Yes    Nathen May 12/10/2015, 10:48 AM

## 2015-12-10 NOTE — Progress Notes (Signed)
Patient ID: Robyn Porter, female   DOB: 11/17/1950, 65 y.o.   MRN: [id number]     Advanced Heart Failure Rounding Note  Primary Cardiologist: Aundra Dubin Reason for Consultation: CHF  Subjective:    Admitted 11/28/15 with anemia.  Colonoscopy 12/03/15 with several AVMs that were treated with APC. Echo (11/29/15) with EF 50-55%, mildly dilated RV, moderate AS with mild to moderate AI.   CVP 14-15  Continues to diurese. Again discussed diet and fluid restriction.   Out 1 L and down 3 lbs on torsemide 80 mg BID. Creatinine 2.39 -> 2.45. BUN 46 -> 48.  Objective:   Weight Range: (!) 334 lb 14.1 oz (151.9 kg) Body mass index is 57.48 kg/m.   Vital Signs:   Temp:  [97.5 F (36.4 C)-98.1 F (36.7 C)] 97.5 F (36.4 C) (08/14 0748) Pulse Rate:  [63-69] 68 (08/14 0924) Resp:  [14-18] 14 (08/14 0748) BP: (114-135)/(45-60) 126/45 (08/14 0924) SpO2:  [94 %-100 %] 98 % (08/14 0748) Weight:  [334 lb 14.1 oz (151.9 kg)-337 lb (152.9 kg)] 334 lb 14.1 oz (151.9 kg) (08/14 0748) Last BM Date: 12/09/15  Weight change: Filed Weights   12/09/15 0804 12/10/15 0717 12/10/15 0748  Weight: (!) 337 lb 4.9 oz (153 kg) (!) 337 lb (152.9 kg) (!) 334 lb 14.1 oz (151.9 kg)    Intake/Output:   Intake/Output Summary (Last 24 hours) at 12/10/15 1015 Last data filed at 12/10/15 0928  Gross per 24 hour  Intake             1403 ml  Output             2950 ml  Net            -1547 ml     Physical Exam: CVP 14-15 General: NAD. In bed Neck: Thick, JVP remains elevated to jaw. no thyromegaly or thyroid nodule.  Lungs: Distant. Difficult to tell if diminished or 2/2 to body habitus.  CV: Nondisplaced PMI. Distant heart sounds. Heart regular S1/S2, no [id number], 3/6 systolic crescendo-decrescendo murmur. Chronic BLE edema.    Abdomen: Morbidly obese, soft, NT, ND, no HSM. No bruits or masses. +BS  Skin: Intact without lesions or rashes.  Neurologic: Alert and oriented x 3.  Psych: Normal affect. Extremities: No  clubbing or cyanosis.  HEENT: Normal.   Telemetry: Reviewed, Afib 60s with V pacing.   Labs: CBC  Recent Labs  12/10/15 0240 12/10/15 0502  WBC 5.9 7.0  HGB 9.7* 8.4*  HCT 30.1* 28.6*  MCV 87.0 PREVIOUS SAMPLE WAS QR  PLT 146* 123456*   Basic Metabolic Panel  Recent Labs  12/08/15 0525 12/09/15 0330 12/10/15 0502  NA 140 140 140  K 3.4* 3.6 3.5  CL 98* 96* 99*  CO2 30 31 32  GLUCOSE 80 100* 88  BUN 42* 46* 48*  CREATININE 2.20* 2.39* 2.45*  CALCIUM 9.2 9.5 9.6  MG 1.6*  --   --    Liver Function Tests No results for input(s): AST, ALT, ALKPHOS, BILITOT, PROT, ALBUMIN in the last 72 hours. No results for input(s): LIPASE, AMYLASE in the last 72 hours. Cardiac Enzymes No results for input(s): CKTOTAL, CKMB, CKMBINDEX, TROPONINI in the last 72 hours.  BNP: BNP (last 3 results)  Recent Labs  06/12/15 2328 07/09/15 1158 07/19/15 1124  BNP 940.9* 133.0* 149.0*    ProBNP (last 3 results) No results for input(s): PROBNP in the last 8760 hours.   D-Dimer No results for input(s): DDIMER in the  last 72 hours. Hemoglobin A1C No results for input(s): HGBA1C in the last 72 hours. Fasting Lipid Panel No results for input(s): CHOL, HDL, LDLCALC, TRIG, CHOLHDL, LDLDIRECT in the last 72 hours. Thyroid Function Tests No results for input(s): TSH, T4TOTAL, T3FREE, THYROIDAB in the last 72 hours.  Invalid input(s): FREET3  Other results:     Imaging/Studies:  No results found.  Latest Echo  Latest Cath   Medications:     Scheduled Medications: . allopurinol  100 mg Oral Daily  . atorvastatin  20 mg Oral Daily  . insulin aspart  0-9 Units Subcutaneous TID WC  . loratadine  10 mg Oral Daily  . metoprolol succinate  25 mg Oral Daily  . mometasone-formoterol  2 puff Inhalation BID  . pantoprazole  40 mg Oral BID  . potassium chloride  40 mEq Oral BID  . sodium chloride flush  10-40 mL Intracatheter Q12H  . torsemide  80 mg Oral BID    Infusions:      PRN Medications: acetaminophen **OR** acetaminophen, HYDROcodone-acetaminophen, ipratropium-albuterol, ondansetron **OR** ondansetron (ZOFRAN) IV, sodium chloride flush   Assessment/Plan   65 yo with morbid obesity, chronic primarily diastolic CHF (EF [id number]), CKD stage III was admitted with anemia and suspected GI bleeding, also with suspected CHF exacerbation given significant weight gain.   1. GI bleeding/anemia: Heme+.  Had dark stools but also on iron.  Has had periodic hematochezia that she attributes to hemorrhoids.  Has had 2 units PRBCs so far.  EGD on 8/3 with no source of bleeding. Capsule endo with blood in very distal ileum/IC valve area.  Colonoscopy 12/03/15 with several AVMs treated with APC.   - CBC relatively stable.  - Xarelto on hold. On PPI.  To resume Xarelto today per GI recommendation. Will discuss with MD.  2. Acute on chronic primarily diastolic CHF: EF 99991111 with mild RV dilation on echo 11/29/15.  PICC Line placed.  - Remains on torsemide 80 mg BID.  Creatinine trending up. Will give dose of metolazone with evening dose torsemide and plan for home in am.  - CVP 14-15. - Continue Toprol XL 25 mg daily. - CRT device interrogated, she is BiV pacing appropriately (94% BiV pacing) and EF is higher than prior.  - Need daily weights and strict I/Os. Continue to remind her about need for fluid restriction.  3. AKI on CKD Stage III: - Creatinine relatively stable but elevated.    - Follow carefully, may limit diuresis.  - She has renal appointment scheduled for 12/12/15 with [facility] - Dr. Hollie Salk. May have to reschedule.  4. Murmur: Moderate AS with mild/mod AI  on Echo 11/29/15.   5. Bradycardia: MDT CRT-P device functioning appropriately.  6. Atrial fibrillation: Chronic.  Off Xarelto due to GI bleeding. Rate controlled. - Will discuss timing of Xarelto resumption with MD. GI recommended today,  12/10/15. 7. Anemia: Got feraheme.  8. Immobility: Working with PT.    CVP continues to trend down.  Add metolazone this evening. Hope to send home in am.    Disposition- Refuses SNF. Agreeable to HF. CM referred to [place] for HHPT/OT/RN.   Length of Stay: [place], [place] 12/10/2015, 10:15 AM  Advanced Heart Failure Team Pager [phone] (M-F; 7a - 4p)  Please contact [facility] for night-coverage after hours (4p -7a ) and weekends on [email or web address]  Patient seen with PA, agree with the above note.  Weight is down considerably but still volume  overloaded with CVP in 16 range.  Creatinine rising.  Will give a dose of metolazone with pm torsemide today.  Probably home tomorrow on torsemide 80 mg po bid.  Will need close followup.    No further overt GI bleeding, plan to restart Xarelto at 15 mg daily today.  Had AVMs in distal ileum treated last week.   [name] 12/10/2015 3:22 PM

## 2015-12-10 NOTE — Progress Notes (Signed)
Occupational Therapy Treatment Patient Details Name: Robyn Porter MRN: [id number] DOB: [birth year] Today's Date: 12/10/2015    History of present illness Lasca Waligorski is a 65 y.o. female with medical history significant of OHS/OSA, chronic systolic CHF and chronic atrial fibrillation on Xarelto. Found to have: worsening anemia and ascending of fluid overload recent cellulitis Hemoccult positive stools in the setting of chronic melena   OT comments  Pt making progress toward OT goals this session; continues to be limited by quick fatigue during functional activities. Pt able to perform toilet transfer with min guard assist and peri care with set up in sitting. Pt able to verbally recall energy conservation strategies but required VCs during functional activities for pursed lip breathing when experiencing shortness of breath. D/c plan remains appropriate. Will continue to follow acutely.   Follow Up Recommendations  Home health OT;Supervision/Assistance - 24 hour    Equipment Recommendations  None recommended by OT    Recommendations for Other Services      Precautions / Restrictions Precautions Precautions: Fall Restrictions Weight Bearing Restrictions: No       Mobility Bed Mobility               General bed mobility comments: Pt OOB in chair upon arrival  Transfers Overall transfer level: Needs assistance Equipment used: Rolling walker (2 wheeled) Transfers: Sit to/from Stand;Stand Pivot Transfers Sit to Stand: +2 safety/equipment;Min guard Stand pivot transfers: Min guard;+2 safety/equipment       General transfer comment: practiced sit to stand from rollator multiple times with turn around into and out of it. Pt demonstrated safety with each transfer. Last transfer rollator to chair, pt was feeling lightheaded so min A +2 given for safety.     Balance Overall balance assessment: Needs assistance Sitting-balance support: Feet supported;No upper extremity  supported Sitting balance-Leahy Scale: Fair Sitting balance - Comments: able to reach within BOS when sitting unsupported   Standing balance support: Single extremity supported;During functional activity Standing balance-Leahy Scale: Poor Standing balance comment: needs at least single extremity support at all times.                    ADL Overall ADL's : Needs assistance/impaired     Grooming: Set up;Supervision/safety;Wash/dry hands;Sitting               Lower Body Dressing: Maximal assistance   Toilet Transfer: Min guard;BSC;RW;Ambulation   Toileting- Clothing Manipulation and Hygiene: Set up;Supervision/safety;Sitting/lateral lean Toileting - Clothing Manipulation Details (indicate cue type and reason): for toilet hygiene only     Functional mobility during ADLs: Min guard;Rolling walker General ADL Comments: Continues to fatigue quickly during functional activities. Pt able to verbalize energy conservation strategies but continues to require verbal cues for pursed lip breathing during episodes of SOB. SpO2 down to 88 at one point during functional activity; quickly returned to mid 90s with seated rest break and pursed lip breathing.      Vision                     Perception     Praxis      Cognition   Behavior During Therapy: [facility] for tasks assessed/performed Overall Cognitive Status: Within Functional Limits for tasks assessed                       Extremity/Trunk Assessment               Exercises  Shoulder Instructions       General Comments      Pertinent Vitals/ Pain       Pain Assessment: No/denies pain  Home Living                                          Prior Functioning/Environment              Frequency Min 2X/week     Progress Toward Goals  OT Goals(current goals can now be found in the care plan section)  Progress towards OT goals: Progressing toward goals  Acute Rehab OT  Goals Patient Stated Goal: to be able to go home and go on a vacation she has planned for September OT Goal Formulation: With patient  Plan Discharge plan remains appropriate    Co-evaluation    PT/OT/SLP Co-Evaluation/Treatment: Yes Reason for Co-Treatment: For patient/therapist safety PT goals addressed during session: Mobility/safety with mobility;Balance;Proper use of DME OT goals addressed during session: ADL's and self-care      End of Session Equipment Utilized During Treatment: Gait belt;Rolling walker   Activity Tolerance Patient tolerated treatment well   Patient Left in chair;with call bell/phone within reach   Nurse Communication Mobility status (RN observed mobility)        Time: [id number] OT Time Calculation (min): 36 min  Charges: OT General Charges $OT Visit: 1 Procedure OT Treatments $Self Care/Home Management : 8-22 mins  Binnie Kand M.S., OTR/L Pager: [phone]  12/10/2015, 2:53 PM

## 2015-12-11 DIAGNOSIS — E669 Obesity, unspecified: Secondary | ICD-10-CM

## 2015-12-11 DIAGNOSIS — E119 Type 2 diabetes mellitus without complications: Secondary | ICD-10-CM

## 2015-12-11 LAB — GLUCOSE, CAPILLARY
Glucose-Capillary: 77 mg/dL (ref 65–99)
Glucose-Capillary: 96 mg/dL (ref 65–99)

## 2015-12-11 LAB — CBC
HEMATOCRIT: 27.3 % — AB (ref 36.0–46.0)
Hemoglobin: 8.2 g/dL — ABNORMAL LOW (ref 12.0–15.0)
MCH: 29.1 pg (ref 26.0–34.0)
MCHC: 30 g/dL (ref 30.0–36.0)
MCV: 96.8 fL (ref 78.0–100.0)
Platelets: 138 10*3/uL — ABNORMAL LOW (ref 150–400)
RBC: 2.82 MIL/uL — ABNORMAL LOW (ref 3.87–5.11)
RDW: 17.9 % — AB (ref 11.5–15.5)
WBC: 5.4 10*3/uL (ref 4.0–10.5)

## 2015-12-11 LAB — BASIC METABOLIC PANEL
Anion gap: 11 (ref 5–15)
BUN: 48 mg/dL — AB (ref 6–20)
CHLORIDE: 98 mmol/L — AB (ref 101–111)
CO2: 33 mmol/L — ABNORMAL HIGH (ref 22–32)
CREATININE: 2.58 mg/dL — AB (ref 0.44–1.00)
Calcium: 9.5 mg/dL (ref 8.9–10.3)
GFR calc Af Amer: 21 mL/min — ABNORMAL LOW (ref >60)
GFR, EST NON AFRICAN AMERICAN: 18 mL/min — AB (ref >60)
Glucose, Bld: 83 mg/dL (ref 65–99)
Potassium: 3.7 mmol/L (ref 3.5–5.1)
SODIUM: 142 mmol/L (ref 135–145)

## 2015-12-11 LAB — MAGNESIUM: MAGNESIUM: 1.7 mg/dL (ref 1.7–2.4)

## 2015-12-11 MED ORDER — TORSEMIDE 20 MG PO TABS: 80.0000 mg | ORAL_TABLET | Freq: Two times a day (BID) | ORAL | Status: DC

## 2015-12-11 MED ORDER — METOLAZONE 2.5 MG PO TABS: 2.5000 mg | ORAL_TABLET | ORAL | 3 refills | Status: AC | PRN

## 2015-12-11 MED ORDER — MAGNESIUM SULFATE 50 % IJ SOLN
3.0000 g | Freq: Once | INTRAVENOUS | Status: AC
  Administered 2015-12-11: 3 g via INTRAVENOUS
  Filled 2015-12-11: qty 6

## 2015-12-11 MED ORDER — POTASSIUM CHLORIDE CRYS ER 20 MEQ PO TBCR
40.0000 meq | EXTENDED_RELEASE_TABLET | Freq: Once | ORAL | Status: AC
  Administered 2015-12-11: 40 meq via ORAL
  Filled 2015-12-11: qty 2

## 2015-12-11 MED ORDER — METOPROLOL SUCCINATE ER 25 MG PO TB24: 25.0000 mg | ORAL_TABLET | Freq: Every day | ORAL | 3 refills | Status: DC

## 2015-12-11 MED ORDER — TORSEMIDE 20 MG PO TABS: 80.0000 mg | ORAL_TABLET | Freq: Two times a day (BID) | ORAL | 6 refills | Status: AC

## 2015-12-11 MED ORDER — POTASSIUM CHLORIDE CRYS ER 20 MEQ PO TBCR: 40.0000 meq | EXTENDED_RELEASE_TABLET | Freq: Two times a day (BID) | ORAL | 6 refills | Status: AC

## 2015-12-11 MED ORDER — POTASSIUM CHLORIDE CRYS ER 20 MEQ PO TBCR: 40.0000 meq | EXTENDED_RELEASE_TABLET | Freq: Two times a day (BID) | ORAL | Status: DC

## 2015-12-11 NOTE — Plan of Care (Signed)
Problem: Health Behavior/Discharge Planning: Goal: Ability to manage health-related needs will improve Outcome: Completed/Met Date Met: 12/11/15 Home health setup by unit CM.   Problem: Tissue Perfusion: Goal: Risk factors for ineffective tissue perfusion will decrease Outcome: Completed/Met Date Met: 12/11/15 PO Xarelto.  Problem: Activity: Goal: Risk for activity intolerance will decrease Outcome: Adequate for Discharge Patient has been up to chair frequently.

## 2015-12-11 NOTE — Progress Notes (Signed)
Instructed pt on procedure. HOB less than 45degress, pt held breath upon PICC line removal. Pressure held with no s/sx of bleeding, pressure drsg applied and instructed to remain in bed for 36min.Instructed to leaved drsg CDI for 24 hours. Instructed on monitoring for s/sx of bleeding and reporting. Pt VU. Fran Lowes, RN VAST

## 2015-12-11 NOTE — Care Management Note (Signed)
Case Management Note  Patient Details  Name: Naleyah Bezio MRN: [id number] Date of Birth: July 19, 1950  Subjective/Objective:   Patiennt is for discharge today, NCM notified [facility] that patient is for dc today, per Vaughan Basta at [employer] they will get her back on schedule for them to see for  [facility], [place],                  Action/Plan:   Expected Discharge Date:                  Expected Discharge Plan:  [place]  In-House Referral:  Clinical Social Work  Discharge planning Services  CM Consult  Post Acute Care Choice:    Choice offered to:  Patient  DME Arranged:    DME Agency:     HH Arranged:  RN, PT [place] Agency:  [facility]  Status of Service:  Completed, signed off  If discussed at [place] of Stay Meetings, dates discussed:    Additional Comments:  Zenon Mayo, RN 12/11/2015, 10:24 AM

## 2015-12-11 NOTE — Discharge Summary (Addendum)
Discharge Summary  Robyn Porter [id number] DOB: 1950-09-22  PCP: ADDIS,DANIEL, DO  Admit date: 11/28/2015 Discharge date: 12/11/2015  Time spent: >85mins  Recommendations for Outpatient Follow-up:  1. F/u with PMD within a week  for hospital discharge follow up, repeat cbc/bmp at follow up 2. F/u with cardiology CHF clinic on 8/23 3. F/u with nephrology 4. F/u with gastroenterology   Discharge Diagnoses:  Active Hospital Problems   Diagnosis Date Noted  . Angiodysplasia of cecum   . Acute on chronic diastolic heart failure ([place])   . Dyslipidemia   . Melena 11/28/2015  . GI bleed 11/28/2015  . Chronic systolic CHF (congestive heart failure) ([place]) 07/09/2015  . Acute blood loss anemia   . Chronic anticoagulation   . Chronic atrial fibrillation ([place]) 06/13/2015  . Diabetes mellitus type 2 in obese ([place]) 06/13/2015  . OSA (obstructive sleep apnea) 06/13/2015    Resolved Hospital Problems   Diagnosis Date Noted Date Resolved  No resolved problems to display.    Discharge Condition: stable  Diet recommendation: heart healthy/carb modified  Filed Weights   12/10/15 0717 12/10/15 0748 12/11/15 0325  Weight: (!) 152.9 kg (337 lb) (!) 151.9 kg (334 lb 14.1 oz) (!) 151.5 kg (333 lb 14.4 oz)    History of present illness:   Chief Complaint: fatigue low Hg  HPI: Robyn Porter is a 65 y.o. female with medical history significant of OHS/OSA, chronic systolic CHF and chronic atrial fibrillation on Xarelto    Presented with feeling generally fatigued and lightheaded Endorses some black stools but she is taking iron . She reports ongoing hemorrhoids with occasional drops of blood on the tissue.  Recently patient creatinine has been going up her torsemide has been held intermittently. Throat to nephrology has been set up other patient hasn't seen them yet. Recently has been treated for cellulitis with Keflex. Now she has been having swelling of both legs she's been  bedbound for the past 1-2 weeks secondary to pain in the legs. She had had any shortness of breath or chest pain no lightheadedness that have been having generalized swelling. Her weight increased at least 10 lb Given up of her labs were checked and showed hemoglobin down to 7 which point she was sent to emergency department Regarding pertinent Chronic problems: Patient has history of admission to [facility] for CHF exacerbation requiring 70 pounds diuresis while at the hospital she did develop bright red blood per rectum which was thought to be secondary to hemorrhoidal bleeding diagnosed by colonoscopy echogram at that time showed EF 40-45 percent with mild AS and mild AI. Dry weight is around 353 pounds. Patient has known history of symptomatic bradycardia with pacemaker has been upgraded to CRT-P She has known history of sleep apnea on C Pap Has known history of chronic kidney disease. Baseline creatinine around 2 She has known anemia hemoglobin in March was 7.1  IN ER: Afebrile blood pressure is somewhat soft down to 99/35 currently up to 103/44 Creatinine 2.35 which is down from prior BUN 61 which is around baseline albumin 2.9 WBC 7.0 hemoglobin 7.1 which is down from 7.8 in May chest x-ray unremarkable Hemoccult was positive Provided discuss case with [facility] hold see patient in consult in the morning She was given IV Protonix ordered [facility] was called for admission for worsening anemia and ascending of fluid overload recent cellulitis Hemoccult positive stools in the setting of chronic melena  Hospital Course:  Active Problems:  Chronic atrial fibrillation (HCC)   Diabetes mellitus type 2 in obese (HCC)   OSA (obstructive sleep apnea)   Chronic anticoagulation   Acute blood loss anemia   Chronic systolic CHF (congestive heart failure) (HCC)   Melena   GI bleed   Dyslipidemia   Acute on chronic diastolic heart failure (HCC)   Angiodysplasia of  cecum  Brief Narrative:  65 yo with history of OHS/OSA, chronic systolic and diastolic CHF (last 2 D ECHO in 05/2015 showed EF 45%), atrial fibrillation on xarelto. She was admitted in 2/17 to [facility] with massive volume overload. She was aggressively diuresed in the hospital and lost around 70 lbs. She had BRBPR in the hospital, this was thought to be hemorrhoidal bleeding (had colonoscopy). She was subsequently discharged to SNF. She was admitted 09/19/15 - 09/21/15 for upgrade to CRT-P . She was noted to be markedly volume overloaded so HF team took over for diuresis. Overall diuresed 5.5 L, though weights were thought to be innacurate. Discharge weight was 363 lbs. Her weight has gone up about 10 lbs after decreasing her diuretics for worsening creatinine.   She was admitted for low hemoglobin. On admission, hemoglobin was 7.1. She has been transfused 2 U PRBC so far. GI has seen her in consultation. EGD with no acute pathology. Status post colonoscopy on 8/7. Cardiology is following for volume overload.   Assessment & Plan:    Acute blood loss anemia / Acute GI bleed / Anemia of chronic kidney disease - Hemoglobin 7.1 on admission. She also has chronic anemia due to CKD,   - FOBT was positive on this admission and she had dark stools (noted pt also on iron) - EGD done 8/3 but no pathology noted,  - Capsule endoscopy showed some blood in very distal ileum /IC valve area -  Colonoscopy 8/ 7 showed  few recently bleeding colonic angiodysplastic lesions. Treated with argon plasma coagulation (APC).  Xarelto on hold initially, Xarelto resumed on 12/10/15 per GI.  hgb stable above 8, no active bleed at discharge, continue outpatient GI follow up.    CKD stage 4 - Baseline Cr 1.8-1.9 about 1 month ago,  -cr slowing trending up with diuresis, 2.45 on 8/14, 2.58 on 8/15 She is scheduled to see nephrology on 8/16    Dyslipidemia associated with type 2 DM - Continue atorvastatin 20 mg  daily     OSA (obstructive sleep apnea) / Pulmonary hypertension - CPAP at bedtime    Acute on chronic systolic and diastolic CHF    - Last 2 D ECHO in 05/2015 with EF 40-45%, moderate pulmonary hypertension  - in stepdown unit for cvp monitoring, she was treated with  lasix drip/ metolazone.switched to oral diuretics with demadex 80mg  bid. Prior to admit she was on torsemide 60 mg twice daily.  - Continue daily weight and strict intake and output  -CRT device interrogated, she is BiV pacing appropriately (94% BiV pacing) and EF is higher than prior.  -she diuresed well, she walked with cardiac rehab, sats remain above 88% during walk. - Appreciate cardio following, she is cleared to be discharged home on 8/15 by cardiology     Chronic atrial fibrillation - CHADS vasc score 5 (HTN, age, gender, CHF, DM) - Off of xarelto due to GI bleed, Xarelto resumed onl 12/10/15 - Rate controlled with metoprolol 25 mg daily     Essential hypertension - Continue metoprolol 25 mg daily     Morbid obesity due to excess  calories with comorbidities (HTN, DM, Dyslipidemia) with osa on cpap at night - Body mass index is 57.48 kg/m. 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 - She is on exenatide at home which could be also for weight loss - A1c is 5.1  - SSI in the hospital  Hypokalemia/hypomagnesemia: replace k and mag  DVT prophylaxis while in the hospital: SCD's bilaterally , then xarelto Code Status: full code  Family Communication: patient Disposition Plan: home  8/15 with cardiology clearance   Consultants:   GI  Cardiology   Procedures:   EGD 11/29/2015 - normal esophagus, stomach, no pathology noted  ECHO 11/29/2015 - EF 50-55%  Capsule endoscopy 8/4  Colonoscopy 8/7  Antimicrobials:   None      Discharge Exam: BP (!) 125/57 (BP Location: Left Wrist)   Pulse 67   Temp 98.1 F (36.7 C) (Oral)   Resp  19   Ht 5\' 4"  (1.626 m)   Wt (!) 151.5 kg (333 lb 14.4 oz)   SpO2 94%   BMI 57.31 kg/m    General exam:no acute distress , obese Respiratory system: No wheezing, no rhonchi  Cardiovascular system:S1 &S2 heard, paced rhythm, rate controlled , + murmur at left upper sternal border Gastrointestinal system:Obese, non tender, (+) BS Central nervous system:Nonfocal  Extremities: obese, palpable pulses, resolving LE edema (+) Skin: no ulcers or lesions , chronic venous stasis changes bilateral lower extremities Psychiatry:Normal mood and behavior      Discharge Instructions You were cared for by a hospitalist during your hospital stay. If you have any questions about your discharge medications or the care you received while you were in the hospital after you are discharged, you can call the unit and asked to speak with the hospitalist on call if the hospitalist that took care of you is not available. Once you are discharged, your primary care physician will handle any further medical issues. Please note that NO REFILLS for any discharge medications will be authorized once you are discharged, as it is imperative that you return to your primary care physician (or establish a relationship with a primary care physician if you do not have one) for your aftercare needs so that they can reassess your need for medications and monitor your lab values.  Discharge Instructions    Diet - low sodium heart healthy    Complete by:  As directed   Carb modified   Face-to-face encounter (required for Medicare/Medicaid patients)    Complete by:  As directed   I Roux Brandy certify that this patient is under my care and that I, or a nurse practitioner or physician's assistant working with me, had a face-to-face encounter that meets the physician face-to-face encounter requirements with this patient on 12/11/2015. The encounter with the patient was in whole, or in part for the following medical condition(s) which is  the primary reason for home health care (List medical condition): FTT   The encounter with the patient was in whole, or in part, for the following medical condition, which is the primary reason for home health care:  FTT   I certify that, based on my findings, the following services are medically necessary home health services:   Nursing Physical therapy     Reason for Medically Necessary Home Health Services:  Skilled Nursing- Change/Decline in Patient Status   My clinical findings support the need for the above services:  Shortness of breath with activity  Further, I certify that my clinical findings support that this patient is homebound due to:  Shortness of Breath with activity   Home Health    Complete by:  As directed   To provide the following care/treatments:   PT OT RN Home Health Aide     Increase activity slowly    Complete by:  As directed       Medication List    STOP taking these medications   doxycycline 100 MG capsule Commonly known as:  VIBRAMYCIN     TAKE these medications   acetaminophen 325 MG tablet Commonly known as:  TYLENOL Take 1-2 tablets (325-650 mg total) by mouth every 6 (six) hours as needed for mild pain.   albuterol 108 (90 Base) MCG/ACT inhaler Commonly known as:  PROVENTIL HFA;VENTOLIN HFA Inhale 1 puff into the lungs every 6 (six) hours as needed for wheezing or shortness of breath.   albuterol (2.5 MG/3ML) 0.083% nebulizer solution Commonly known as:  PROVENTIL Take 2.5 mg by nebulization every 8 (eight) hours as needed for wheezing.   allopurinol 100 MG tablet Commonly known as:  ZYLOPRIM Take 100 mg by mouth daily.   atorvastatin 20 MG tablet Commonly known as:  LIPITOR Take 20 mg by mouth daily.   budesonide-formoterol 80-4.5 MCG/ACT inhaler Commonly known as:  SYMBICORT Inhale 2 puffs into the lungs 2 (two) times daily.   BYDUREON 2 MG Pen Generic drug:  Exenatide ER Inject 2 mg into the skin every Saturday. Saturday    cyanocobalamin 500 MCG tablet Take 500 mcg by mouth daily.   ferrous sulfate 325 (65 FE) MG tablet Take 325 mg by mouth 2 (two) times daily with a meal.   ipratropium-albuterol 0.5-2.5 (3) MG/3ML Soln Commonly known as:  DUONEB Inhale 3 mLs into the lungs every 6 (six) hours as needed (for shortness of breath).   loratadine 10 MG tablet Commonly known as:  CLARITIN Take 10 mg by mouth daily.   metolazone 2.5 MG tablet Commonly known as:  ZAROXOLYN Take 1 tablet (2.5 mg total) by mouth as needed (3 lb weight gain overnight or 5lb week gain in a week). As directed by HF clinic. What changed:  additional instructions   metoprolol succinate 25 MG 24 hr tablet Commonly known as:  TOPROL-XL Take 1 tablet (25 mg total) by mouth daily. What changed:  when to take this   multivitamin with minerals Tabs tablet Take 1 tablet by mouth daily. Reported on 07/09/2015   potassium chloride SA 20 MEQ tablet Commonly known as:  K-DUR,KLOR-CON Take 2 tablets (40 mEq total) by mouth 2 (two) times daily. Take an extra 40 meq when you take Metolazone What changed:  how much to take   Rivaroxaban 15 MG Tabs tablet Commonly known as:  XARELTO Take 1 tablet (15 mg total) by mouth daily with supper.   torsemide 20 MG tablet Commonly known as:  DEMADEX Take 4 tablets (80 mg total) by mouth 2 (two) times daily. What changed:  how much to take   vitamin C 500 MG tablet Commonly known as:  ASCORBIC ACID Take 500 mg by mouth daily.   Vitamin D3 5000 units Caps Take 1 capsule by mouth daily.      Allergies  Allergen Reactions  . Adhesive [Tape] Other (See Comments)    Tears skin.  Please use "paper" tape  . Amoxicillin Hives  . Contrast Media [Iodinated Diagnostic Agents] Hives  . Shellfish-Derived Products Hives    Can eat  crab cakes  . Benadryl [Diphenhydramine Hcl] Rash   Follow-up Information    [facility] .   Why:  HHRN, HHPT Contact information: [place] [address] [phone]        [facility] Follow up on 12/19/2015.   Specialty:  Cardiology Why:  at 2 pm for post hospital follow up. Please bring all of your medications to your visit. The code for parking is 0003. Contact information: [address] [id number] [place], MD Follow up in 1 month(s).   Specialty:  Gastroenterology Why:  gi bleed Contact information: [address] [place] [address] [phone]        ADDIS,DANIEL, DO Follow up in 2 week(s).   Specialty:  Family Medicine Why:  hospital discharge follow up Contact information: [place] [address] [phone]        Madelon Lips, MD. Go on 12/12/2015.   Specialty:  Nephrology Why:  ckd Contact information: [place] [address] [phone]            The results of significant diagnostics from this hospitalization (including imaging, microbiology, ancillary and laboratory) are listed below for reference.    Significant Diagnostic Studies: Dg Chest 2 View  Result Date: 11/28/2015 CLINICAL DATA:  Congestion, weakness. EXAM: CHEST  2 VIEW COMPARISON:  Radiographs of Sep 20, 2015. FINDINGS: Mild cardiomegaly is noted. Left-sided pacemaker is unchanged in position. No pneumothorax or pleural effusion is noted. No acute pulmonary disease is noted. Bony thorax is unremarkable. IMPRESSION: No active cardiopulmonary disease. Electronically Signed   By: Marijo Conception, M.D.   On: 11/28/2015 20:10   Dg Chest Port 1 View  Result Date: 11/29/2015 CLINICAL DATA:  Right PICC placement. EXAM: PORTABLE CHEST 1 VIEW COMPARISON:  11/28/2015. FINDINGS: Interval right PICC extending into the inferior aspect of the superior vena cava. The tip is poorly visualized and appears to be within the inferior aspect of the superior vena cava.  Stable enlarged cardiac silhouette and left subclavian pacemaker leads. The pulmonary vasculature and interstitial markings remain mildly prominent. Thoracic spine degenerative changes. IMPRESSION: 1. Right PICC tip in the inferior aspect of the superior vena cava. If a position at the superior cavoatrial junction is desired, it would be recommended that this be advanced 2 cm. 2. Stable cardiomegaly, mild pulmonary vascular congestion and mild chronic interstitial lung disease Electronically Signed   By: Claudie Revering M.D.   On: 11/29/2015 19:59    Microbiology: No results found for this or any previous visit (from the past 240 hour(s)).   Labs: Basic Metabolic Panel:  Recent Labs Lab 12/07/15 0852 12/08/15 0525 12/09/15 0330 12/10/15 0502 12/11/15 0450  NA 137 140 140 140 142  K 3.5 3.4* 3.6 3.5 3.7  CL 95* 98* 96* 99* 98*  CO2 30 30 31  32 33*  GLUCOSE 89 80 100* 88 83  BUN 40* 42* 46* 48* 48*  CREATININE 2.16* 2.20* 2.39* 2.45* 2.58*  CALCIUM 9.4 9.2 9.5 9.6 9.5  MG  --  1.6*  --   --  1.7   Liver Function Tests:  Recent Labs Lab 12/05/15 0520 12/06/15 0610  AST 24 25  ALT 12* 12*  ALKPHOS 70 79  BILITOT 1.5* 1.7*  PROT 6.1* 6.6  ALBUMIN 2.7* 2.9*   No results for input(s): LIPASE,  AMYLASE in the last 168 hours. No results for input(s): AMMONIA in the last 168 hours. CBC:  Recent Labs Lab 12/08/15 0525 12/09/15 1253 12/10/15 0240 12/10/15 0502 12/11/15 0450  WBC 4.6 5.8 5.9 7.0 5.4  HGB 7.7* 8.8* 9.7* 8.4* 8.2*  HCT 25.1* 29.8* 30.1* 28.6* 27.3*  MCV 94.0 95.5 87.0 PREVIOUS SAMPLE WAS QR 96.8  PLT 122* 146* 146* 149* 138*   Cardiac Enzymes: No results for input(s): CKTOTAL, CKMB, CKMBINDEX, TROPONINI in the last 168 hours. BNP: BNP (last 3 results)  Recent Labs  06/12/15 2328 07/09/15 1158 07/19/15 1124  BNP 940.9* 133.0* 149.0*    ProBNP (last 3 results) No results for input(s): PROBNP in the last 8760 hours.  CBG:  Recent Labs Lab  12/10/15 1220 12/10/15 1704 12/10/15 2153 12/11/15 0730 12/11/15 1255  GLUCAP 79 99 108* 77 96       Signed:  Rhapsody Wolven MD, PhD  [facility] 12/11/2015, 4:18 PM

## 2015-12-11 NOTE — Progress Notes (Signed)
CARDIAC REHAB PHASE I   Pt awaiting discharge. Completed CHF education with pt. Reviewed CHF booklet and zone tool, daily weights, sodium and fluid restrictions, heart healthy diet, carb counting and exercise guidelines. Pt has medicare, EF% does not qualify for phase 2 cardiac rehab at this time. Pt verbalized understanding, receptive to education. Pt in recliner, call bell within reach.   [place], RN, BSN 12/11/2015 1:34 PM

## 2015-12-11 NOTE — Progress Notes (Signed)
Patient ID: Robyn Porter, female   DOB: 25-Sep-1950, 65 y.o.   MRN: [id number]     Advanced Heart Failure Rounding Note  Primary Cardiologist: Aundra Dubin Reason for Consultation: CHF  Subjective:    Admitted 11/28/15 with anemia.  Colonoscopy 12/03/15 with several AVMs that were treated with APC. Echo (11/29/15) with EF 50-55%, mildly dilated RV, moderate AS with mild to moderate AI.   CVP 9-10 this am.   Feeling good. Will do her best to keep her Kidney appointment tomorrow.  Knows to watch her fluid intake.   Out 1.2 L and down 1. Creatinine 2.39 -> 2.45 -> 2.58. BUN 46 -> 48 -> 48.   Objective:   Weight Range: (!) 333 lb 14.4 oz (151.5 kg) Body mass index is 57.31 kg/m.   Vital Signs:   Temp:  [96.2 F (35.7 C)-98.4 F (36.9 C)] 98.4 F (36.9 C) (08/15 0807) Pulse Rate:  [60-68] 64 (08/15 0807) Resp:  [12-18] 14 (08/15 0807) BP: (82-126)/(45-65) 116/59 (08/15 0807) SpO2:  [87 %-100 %] 93 % (08/15 0807) Weight:  [333 lb 14.4 oz (151.5 kg)] 333 lb 14.4 oz (151.5 kg) (08/15 0325) Last BM Date: 12/10/15  Weight change: Filed Weights   12/10/15 0717 12/10/15 0748 12/11/15 0325  Weight: (!) 337 lb (152.9 kg) (!) 334 lb 14.1 oz (151.9 kg) (!) 333 lb 14.4 oz (151.5 kg)    Intake/Output:   Intake/Output Summary (Last 24 hours) at 12/11/15 0841 Last data filed at 12/11/15 0500  Gross per 24 hour  Intake             1340 ml  Output             2051 ml  Net             -711 ml     Physical Exam: CVP 9-10 General: NAD. In bed Neck: Thick, JVP difficult to assess with patient size. No thyromegaly or thyroid nodule.  Lungs: Distant. Difficult to tell if diminished or 2/2 to body habitus.  CV: Nondisplaced PMI. Distant heart sounds. Heart regular S1/S2, no [id number], 3/6 systolic crescendo-decrescendo murmur. Chronic BLE edema (Trace - 1 +)   Abdomen: Morbidly obese, soft, non-tender, non-distended, no HSM. No bruits or masses. +BS  Skin: Intact without lesions or rashes.    Neurologic: Alert and oriented x 3.  Psych: Normal affect. Extremities: No clubbing or cyanosis.  HEENT: Normal.   Telemetry: Reviewed personally, A fib 60s with V pacing.    Labs: CBC  Recent Labs  12/10/15 0502 12/11/15 0450  WBC 7.0 5.4  HGB 8.4* 8.2*  HCT 28.6* 27.3*  MCV PREVIOUS SAMPLE WAS QR 96.8  PLT 149* 0000000*   Basic Metabolic Panel  Recent Labs  12/10/15 0502 12/11/15 0450  NA 140 142  K 3.5 3.7  CL 99* 98*  CO2 32 33*  GLUCOSE 88 83  BUN 48* 48*  CREATININE 2.45* 2.58*  CALCIUM 9.6 9.5  MG  --  1.7   Liver Function Tests No results for input(s): AST, ALT, ALKPHOS, BILITOT, PROT, ALBUMIN in the last 72 hours. No results for input(s): LIPASE, AMYLASE in the last 72 hours. Cardiac Enzymes No results for input(s): CKTOTAL, CKMB, CKMBINDEX, TROPONINI in the last 72 hours.  BNP: BNP (last 3 results)  Recent Labs  06/12/15 2328 07/09/15 1158 07/19/15 1124  BNP 940.9* 133.0* 149.0*    ProBNP (last 3 results) No results for input(s): PROBNP in the last 8760 hours.   D-Dimer No  results for input(s): DDIMER in the last 72 hours. Hemoglobin A1C No results for input(s): HGBA1C in the last 72 hours. Fasting Lipid Panel No results for input(s): CHOL, HDL, LDLCALC, TRIG, CHOLHDL, LDLDIRECT in the last 72 hours. Thyroid Function Tests No results for input(s): TSH, T4TOTAL, T3FREE, THYROIDAB in the last 72 hours.  Invalid input(s): FREET3  Other results:     Imaging/Studies:  No results found.  Latest Echo  Latest Cath   Medications:     Scheduled Medications: . allopurinol  100 mg Oral Daily  . atorvastatin  20 mg Oral Daily  . insulin aspart  0-9 Units Subcutaneous TID WC  . loratadine  10 mg Oral Daily  . metoprolol succinate  25 mg Oral Daily  . mometasone-formoterol  2 puff Inhalation BID  . pantoprazole  40 mg Oral BID  . [START ON 12/12/2015] potassium chloride  40 mEq Oral BID  . potassium chloride  40 mEq Oral Once  .  rivaroxaban  15 mg Oral Q supper  . sodium chloride flush  10-40 mL Intracatheter Q12H  . [START ON 12/12/2015] torsemide  80 mg Oral BID    Infusions:    PRN Medications: acetaminophen **OR** acetaminophen, HYDROcodone-acetaminophen, ipratropium-albuterol, ondansetron **OR** ondansetron (ZOFRAN) IV, sodium chloride flush   Assessment/Plan   65 yo with morbid obesity, chronic primarily diastolic CHF (EF [id number]), CKD stage III was admitted with anemia and suspected GI bleeding, also with suspected CHF exacerbation given significant weight gain.   1. GI bleeding/anemia: Heme+.  Had dark stools but also on iron.  Has had periodic hematochezia that she attributes to hemorrhoids.  Has had 2 units PRBCs so far.  EGD on 8/3 with no source of bleeding. Capsule endo with blood in very distal ileum/IC valve area.  Colonoscopy 12/03/15 with several AVMs treated with APC.   - CBC relatively stable.  - Xarelto on hold. On PPI.  To resume Xarelto today per GI recommendation. Will discuss with MD.  2. Acute on chronic primarily diastolic CHF: EF 99991111 with mild RV dilation on echo 11/29/15.  PICC Line placed.  - Hold torsemide today. Resume torsemide 80 mg BID for home tomorrow.   - CVP 9-10 this am, with creatinine rising.  - Continue Toprol XL 25 mg daily. - CRT device interrogated, she is BiV pacing appropriately (94% BiV pacing) and EF is higher than prior.  - Need daily weights and strict I/Os. Continue to remind her about need for fluid restriction.  3. AKI on CKD Stage III: - Creatinine bumped. Holding diuretics today. Resume tomorrow.   - She has renal appointment scheduled for 12/12/15 with [facility] - Dr. Hollie Salk. May have to reschedule.  4. Murmur: Moderate AS with mild/mod AI  on Echo 11/29/15.   5. Bradycardia: MDT CRT-P device functioning appropriately.  6. Atrial fibrillation: Chronic.  Off Xarelto due to GI bleeding. Rate controlled. - Will discuss timing of Xarelto resumption with MD.  GI recommended today,  12/10/15. 7. Anemia: Got feraheme.  8. Immobility: Working with PT.  9. Hypomagnesemia - 1.7. Will top off with IV mag before d/c.   Creatinine bumped. Hold diuretics today. Resume torsemide at 80 mg BID tomorrow at home.  Has close follow up next week. In HF clinic.   Disposition- Refuses SNF. Agreeable to HF. CM referred to [facility] for HHPT/OT/RN.   Has HF follow up at 12/19/15 at 2 pm.    She has been urged to keep her new renal  appointment tomorrow.   HF meds for home Torsemide 80 mg BID STARTING 12/12/15 Potassium 40 meq BID STARTING 12/12/15 Toprol XL 25 mg daily Xarelto 15 mg daily Atorvastatin 20 mg daily Metolazone 2.5 mg AS NEEDED ONLY.  As directed by heart failure clinic.   Length of Stay: [address]  Annamaria Helling 12/11/2015, 8:41 AM  Advanced Heart Failure Team Pager [phone] (M-F; 7a - 4p)  Please contact [facility] for night-coverage after hours (4p -7a ) and weekends on [email or web address]  Patient seen with PA, agree with the above note.  CVP down to 9-10, creatinine higher.  Hold torsemide today, restart tomorrow 80 mg bid.  Needs close followup.    [name] 12/11/2015 1:44 PM

## 2015-12-11 NOTE — Progress Notes (Signed)
Patient to be discharged to home. PICC line removed by IV team nurse. Discharge instructions, follow up appts, and medications reviewed with patient. Patient's family to provide transportation home.  Joellen Jersey, RN.

## 2015-12-12 ENCOUNTER — Other Ambulatory Visit (HOSPITAL_COMMUNITY): Payer: Self-pay | Admitting: Cardiology

## 2015-12-17 ENCOUNTER — Telehealth: Payer: Self-pay | Admitting: Gastroenterology

## 2015-12-17 NOTE — Telephone Encounter (Signed)
Patient was seen originally at [facility] by Dr Henrene Pastor in consultation and for a colonoscopy. She was re-hospitalized earlier this month. Advised to follow up in a month with GI. Appointment made for her with an APP to fit her needs. She relies on family for transportation.

## 2015-12-19 ENCOUNTER — Ambulatory Visit (HOSPITAL_COMMUNITY)
Admission: RE | Admit: 2015-12-19 | Discharge: 2015-12-19 | Disposition: A | Discharge status: Home / Self Care | Payer: Medicare Other | Source: Ambulatory Visit | Attending: Internal Medicine | Admitting: Internal Medicine

## 2015-12-19 VITALS — BP 108/56 | HR 76 | Wt 328.4 lb

## 2015-12-19 DIAGNOSIS — N183 Chronic kidney disease, stage 3 (moderate): Secondary | ICD-10-CM | POA: Insufficient documentation

## 2015-12-19 DIAGNOSIS — R001 Bradycardia, unspecified: Secondary | ICD-10-CM | POA: Diagnosis not present

## 2015-12-19 DIAGNOSIS — I352 Nonrheumatic aortic (valve) stenosis with insufficiency: Secondary | ICD-10-CM | POA: Diagnosis not present

## 2015-12-19 DIAGNOSIS — E669 Obesity, unspecified: Secondary | ICD-10-CM

## 2015-12-19 DIAGNOSIS — I5022 Chronic systolic (congestive) heart failure: Secondary | ICD-10-CM | POA: Diagnosis not present

## 2015-12-19 DIAGNOSIS — E119 Type 2 diabetes mellitus without complications: Secondary | ICD-10-CM | POA: Diagnosis not present

## 2015-12-19 DIAGNOSIS — Z7951 Long term (current) use of inhaled steroids: Secondary | ICD-10-CM | POA: Diagnosis not present

## 2015-12-19 DIAGNOSIS — Z6841 Body Mass Index (BMI) 40.0 and over, adult: Secondary | ICD-10-CM | POA: Diagnosis not present

## 2015-12-19 DIAGNOSIS — Z8679 Personal history of other diseases of the circulatory system: Secondary | ICD-10-CM | POA: Diagnosis not present

## 2015-12-19 DIAGNOSIS — Z7901 Long term (current) use of anticoagulants: Secondary | ICD-10-CM | POA: Insufficient documentation

## 2015-12-19 DIAGNOSIS — E1169 Type 2 diabetes mellitus with other specified complication: Secondary | ICD-10-CM

## 2015-12-19 DIAGNOSIS — E662 Morbid (severe) obesity with alveolar hypoventilation: Secondary | ICD-10-CM | POA: Insufficient documentation

## 2015-12-19 DIAGNOSIS — Z79899 Other long term (current) drug therapy: Secondary | ICD-10-CM | POA: Diagnosis not present

## 2015-12-19 DIAGNOSIS — I878 Other specified disorders of veins: Secondary | ICD-10-CM | POA: Diagnosis not present

## 2015-12-19 DIAGNOSIS — I482 Chronic atrial fibrillation, unspecified: Secondary | ICD-10-CM

## 2015-12-19 DIAGNOSIS — J45909 Unspecified asthma, uncomplicated: Secondary | ICD-10-CM | POA: Diagnosis not present

## 2015-12-19 DIAGNOSIS — Z950 Presence of cardiac pacemaker: Secondary | ICD-10-CM | POA: Insufficient documentation

## 2015-12-19 DIAGNOSIS — G4733 Obstructive sleep apnea (adult) (pediatric): Secondary | ICD-10-CM

## 2015-12-19 DIAGNOSIS — D649 Anemia, unspecified: Secondary | ICD-10-CM | POA: Insufficient documentation

## 2015-12-19 NOTE — Progress Notes (Signed)
Patient ID: Robyn Porter, female   DOB: 11-24-1950, 65 y.o.   MRN: [id number]    Advanced Heart Failure Clinic Note   PCP: Dr. Marveen Reeks [facility]) Pulmonary: Dr Donovan Kail  Cardiology: Dr. Aundra Dubin  65 yo with history of OHS/OSA, chronic systolic CHF and chronic atrial fibrillation presents for CHF clinic followup.  She was admitted in 2/17 to [facility] with massive volume overload.  She was aggressively diuresed in the hospital and lost around 70 lbs.  She had BRBPR in the hospital, this was thought to be hemorrhoidal bleeding (had colonoscopy).  Echo in 2/17 showed EF 40-45% with mild AS and mild AI.  She was discharged to SNF.  Admitted 09/19/15 - 09/21/15 for upgrade to CRT-P . She was noted to be markedly volume overloaded so HF team took over for diuresis.  Overall diuresed 5.5 L, though weights were thought to be innacurate. (showed up 9 lbs despite good diuresis). Discharge weight 363 lbs.    Discharge weight 333.  She presents today for post hospital follow up. Weight at home down to 325, and down 5 lbs by our scales from discharge weight. She feels like she's getting better at getting round. Occasional lightheadedness with prolonged walking. Awaiting PT to start at home. Walking more at home, but still using WC and scooter often. Wearing CPAP nightly. Mild hemorrhoidal bleeding yesterday, but no frank BRBPR. Stools black from Iron.   Labs (3/17): K 3.5, creatinine 2.26 => 2.03, HCT 25, BNP 133 Labs (07/19/15) K 3.8, creatinine 1.94, BNP 149.0, Hgb 7.1 Labs (5/17): K 3.8, creatinine 2.27 Labs (12/11/15): K 3.7, creatinine 2.58  PMH: 1. Asthma 2. OHS/OSA: CPAP, home oxygen. 3. CHF: Chronic systolic CHF.   - Echo ([id number]) with EF 40-45%, mild aortic stenosis, moderate aortic insufficiency.  - Medtronic CRT upgrade 5/17.  4. Aortic valve disorder: Mild aortic stenosis, moderate aortic insufficiency on 2/17 echo. 5. Chronic atrial fibrillation. 6. CKD stage III 7. Lower GI bleeding in  2/17, likely hemorrhoidal.   8. 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     SH: Lives in [place] with nephew.  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 Nonsmoker, no ETOH.   FH: No premature CAD.  ROS: All systems reviewed and negative except as per HPI.   Current Outpatient Prescriptions  Medication Sig Dispense Refill  . acetaminophen (TYLENOL) 325 MG tablet Take 1-2 tablets (325-650 mg total) by mouth every 6 (six) hours as needed for mild pain.    Marland Kitchen albuterol (PROVENTIL HFA;VENTOLIN HFA) 108 (90 Base) MCG/ACT inhaler Inhale 1 puff into the lungs every 6 (six) hours as needed for wheezing or shortness of breath.    Marland Kitchen albuterol (PROVENTIL) (2.5 MG/3ML) 0.083% nebulizer solution Take 2.5 mg by nebulization every 8 (eight) hours as needed for wheezing.   0  . allopurinol (ZYLOPRIM) 100 MG tablet Take 100 mg by mouth daily.    Marland Kitchen atorvastatin (LIPITOR) 20 MG tablet Take 20 mg by mouth daily.    . budesonide-formoterol (SYMBICORT) 80-4.5 MCG/ACT inhaler Inhale 2 puffs into the lungs 2 (two) times daily.    . Cholecalciferol (VITAMIN D3) 5000 units CAPS Take 1 capsule by mouth daily.    . cyanocobalamin 500 MCG tablet Take 500 mcg by mouth daily.    . Exenatide ER (BYDUREON) 2 MG PEN Inject 2 mg into the skin every Saturday. Saturday     . ferrous sulfate 325 (65 FE) MG tablet Take 325 mg by  mouth 2 (two) times daily with a meal.     . ipratropium-albuterol (DUONEB) 0.5-2.5 (3) MG/3ML SOLN Inhale 3 mLs into the lungs every 6 (six) hours as needed (for shortness of breath).   5  . loratadine (CLARITIN) 10 MG tablet Take 10 mg by mouth daily.    . metolazone (ZAROXOLYN) 2.5 MG tablet Take 1 tablet (2.5 mg total) by mouth as needed (3 lb weight gain overnight or 5lb week gain in a week). As directed by HF clinic. 10 tablet 3  . metoprolol succinate (TOPROL-XL) 25 MG 24 hr tablet Take 1 tablet (25 mg total) by mouth daily. 60 tablet  3  . Multiple Vitamin (MULTIVITAMIN WITH MINERALS) TABS tablet Take 1 tablet by mouth daily. Reported on 07/09/2015    . potassium chloride SA (K-DUR,KLOR-CON) 20 MEQ tablet Take 2 tablets (40 mEq total) by mouth 2 (two) times daily. Take an extra 40 meq when you take Metolazone 130 tablet 6  . Rivaroxaban (XARELTO) 15 MG TABS tablet Take 1 tablet (15 mg total) by mouth daily with supper. 30 tablet 6  . torsemide (DEMADEX) 20 MG tablet Take 4 tablets (80 mg total) by mouth 2 (two) times daily. 240 tablet 6  . vitamin C (ASCORBIC ACID) 500 MG tablet Take 500 mg by mouth daily.     No current facility-administered medications for this encounter.    BP (!) 108/56 (BP Location: Left Wrist, Patient Position: Sitting, Cuff Size: Large)   Pulse 76   Wt (!) 328 lb 6.4 oz (149 kg)   SpO2 97%   BMI 56.37 kg/m    Wt Readings from Last 3 Encounters:  12/19/15 (!) 328 lb 6.4 oz (149 kg)  12/11/15 (!) 333 lb 14.4 oz (151.5 kg)  10/25/15 (!) 355 lb (161 kg)     General: NAD, obese.  Neck: JVP difficult to assess due to patient size, appears ~7-8 cm. No thyromegaly or thyroid nodule.  Lungs: Clear, normal effort CV: Nondisplaced PMI.  Heart irregular S1/S2, no S3/S4, 2/6 SEM RUSB with clear S2. 1+ edema to 1/2 to knee with chronic venous stasis changes, L>R.  No carotid bruit.  Normal pedal pulses. CRT-P site stable. Abdomen: Morbidly Obese, soft, NT, ND, no HSM. No bruits or masses. +BS  Skin: Intact without lesions or rashes.  Neurologic: Alert and oriented x 3.  Psych: Normal affect. Extremities: No clubbing or cyanosis.  HEENT: Normal.   Assessment/Plan: 1. Chronic systolic CHF: Suspect nonischemic.  EF 40-45% on 2/17 echo. Recent upgrade to Medtronic CRT given constant RV pacing.  - Volume status stable after recent hospitalization.  - NYHA Class III symptoms.  - Continue torsemide 80 BID and KCl 40 bid.  - Continue metolazone 2.5 mg as needed with extra 40 meq of potassium on those  days. - ContinueToprol XL 25 mg bid. No ACEIARB with elevated creatinine.  - No room to up-titrate with soft BP 2. Symptomatic bradycardia: MDT PPM.  Upgraded to CRT given high percentage RV pacing.  3. CKD: Stage III. - BMET next week from PCP.  - She has now established with Dr. Hollie Salk in Nephrology 4. Chronic atrial fibrillation: She continues on Toprol XL and Xarelto. No further BRBPR.  5. OHS/OSA: Continue CPAP at night and oxygen as needed during the day.  She will need pulmonary followup.  6. Anemia: Per PCP/GI. 7. Chronic venous stasis - With significant bilateral venous stasis changes. 8. Morbid obesity - Starts PT this vs next week. Encouraged to  increase activity as able and to continue limiting portions.  9. Disability - Currently out of work.   She is doing great after recent hospitalization.  No med changes today. She had BMET by renal last week and scheduled with PCP for next weeks, so will have them draw and send to [place].   Follow up 4 weeks. Pt knows to call with weight gain of 3 lbs overnight or 5 lbs within one week.   Satira Mccallum Janin Kozlowski PA-C 12/19/2015

## 2015-12-19 NOTE — Patient Instructions (Signed)
Follow up 4 weeks with Oda Kilts PA-C.  Have your PCP recheck labs (bmet) and fax results to [phone].  Do the following things EVERYDAY: 1) Weigh yourself in the morning before breakfast. Write it down and keep it in a log. 2) Take your medicines as prescribed 3) Eat low salt foods-Limit salt (sodium) to 2000 mg per day.  4) Stay as active as you can everyday 5) Limit all fluids for the day to less than 2 liters

## 2015-12-27 ENCOUNTER — Encounter (HOSPITAL_COMMUNITY): Payer: Self-pay | Admitting: Emergency Medicine

## 2015-12-27 ENCOUNTER — Observation Stay (HOSPITAL_COMMUNITY)
Admission: EM | Admit: 2015-12-27 | Discharge: 2015-12-29 | Disposition: A | Discharge status: Home Health | Payer: Medicare Other | Attending: Internal Medicine | Admitting: Internal Medicine

## 2015-12-27 DIAGNOSIS — R001 Bradycardia, unspecified: Secondary | ICD-10-CM | POA: Insufficient documentation

## 2015-12-27 DIAGNOSIS — I5022 Chronic systolic (congestive) heart failure: Secondary | ICD-10-CM | POA: Diagnosis not present

## 2015-12-27 DIAGNOSIS — M109 Gout, unspecified: Secondary | ICD-10-CM | POA: Diagnosis not present

## 2015-12-27 DIAGNOSIS — Z7901 Long term (current) use of anticoagulants: Secondary | ICD-10-CM | POA: Insufficient documentation

## 2015-12-27 DIAGNOSIS — E785 Hyperlipidemia, unspecified: Secondary | ICD-10-CM | POA: Diagnosis not present

## 2015-12-27 DIAGNOSIS — I429 Cardiomyopathy, unspecified: Secondary | ICD-10-CM | POA: Diagnosis not present

## 2015-12-27 DIAGNOSIS — N184 Chronic kidney disease, stage 4 (severe): Secondary | ICD-10-CM | POA: Diagnosis not present

## 2015-12-27 DIAGNOSIS — J449 Chronic obstructive pulmonary disease, unspecified: Secondary | ICD-10-CM | POA: Diagnosis not present

## 2015-12-27 DIAGNOSIS — E559 Vitamin D deficiency, unspecified: Secondary | ICD-10-CM | POA: Insufficient documentation

## 2015-12-27 DIAGNOSIS — E662 Morbid (severe) obesity with alveolar hypoventilation: Secondary | ICD-10-CM | POA: Insufficient documentation

## 2015-12-27 DIAGNOSIS — Z950 Presence of cardiac pacemaker: Secondary | ICD-10-CM | POA: Diagnosis not present

## 2015-12-27 DIAGNOSIS — D500 Iron deficiency anemia secondary to blood loss (chronic): Secondary | ICD-10-CM | POA: Diagnosis not present

## 2015-12-27 DIAGNOSIS — E669 Obesity, unspecified: Secondary | ICD-10-CM

## 2015-12-27 DIAGNOSIS — Z79899 Other long term (current) drug therapy: Secondary | ICD-10-CM | POA: Diagnosis not present

## 2015-12-27 DIAGNOSIS — K5520 Angiodysplasia of colon without hemorrhage: Secondary | ICD-10-CM | POA: Diagnosis present

## 2015-12-27 DIAGNOSIS — D649 Anemia, unspecified: Secondary | ICD-10-CM

## 2015-12-27 DIAGNOSIS — I10 Essential (primary) hypertension: Secondary | ICD-10-CM | POA: Diagnosis present

## 2015-12-27 DIAGNOSIS — D638 Anemia in other chronic diseases classified elsewhere: Secondary | ICD-10-CM | POA: Diagnosis present

## 2015-12-27 DIAGNOSIS — Z7951 Long term (current) use of inhaled steroids: Secondary | ICD-10-CM | POA: Insufficient documentation

## 2015-12-27 DIAGNOSIS — Z6841 Body Mass Index (BMI) 40.0 and over, adult: Secondary | ICD-10-CM | POA: Diagnosis not present

## 2015-12-27 DIAGNOSIS — I130 Hypertensive heart and chronic kidney disease with heart failure and stage 1 through stage 4 chronic kidney disease, or unspecified chronic kidney disease: Secondary | ICD-10-CM | POA: Insufficient documentation

## 2015-12-27 DIAGNOSIS — I878 Other specified disorders of veins: Secondary | ICD-10-CM | POA: Diagnosis not present

## 2015-12-27 DIAGNOSIS — I482 Chronic atrial fibrillation, unspecified: Secondary | ICD-10-CM | POA: Diagnosis present

## 2015-12-27 DIAGNOSIS — E1122 Type 2 diabetes mellitus with diabetic chronic kidney disease: Secondary | ICD-10-CM | POA: Diagnosis not present

## 2015-12-27 DIAGNOSIS — E1169 Type 2 diabetes mellitus with other specified complication: Secondary | ICD-10-CM

## 2015-12-27 HISTORY — DX: Chronic kidney disease, unspecified: N18.9

## 2015-12-27 HISTORY — DX: Nonrheumatic aortic (valve) stenosis: I35.0

## 2015-12-27 LAB — COMPREHENSIVE METABOLIC PANEL WITH GFR
ALT: 13 U/L — ABNORMAL LOW (ref 14–54)
AST: 31 U/L (ref 15–41)
Albumin: 3 g/dL — ABNORMAL LOW (ref 3.5–5.0)
Alkaline Phosphatase: 81 U/L (ref 38–126)
Anion gap: 11 (ref 5–15)
BUN: 62 mg/dL — ABNORMAL HIGH (ref 6–20)
CO2: 23 mmol/L (ref 22–32)
Calcium: 9.4 mg/dL (ref 8.9–10.3)
Chloride: 103 mmol/L (ref 101–111)
Creatinine, Ser: 2.38 mg/dL — ABNORMAL HIGH (ref 0.44–1.00)
GFR calc Af Amer: 23 mL/min — ABNORMAL LOW
GFR calc non Af Amer: 20 mL/min — ABNORMAL LOW
Glucose, Bld: 145 mg/dL — ABNORMAL HIGH (ref 65–99)
Potassium: 3.8 mmol/L (ref 3.5–5.1)
Sodium: 137 mmol/L (ref 135–145)
Total Bilirubin: 1.8 mg/dL — ABNORMAL HIGH (ref 0.3–1.2)
Total Protein: 6.6 g/dL (ref 6.5–8.1)

## 2015-12-27 LAB — CBC
HEMATOCRIT: 22.7 % — AB (ref 36.0–46.0)
HEMOGLOBIN: 6.8 g/dL — AB (ref 12.0–15.0)
MCH: 30.2 pg (ref 26.0–34.0)
MCHC: 30 g/dL (ref 30.0–36.0)
MCV: 100.9 fL — AB (ref 78.0–100.0)
Platelets: 197 10*3/uL (ref 150–400)
RBC: 2.25 MIL/uL — AB (ref 3.87–5.11)
RDW: 22.6 % — ABNORMAL HIGH (ref 11.5–15.5)
WBC: 5.6 10*3/uL (ref 4.0–10.5)

## 2015-12-27 LAB — PREPARE RBC (CROSSMATCH)

## 2015-12-27 LAB — POC OCCULT BLOOD, ED: FECAL OCCULT BLD: NEGATIVE

## 2015-12-27 MED ORDER — SODIUM CHLORIDE 0.9 % IV SOLN
10.0000 mL/h | Freq: Once | INTRAVENOUS | Status: AC
Start: 2015-12-27 — End: 2015-12-28
  Administered 2015-12-28: 10 mL/h via INTRAVENOUS

## 2015-12-27 NOTE — ED Notes (Signed)
Critical lab called 6.8, pt to be roomed next

## 2015-12-27 NOTE — H&P (Signed)
History and Physical    Robyn Porter [id number] DOB: 05/23/1950 DOA: 12/27/2015  PCP: Andee Poles [facility] Consultants:  Aundra Dubin - cardiology; [place] - [facility]; Hollie Salk - nephrology Patient coming from: home - lives with nephew and his wife; NOK: Drucie Opitz, nephew, [phone]  Chief Complaint: sent in by PCP for anemia  HPI: Robyn Porter is a 65 y.o. female with medical history significant of afib, CHF, DM, HTN, COPD presenting after she was sent to the ER by her PCP.  Patient saw cardiology last week, he was pleased with weight loss.  He wanted more blood work with PCP this week (Tuesday).  HHN came out yesterday to draw labs.  Hgb is 6.8.  Based on discussion with Drs. Aundra Dubin and Armbruster PCP determined that she needs transfusion and so she was sent in.  Labs from today: Hgb 6.6; Hct 22; MCV 106; creatinie 2.19; BUN 68; GFR 22; Ferritin 254.  H/o heart failure, already on fluid restriction but kidney doctor wants her to drink more.  Stays tired and out of breath for years so hard to say if she is symptomatic.  Occasional dizziness when she puishes too hard with PT - also not new.    Nov 2016 - weak, unable to get off couch.  Had been working daily and this was different.  [place] ER, admitted for 11 days.  Infection "they were afraid was going towards my heart."  Left and went to [facility] x 4 weeks then [facility] care.  May 21 2015 - very SOB, back to [facility] ER.  Shot of Lasix and steroids and discharged.  Jun 11 2015 - Back to ER for SOB again.  Significant weight gain.  Discharged again.  Jun 12 2015 - HHN came to check on her.  50 pounds weight gain in 1 month and sent her to [facility].  Admitted for 18 days (2/14-3/3).  27kg diuresis during hospitalization.  Had GI bleeding during this hospitalization and a negative colonoscopy; Xarelto was held and then restarted.  Discharged to [facility] x 3 weeks and went back on [facility] care.    5/24-26/2017 - had pacer upgraded to  biventricular pacer, discharged back on [facility] care  Aug 1 - routine blood work that showed anemia, told to come to [facility] for transfusion, hospitalized from 8/2-15.  Hgb 7.1 on admission, here positive.  Had EGD (nl) and capsule endoscopy (some blood in very distal ileum/IC valve area).  Colonoscopy with few recently bleeding colonic angiodysplastic lesions, treated with argon plasma coagulation.  Hgb stable above 8.  Stayed another week with good diuresis.  Declined rehab and went home with [facility] care.   ED Course: Per Dr. Vanita Panda: Patient with history of baseline anemia, as well as multiple other medical issues presents with weakness, dyspnea. Here the patient is awake and alert, but is found to have critically abnormal hemoglobin value. Patient had initiation of blood transfusion, was admitted for further evaluation and management.  Review of Systems: As per HPI; otherwise 10 point review of systems reviewed and negative.   Ambulatory Status:  ambulates with walker or wheelchair  Past Medical History:  Diagnosis Date  . Afib ([place])   . Aortic stenosis   . CHF (congestive heart failure) ([place])   . Chronic kidney disease    Stage 3  . COPD (chronic obstructive pulmonary disease) ([place])   . Diabetes mellitus without complication ([place])   . Hypertension   . Pacemaker   . Shortness of breath dyspnea  Past Surgical History:  Procedure Laterality Date  . ABDOMINAL HYSTERECTOMY    . BIV PACEMAKER GENERATOR CHANGE OUT  09/19/2015  . COLONOSCOPY N/A 06/29/2015   Procedure: COLONOSCOPY;  Surgeon: Irene Shipper, MD;  Location: [facility] ENDOSCOPY;  Service: Endoscopy;  Laterality: N/A;  . COLONOSCOPY N/A 12/03/2015   Procedure: COLONOSCOPY;  Surgeon: Gatha Mayer, MD;  Location: [place];  Service: Endoscopy;  Laterality: N/A;  . EP IMPLANTABLE DEVICE N/A 09/19/2015   Procedure: BiV Pacemaker Insertion CRT-P;  Surgeon: Will [name], MD;  Location: [place] [facility];  Service: Cardiovascular;   Laterality: N/A;  . ESOPHAGOGASTRODUODENOSCOPY N/A 11/29/2015   Procedure: ESOPHAGOGASTRODUODENOSCOPY (EGD);  Surgeon: Manus Gunning, MD;  Location: [place];  Service: Gastroenterology;  Laterality: N/A;  . GIVENS CAPSULE STUDY N/A 11/30/2015   Procedure: GIVENS CAPSULE STUDY;  Surgeon: Manus Gunning, MD;  Location: [place];  Service: Gastroenterology;  Laterality: N/A;  . HOT HEMOSTASIS N/A 12/03/2015   Procedure: HOT HEMOSTASIS (ARGON PLASMA COAGULATION/BICAP);  Surgeon: Gatha Mayer, MD;  Location: [facility] ENDOSCOPY;  Service: Endoscopy;  Laterality: N/A;  . PACEMAKER INSERTION      Social History   Social History  . Marital status: Single    Spouse name: N/A  . Number of children: N/A  . Years of education: N/A   Occupational History  . retired - business    Social History Main Topics  . Smoking status: Passive Smoke Exposure - Never Smoker  . Smokeless tobacco: Never Used  . Alcohol use No  . Drug use: No  . Sexual activity: No   Other Topics Concern  . Not on file   Social History Narrative  . No narrative on file    Allergies  Allergen Reactions  . Adhesive [Tape] Other (See Comments)    Tears skin.  Please use "paper" tape  . Amoxicillin Hives  . Contrast Media [Iodinated Diagnostic Agents] Hives  . Shellfish-Derived Products Hives    Can eat crab cakes  . Benadryl [Diphenhydramine Hcl] Rash    Family History  Problem Relation Age of Onset  . Angina Sister   . Emphysema Sister   . Colon cancer Brother     Prior to Admission medications   Medication Sig Start Date End Date Taking? Authorizing Provider  acetaminophen (TYLENOL) 325 MG tablet Take 1-2 tablets (325-650 mg total) by mouth every 6 (six) hours as needed for mild pain. 09/21/15  Yes Shirley Friar, PA-C  albuterol (PROVENTIL HFA;VENTOLIN HFA) 108 (90 Base) MCG/ACT inhaler Inhale 1 puff into the lungs every 6 (six) hours as needed for wheezing or shortness of breath.    Yes Historical Provider, MD  albuterol (PROVENTIL) (2.5 MG/3ML) 0.083% nebulizer solution Take 2.5 mg by nebulization every 8 (eight) hours as needed for wheezing.  05/22/15  Yes Historical Provider, MD  allopurinol (ZYLOPRIM) 100 MG tablet Take 50 mg by mouth daily.    Yes Historical Provider, MD  atorvastatin (LIPITOR) 20 MG tablet Take 20 mg by mouth daily.   Yes Historical Provider, MD  budesonide-formoterol (SYMBICORT) 80-4.5 MCG/ACT inhaler Inhale 2 puffs into the lungs 2 (two) times daily.   Yes Historical Provider, MD  Cholecalciferol (VITAMIN D3) 5000 units CAPS Take 1 capsule by mouth daily.   Yes Historical Provider, MD  cyanocobalamin 500 MCG tablet Take 500 mcg by mouth daily.   Yes Historical Provider, MD  Exenatide ER (BYDUREON) 2 MG PEN Inject 2 mg into the skin every Saturday. Saturday  Yes Historical Provider, MD  ferrous sulfate 325 (65 FE) MG tablet Take 325 mg by mouth 2 (two) times daily with a meal.    Yes Historical Provider, MD  ipratropium-albuterol (DUONEB) 0.5-2.5 (3) MG/3ML SOLN Inhale 3 mLs into the lungs every 6 (six) hours as needed (for shortness of breath).  09/15/15  Yes Historical Provider, MD  loratadine (CLARITIN) 10 MG tablet Take 10 mg by mouth daily.   Yes Historical Provider, MD  metolazone (ZAROXOLYN) 2.5 MG tablet Take 1 tablet (2.5 mg total) by mouth as needed (3 lb weight gain overnight or 5lb week gain in a week). As directed by HF clinic. 12/11/15  Yes Shirley Friar, PA-C  metoprolol succinate (TOPROL-XL) 25 MG 24 hr tablet Take 1 tablet (25 mg total) by mouth daily. 12/12/15  Yes Larey Dresser, MD  Multiple Vitamin (MULTIVITAMIN WITH MINERALS) TABS tablet Take 1 tablet by mouth daily. Reported on 07/09/2015   Yes Historical Provider, MD  potassium chloride SA (K-DUR,KLOR-CON) 20 MEQ tablet Take 2 tablets (40 mEq total) by mouth 2 (two) times daily. Take an extra 40 meq when you take Metolazone 12/11/15  Yes Shirley Friar, PA-C    Rivaroxaban (XARELTO) 15 MG TABS tablet Take 1 tablet (15 mg total) by mouth daily with supper. 07/23/15  Yes Larey Dresser, MD  torsemide (DEMADEX) 20 MG tablet Take 4 tablets (80 mg total) by mouth 2 (two) times daily. 12/11/15  Yes Shirley Friar, PA-C  vitamin C (ASCORBIC ACID) 500 MG tablet Take 500 mg by mouth daily.   Yes Historical Provider, MD    Physical Exam: Vitals:   12/27/15 2315 12/28/15 0000 12/28/15 0101 12/28/15 0240  BP: (!) 107/45 (!) 114/48 (!) 130/57 (!) 119/47  Pulse: (!) 59 (!) 55 64 61  Resp: 14 15 16 18   Temp:   98 F (36.7 C) 97.8 F (36.6 C)  TempSrc:   Oral Oral  SpO2: 98% 94% 95% 96%  Weight:   (!) 150.5 kg (331 lb 11.2 oz)   Height:   5\' 3"  (1.6 m)      General: Appears calm and comfortable and is NAD, morbidly obese Eyes:  PERRL, EOMI, normal lids, iris ENT:  grossly normal hearing, lips & tongue, mmm Neck:  no LAD, masses or thyromegaly Cardiovascular:  RRR, no m/r/g. No LE edema.  Respiratory:  CTA bilaterally, no w/r/r. Normal respiratory effort. Abdomen:  soft, ntnd, NABS Skin:  no rash or induration seen on limited exam Musculoskeletal:  grossly normal tone BUE/BLE, good ROM, no bony abnormality Psychiatric:  grossly normal mood and affect, speech fluent and appropriate, AOx3 Neurologic:  CN 2-12 grossly intact, moves all extremities in coordinated fashion, sensation intact  Labs on Admission: I have personally reviewed following labs and imaging studies  CBC:  Recent Labs Lab 12/27/15 2004  WBC 5.6  HGB 6.8*  HCT 22.7*  MCV 100.9*  PLT [id number]   Basic Metabolic Panel:  Recent Labs Lab 12/27/15 2004  NA 137  K 3.8  CL 103  CO2 23  GLUCOSE 145*  BUN 62*  CREATININE 2.38*  CALCIUM 9.4   GFR: Estimated Creatinine Clearance: 33.6 mL/min (by C-G formula based on SCr of 2.38 mg/dL). Liver Function Tests:  Recent Labs Lab 12/27/15 2004  AST 31  ALT 13*  ALKPHOS 81  BILITOT 1.8*  PROT 6.6  ALBUMIN 3.0*   No  results for input(s): LIPASE, AMYLASE in the last 168 hours. No results for input(s):  AMMONIA in the last 168 hours. Coagulation Profile: No results for input(s): INR, PROTIME in the last 168 hours. Cardiac Enzymes: No results for input(s): CKTOTAL, CKMB, CKMBINDEX, TROPONINI in the last 168 hours. BNP (last 3 results) No results for input(s): PROBNP in the last 8760 hours. HbA1C: No results for input(s): HGBA1C in the last 72 hours. CBG:  Recent Labs Lab 12/28/15 0047  GLUCAP 100*   Lipid Profile: No results for input(s): CHOL, HDL, LDLCALC, TRIG, CHOLHDL, LDLDIRECT in the last 72 hours. Thyroid Function Tests: No results for input(s): TSH, T4TOTAL, FREET4, T3FREE, THYROIDAB in the last 72 hours. Anemia Panel: No results for input(s): VITAMINB12, FOLATE, FERRITIN, TIBC, IRON, RETICCTPCT in the last 72 hours. Urine analysis:    Component Value Date/Time   COLORURINE YELLOW 06/13/2015 0134   APPEARANCEUR CLOUDY (A) 06/13/2015 0134   LABSPEC 1.017 06/13/2015 0134   PHURINE 5.0 06/13/2015 0134   GLUCOSEU NEGATIVE 06/13/2015 0134   HGBUR NEGATIVE 06/13/2015 0134   BILIRUBINUR NEGATIVE 06/13/2015 0134   KETONESUR NEGATIVE 06/13/2015 0134   PROTEINUR 30 (A) 06/13/2015 0134   NITRITE POSITIVE (A) 06/13/2015 0134   LEUKOCYTESUR SMALL (A) 06/13/2015 0134    Creatinine Clearance: Estimated Creatinine Clearance: 33.6 mL/min (by C-G formula based on SCr of 2.38 mg/dL).  Sepsis Labs: @LABRCNTIP (procalcitonin:4,lacticidven:4) )No results found for this or any previous visit (from the past 240 hour(s)).   Radiological Exams on Admission: No results found.  EKG: not done  Assessment/Plan Principal Problem:   Symptomatic anemia Active Problems:   Chronic atrial fibrillation (HCC)   Diabetes mellitus type 2 in obese (HCC)   Morbid obesity (HCC)   CKD (chronic kidney disease)   Chronic anticoagulation   HLD (hyperlipidemia)   Chronic systolic CHF (congestive heart failure)  (HCC)   Angiodysplasia of cecum   Essential hypertension   Symptomatic anemia -It is difficult to tell if patient is truly symptomatic since she has baseline fatigue, DOE, etc and is minimally mobile at baseline, but her hemoglobin is down from 8.2 to 6.8 and so it is reasonable to assume that at least some of her symptoms are associated with her anemia -Patient reported to be heme negative in the ER, but I don't see this listed in the labs so will repeat -Patient with prior h/o angiodysplasia of cecum and so it is reasonable to assume that she may have recurrence -Will admit to telemetry (nursing request due to h/o CHF) and continue to monitor -GI to see in AM - Dr. Havery Moros is supposed to be aware of admission but it may be useful to call to ensure that he is planning to see her -Tagged RBC scan may be beneficial -Hold Xarelto for now -NPO for now -Tranfusing so no additional IVF (and may need Lasix); patient with antibodies so some delay while appropriate blood is obtained  Systolic CHF/afib -Patient with recurrent volume overload requiring aggressive diuresis on prior hospitalizations -Will continue home diuretics for now -Cardiology should probably be asked to evaluate patient to ensure that they are also involved in this complicated patient's care -Afib is rate controlled at this time due to biventricular pacer -Holding Xarelto due to concern for active bleeding  DM -Hold Bydureon -Excellent control on recent A1c -Cover with SSI  HTN -Continue Toprol  HLD -Continue Lipitor  CKD -Appears to be at/slightly better than usual baseline -Will follow  DVT prophylaxis: SCDs Code Status: Full - confirmed with patient/family Family Communication: Family member present throughout  Disposition Plan:  Home once  clinically improved Consults called: GI and cardiology in AM  Admission status: Admit to telemetry    Karmen Bongo MD [facility]  If 7PM-7AM, please  contact night-coverage [email or web address] Password [email or web address]  12/28/2015, 2:42 AM

## 2015-12-27 NOTE — ED Triage Notes (Signed)
Pt has extensive medical issues, pt states she was having routine blood work wich showed her hemoglobin 6.6. Pt denies vomiting, states its hard to say if there is blood in her stool. Pt is on lots of iron so all bowel movements are dark. Pt in NAD, VSS. Denies pain.

## 2015-12-27 NOTE — ED Provider Notes (Signed)
[name] Arrival date & time: 12/27/15  1955     History   Chief Complaint Chief Complaint  Patient presents with  . Abnormal Lab    HPI Robyn Porter is a 65 y.o. female.  HPI  Patient presents with concern of fatigue, dyspnea, and is worsening anemia. Patient acknowledges history of multiple medical issues including A. fib, CHF, baseline anemia. She notes that since a recent hospitalization she has been doing physical therapy, has been progressing well, but has noticed increasing dyspnea, weakness. No new chest pain, belly pain, vomiting. Patient describes black stool, though she takes iron supplements. During her last hospital relation the patient had endoscopy, colonoscopy, was found to have several lesions, requiring intervention. Today, the patient received a phone call stating outpatient laboratory studies were abnormal, and she was instructed to come here for evaluation.  Past Medical History:  Diagnosis Date  . Afib ([place])   . CHF (congestive heart failure) ([place])   . COPD (chronic obstructive pulmonary disease) ([place])   . Diabetes mellitus without complication ([place])   . Hypertension   . Pacemaker   . Shortness of breath dyspnea     Patient Active Problem List   Diagnosis Date Noted  . Angiodysplasia of cecum   . Dyslipidemia   . Melena 11/28/2015  . GI bleed 11/28/2015  . Chronic systolic CHF (congestive heart failure) ([place]) 07/09/2015  . Obesity hypoventilation syndrome ([place]) 07/09/2015  . Proteus mirabilis infection 07/08/2015  . HLD (hyperlipidemia) 07/08/2015  . Gout 07/08/2015  . Vitamin D deficiency 07/08/2015  . Asthma, mild   . Internal bleeding hemorrhoids   . Benign neoplasm of transverse colon   . Chronic anticoagulation   . Morbid obesity ([place])   . NSVT (nonsustained ventricular tachycardia) ([place])   . CKD (chronic kidney disease)   . Cardiomyopathy ([place])   . Chronic atrial fibrillation ([place])  06/13/2015  . Diabetes mellitus type 2 in obese ([place]) 06/13/2015  . History of pacemaker 06/13/2015  . Symptomatic anemia 06/13/2015  . OSA (obstructive sleep apnea) 06/13/2015    Past Surgical History:  Procedure Laterality Date  . ABDOMINAL HYSTERECTOMY    . BIV PACEMAKER GENERATOR CHANGE OUT  09/19/2015  . COLONOSCOPY N/A 06/29/2015   Procedure: COLONOSCOPY;  Surgeon: Irene Shipper, MD;  Location: [facility] ENDOSCOPY;  Service: Endoscopy;  Laterality: N/A;  . COLONOSCOPY N/A 12/03/2015   Procedure: COLONOSCOPY;  Surgeon: Gatha Mayer, MD;  Location: [facility];  Service: Endoscopy;  Laterality: N/A;  . EP IMPLANTABLE DEVICE N/A 09/19/2015   Procedure: BiV Pacemaker Insertion CRT-P;  Surgeon: Will [name], MD;  Location: [facility] CV LAB;  Service: Cardiovascular;  Laterality: N/A;  . ESOPHAGOGASTRODUODENOSCOPY N/A 11/29/2015   Procedure: ESOPHAGOGASTRODUODENOSCOPY (EGD);  Surgeon: Manus Gunning, MD;  Location: [facility];  Service: Gastroenterology;  Laterality: N/A;  . GIVENS CAPSULE STUDY N/A 11/30/2015   Procedure: GIVENS CAPSULE STUDY;  Surgeon: Manus Gunning, MD;  Location: [facility];  Service: Gastroenterology;  Laterality: N/A;  . HOT HEMOSTASIS N/A 12/03/2015   Procedure: HOT HEMOSTASIS (ARGON PLASMA COAGULATION/BICAP);  Surgeon: Gatha Mayer, MD;  Location: [facility] ENDOSCOPY;  Service: Endoscopy;  Laterality: N/A;  . PACEMAKER INSERTION      OB History    No data available       Home Medications    Prior to Admission medications   Medication Sig Start Date End Date Taking? Authorizing Provider  acetaminophen (TYLENOL) 325 MG tablet Take  1-2 tablets (325-650 mg total) by mouth every 6 (six) hours as needed for mild pain. 09/21/15  Yes Shirley Friar, PA-C  albuterol (PROVENTIL HFA;VENTOLIN HFA) 108 (90 Base) MCG/ACT inhaler Inhale 1 puff into the lungs every 6 (six) hours as needed for wheezing or shortness of breath.   Yes Historical Provider, MD    albuterol (PROVENTIL) (2.5 MG/3ML) 0.083% nebulizer solution Take 2.5 mg by nebulization every 8 (eight) hours as needed for wheezing.  05/22/15  Yes Historical Provider, MD  allopurinol (ZYLOPRIM) 100 MG tablet Take 50 mg by mouth daily.    Yes Historical Provider, MD  atorvastatin (LIPITOR) 20 MG tablet Take 20 mg by mouth daily.   Yes Historical Provider, MD  budesonide-formoterol (SYMBICORT) 80-4.5 MCG/ACT inhaler Inhale 2 puffs into the lungs 2 (two) times daily.   Yes Historical Provider, MD  Cholecalciferol (VITAMIN D3) 5000 units CAPS Take 1 capsule by mouth daily.   Yes Historical Provider, MD  cyanocobalamin 500 MCG tablet Take 500 mcg by mouth daily.   Yes Historical Provider, MD  Exenatide ER (BYDUREON) 2 MG PEN Inject 2 mg into the skin every Saturday. Saturday    Yes Historical Provider, MD  ferrous sulfate 325 (65 FE) MG tablet Take 325 mg by mouth 2 (two) times daily with a meal.    Yes Historical Provider, MD  ipratropium-albuterol (DUONEB) 0.5-2.5 (3) MG/3ML SOLN Inhale 3 mLs into the lungs every 6 (six) hours as needed (for shortness of breath).  09/15/15  Yes Historical Provider, MD  loratadine (CLARITIN) 10 MG tablet Take 10 mg by mouth daily.   Yes Historical Provider, MD  metolazone (ZAROXOLYN) 2.5 MG tablet Take 1 tablet (2.5 mg total) by mouth as needed (3 lb weight gain overnight or 5lb week gain in a week). As directed by HF clinic. 12/11/15  Yes Shirley Friar, PA-C  metoprolol succinate (TOPROL-XL) 25 MG 24 hr tablet Take 1 tablet (25 mg total) by mouth daily. 12/12/15  Yes Larey Dresser, MD  Multiple Vitamin (MULTIVITAMIN WITH MINERALS) TABS tablet Take 1 tablet by mouth daily. Reported on 07/09/2015   Yes Historical Provider, MD  potassium chloride SA (K-DUR,KLOR-CON) 20 MEQ tablet Take 2 tablets (40 mEq total) by mouth 2 (two) times daily. Take an extra 40 meq when you take Metolazone 12/11/15  Yes Shirley Friar, PA-C  Rivaroxaban (XARELTO) 15 MG TABS  tablet Take 1 tablet (15 mg total) by mouth daily with supper. 07/23/15  Yes Larey Dresser, MD  torsemide (DEMADEX) 20 MG tablet Take 4 tablets (80 mg total) by mouth 2 (two) times daily. 12/11/15  Yes Shirley Friar, PA-C  vitamin C (ASCORBIC ACID) 500 MG tablet Take 500 mg by mouth daily.   Yes Historical Provider, MD    Family History Family History  Problem Relation Age of Onset  . Angina Sister   . Emphysema Sister   . Colon cancer Brother     Social History Social History  Substance Use Topics  . Smoking status: Passive Smoke Exposure - Never Smoker  . Smokeless tobacco: Never Used  . Alcohol use No     Allergies   Adhesive [tape]; Amoxicillin; Contrast media [iodinated diagnostic agents]; Shellfish-derived products; and Benadryl [diphenhydramine hcl]   Review of Systems Review of Systems  Constitutional:       Per HPI, otherwise negative  HENT:       Per HPI, otherwise negative  Respiratory:       Per HPI, otherwise  negative  Cardiovascular:       Per HPI, otherwise negative  Gastrointestinal: Negative for vomiting.  Endocrine:       Negative aside from HPI  Genitourinary:       Neg aside from HPI   Musculoskeletal:       Per HPI, otherwise negative  Skin: Negative.   Neurological: Negative for syncope.     Physical Exam Updated Vital Signs BP (!) 110/43   Pulse 63   Temp 97.7 F (36.5 C) (Oral)   Resp 16   SpO2 97%   Physical Exam  Constitutional: She is oriented to person, place, and time. She appears well-developed and well-nourished. No distress.  Obese elderly female sitting upright, speaking clearly  HENT:  Head: Normocephalic and atraumatic.  Eyes: Conjunctivae and EOM are normal.  Cardiovascular: Normal rate and regular rhythm.   Pulmonary/Chest: Effort normal and breath sounds normal. No stridor. No respiratory distress.  Abdominal: She exhibits no distension.  Genitourinary: Rectal exam shows external hemorrhoid.    Musculoskeletal: She exhibits no edema.  Neurological: She is alert and oriented to person, place, and time. No cranial nerve deficit.  Skin: Skin is warm and dry.  Psychiatric: She has a normal mood and affect.  Nursing note and vitals reviewed.    ED Treatments / Results  Labs (all labs ordered are listed, but only abnormal results are displayed) Labs Reviewed  COMPREHENSIVE METABOLIC PANEL - Abnormal; Notable for the following:       Result Value   Glucose, Bld 145 (*)    BUN 62 (*)    Creatinine, Ser 2.38 (*)    Albumin 3.0 (*)    ALT 13 (*)    Total Bilirubin 1.8 (*)    GFR calc non Af Amer 20 (*)    GFR calc Af Amer 23 (*)    All other components within normal limits  CBC - Abnormal; Notable for the following:    RBC 2.25 (*)    Hemoglobin 6.8 (*)    HCT 22.7 (*)    MCV 100.9 (*)    RDW 22.6 (*)    All other components within normal limits  POC OCCULT BLOOD, ED  TYPE AND SCREEN  PREPARE RBC (CROSSMATCH)    Chart review notable for admission one month ago, requiring transfusion, further GI evaluation.  Procedures Procedures (including critical care time)  Initial labs notable for hemoglobin 6.8. The patient has had a type and screen, will be prepped for transfusion.  Hemeoccult negative  Medications Ordered in ED Medications  0.9 %  sodium chloride infusion (not administered)     Initial Impression / Assessment and Plan / ED Course  I have reviewed the triage vital signs and the nursing notes.  Pertinent labs & imaging results that were available during my care of the patient were reviewed by me and considered in my medical decision making (see chart for details). Patient with history of baseline anemia, as well as multiple other medical issues presents with weakness, dyspnea. Here the patient is awake and alert, but is found to have critically abnormal hemoglobin value. Patient had initiation of blood transfusion, was admitted for further evaluation  and management.  CRITICAL CARE Performed by: Carmin Muskrat Total critical care time: 35 minutes Critical care time was exclusive of separately billable procedures and treating other patients. Critical care was necessary to treat or prevent imminent or life-threatening deterioration. Critical care was time spent personally by me on the following activities: development of  treatment plan with patient and/or surrogate as well as nursing, discussions with consultants, evaluation of patient's response to treatment, examination of patient, obtaining history from patient or surrogate, ordering and performing treatments and interventions, ordering and review of laboratory studies, ordering and review of radiographic studies, pulse oximetry and re-evaluation of patient's condition.    Carmin Muskrat, MD 12/27/15 2239

## 2015-12-28 ENCOUNTER — Encounter (HOSPITAL_COMMUNITY): Payer: Self-pay | Admitting: General Practice

## 2015-12-28 DIAGNOSIS — E119 Type 2 diabetes mellitus without complications: Secondary | ICD-10-CM

## 2015-12-28 DIAGNOSIS — E785 Hyperlipidemia, unspecified: Secondary | ICD-10-CM

## 2015-12-28 DIAGNOSIS — E1122 Type 2 diabetes mellitus with diabetic chronic kidney disease: Secondary | ICD-10-CM

## 2015-12-28 DIAGNOSIS — E669 Obesity, unspecified: Secondary | ICD-10-CM

## 2015-12-28 DIAGNOSIS — I5022 Chronic systolic (congestive) heart failure: Secondary | ICD-10-CM | POA: Diagnosis not present

## 2015-12-28 DIAGNOSIS — D649 Anemia, unspecified: Secondary | ICD-10-CM

## 2015-12-28 DIAGNOSIS — D500 Iron deficiency anemia secondary to blood loss (chronic): Secondary | ICD-10-CM | POA: Diagnosis not present

## 2015-12-28 DIAGNOSIS — E662 Morbid (severe) obesity with alveolar hypoventilation: Secondary | ICD-10-CM

## 2015-12-28 DIAGNOSIS — Z7901 Long term (current) use of anticoagulants: Secondary | ICD-10-CM

## 2015-12-28 DIAGNOSIS — K5520 Angiodysplasia of colon without hemorrhage: Secondary | ICD-10-CM

## 2015-12-28 DIAGNOSIS — I10 Essential (primary) hypertension: Secondary | ICD-10-CM | POA: Diagnosis present

## 2015-12-28 DIAGNOSIS — N184 Chronic kidney disease, stage 4 (severe): Secondary | ICD-10-CM

## 2015-12-28 LAB — BASIC METABOLIC PANEL
ANION GAP: 11 (ref 5–15)
BUN: 58 mg/dL — ABNORMAL HIGH (ref 6–20)
CHLORIDE: 102 mmol/L (ref 101–111)
CO2: 25 mmol/L (ref 22–32)
Calcium: 9.5 mg/dL (ref 8.9–10.3)
Creatinine, Ser: 2.33 mg/dL — ABNORMAL HIGH (ref 0.44–1.00)
GFR calc non Af Amer: 21 mL/min — ABNORMAL LOW (ref >60)
GFR, EST AFRICAN AMERICAN: 24 mL/min — AB (ref >60)
Glucose, Bld: 78 mg/dL (ref 65–99)
POTASSIUM: 3.5 mmol/L (ref 3.5–5.1)
SODIUM: 138 mmol/L (ref 135–145)

## 2015-12-28 LAB — GLUCOSE, CAPILLARY
GLUCOSE-CAPILLARY: 100 mg/dL — AB (ref 65–99)
GLUCOSE-CAPILLARY: 80 mg/dL (ref 65–99)
GLUCOSE-CAPILLARY: 95 mg/dL (ref 65–99)
GLUCOSE-CAPILLARY: 99 mg/dL (ref 65–99)
Glucose-Capillary: 86 mg/dL (ref 65–99)
Glucose-Capillary: 87 mg/dL (ref 65–99)

## 2015-12-28 LAB — CBC
HEMATOCRIT: 26.4 % — AB (ref 36.0–46.0)
HEMOGLOBIN: 8.1 g/dL — AB (ref 12.0–15.0)
MCH: 30.2 pg (ref 26.0–34.0)
MCHC: 30.7 g/dL (ref 30.0–36.0)
MCV: 98.5 fL (ref 78.0–100.0)
PLATELETS: 190 10*3/uL (ref 150–400)
RBC: 2.68 MIL/uL — AB (ref 3.87–5.11)
RDW: 23.2 % — ABNORMAL HIGH (ref 11.5–15.5)
WBC: 6 10*3/uL (ref 4.0–10.5)

## 2015-12-28 LAB — POCT PREGNANCY, URINE: Preg Test, Ur: NEGATIVE

## 2015-12-28 MED ORDER — ALLOPURINOL 100 MG PO TABS
50.0000 mg | ORAL_TABLET | Freq: Every day | ORAL | Status: DC
  Administered 2015-12-28 – 2015-12-29 (×2): 50 mg via ORAL
  Filled 2015-12-28 (×2): qty 1

## 2015-12-28 MED ORDER — METOLAZONE 2.5 MG PO TABS: 2.5000 mg | ORAL_TABLET | ORAL | Status: DC | PRN

## 2015-12-28 MED ORDER — ACETAMINOPHEN 325 MG PO TABS: 325.0000 mg | ORAL_TABLET | Freq: Four times a day (QID) | ORAL | Status: DC | PRN

## 2015-12-28 MED ORDER — SODIUM CHLORIDE 0.9% FLUSH
3.0000 mL | Freq: Two times a day (BID) | INTRAVENOUS | Status: DC
  Administered 2015-12-29: 3 mL via INTRAVENOUS

## 2015-12-28 MED ORDER — ATORVASTATIN CALCIUM 20 MG PO TABS
20.0000 mg | ORAL_TABLET | Freq: Every day | ORAL | Status: DC
  Administered 2015-12-28 – 2015-12-29 (×2): 20 mg via ORAL
  Filled 2015-12-28 (×2): qty 1

## 2015-12-28 MED ORDER — METOPROLOL SUCCINATE ER 25 MG PO TB24
25.0000 mg | ORAL_TABLET | Freq: Every day | ORAL | Status: DC
  Administered 2015-12-28 – 2015-12-29 (×2): 25 mg via ORAL
  Filled 2015-12-28 (×2): qty 1

## 2015-12-28 MED ORDER — INSULIN ASPART 100 UNIT/ML ~~LOC~~ SOLN: 0.0000 [IU] | Freq: Three times a day (TID) | SUBCUTANEOUS | Status: DC

## 2015-12-28 MED ORDER — DOCUSATE SODIUM 100 MG PO CAPS
100.0000 mg | ORAL_CAPSULE | Freq: Two times a day (BID) | ORAL | Status: DC
  Administered 2015-12-28 – 2015-12-29 (×3): 100 mg via ORAL
  Filled 2015-12-28 (×3): qty 1

## 2015-12-28 MED ORDER — VITAMIN C 500 MG PO TABS
500.0000 mg | ORAL_TABLET | Freq: Every day | ORAL | Status: DC
  Administered 2015-12-28 – 2015-12-29 (×2): 500 mg via ORAL
  Filled 2015-12-28 (×2): qty 1

## 2015-12-28 MED ORDER — FERROUS SULFATE 325 (65 FE) MG PO TABS
325.0000 mg | ORAL_TABLET | Freq: Two times a day (BID) | ORAL | Status: DC
  Administered 2015-12-28 (×2): 325 mg via ORAL
  Filled 2015-12-28 (×2): qty 1

## 2015-12-28 MED ORDER — ALBUTEROL SULFATE (2.5 MG/3ML) 0.083% IN NEBU: 2.5000 mg | INHALATION_SOLUTION | RESPIRATORY_TRACT | Status: DC | PRN

## 2015-12-28 MED ORDER — INSULIN ASPART 100 UNIT/ML ~~LOC~~ SOLN: 0.0000 [IU] | SUBCUTANEOUS | Status: DC

## 2015-12-28 MED ORDER — TORSEMIDE 20 MG PO TABS
80.0000 mg | ORAL_TABLET | Freq: Two times a day (BID) | ORAL | Status: DC
Start: 2015-12-28 — End: 2015-12-29
  Administered 2015-12-28 – 2015-12-29 (×3): 80 mg via ORAL
  Filled 2015-12-28 (×3): qty 4

## 2015-12-28 MED ORDER — ONDANSETRON HCL 4 MG PO TABS: 4.0000 mg | ORAL_TABLET | Freq: Four times a day (QID) | ORAL | Status: DC | PRN

## 2015-12-28 MED ORDER — POTASSIUM CHLORIDE CRYS ER 20 MEQ PO TBCR
40.0000 meq | EXTENDED_RELEASE_TABLET | Freq: Two times a day (BID) | ORAL | Status: DC
  Administered 2015-12-28 – 2015-12-29 (×4): 40 meq via ORAL
  Filled 2015-12-28 (×4): qty 2

## 2015-12-28 MED ORDER — ONDANSETRON HCL 4 MG/2ML IJ SOLN: 4.0000 mg | Freq: Four times a day (QID) | INTRAMUSCULAR | Status: DC | PRN

## 2015-12-28 MED ORDER — MOMETASONE FURO-FORMOTEROL FUM 100-5 MCG/ACT IN AERO
2.0000 | INHALATION_SPRAY | Freq: Two times a day (BID) | RESPIRATORY_TRACT | Status: DC
  Administered 2015-12-28 – 2015-12-29 (×3): 2 via RESPIRATORY_TRACT
  Filled 2015-12-28: qty 8.8

## 2015-12-28 MED ORDER — LORATADINE 10 MG PO TABS
10.0000 mg | ORAL_TABLET | Freq: Every day | ORAL | Status: DC
  Administered 2015-12-28 – 2015-12-29 (×2): 10 mg via ORAL
  Filled 2015-12-28 (×2): qty 1

## 2015-12-28 MED ORDER — IPRATROPIUM-ALBUTEROL 0.5-2.5 (3) MG/3ML IN SOLN: 3.0000 mL | Freq: Four times a day (QID) | RESPIRATORY_TRACT | Status: DC | PRN

## 2015-12-28 NOTE — Care Management CC44 (Signed)
Condition Code 44 Documentation Completed  Patient Details  Name: Jakki Ferrin MRN: [id number] Date of Birth: 1950/10/27   Condition Code 44 given:  Yes Patient signature on Condition Code 44 notice:  Yes Documentation of 2 MD's agreement:  Yes Code 44 added to claim:  Yes    [name], RN 12/28/2015, 2:51 PM

## 2015-12-28 NOTE — Progress Notes (Signed)
PROGRESS NOTE  Arisbet Deterding [id number] DOB: 11/12/1950 DOA: 12/27/2015 PCP: ADDIS,DANIEL, DO  Brief History:  65 year old female with a history of chronic atrial fibrillation, GI bleed with cecal AVMs, systolic CHF, OSA/OHS, COPD, CKD stage 4, diabetes mellitus type 2 presented when she was called by her PCP showing hemoglobin 6.8 that was drawn on 12/25/2015. The patient states that she is largely been doing pretty well without any worsening chest discomfort or shortness of breath. She actually states that she has had increased endurance with home health physical therapy. The patient has chronic melenic stools secondary to iron supplementation. She denies any hematemesis, hematochezia, hematuria. She has some occasional dizziness with physical therapy, but she states that this has been chronic. The patient was recently discharged from the hospital on 12/11/2015 after an extensive GI workup for acute blood loss anemia. 12/03/2015 colonoscopy revealed a few AVMs with stigmata of recent bleeding in the cecum and one small ascending colon AVM which were APCed.  Endoscopy Results on 12/04/2015 Revealed Blood in the distal terminal ileum, with more significant blood in the cecum, but could not identify the source of bleed. The patient's rivaroxaban was restarted on 12/10/2015. Her last dose of rivaroxaban prior to this admission was the evening of 12/26/2015.  Assessment/Plan: Symptomatic anemia/chronic blood loss anemia/Cecal AVMs -[facility] has been consulted -Suspect this may represent slow bleed versus chronic blood loss from her iron deficiency -Continue iron supplementation -Transfused 1 unit PRBC -Trend hemoglobin -Colonoscopy and capsule endoscopy as discussed above. 11/29/2015 EGD negative stomach and negative esophagus  Chronic systolic CHF -Baseline weight 331-333 pounds -Patient is at her baseline weight presently -Continue torsemide 80 mg twice a day -11/29/2015 echo  EF 50-55 percent, mild to moderate AI -Daily weights -Continue potassium supplementation, metoprolol succinate -Appears clinically compensated presently  -pt on prn metolazone 2.5 mg at home for weight gain  Chronic atrial fibrillation -CHADSVASc--5 -Rivaroxaban on hold secondary to GI bleed concerns  -rate controlled -Continue metoprolol succinate  CKD stage IV -Baseline creatinine 1.9-2.3  -am BMP  Diabetes mellitus type 2, well-controlled  -11/29/2015 hemoglobin A1c 5.1  -hold out pt Bydureon -continue SSI for now  Symptomatic bradycardia  -Status post PPM  -Patient had recent PPM upgrade on 09/19/2015   OHS/OSA -Continue nighttime CPAP  Chronic venous stasis -This has contributed to her continued lower extremity edema -Chronic leg changes as noted  Hyperlipidemia -continue statin   Disposition Plan:   Home in 2-3 days  Family Communication:   No Family at bedside--Total time spent 35 minutes.  Greater than 50% spent face to face counseling and coordinating care.   Consultants:  [facility]  Code Status:  FULL   DVT Prophylaxis:  SCDs   Procedures: As Listed in Progress Note Above  Antibiotics: None    Subjective: Patient denies fevers, chills, headache, chest pain, dyspnea, nausea, vomiting, diarrhea, abdominal pain, dysuria, hematuria, hematochezia, and melena.   Objective: Vitals:   12/28/15 0540 12/28/15 0606 12/28/15 0805 12/28/15 0812  BP: (!) 108/45 (!) 111/48 (!) 128/48   Pulse: 60 61 60   Resp: 16 18 18    Temp: 97.9 F (36.6 C) 97.6 F (36.4 C) 97.7 F (36.5 C)   TempSrc: Oral Oral Oral   SpO2: 99% 98%  96%  Weight:      Height:        Intake/Output Summary (Last 24 hours) at 12/28/15 0823 Last data filed at 12/28/15 0810  Gross  per 24 hour  Intake            802.5 ml  Output             1700 ml  Net           -897.5 ml   Weight change:  Exam:   General:  Pt is alert, follows commands appropriately, not in acute  distress  HEENT: No icterus, No thrush, No neck mass, [place]/AT  Cardiovascular: RRR, S1/S2, no rubs, no gallops  Respiratory: CTA bilaterally, no wheezing, no crackles, no rhonchi  Abdomen: Soft/+BS, non tender, non distended, no guarding  Extremities: 1 + LE edema, No lymphangitis, No petechiae, No rashes, no synovitis   Data Reviewed: I have personally reviewed following labs and imaging studies Basic Metabolic Panel:  Recent Labs Lab 12/27/15 2004  NA 137  K 3.8  CL 103  CO2 23  GLUCOSE 145*  BUN 62*  CREATININE 2.38*  CALCIUM 9.4   Liver Function Tests:  Recent Labs Lab 12/27/15 2004  AST 31  ALT 13*  ALKPHOS 81  BILITOT 1.8*  PROT 6.6  ALBUMIN 3.0*   No results for input(s): LIPASE, AMYLASE in the last 168 hours. No results for input(s): AMMONIA in the last 168 hours. Coagulation Profile: No results for input(s): INR, PROTIME in the last 168 hours. CBC:  Recent Labs Lab 12/27/15 2004  WBC 5.6  HGB 6.8*  HCT 22.7*  MCV 100.9*  PLT 197   Cardiac Enzymes: No results for input(s): CKTOTAL, CKMB, CKMBINDEX, TROPONINI in the last 168 hours. BNP: Invalid input(s): POCBNP CBG:  Recent Labs Lab 12/28/15 0047 12/28/15 0433 12/28/15 0755  GLUCAP 100* 95 80   HbA1C: No results for input(s): HGBA1C in the last 72 hours. Urine analysis:    Component Value Date/Time   COLORURINE YELLOW 06/13/2015 0134   APPEARANCEUR CLOUDY (A) 06/13/2015 0134   LABSPEC 1.017 06/13/2015 0134   PHURINE 5.0 06/13/2015 0134   GLUCOSEU NEGATIVE 06/13/2015 0134   HGBUR NEGATIVE 06/13/2015 0134   BILIRUBINUR NEGATIVE 06/13/2015 0134   KETONESUR NEGATIVE 06/13/2015 0134   PROTEINUR 30 (A) 06/13/2015 0134   NITRITE POSITIVE (A) 06/13/2015 0134   LEUKOCYTESUR SMALL (A) 06/13/2015 0134   Sepsis Labs: @LABRCNTIP (procalcitonin:4,lacticidven:4) )No results found for this or any previous visit (from the past 240 hour(s)).   Scheduled Meds: . allopurinol  50 mg Oral Daily   . atorvastatin  20 mg Oral Daily  . docusate sodium  100 mg Oral BID  . ferrous sulfate  325 mg Oral BID WC  . insulin aspart  0-20 Units Subcutaneous Q4H  . loratadine  10 mg Oral Daily  . metoprolol succinate  25 mg Oral Daily  . mometasone-formoterol  2 puff Inhalation BID  . potassium chloride SA  40 mEq Oral BID  . sodium chloride flush  3 mL Intravenous Q12H  . torsemide  80 mg Oral BID  . vitamin C  500 mg Oral Daily   Continuous Infusions:   Procedures/Studies: Dg Chest 2 View  Result Date: 11/28/2015 CLINICAL DATA:  Congestion, weakness. EXAM: CHEST  2 VIEW COMPARISON:  Radiographs of Sep 20, 2015. FINDINGS: Mild cardiomegaly is noted. Left-sided pacemaker is unchanged in position. No pneumothorax or pleural effusion is noted. No acute pulmonary disease is noted. Bony thorax is unremarkable. IMPRESSION: No active cardiopulmonary disease. Electronically Signed   By: Marijo Conception, M.D.   On: 11/28/2015 20:10   Dg Chest Port 1 View  Result Date:  11/29/2015 CLINICAL DATA:  Right PICC placement. EXAM: PORTABLE CHEST 1 VIEW COMPARISON:  11/28/2015. FINDINGS: Interval right PICC extending into the inferior aspect of the superior vena cava. The tip is poorly visualized and appears to be within the inferior aspect of the superior vena cava. Stable enlarged cardiac silhouette and left subclavian pacemaker leads. The pulmonary vasculature and interstitial markings remain mildly prominent. Thoracic spine degenerative changes. IMPRESSION: 1. Right PICC tip in the inferior aspect of the superior vena cava. If a position at the superior cavoatrial junction is desired, it would be recommended that this be advanced 2 cm. 2. Stable cardiomegaly, mild pulmonary vascular congestion and mild chronic interstitial lung disease Electronically Signed   By: Claudie Revering M.D.   On: 11/29/2015 19:59    Lynea Rollison, DO  [facility] Pager [phone]  If 7PM-7AM, please contact  night-coverage [email or web address] Password [email or web address] 12/28/2015, 8:23 AM   LOS: 1 day

## 2015-12-28 NOTE — Progress Notes (Signed)
Rt set up patient home unit CPAP. 2L O2 bleed in needed. Patient able to place mask on herself when ready.

## 2015-12-28 NOTE — Progress Notes (Signed)
   12/28/15 0101  Vitals  Temp 98 F (36.7 C)  Temp Source Oral  BP (!) 130/57  BP Location Left Wrist  BP Method Automatic  Patient Position (if appropriate) Lying  Pulse Rate 64  Pulse Rate Source Dinamap  Resp 16  Oxygen Therapy  SpO2 95 %  O2 Device Room Air  Height and Weight  Height 5\' 3"  (1.6 m)  Weight (!) 150.5 kg (331 lb 11.2 oz) (b scale)  Type of Scale Used Standing  Type of Weight Actual  BSA (Calculated - sq m) 2.59 sq meters  BMI (Calculated) 58.9  Weight in (lb) to have BMI = 25 140.8  Admitted Pt to rm 3E26 , pt alert and oriented, denied pain at this time, oriented to room, call bell placed within reach, orders carried out. Will continue to monitor.

## 2015-12-28 NOTE — Consult Note (Signed)
[facility] Consult: 10:48 AM 12/28/2015  LOS: 1 day    Referring Provider: Dr Tat  Primary Care Physician:  ADDIS,DANIEL, DO Primary Gastroenterologist:  Dr. Henrene Pastor  Reason for Consultation:  Recurrent acute on chronic anemia.     HPI: Robyn Porter is a 65 y.o. female.  Hx  A fib, on Xarelto.  Non ischemic CM.  CHF, EF 40 -45%. Bradycardia, s/p pacemaker.  CKD stage 3.  Obesity hypoventilation syndrome, sleep apnea, on CPAP. Type 2 DM.  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  C diff in 04/2015. Streptococcal cellulitis with bacteremia 02/2015. Chronic venous stasis with LE edema. Bleeding PR in 05/2015 felt due to hemorrhoids.  06/29/2015 Colonoscopy: tiny transverse polyp (Path: benign polypoid colonic mucosa), internal hemorrhoids (likely source of bleeding)  Hgb 8.3 to 7.8 during 08/2015 CHF admission, pacemaker upgraded at the time.  Did not receive PRBCs.  On po iron, chronically black stools. Periodic, longstanding scant BPR,   Admission 8/3 - 12/11/15 with anemia, Hgb 7.1.  FOBT +.   Received total 2 PRBCs.  Xarelto restarted at discharge.  No ASA.  Hg 8.2 at discharge.   11/29/15 EGD: Normal. 11/30/15 Capsule endoscopy: faint blood starting at TI, more significant blood in cecum, source for blood not identified.  12/03/15 colonoscopy:  Capsule in cecum was moved with Jabier Mutton net into transverse colon.  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  AVMs there and smaller AVM in ascending colon were all laser-ablated.   Followed up with renal within 2 weeks of discharge, Dr Judeth Cornfield.  MD labelled her as stage 3 CKD (per pt who has excellent memory).  No discussion as to initiating epogen or parenteral iron. MD did feel she was dry and wanted to discuss  hydration and liquid intake with cardiology in order to hit sweet spot between dehydration and volume overload.  No meds added or adjusted.  Pt doing well at home.  Following 2 liter fluid restrictions.  Her weight was down to 325 # at MD visit last week.    Since then weight crept up to 233# but this does not correlate with increased CHF sxs.   On routine follow up labs this week, Hgb of 6.8.  MCV 106.  GFR 23 (stable).   Constitutionally not feeling much different: chronic but stable DOE, fatigue.  Home PT, she has been gaining strength and stamina, able to walk up to 80 feet without stopping. Black soft, unformed stool daily.  No BPR or melena.  Occasional anal pruritus. No ASA or NSAIDs.  She has received PRBCs x 2 overnight.       Past Medical History:  Diagnosis Date  . Afib ([place])   . Aortic stenosis   . CHF (congestive heart failure) ([place])   . Chronic kidney disease    Stage 3  . COPD (chronic obstructive pulmonary disease) ([place])   . Diabetes mellitus without complication ([place])   . Hypertension   . Pacemaker  Past Surgical History:  Procedure Laterality Date  . ABDOMINAL HYSTERECTOMY    . BIV PACEMAKER GENERATOR CHANGE OUT  09/19/2015  . COLONOSCOPY N/A 06/29/2015   Procedure: COLONOSCOPY;  Surgeon: Irene Shipper, MD;  Location: [facility] ENDOSCOPY;  Service: Endoscopy;  Laterality: N/A;  . COLONOSCOPY N/A 12/03/2015   Procedure: COLONOSCOPY;  Surgeon: Gatha Mayer, MD;  Location: [place];  Service: Endoscopy;  Laterality: N/A;  . EP IMPLANTABLE DEVICE N/A 09/19/2015   Procedure: BiV Pacemaker Insertion CRT-P;  Surgeon: Will [name], MD;  Location: [place] [facility];  Service: Cardiovascular;  Laterality: N/A;  . ESOPHAGOGASTRODUODENOSCOPY N/A 11/29/2015   Procedure: ESOPHAGOGASTRODUODENOSCOPY (EGD);  Surgeon: Manus Gunning, MD;  Location: [place];  Service: Gastroenterology;  Laterality: N/A;  . GIVENS CAPSULE STUDY N/A 11/30/2015   Procedure: GIVENS CAPSULE  STUDY;  Surgeon: Manus Gunning, MD;  Location: [place];  Service: Gastroenterology;  Laterality: N/A;  . HOT HEMOSTASIS N/A 12/03/2015   Procedure: HOT HEMOSTASIS (ARGON PLASMA COAGULATION/BICAP);  Surgeon: Gatha Mayer, MD;  Location: [facility] ENDOSCOPY;  Service: Endoscopy;  Laterality: N/A;  . PACEMAKER INSERTION      Prior to Admission medications   Medication Sig Start Date End Date Taking? Authorizing Provider  acetaminophen (TYLENOL) 325 MG tablet Take 1-2 tablets (325-650 mg total) by mouth every 6 (six) hours as needed for mild pain. 09/21/15  Yes Shirley Friar, PA-C  albuterol (PROVENTIL HFA;VENTOLIN HFA) 108 (90 Base) MCG/ACT inhaler Inhale 1 puff into the lungs every 6 (six) hours as needed for wheezing or shortness of breath.   Yes Historical Provider, MD  albuterol (PROVENTIL) (2.5 MG/3ML) 0.083% nebulizer solution Take 2.5 mg by nebulization every 8 (eight) hours as needed for wheezing.  05/22/15  Yes Historical Provider, MD  allopurinol (ZYLOPRIM) 100 MG tablet Take 50 mg by mouth daily.    Yes Historical Provider, MD  atorvastatin (LIPITOR) 20 MG tablet Take 20 mg by mouth daily.   Yes Historical Provider, MD  budesonide-formoterol (SYMBICORT) 80-4.5 MCG/ACT inhaler Inhale 2 puffs into the lungs 2 (two) times daily.   Yes Historical Provider, MD  Cholecalciferol (VITAMIN D3) 5000 units CAPS Take 1 capsule by mouth daily.   Yes Historical Provider, MD  cyanocobalamin 500 MCG tablet Take 500 mcg by mouth daily.   Yes Historical Provider, MD  Exenatide ER (BYDUREON) 2 MG PEN Inject 2 mg into the skin every Saturday. Saturday    Yes Historical Provider, MD  ferrous sulfate 325 (65 FE) MG tablet Take 325 mg by mouth 2 (two) times daily with a meal.    Yes Historical Provider, MD  ipratropium-albuterol (DUONEB) 0.5-2.5 (3) MG/3ML SOLN Inhale 3 mLs into the lungs every 6 (six) hours as needed (for shortness of breath).  09/15/15  Yes Historical Provider, MD  loratadine  (CLARITIN) 10 MG tablet Take 10 mg by mouth daily.   Yes Historical Provider, MD  metolazone (ZAROXOLYN) 2.5 MG tablet Take 1 tablet (2.5 mg total) by mouth as needed (3 lb weight gain overnight or 5lb week gain in a week). As directed by HF clinic. 12/11/15  Yes Shirley Friar, PA-C  metoprolol succinate (TOPROL-XL) 25 MG 24 hr tablet Take 1 tablet (25 mg total) by mouth daily. 12/12/15  Yes Larey Dresser, MD  Multiple Vitamin (MULTIVITAMIN WITH MINERALS) TABS tablet Take 1 tablet by mouth daily. Reported on 07/09/2015   Yes Historical Provider, MD  potassium chloride SA (K-DUR,KLOR-CON) 20 MEQ tablet Take 2 tablets (40  mEq total) by mouth 2 (two) times daily. Take an extra 40 meq when you take Metolazone 12/11/15  Yes Shirley Friar, PA-C  Rivaroxaban (XARELTO) 15 MG TABS tablet Take 1 tablet (15 mg total) by mouth daily with supper. 07/23/15  Yes Larey Dresser, MD  torsemide (DEMADEX) 20 MG tablet Take 4 tablets (80 mg total) by mouth 2 (two) times daily. 12/11/15  Yes Shirley Friar, PA-C  vitamin C (ASCORBIC ACID) 500 MG tablet Take 500 mg by mouth daily.   Yes Historical Provider, MD    Scheduled Meds: . allopurinol  50 mg Oral Daily  . atorvastatin  20 mg Oral Daily  . docusate sodium  100 mg Oral BID  . ferrous sulfate  325 mg Oral BID WC  . insulin aspart  0-20 Units Subcutaneous Q4H  . loratadine  10 mg Oral Daily  . metoprolol succinate  25 mg Oral Daily  . mometasone-formoterol  2 puff Inhalation BID  . potassium chloride SA  40 mEq Oral BID  . sodium chloride flush  3 mL Intravenous Q12H  . torsemide  80 mg Oral BID  . vitamin C  500 mg Oral Daily   Infusions:   PRN Meds: acetaminophen, albuterol, ipratropium-albuterol, ondansetron **OR** ondansetron (ZOFRAN) IV   Allergies as of 12/27/2015 - Review Complete 12/27/2015  Allergen Reaction Noted  . Adhesive [tape] Other (See Comments) 11/28/2015  . Amoxicillin Hives 06/12/2015  . Contrast media  [iodinated diagnostic agents] Hives 06/12/2015  . Shellfish-derived products Hives 07/13/2015  . Benadryl [diphenhydramine hcl] Rash 08/30/2015    Family History  Problem Relation Age of Onset  . Angina Sister   . Emphysema Sister   . Colon cancer Brother     Social History   Social History  . Marital status: Single    Spouse name: N/A  . Number of children: N/A  . Years of education: N/A   Occupational History  . retired - business    Social History Main Topics  . Smoking status: Passive Smoke Exposure - Never Smoker  . Smokeless tobacco: Never Used  . Alcohol use No  . Drug use: No  . Sexual activity: No   Other Topics Concern  . Not on file   Social History Narrative  . No narrative on file    REVIEW OF SYSTEMS: Constitutional:  Per HPI ENT:  No nose bleeds Pulm:  Per HPI CV:  No palpitations, no LE edema.  GU:  No hematuria, no frequency GI:  No nausea, abd pain, dysphagia Heme:  Frequent purpura but no significant bruising or bleeding.     Transfusions:  Per HPI Neuro:  No headaches, no peripheral tingling or numbness.  No fainting Derm: stable venous dermatitis on LE.  No recurrent cellulitis since discharge 1 month ago  Endocrine:  No sweats or chills.  No polyuria or dysuria.  She is good about deeping records of blood sugars and weights (has app on phone) Immunization:  Not queried.   Travel:  None beyond her home in [place] and [employer].  Planning a 3 day vaca to [place] starting 9/9.     PHYSICAL EXAM: Vital signs in last 24 hours: Vitals:   12/28/15 0606 12/28/15 0805  BP: (!) 111/48 (!) 128/48  Pulse: 61 60  Resp: 18 18  Temp: 97.6 F (36.4 C) 97.7 F (36.5 C)   Wt Readings from Last 3 Encounters:  12/28/15 (!) 150.5 kg (331 lb 11.2 oz)  12/19/15 (!) 149  kg (328 lb 6.4 oz)  12/11/15 (!) 151.5 kg (333 lb 14.4 oz)    General: pleasant, obese.  Looks chronically ill but not acutely ill or toxic. Head:  Cushingoid faces.  No  asymmetry  Eyes:  No icterus or conj pallor Ears:  Not HOH  Nose:  No discharge Mouth:  Clear.  Very dry MM.   Neck:  No mass, no JVD Lungs:  Clear bil.  No dyspnea or cough Heart: irreg, irreg,  No mrg.  S1/s2 present but heart sounds are distant. Abdomen:  Soft, obese.  Active BS.  NT.  No masses appreciated.   Rectal: deferred  Musc/Skeltl: no joint swelling, redness or contracture/deformity. Extremities:  2 to 3 + edema in legs below knees Neurologic:  Oriented x 3.  Excellent historian.  No tremor or limb weakness. Skin:  No telangectasias. Scaling stasis dermatitis on both shins, no erythema or cellulits Tattoos:  none   Psych:  Very pleasant, calm, cooperative.  Intelligent.   Intake/Output from previous day: 08/31 0701 - 09/01 0700 In: 505 [I.V.:100; Blood:405] Out: 800 [Urine:800] Intake/Output this shift: Total I/O In: 297.5 [Blood:297.5] Out: 900 [Urine:900]  LAB RESULTS:  Recent Labs  12/27/15 2004  WBC 5.6  HGB 6.8*  HCT 22.7*  PLT 197   BMET Lab Results  Component Value Date   NA 137 12/27/2015   NA 142 12/11/2015   NA 140 12/10/2015   K 3.8 12/27/2015   K 3.7 12/11/2015   K 3.5 12/10/2015   CL 103 12/27/2015   CL 98 (L) 12/11/2015   CL 99 (L) 12/10/2015   CO2 23 12/27/2015   CO2 33 (H) 12/11/2015   CO2 32 12/10/2015   GLUCOSE 145 (H) 12/27/2015   GLUCOSE 83 12/11/2015   GLUCOSE 88 12/10/2015   BUN 62 (H) 12/27/2015   BUN 48 (H) 12/11/2015   BUN 48 (H) 12/10/2015   CREATININE 2.38 (H) 12/27/2015   CREATININE 2.58 (H) 12/11/2015   CREATININE 2.45 (H) 12/10/2015   CALCIUM 9.4 12/27/2015   CALCIUM 9.5 12/11/2015   CALCIUM 9.6 12/10/2015   LFT  Recent Labs  12/27/15 2004  PROT 6.6  ALBUMIN 3.0*  AST 31  ALT 13*  ALKPHOS 81  BILITOT 1.8*   PT/INR No results found for: INR, PROTIME Hepatitis Panel No results for input(s): HEPBSAG, HCVAB, HEPAIGM, HEPBIGM in the last 72 hours. C-Diff No components found for: CDIFF Lipase  No  results found for: LIPASE  Drugs of Abuse  No results found for: LABOPIA, COCAINSCRNUR, LABBENZ, AMPHETMU, THCU, LABBARB   RADIOLOGY STUDIES: No results found.  ENDOSCOPIC STUDIES: Per HPI.   IMPRESSION:   *  Acute on chronic anemia.  Extensive work up 1 month ago with ablation of cecal, ascending colon AVMs.  Stools black, stable, on po iron.  S/p PRBCs x 2. Interestingly, despite the drop in Hgb, pt had been feeling stronger with more stamina and decreased DOE in recent weeks,     *  CHF.    *  Chronic Xarelto for a fib.  *  Chronic venous stasis, treated with Keflex recently for recurrent cellulitis.    *  Stage 3 CKD.      PLAN:     *  No plans for EGD, enteroscopy or colonoscopy.   *  Consider hematology and/or renal input for consideration of initiating epogen and parenteral iron, though Ferritin in 8/11 was 141 and Iron labs all wnl.    *   Not  adding PPI given no GER sxs and no evidence of mucosal injury or ulcer on recent EGD.     Azucena Freed  12/28/2015, 10:48 AM Pager: [phone]

## 2015-12-28 NOTE — Care Management Note (Signed)
Case Management Note  Patient Details  Name: Robyn Porter MRN: [id number] Date of Birth: 09-18-1950  Subjective/Objective: Pt presented for recurrent acute on chronic anemia. Pt is from [place]. Pt is currently active with [facility] @ [phone]. Pt wants to resume services with agency. Pt states she lives with [name] and Niece [name]. Niece stays with patient during the day. Per pt she has RW, Rollator, quad cane, and WC at home. Pt states she has issues with standing for long periods and walking long distances. She uses the Rollator in the home. Pt states she has transportation to and from appointments.            Action/Plan: CM did call Tammy with [facility] to make them aware that pt is hospitalized. Pt will need resumption orders once stable for d/c. Orders will need to be faxed to [facility] at [phone]. [facility] Answering Services can be reached at [phone]. Please call CM to assist with disposition needs. No further needs at this time.   Expected Discharge Date:                  Expected Discharge Plan:  [place]  In-House Referral:  NA  Discharge planning Services  CM Consult  Post Acute Care Choice:  Home Health, Resumption of Svcs/PTA Provider Choice offered to:  Patient  DME Arranged:  N/A DME Agency:  NA  HH Arranged:  RN, PT [place] Agency:  Other - See comment ([facility])  Status of Service:  Completed, signed off  If discussed at [place] of Stay Meetings, dates discussed:    Additional Comments:  [name], RN 12/28/2015, 3:05 PM

## 2015-12-28 NOTE — Care Management Obs Status (Signed)
[place] NOTIFICATION   Patient Details  Name: Cassee Thornes MRN: [id number] Date of Birth: [birth year]   Medicare Observation Status Notification Given:  Yes    [name], RN 12/28/2015, 2:49 PM

## 2015-12-29 DIAGNOSIS — Z7901 Long term (current) use of anticoagulants: Secondary | ICD-10-CM | POA: Diagnosis not present

## 2015-12-29 DIAGNOSIS — E1122 Type 2 diabetes mellitus with diabetic chronic kidney disease: Secondary | ICD-10-CM | POA: Diagnosis not present

## 2015-12-29 DIAGNOSIS — I482 Chronic atrial fibrillation: Secondary | ICD-10-CM | POA: Diagnosis not present

## 2015-12-29 DIAGNOSIS — D500 Iron deficiency anemia secondary to blood loss (chronic): Secondary | ICD-10-CM | POA: Diagnosis not present

## 2015-12-29 DIAGNOSIS — D649 Anemia, unspecified: Secondary | ICD-10-CM | POA: Diagnosis not present

## 2015-12-29 DIAGNOSIS — K5520 Angiodysplasia of colon without hemorrhage: Secondary | ICD-10-CM | POA: Diagnosis not present

## 2015-12-29 LAB — GLUCOSE, CAPILLARY
GLUCOSE-CAPILLARY: 88 mg/dL (ref 65–99)
GLUCOSE-CAPILLARY: 108 mg/dL — AB (ref 65–99)

## 2015-12-29 LAB — TYPE AND SCREEN
ABO/RH(D): B POS
Antibody Screen: POSITIVE
DAT, IgG: NEGATIVE
Donor AG Type: NEGATIVE
Donor AG Type: NEGATIVE
UNIT DIVISION: 0
UNIT DIVISION: 0
Unit division: 0

## 2015-12-29 LAB — CBC
HEMATOCRIT: 25.9 % — AB (ref 36.0–46.0)
HEMOGLOBIN: 7.7 g/dL — AB (ref 12.0–15.0)
MCH: 29.7 pg (ref 26.0–34.0)
MCHC: 29.7 g/dL — AB (ref 30.0–36.0)
MCV: 100 fL (ref 78.0–100.0)
Platelets: 177 10*3/uL (ref 150–400)
RBC: 2.59 MIL/uL — AB (ref 3.87–5.11)
RDW: 23.1 % — ABNORMAL HIGH (ref 11.5–15.5)
WBC: 6 10*3/uL (ref 4.0–10.5)

## 2015-12-29 LAB — FERRITIN: FERRITIN: 214 ng/mL (ref 11–307)

## 2015-12-29 LAB — IRON AND TIBC
Iron: 42 ug/dL (ref 28–170)
Saturation Ratios: 11 % (ref 10.4–31.8)
TIBC: 379 ug/dL (ref 250–450)
UIBC: 337 ug/dL

## 2015-12-29 MED ORDER — FERROUS SULFATE 325 (65 FE) MG PO TABS
325.0000 mg | ORAL_TABLET | Freq: Two times a day (BID) | ORAL | Status: DC
  Administered 2015-12-29: 325 mg via ORAL
  Filled 2015-12-29: qty 1

## 2015-12-29 MED ORDER — FERROUS SULFATE 325 (65 FE) MG PO TABS: 325.0000 mg | ORAL_TABLET | Freq: Three times a day (TID) | ORAL | 3 refills | Status: AC

## 2015-12-29 MED ORDER — VITAMIN C 500 MG PO TABS: 500.0000 mg | ORAL_TABLET | Freq: Two times a day (BID) | ORAL | 0 refills | Status: AC

## 2015-12-29 MED ORDER — SODIUM CHLORIDE 0.9 % IV SOLN
125.0000 mg | Freq: Once | INTRAVENOUS | Status: AC
  Administered 2015-12-29: 125 mg via INTRAVENOUS
  Filled 2015-12-29 (×2): qty 10

## 2015-12-29 NOTE — Discharge Summary (Signed)
Physician Discharge Summary  Robyn Porter [id number] DOB: July 16, 1950 DOA: 12/27/2015  PCP: ADDIS,DANIEL, DO  Admit date: 12/27/2015 Discharge date: 12/29/2015  Admitted From: Home Disposition:  Home  Recommendations for Outpatient Follow-up:  1. Follow up with PCP in 1-2 weeks 2. Please obtain BMP/CBC in one week   Home Health: HHPTn RN Equipment/Devices: Walker  Discharge Condition:Stable CODE STATUS:FULL Diet recommendation: Heart Healthy   Brief/Interim Summary: 65 year old female with a history of chronic atrial fibrillation, GI bleed with cecal AVMs, systolic CHF, OSA/OHS, COPD, CKD stage 4, diabetes mellitus type 2 presented when she was called by her PCP showing hemoglobin 6.8 that was drawn on 12/25/2015. The patient states that she is largely been doing pretty well without any worsening chest discomfort or shortness of breath. She actually states that she has had increased endurance with home health physical therapy. The patient has chronic melenic stools secondary to iron supplementation. She denies any hematemesis, hematochezia, hematuria. She has some occasional dizziness with physical therapy, but she states that this has been chronic. The patient was recently discharged from the hospital on 12/11/2015 after an extensive GI workup for acute blood loss anemia. 12/03/2015 colonoscopy revealed a few AVMs with stigmata of recent bleeding in the cecum and one small ascending colon AVM which were APCed.  Capsule Endoscopy Results on 12/04/2015 Revealed Blood in the distal terminal ileum, with more significant blood in the cecum, but could not identify the source of bleed. The patient's rivaroxaban was restarted on 12/10/2015 after her last discharge. Her last dose of rivaroxaban prior to this admission was the evening of 12/26/2015.  Rivaroxaban was held during the admission, but will be restarted after discharge.  Discharge Diagnoses:  Symptomatic anemia/chronic blood loss  anemia/Cecal AVMs -[facility] has been consulted-->no further intervention, Epo/iron as indicated -Suspect this may represents chronic blood loss from her iron deficiency -Continue iron supplementation--increase to tid x one month, then back to bid -iron saturation 11%, ferritin 214-->nulecit x 1 given prior to d/c -Transfused 1 unit PRBC -Trend hemoglobin-->remains stable -Colonoscopy and capsule endoscopy as discussed above. 11/29/2015 EGD negative stomach and negative esophagus -Hgb 7.7 on day of d/c  Chronic systolic CHF -Baseline weight 331-333 pounds -Patient is at her baseline weight presently--discharge weight 326 -Continue torsemide 80 mg twice a day -11/29/2015 echo EF 50-55 percent, mild to moderate AI -lymphedema and body habitus has made clinical assessment of her fluid status difficult -Continue potassium supplementation, metoprolol succinate -Appears clinically compensated presently  -pt on prn metolazone 2.5 mg at home for weight gain  Chronic atrial fibrillation -CHADSVASc--5 -Rivaroxaban initially on hold secondary to GI bleed concerns-->restart after d/c  -rate controlled -Continue metoprolol succinate  CKD stage IV -Baseline creatinine 1.9-2.3  -stable -Follows [facility] Johnette Abraham. [facility]  Diabetes mellitus type 2, well-controlled  -11/29/2015 hemoglobin A1c 5.1  -hold out pt Bydureon-->restart after d/c -continue SSI for now  Symptomatic bradycardia  -Status post PPM  -Patient had recent PPM upgrade on 09/19/2015   OHS/OSA -Continue nighttime CPAP  Chronic venous stasis -This has contributed to her continued lower extremity edema -Chronic leg changes as noted  Hyperlipidemia -continue statin    Discharge Instructions  Discharge Instructions    Diet - low sodium heart healthy    Complete by:  As directed   Increase activity slowly    Complete by:  As directed       Medication List    TAKE these medications   acetaminophen 325 MG  tablet Commonly known as:  TYLENOL Take 1-2 tablets (325-650 mg total) by mouth every 6 (six) hours as needed for mild pain.   albuterol 108 (90 Base) MCG/ACT inhaler Commonly known as:  PROVENTIL HFA;VENTOLIN HFA Inhale 1 puff into the lungs every 6 (six) hours as needed for wheezing or shortness of breath.   albuterol (2.5 MG/3ML) 0.083% nebulizer solution Commonly known as:  PROVENTIL Take 2.5 mg by nebulization every 8 (eight) hours as needed for wheezing.   allopurinol 100 MG tablet Commonly known as:  ZYLOPRIM Take 50 mg by mouth daily.   atorvastatin 20 MG tablet Commonly known as:  LIPITOR Take 20 mg by mouth daily.   budesonide-formoterol 80-4.5 MCG/ACT inhaler Commonly known as:  SYMBICORT Inhale 2 puffs into the lungs 2 (two) times daily.   BYDUREON 2 MG Pen Generic drug:  Exenatide ER Inject 2 mg into the skin every Saturday. Saturday   cyanocobalamin 500 MCG tablet Take 500 mcg by mouth daily.   ferrous sulfate 325 (65 FE) MG tablet Take 1 tablet (325 mg total) by mouth 3 (three) times daily with meals. What changed:  when to take this   ipratropium-albuterol 0.5-2.5 (3) MG/3ML Soln Commonly known as:  DUONEB Inhale 3 mLs into the lungs every 6 (six) hours as needed (for shortness of breath).   loratadine 10 MG tablet Commonly known as:  CLARITIN Take 10 mg by mouth daily.   metolazone 2.5 MG tablet Commonly known as:  ZAROXOLYN Take 1 tablet (2.5 mg total) by mouth as needed (3 lb weight gain overnight or 5lb week gain in a week). As directed by HF clinic.   metoprolol succinate 25 MG 24 hr tablet Commonly known as:  TOPROL-XL Take 1 tablet (25 mg total) by mouth daily.   multivitamin with minerals Tabs tablet Take 1 tablet by mouth daily. Reported on 07/09/2015   potassium chloride SA 20 MEQ tablet Commonly known as:  K-DUR,KLOR-CON Take 2 tablets (40 mEq total) by mouth 2 (two) times daily. Take an extra 40 meq when you take Metolazone     Rivaroxaban 15 MG Tabs tablet Commonly known as:  XARELTO Take 1 tablet (15 mg total) by mouth daily with supper.   torsemide 20 MG tablet Commonly known as:  DEMADEX Take 4 tablets (80 mg total) by mouth 2 (two) times daily.   vitamin C 500 MG tablet Commonly known as:  ASCORBIC ACID Take 1 tablet (500 mg total) by mouth 2 (two) times daily. What changed:  when to take this   Vitamin D3 5000 units Caps Take 1 capsule by mouth daily.       Allergies  Allergen Reactions  . Adhesive [Tape] Other (See Comments)    Tears skin.  Please use "paper" tape  . Amoxicillin Hives  . Contrast Media [Iodinated Diagnostic Agents] Hives  . Shellfish-Derived Products Hives    Can eat crab cakes  . Benadryl [Diphenhydramine Hcl] Rash    Consultations:  [facility]   Procedures/Studies: Dg Chest Port 1 View  Result Date: 11/29/2015 CLINICAL DATA:  Right PICC placement. EXAM: PORTABLE CHEST 1 VIEW COMPARISON:  11/28/2015. FINDINGS: Interval right PICC extending into the inferior aspect of the superior vena cava. The tip is poorly visualized and appears to be within the inferior aspect of the superior vena cava. Stable enlarged cardiac silhouette and left subclavian pacemaker leads. The pulmonary vasculature and interstitial markings remain mildly prominent. Thoracic spine degenerative changes. IMPRESSION: 1. Right PICC tip in the inferior aspect of the superior vena  cava. If a position at the superior cavoatrial junction is desired, it would be recommended that this be advanced 2 cm. 2. Stable cardiomegaly, mild pulmonary vascular congestion and mild chronic interstitial lung disease Electronically Signed   By: Claudie Revering M.D.   On: 11/29/2015 19:59         Discharge Exam: Vitals:   12/28/15 2055 12/29/15 0532  BP:  (!) 119/49  Pulse: 62 61  Resp: 16 16  Temp:  97.4 F (36.3 C)   Vitals:   12/28/15 2053 12/28/15 2055 12/29/15 0532 12/29/15 0849  BP: (!) 118/49  (!) 119/49    Pulse: (!) 59 62 61   Resp:  16 16   Temp: 97.8 F (36.6 C)  97.4 F (36.3 C)   TempSrc: Oral  Oral   SpO2: 96% 96% 100% 96%  Weight:   (!) 148.2 kg (326 lb 11.2 oz)   Height:        General: Pt is alert, awake, not in acute distress Cardiovascular: RRR, S1/S2 +, no rubs, no gallops Respiratory: CTA bilaterally, no wheezing, no rhonchi Abdominal: Soft, NT, ND, bowel sounds + Extremities: 2 + LE edema, no cyanosis   The results of significant diagnostics from this hospitalization (including imaging, microbiology, ancillary and laboratory) are listed below for reference.    Significant Diagnostic Studies: Dg Chest Port 1 View  Result Date: 11/29/2015 CLINICAL DATA:  Right PICC placement. EXAM: PORTABLE CHEST 1 VIEW COMPARISON:  11/28/2015. FINDINGS: Interval right PICC extending into the inferior aspect of the superior vena cava. The tip is poorly visualized and appears to be within the inferior aspect of the superior vena cava. Stable enlarged cardiac silhouette and left subclavian pacemaker leads. The pulmonary vasculature and interstitial markings remain mildly prominent. Thoracic spine degenerative changes. IMPRESSION: 1. Right PICC tip in the inferior aspect of the superior vena cava. If a position at the superior cavoatrial junction is desired, it would be recommended that this be advanced 2 cm. 2. Stable cardiomegaly, mild pulmonary vascular congestion and mild chronic interstitial lung disease Electronically Signed   By: Claudie Revering M.D.   On: 11/29/2015 19:59     Microbiology: No results found for this or any previous visit (from the past 240 hour(s)).   Labs: Basic Metabolic Panel:  Recent Labs Lab 12/27/15 2004 12/28/15 1113  NA 137 138  K 3.8 3.5  CL 103 102  CO2 23 25  GLUCOSE 145* 78  BUN 62* 58*  CREATININE 2.38* 2.33*  CALCIUM 9.4 9.5   Liver Function Tests:  Recent Labs Lab 12/27/15 2004  AST 31  ALT 13*  ALKPHOS 81  BILITOT 1.8*  PROT 6.6   ALBUMIN 3.0*   No results for input(s): LIPASE, AMYLASE in the last 168 hours. No results for input(s): AMMONIA in the last 168 hours. CBC:  Recent Labs Lab 12/27/15 2004 12/28/15 1113 12/29/15 0236  WBC 5.6 6.0 6.0  HGB 6.8* 8.1* 7.7*  HCT 22.7* 26.4* 25.9*  MCV 100.9* 98.5 100.0  PLT 197 190 177   Cardiac Enzymes: No results for input(s): CKTOTAL, CKMB, CKMBINDEX, TROPONINI in the last 168 hours. BNP: Invalid input(s): POCBNP CBG:  Recent Labs Lab 12/28/15 1118 12/28/15 1657 12/28/15 2052 12/29/15 0544 12/29/15 1133  GLUCAP 86 87 99 88 108*    Time coordinating discharge:  Greater than 30 minutes  Signed:  Bruce Mayers, DO [facility] Pager: [phone] 12/29/2015, 12:45 PM

## 2015-12-29 NOTE — Progress Notes (Addendum)
[facility] ROUNDING NOTE   Subjective: 1 soft BM yesterday, denies any melena or BRBPR. S/p 1u pRBC  Objective: Vital signs in last 24 hours: Temp:  [97.4 F (36.3 C)-97.9 F (36.6 C)] 97.9 F (36.6 C) (09/02 1230) Pulse Rate:  [59-62] 62 (09/02 1230) Resp:  [16-18] 18 (09/02 1230) BP: (115-119)/(49-50) 115/50 (09/02 1230) SpO2:  [95 %-100 %] 95 % (09/02 1230) Weight:  [326 lb 11.2 oz (148.2 kg)] 326 lb 11.2 oz (148.2 kg) (09/02 0532) Last BM Date: 12/28/15 General: NAD, obese B/l clear Heart: s1s2 Abdomen: soft, NT   Intake/Output from previous day: 09/01 0701 - 09/02 0700 In: 897.5 [P.O.:600; Blood:297.5] Out: 2400 [Urine:2400] Intake/Output this shift: Total I/O In: 360 [P.O.:360] Out: -    Lab Results:  Recent Labs  12/27/15 2004 12/28/15 1113 12/29/15 0236  WBC 5.6 6.0 6.0  HGB 6.8* 8.1* 7.7*  PLT 197 190 177  MCV 100.9* 98.5 100.0   BMET  Recent Labs  12/27/15 2004 12/28/15 1113  NA 137 138  K 3.8 3.5  CL 103 102  CO2 23 25  GLUCOSE 145* 78  BUN 62* 58*  CREATININE 2.38* 2.33*  CALCIUM 9.4 9.5   LFT  Recent Labs  12/27/15 2004  PROT 6.6  ALBUMIN 3.0*  AST 31  ALT 13*  ALKPHOS 5  BILITOT 1.8*    Assessment &Plan  40 yr F with ESRD, CHF with chronic anemia. S/p recent EGD, colonoscopy and capsule endoscopy with evidence of cecal AVM s/p ablation. Patient has normal ferritin level.  Anemia is multifactorial , Hgb responded appropriately to 1uPrbc Consider hematology and/or renal input for consideration of initiating epogen  Cont Xarelto Will sign off, please call with any questions    K. Denzil Magnuson , MD [phone] Mon-Fri 8a-5p [phone] after 5p, weekends, holidays [facility]

## 2015-12-29 NOTE — Progress Notes (Signed)
Discharge instructions given. Pt verbalized understanding and all questions were answered.  

## 2015-12-30 NOTE — Progress Notes (Signed)
Per previous CM direct, this CM faxed facesheet, orders, face to face, H&P, and DC summary to [facility] [phone].  No other CM needs were communicated.

## 2016-01-11 ENCOUNTER — Other Ambulatory Visit (INDEPENDENT_AMBULATORY_CARE_PROVIDER_SITE_OTHER): Payer: Medicare Other

## 2016-01-11 ENCOUNTER — Encounter: Payer: Self-pay | Admitting: Gastroenterology

## 2016-01-11 ENCOUNTER — Ambulatory Visit (INDEPENDENT_AMBULATORY_CARE_PROVIDER_SITE_OTHER): Payer: Medicare Other | Admitting: Gastroenterology

## 2016-01-11 VITALS — BP 102/58 | HR 63 | Ht 64.0 in | Wt 335.0 lb

## 2016-01-11 DIAGNOSIS — D638 Anemia in other chronic diseases classified elsewhere: Secondary | ICD-10-CM

## 2016-01-11 DIAGNOSIS — K5520 Angiodysplasia of colon without hemorrhage: Secondary | ICD-10-CM

## 2016-01-11 DIAGNOSIS — Q2733 Arteriovenous malformation of digestive system vessel: Secondary | ICD-10-CM | POA: Diagnosis not present

## 2016-01-11 LAB — CBC WITH DIFFERENTIAL/PLATELET
BASOS ABS: 0 10*3/uL (ref 0.0–0.1)
Basophils Relative: 0.2 % (ref 0.0–3.0)
EOS ABS: 0.2 10*3/uL (ref 0.0–0.7)
Eosinophils Relative: 3.5 % (ref 0.0–5.0)
HEMATOCRIT: 22.5 % — AB (ref 36.0–46.0)
LYMPHS PCT: 11.4 % — AB (ref 12.0–46.0)
Lymphs Abs: 0.8 10*3/uL (ref 0.7–4.0)
MCHC: 32.7 g/dL (ref 30.0–36.0)
MCV: 94.7 fl (ref 78.0–100.0)
Monocytes Absolute: 0.6 10*3/uL (ref 0.1–1.0)
Monocytes Relative: 8.3 % (ref 3.0–12.0)
NEUTROS ABS: 5.4 10*3/uL (ref 1.4–7.7)
Neutrophils Relative %: 76.6 % (ref 43.0–77.0)
PLATELETS: 168 10*3/uL (ref 150.0–400.0)
RBC: 2.38 Mil/uL — ABNORMAL LOW (ref 3.87–5.11)
RDW: 20.1 % — ABNORMAL HIGH (ref 11.5–15.5)
WBC: 7.1 10*3/uL (ref 4.0–10.5)

## 2016-01-11 NOTE — Progress Notes (Signed)
     01/11/2016 Robyn Porter [id number] 1950/12/28   History of Present Illness:  This is a 65 year old female with ESRD, morbid obesity, and chronic anemia.  Dr. Henrene Pastor patient.  S/p recent EGD, colonoscopy and capsule endoscopy with evidence of cecal AVM s/p ablation for evaluation of anemia. Patient has normal ferritin level.  Anemia is thought to be multifactorial.  She is here for hospital follow-up.  See most recent consult note by Azucena Freed, PA-C, on 12/28/2015 for extensive detail.  Patient is on iron supplements TID so stools are always dark.  Feeling ok recently.  Plan was to consider hematology and/or renal input for consideration of initiating epogen.  Was given the ok to continue her Xarelto for her atrial fibrillation from a GI standpoint.  Has not seen her PCP in follow-up since hospitalization yet.     Current Medications, Allergies, Past Medical History, Past Surgical History, Family History and Social History were reviewed in [employer] record.   Physical Exam: BP (!) 102/58   Pulse 63   Ht 5\' 4"  (1.626 m)   Wt (!) 335 lb (152 kg)   BMI 57.50 kg/m  General: Well developed white female in no acute distress Head: Normocephalic and atraumatic Eyes:  Sclerae anicteric, conjunctiva pink  Ears: Normal auditory acuity Lungs: Clear throughout to auscultation Heart: Irregularly irregular. Abdomen: Soft, obese, non-distended.  Normal bowel sounds.  Non-tender. Musculoskeletal: Symmetrical with no gross deformities  Extremities: No edema  Neurological: Alert oriented x 4, grossly non-focal Psychological:  Alert and cooperative. Normal mood and affect  Assessment and Recommendations: -65 year old female with ESRD and chronic anemia. S/p recent EGD, colonoscopy and capsule endoscopy with evidence of cecal AVM s/p ablation. Patient has normal ferritin level.  Anemia is multifactorial.  Patient is on iron supplements TID so stools are always dark.   Consider hematology and/or renal input for consideration of initiating epogen.  Will check CBC here today, but in the future this should be monitored quite regularly by her PCP and if possible be managed with outpatient transfusions when necessary to avoid hospitalization.  OK to cont Xarelto.

## 2016-01-13 NOTE — Progress Notes (Signed)
Agree. GI follow up prn.

## 2016-01-16 ENCOUNTER — Ambulatory Visit (HOSPITAL_COMMUNITY)
Admission: RE | Admit: 2016-01-16 | Discharge: 2016-01-16 | Disposition: A | Discharge status: Home / Self Care | Payer: Medicare Other | Source: Ambulatory Visit | Attending: Internal Medicine | Admitting: Internal Medicine

## 2016-01-16 ENCOUNTER — Encounter (HOSPITAL_COMMUNITY): Payer: Self-pay

## 2016-01-16 VITALS — BP 96/52 | HR 65 | Ht 64.0 in | Wt 338.8 lb

## 2016-01-16 DIAGNOSIS — Z9111 Patient's noncompliance with dietary regimen: Secondary | ICD-10-CM | POA: Diagnosis not present

## 2016-01-16 DIAGNOSIS — Z6841 Body Mass Index (BMI) 40.0 and over, adult: Secondary | ICD-10-CM | POA: Diagnosis not present

## 2016-01-16 DIAGNOSIS — I878 Other specified disorders of veins: Secondary | ICD-10-CM | POA: Diagnosis not present

## 2016-01-16 DIAGNOSIS — E1169 Type 2 diabetes mellitus with other specified complication: Secondary | ICD-10-CM

## 2016-01-16 DIAGNOSIS — I5022 Chronic systolic (congestive) heart failure: Secondary | ICD-10-CM

## 2016-01-16 DIAGNOSIS — I482 Chronic atrial fibrillation, unspecified: Secondary | ICD-10-CM

## 2016-01-16 DIAGNOSIS — N183 Chronic kidney disease, stage 3 (moderate): Secondary | ICD-10-CM | POA: Insufficient documentation

## 2016-01-16 DIAGNOSIS — Z79899 Other long term (current) drug therapy: Secondary | ICD-10-CM | POA: Insufficient documentation

## 2016-01-16 DIAGNOSIS — Z950 Presence of cardiac pacemaker: Secondary | ICD-10-CM

## 2016-01-16 DIAGNOSIS — G4733 Obstructive sleep apnea (adult) (pediatric): Secondary | ICD-10-CM | POA: Diagnosis not present

## 2016-01-16 DIAGNOSIS — Z7901 Long term (current) use of anticoagulants: Secondary | ICD-10-CM | POA: Insufficient documentation

## 2016-01-16 DIAGNOSIS — D638 Anemia in other chronic diseases classified elsewhere: Secondary | ICD-10-CM

## 2016-01-16 DIAGNOSIS — E669 Obesity, unspecified: Secondary | ICD-10-CM

## 2016-01-16 DIAGNOSIS — Z8679 Personal history of other diseases of the circulatory system: Secondary | ICD-10-CM

## 2016-01-16 DIAGNOSIS — E662 Morbid (severe) obesity with alveolar hypoventilation: Secondary | ICD-10-CM | POA: Diagnosis not present

## 2016-01-16 DIAGNOSIS — R001 Bradycardia, unspecified: Secondary | ICD-10-CM | POA: Insufficient documentation

## 2016-01-16 DIAGNOSIS — E119 Type 2 diabetes mellitus without complications: Secondary | ICD-10-CM | POA: Diagnosis not present

## 2016-01-16 DIAGNOSIS — I10 Essential (primary) hypertension: Secondary | ICD-10-CM

## 2016-01-16 DIAGNOSIS — D649 Anemia, unspecified: Secondary | ICD-10-CM | POA: Insufficient documentation

## 2016-01-16 LAB — BASIC METABOLIC PANEL
Anion gap: 7 (ref 5–15)
BUN: 80 mg/dL — AB (ref 6–20)
CHLORIDE: 106 mmol/L (ref 101–111)
CO2: 25 mmol/L (ref 22–32)
CREATININE: 2.48 mg/dL — AB (ref 0.44–1.00)
Calcium: 9.2 mg/dL (ref 8.9–10.3)
GFR calc Af Amer: 22 mL/min — ABNORMAL LOW (ref >60)
GFR calc non Af Amer: 19 mL/min — ABNORMAL LOW (ref >60)
GLUCOSE: 110 mg/dL — AB (ref 65–99)
Potassium: 4 mmol/L (ref 3.5–5.1)
SODIUM: 138 mmol/L (ref 135–145)

## 2016-01-16 LAB — CBC
HCT: 22.1 % — ABNORMAL LOW (ref 36.0–46.0)
Hemoglobin: 6.8 g/dL — CL (ref 12.0–15.0)
MCH: 30.5 pg (ref 26.0–34.0)
MCHC: 30.8 g/dL (ref 30.0–36.0)
MCV: 99.1 fL (ref 78.0–100.0)
PLATELETS: 181 10*3/uL (ref 150–400)
RBC: 2.23 MIL/uL — ABNORMAL LOW (ref 3.87–5.11)
RDW: 19.3 % — AB (ref 11.5–15.5)
WBC: 6.6 10*3/uL (ref 4.0–10.5)

## 2016-01-16 NOTE — Progress Notes (Signed)
Patient ID: Robyn Porter, female   DOB: 1950-05-23, 65 y.o.   MRN: [id number]    Advanced Heart Failure Clinic Note   PCP: Dr. Marveen Reeks [facility]) Pulmonary: Dr Donovan Kail  Cardiology: Dr. Aundra Dubin  65 yo with history of OHS/OSA, chronic systolic CHF and chronic atrial fibrillation presents for CHF clinic followup.  She was admitted in 2/17 to [facility] with massive volume overload.  She was aggressively diuresed in the hospital and lost around 70 lbs.  She had BRBPR in the hospital, this was thought to be hemorrhoidal bleeding (had colonoscopy).  Echo in 2/17 showed EF 40-45% with mild AS and mild AI.  She was discharged to SNF.  Admitted 09/19/15 - 09/21/15 for upgrade to CRT-P . She was noted to be markedly volume overloaded so HF team took over for diuresis.  Overall diuresed 5.5 L, though weights were thought to be innacurate. (showed up 9 lbs despite good diuresis). Discharge weight 363 lbs.   Admitted 8/2 -12/11/15. Diuresed with CVPs. Discharge weight 333.  She presents today for regular follow up.   Was recently admitted for Symptomatic anemia. Volume status was stable. Recently took trip to [place], had some dietary non-compliance and was up 5 lbs, was resolved with Metolazone.   She is up 5 lbs again over the past week. Breathing is OK, better than it has in awhile. Air conditioning is messed up at home, so has been struggling a little. Working with physical therapy again.  Wears CPAP "most" nights. No frank bleeding, stools chronically dark from Iron.  Does have occasional lightheadedness with rapid standing.    Labs (3/17): K 3.5, creatinine 2.26 => 2.03, HCT 25, BNP 133 Labs (07/19/15) K 3.8, creatinine 1.94, BNP 149.0, Hgb 7.1 Labs (5/17): K 3.8, creatinine 2.27 Labs (12/11/15): K 3.7, creatinine 2.58  PMH: 1. Asthma 2. OHS/OSA: CPAP, home oxygen. 3. CHF: Chronic systolic CHF.   - Echo (Q000111Q) with EF 40-45%, mild aortic stenosis, moderate aortic insufficiency.  - Medtronic CRT  upgrade 5/17.  4. Aortic valve disorder: Mild aortic stenosis, moderate aortic insufficiency on 2/17 echo. 5. Chronic atrial fibrillation. 6. CKD stage III 7. Lower GI bleeding in 2/17, likely hemorrhoidal.   8. 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     SH: Lives in [place] with nephew.  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 Nonsmoker, no ETOH.   FH: No premature CAD.  ROS: All systems reviewed and negative except as per HPI.   Current Outpatient Prescriptions  Medication Sig Dispense Refill  . acetaminophen (TYLENOL) 325 MG tablet Take 1-2 tablets (325-650 mg total) by mouth every 6 (six) hours as needed for mild pain.    Marland Kitchen albuterol (PROVENTIL HFA;VENTOLIN HFA) 108 (90 Base) MCG/ACT inhaler Inhale 1 puff into the lungs every 6 (six) hours as needed for wheezing or shortness of breath.    Marland Kitchen albuterol (PROVENTIL) (2.5 MG/3ML) 0.083% nebulizer solution Take 2.5 mg by nebulization every 8 (eight) hours as needed for wheezing.   0  . allopurinol (ZYLOPRIM) 100 MG tablet Take 50 mg by mouth daily.     Marland Kitchen atorvastatin (LIPITOR) 20 MG tablet Take 20 mg by mouth daily.    . budesonide-formoterol (SYMBICORT) 80-4.5 MCG/ACT inhaler Inhale 2 puffs into the lungs 2 (two) times daily.    . Cholecalciferol (VITAMIN D3) 5000 units CAPS Take 1 capsule by mouth daily.    . cyanocobalamin 500 MCG tablet Take 500 mcg by mouth daily.    Marland Kitchen  Exenatide ER (BYDUREON) 2 MG PEN Inject 2 mg into the skin every Saturday. Saturday     . ferrous sulfate 325 (65 FE) MG tablet Take 1 tablet (325 mg total) by mouth 3 (three) times daily with meals. 90 tablet 3  . ipratropium-albuterol (DUONEB) 0.5-2.5 (3) MG/3ML SOLN Inhale 3 mLs into the lungs every 6 (six) hours as needed (for shortness of breath).   5  . loratadine (CLARITIN) 10 MG tablet Take 10 mg by mouth daily.    . metolazone (ZAROXOLYN) 2.5 MG tablet Take 1 tablet (2.5 mg total) by mouth as  needed (3 lb weight gain overnight or 5lb week gain in a week). As directed by HF clinic. 10 tablet 3  . metoprolol succinate (TOPROL-XL) 25 MG 24 hr tablet Take 1 tablet (25 mg total) by mouth daily. 60 tablet 3  . Multiple Vitamin (MULTIVITAMIN WITH MINERALS) TABS tablet Take 1 tablet by mouth daily. Reported on 07/09/2015    . potassium chloride SA (K-DUR,KLOR-CON) 20 MEQ tablet Take 2 tablets (40 mEq total) by mouth 2 (two) times daily. Take an extra 40 meq when you take Metolazone 130 tablet 6  . Rivaroxaban (XARELTO) 15 MG TABS tablet Take 1 tablet (15 mg total) by mouth daily with supper. 30 tablet 6  . torsemide (DEMADEX) 20 MG tablet Take 4 tablets (80 mg total) by mouth 2 (two) times daily. 240 tablet 6  . vitamin C (ASCORBIC ACID) 500 MG tablet Take 1 tablet (500 mg total) by mouth 2 (two) times daily. 60 tablet 0   No current facility-administered medications for this encounter.    BP (!) 96/52 (BP Location: Left Arm, Patient Position: Sitting, Cuff Size: Large)   Pulse 65   Ht 5\' 4"  (1.626 m)   Wt (!) 338 lb 12.8 oz (153.7 kg)   SpO2 93%   BMI 58.15 kg/m    Wt Readings from Last 3 Encounters:  01/16/16 (!) 338 lb 12.8 oz (153.7 kg)  01/11/16 (!) 335 lb (152 kg)  12/29/15 (!) 326 lb 11.2 oz (148.2 kg)     General: NAD, obese.  Neck: JVP difficult to assess due to patient size, appears ~8-9 cm. No thyromegaly or thyroid nodule.  Lungs: CTAB, normal effort CV: Nondisplaced PMI.  Heart irregular S1/S2, no S3/S4, 2/6 SEM RUSB with clear S2. Chronic woody edema with gross chronic venous stasis changes.  No carotid bruit.  Normal pedal pulses. CRT-P site stable. Abdomen: Morbidly Obese, soft, NT, ND, no HSM. No bruits or masses. +BS  Skin: Intact without lesions or rashes.  Neurologic: Alert and oriented x 3.  Psych: Normal affect. Extremities: No clubbing or cyanosis.  HEENT: Normal.   Assessment/Plan: 1. Chronic systolic CHF: Suspect nonischemic.  EF 40-45% on 2/17 echo.  Recent upgrade to Medtronic CRT given constant RV pacing.  - Volume status stable, weight up slightly with dietary non-compliance.  - NYHA Class III symptoms.  - Continue torsemide 80 BID and KCl 40 bid.  - Continue metolazone 2.5 mg as needed with extra 40 meq of potassium on those days. - ContinueToprol XL 25 mg bid. No ACEIARB with elevated creatinine.  - No room to up-titrate with soft BP 2. Symptomatic bradycardia: MDT PPM.  Upgraded to CRT given high percentage RV pacing.  3. CKD: Stage III. - BMET today.   - She has now established with Dr. Hollie Salk in Nephrology 4. Chronic atrial fibrillation: She continues on Toprol XL and Xarelto. No further BRBPR.  5. OHS/OSA:  Continue CPAP at night and oxygen as needed during the day.  She will need pulmonary followup.  6. Anemia: Per PCP/GI. - Hgb 7.4 last week. No overt bleeding  GI requested she have redrawn this week. Will add to albs.  7. Chronic venous stasis - With significant bilateral venous stasis changes. 8. Morbid obesity - Starts PT this vs next week. Encouraged to increase activity as able and to continue limiting portions.  9. Disability - Currently out of work.   She is doing well on current regimen.  Weight up over past week in setting of dietary non-compliance, will have her take metolazone tomorrow if weight remains elevated. BMET today  CBC today. Will forward results to GI.   Follow up 6 weeks. She is stable from HF standpoint.  Satira Mccallum Tillery PA-C 01/16/2016

## 2016-01-16 NOTE — Patient Instructions (Signed)
Labs today We will only contact you if something comes back abnormal or we need to make some changes. Otherwise no news is good news!   Your physician recommends that you schedule a follow-up appointment in: 6 weeks with Andy Tillery,PA   Do the following things EVERYDAY: 1) Weigh yourself in the morning before breakfast. Write it down and keep it in a log. 2) Take your medicines as prescribed 3) Eat low salt foods-Limit salt (sodium) to 2000 mg per day.  4) Stay as active as you can everyday 5) Limit all fluids for the day to less than 2 liters   

## 2016-01-18 ENCOUNTER — Telehealth: Payer: Self-pay

## 2016-01-18 ENCOUNTER — Telehealth: Payer: Self-pay | Admitting: Gastroenterology

## 2016-01-18 DIAGNOSIS — D6489 Other specified anemias: Secondary | ICD-10-CM

## 2016-01-18 NOTE — Telephone Encounter (Signed)
Got a call from cardiology today regarding patient's hemoglobin at 6.8 grams that was drawn two days ago. This is down from 7.4 grams five days prior.  I spoke with patient this evening. She says that she is feeling about the same as she was when I saw her last week. Still having dark stools, but has been this way for a while and she is on iron 3 times daily.  I discussed with one of our physicians and we have asked her to come first thing Monday morning to have a CBC drawn stat. She is going to stay in the [place] area until we have those results back and will set her up for potential same-day transfusion if possible and if needed.  She was instructed that if in the interim she begins feeling bad then she needs to go to the ER, preferably at [facility], which is closest to her.  She understands and agrees.

## 2016-01-18 NOTE — Telephone Encounter (Signed)
Please see my telephone note from 9/22 and also my office note from 9/15. Patient to come in for hemoglobin check on Monday morning. May need to be set up for outpatient transfusion. Can we please get her a referral to oncology/hematology as well so that they can possibly consider Epogen to further help her anemia. She's had extensive GI evaluation already.  Also, please forward my office visit and these notes to her PCP as I had asked that they monitor her hemoglobin and try to transfuse her as an outpatient near her home since she lives near [place], [place] and has to travel quite a ways to get down here.  Thank you,  Jess

## 2016-01-18 NOTE — Telephone Encounter (Signed)
Luretha Murphy PA at [facility] office called to states the pt's Hgb is now 6.8 down form 7.4 last week.  Please advise

## 2016-01-21 ENCOUNTER — Other Ambulatory Visit: Payer: Self-pay

## 2016-01-21 ENCOUNTER — Telehealth: Payer: Self-pay | Admitting: Hematology and Oncology

## 2016-01-21 ENCOUNTER — Other Ambulatory Visit (INDEPENDENT_AMBULATORY_CARE_PROVIDER_SITE_OTHER): Payer: Medicare Other

## 2016-01-21 ENCOUNTER — Encounter: Payer: Self-pay | Admitting: Hematology and Oncology

## 2016-01-21 ENCOUNTER — Telehealth: Payer: Self-pay | Admitting: Gastroenterology

## 2016-01-21 DIAGNOSIS — D6489 Other specified anemias: Secondary | ICD-10-CM | POA: Diagnosis not present

## 2016-01-21 DIAGNOSIS — D649 Anemia, unspecified: Secondary | ICD-10-CM

## 2016-01-21 LAB — CBC WITH DIFFERENTIAL/PLATELET
BASOS PCT: 0.5 % (ref 0.0–3.0)
Basophils Absolute: 0 10*3/uL (ref 0.0–0.1)
Eosinophils Absolute: 0.2 10*3/uL (ref 0.0–0.7)
Eosinophils Relative: 3.7 % (ref 0.0–5.0)
Hemoglobin: 6.9 g/dL — CL (ref 12.0–15.0)
LYMPHS ABS: 0.8 10*3/uL (ref 0.7–4.0)
LYMPHS PCT: 12.3 % (ref 12.0–46.0)
MCHC: 32.9 g/dL (ref 30.0–36.0)
MCV: 96.9 fl (ref 78.0–100.0)
MONOS PCT: 11.5 % (ref 3.0–12.0)
Monocytes Absolute: 0.7 10*3/uL (ref 0.1–1.0)
NEUTROS ABS: 4.5 10*3/uL (ref 1.4–7.7)
NEUTROS PCT: 72 % (ref 43.0–77.0)
PLATELETS: 198 10*3/uL (ref 150.0–400.0)
RBC: 2.15 Mil/uL — ABNORMAL LOW (ref 3.87–5.11)
RDW: 21.3 % — AB (ref 11.5–15.5)
WBC: 6.2 10*3/uL (ref 4.0–10.5)

## 2016-01-21 NOTE — Telephone Encounter (Signed)
Referral made to hematology. Notes sent to pts PCP.

## 2016-01-21 NOTE — Telephone Encounter (Signed)
Patient notified ok to go home.  She is asked to make sure she keeps appt scheduled for hematology.

## 2016-01-21 NOTE — Telephone Encounter (Signed)
Called the patient to schedule an appt. Per [place] pt needed to be seen sooner than later. Offered pt 10/3 appt, but she stated she wouldn't be able to make the appt. Appt was scheduled with [facility] for 10/16 at 1pm per pt request. Pt confirmed. Demographics verified. Letter to the patient.

## 2016-01-23 ENCOUNTER — Encounter: Payer: Self-pay | Admitting: Hematology and Oncology

## 2016-02-10 ENCOUNTER — Other Ambulatory Visit: Payer: Self-pay | Admitting: Internal Medicine

## 2016-02-11 ENCOUNTER — Ambulatory Visit (HOSPITAL_BASED_OUTPATIENT_CLINIC_OR_DEPARTMENT_OTHER): Payer: Medicare Other

## 2016-02-11 ENCOUNTER — Encounter: Payer: Self-pay | Admitting: Hematology and Oncology

## 2016-02-11 ENCOUNTER — Ambulatory Visit (HOSPITAL_BASED_OUTPATIENT_CLINIC_OR_DEPARTMENT_OTHER): Payer: Medicare Other | Admitting: Hematology and Oncology

## 2016-02-11 DIAGNOSIS — M7989 Other specified soft tissue disorders: Secondary | ICD-10-CM | POA: Diagnosis not present

## 2016-02-11 DIAGNOSIS — D649 Anemia, unspecified: Secondary | ICD-10-CM

## 2016-02-11 DIAGNOSIS — Z800 Family history of malignant neoplasm of digestive organs: Secondary | ICD-10-CM

## 2016-02-11 DIAGNOSIS — E669 Obesity, unspecified: Secondary | ICD-10-CM | POA: Diagnosis not present

## 2016-02-11 DIAGNOSIS — L0390 Cellulitis, unspecified: Secondary | ICD-10-CM

## 2016-02-11 LAB — CBC & DIFF AND RETIC
BASO%: 0.7 % (ref 0.0–2.0)
Basophils Absolute: 0 10*3/uL (ref 0.0–0.1)
EOS%: 3.4 % (ref 0.0–7.0)
Eosinophils Absolute: 0.2 10*3/uL (ref 0.0–0.5)
HEMATOCRIT: 21.2 % — AB (ref 34.8–46.6)
HGB: 6.4 g/dL — CL (ref 11.6–15.9)
Immature Retic Fract: 27.5 % — ABNORMAL HIGH (ref 1.60–10.00)
LYMPH%: 11.2 % — AB (ref 14.0–49.7)
MCH: 31.7 pg (ref 25.1–34.0)
MCHC: 30.4 g/dL — AB (ref 31.5–36.0)
MCV: 104.2 fL — AB (ref 79.5–101.0)
MONO#: 0.7 10*3/uL (ref 0.1–0.9)
MONO%: 10.2 % (ref 0.0–14.0)
NEUT%: 74.5 % (ref 38.4–76.8)
NEUTROS ABS: 4.7 10*3/uL (ref 1.5–6.5)
PLATELETS: 163 10*3/uL (ref 145–400)
RBC: 2.03 10*6/uL — AB (ref 3.70–5.45)
RDW: 20.2 % — ABNORMAL HIGH (ref 11.2–14.5)
RETIC CT ABS: 167.07 10*3/uL — AB (ref 33.70–90.70)
Retic %: 8.23 % — ABNORMAL HIGH (ref 0.70–2.10)
WBC: 6.3 10*3/uL (ref 3.9–10.3)
lymph#: 0.7 10*3/uL — ABNORMAL LOW (ref 0.9–3.3)
nRBC: 0 % (ref 0–0)

## 2016-02-11 LAB — LACTATE DEHYDROGENASE: LDH: 244 U/L (ref 125–245)

## 2016-02-11 NOTE — Progress Notes (Signed)
[facility] CONSULT NOTE  Patient Care Team: Michell Heinrich, DO as PCP - General (Family Medicine)  CHIEF COMPLAINTS/PURPOSE OF CONSULTATION:  Severe normocytic anemia  HISTORY OF PRESENTING ILLNESS:  Robyn Porter 65 y.o. female is here because of chronic problems with anemia. Patient used to get her care at [facility]. In February she was admitted with congestive heart failure and was diuresed for about 70 pounds weight loss. She continues to have chronic health issues including leg swelling and recurrent cellulitis. Recently she was in the hospital for cellulitis.  On 01/21/2016, patient had a hemoglobin of 6.9 with an MCV of 97. Her white blood cell count and platelets were normal. She had been anemic all the way since February 2017. On 01/04/2016 she received blood transfusion. She lives in [place] and has to come in in a motorized wheelchair because of lower extremity swelling make it difficult for her to walk. Extremely obese. She has marked lower extremity edema with some skin thickening.  I reviewed her records extensively and collaborated the history with the patient.   MEDICAL HISTORY:  Past Medical History:  Diagnosis Date  . Afib ([place])   . Aortic stenosis   . CHF (congestive heart failure) ([place])   . Chronic kidney disease    Stage 3  . COPD (chronic obstructive pulmonary disease) ([place])   . Diabetes mellitus without complication ([place])   . Hypertension   . Pacemaker     SURGICAL HISTORY: Past Surgical History:  Procedure Laterality Date  . ABDOMINAL HYSTERECTOMY    . BIV PACEMAKER GENERATOR CHANGE OUT  09/19/2015  . COLONOSCOPY N/A 06/29/2015   Procedure: COLONOSCOPY;  Surgeon: Irene Shipper, MD;  Location: [facility] ENDOSCOPY;  Service: Endoscopy;  Laterality: N/A;  . COLONOSCOPY N/A 12/03/2015   Procedure: COLONOSCOPY;  Surgeon: Gatha Mayer, MD;  Location: [facility];  Service: Endoscopy;  Laterality: N/A;  . EP IMPLANTABLE DEVICE N/A  09/19/2015   Procedure: BiV Pacemaker Insertion CRT-P;  Surgeon: Will [name], MD;  Location: [facility] CV LAB;  Service: Cardiovascular;  Laterality: N/A;  . ESOPHAGOGASTRODUODENOSCOPY N/A 11/29/2015   Procedure: ESOPHAGOGASTRODUODENOSCOPY (EGD);  Surgeon: Manus Gunning, MD;  Location: [facility];  Service: Gastroenterology;  Laterality: N/A;  . GIVENS CAPSULE STUDY N/A 11/30/2015   Procedure: GIVENS CAPSULE STUDY;  Surgeon: Manus Gunning, MD;  Location: [facility];  Service: Gastroenterology;  Laterality: N/A;  . HOT HEMOSTASIS N/A 12/03/2015   Procedure: HOT HEMOSTASIS (ARGON PLASMA COAGULATION/BICAP);  Surgeon: Gatha Mayer, MD;  Location: [facility] ENDOSCOPY;  Service: Endoscopy;  Laterality: N/A;  . PACEMAKER INSERTION      SOCIAL HISTORY: Social History   Social History  . Marital status: Single    Spouse name: N/A  . Number of children: N/A  . Years of education: N/A   Occupational History  . retired - business    Social History Main Topics  . Smoking status: Passive Smoke Exposure - Never Smoker  . Smokeless tobacco: Never Used  . Alcohol use No  . Drug use: No  . Sexual activity: No   Other Topics Concern  . Not on file   Social History Narrative  . No narrative on file    FAMILY HISTORY: Family History  Problem Relation Age of Onset  . Angina Sister   . Emphysema Sister   . Colon cancer Brother     ALLERGIES:  is allergic to adhesive [tape]; amoxicillin; contrast media [iodinated diagnostic agents]; shellfish-derived products;  and benadryl [diphenhydramine hcl].  MEDICATIONS:  Current Outpatient Prescriptions  Medication Sig Dispense Refill  . acetaminophen (TYLENOL) 325 MG tablet Take 1-2 tablets (325-650 mg total) by mouth every 6 (six) hours as needed for mild pain.    Marland Kitchen albuterol (PROVENTIL HFA;VENTOLIN HFA) 108 (90 Base) MCG/ACT inhaler Inhale 1 puff into the lungs every 6 (six) hours as needed for wheezing or shortness of breath.     Marland Kitchen albuterol (PROVENTIL) (2.5 MG/3ML) 0.083% nebulizer solution Take 2.5 mg by nebulization every 8 (eight) hours as needed for wheezing.   0  . allopurinol (ZYLOPRIM) 100 MG tablet Take 50 mg by mouth daily.     Marland Kitchen atorvastatin (LIPITOR) 20 MG tablet Take 20 mg by mouth daily.    . budesonide-formoterol (SYMBICORT) 80-4.5 MCG/ACT inhaler Inhale 2 puffs into the lungs 2 (two) times daily.    . Cholecalciferol (VITAMIN D3) 5000 units CAPS Take 1 capsule by mouth daily.    . cyanocobalamin 500 MCG tablet Take 500 mcg by mouth daily.    . Exenatide ER (BYDUREON) 2 MG PEN Inject 2 mg into the skin every Saturday. Saturday     . ferrous sulfate 325 (65 FE) MG tablet Take 1 tablet (325 mg total) by mouth 3 (three) times daily with meals. 90 tablet 3  . ipratropium-albuterol (DUONEB) 0.5-2.5 (3) MG/3ML SOLN Inhale 3 mLs into the lungs every 6 (six) hours as needed (for shortness of breath).   5  . loratadine (CLARITIN) 10 MG tablet Take 10 mg by mouth daily.    . metolazone (ZAROXOLYN) 2.5 MG tablet Take 1 tablet (2.5 mg total) by mouth as needed (3 lb weight gain overnight or 5lb week gain in a week). As directed by HF clinic. 10 tablet 3  . metoprolol succinate (TOPROL-XL) 25 MG 24 hr tablet Take 1 tablet (25 mg total) by mouth daily. 60 tablet 3  . Multiple Vitamin (MULTIVITAMIN WITH MINERALS) TABS tablet Take 1 tablet by mouth daily. Reported on 07/09/2015    . potassium chloride SA (K-DUR,KLOR-CON) 20 MEQ tablet Take 2 tablets (40 mEq total) by mouth 2 (two) times daily. Take an extra 40 meq when you take Metolazone 130 tablet 6  . Rivaroxaban (XARELTO) 15 MG TABS tablet Take 1 tablet (15 mg total) by mouth daily with supper. 30 tablet 6  . torsemide (DEMADEX) 20 MG tablet Take 4 tablets (80 mg total) by mouth 2 (two) times daily. 240 tablet 6  . vitamin C (ASCORBIC ACID) 500 MG tablet Take 1 tablet (500 mg total) by mouth 2 (two) times daily. 60 tablet 0   No current facility-administered  medications for this visit.     REVIEW OF SYSTEMS:   Constitutional: Denies fevers, chills or abnormal night sweats Eyes: Denies blurriness of vision, double vision or watery eyes Ears, nose, mouth, throat, and face: Denies mucositis or sore throat Respiratory: Denies cough, dyspnea or wheezes Cardiovascular: Denies palpitation, chest discomfort or lower extremity swelling Gastrointestinal:  Denies nausea, heartburn or change in bowel habits Skin: Denies abnormal skin rashes Lymphatics: Denies new lymphadenopathy or easy bruising Neurological:Denies numbness, tingling or new weaknesses Behavioral/Psych: Mood is stable, no new changes  Breast:  Denies any palpable lumps or discharge All other systems were reviewed with the patient and are negative.  PHYSICAL EXAMINATION: ECOG PERFORMANCE STATUS: 2 - Symptomatic, <50% confined to bed  Vitals:   02/11/16 1315  BP: (!) 120/38  Pulse: 63  Resp: (!) 21  Temp: 97.7 F (36.5 C)  Filed Weights   02/11/16 1315  Weight: (!) 351 lb (159.2 kg)    GENERAL:alert, no distress and comfortable SKIN: skin color, texture, turgor are normal, no rashes or significant lesions EYES: normal, conjunctiva are pink and non-injected, sclera clear OROPHARYNX:no exudate, no erythema and lips, buccal mucosa, and tongue normal  NECK: supple, thyroid normal size, non-tender, without nodularity LYMPH:  no palpable lymphadenopathy in the cervical, axillary or inguinal LUNGS: clear to auscultation and percussion with normal breathing effort HEART: regular rate & rhythm and no murmurs and no lower extremity edema ABDOMEN:abdomen soft, non-tender and normal bowel sounds Musculoskeletal:no cyanosis of digits and no clubbing  PSYCH: alert & oriented x 3 with fluent speech NEURO: no focal motor/sensory deficits  LABORATORY DATA:  I have reviewed the data as listed Lab Results  Component Value Date   WBC 6.2 01/21/2016   HGB 6.9 Repeated and verified X2.  (LL) 01/21/2016   HCT 20.9 Repeated and verified X2. (LL) 01/21/2016   MCV 96.9 01/21/2016   PLT 198.0 01/21/2016   Lab Results  Component Value Date   NA 138 01/16/2016   K 4.0 01/16/2016   CL 106 01/16/2016   CO2 25 01/16/2016    RADIOGRAPHIC STUDIES: I have personally reviewed the radiological reports and agreed with the findings in the report.  ASSESSMENT AND PLAN:  Normochromic normocytic anemia Normochromic normocytic anemia: Long-term Are labs indicate that in February 2017 she had a hemoglobin of 9.4 with an MCV of 94 Workup done at that time revealed normal iron studies along with normal O-96 or folic acid Over the past 8 months patient has become profoundly anemic. Most recent labs on 01/21/2016 revealing hemoglobin of 6.9 with an MCV of 96.9 and a platelet count of 198 with a normal white blood cell count 6.2 and normal differential Iron studies on 12/29/2015 showed 11% iron saturation with a TIBC of 379 and a ferritin of 214  Upper endoscopies and colonoscopies: Revealed some evidence of AV malformation and possible source of bleeding.  Differential diagnosis: The differential for normocytic normochromic anemia with normal E-95 and folic acid along with iron studies includes 1. Anemia of chronic disease 2. multiple myeloma 3. Hypothyroidism 4. Bone marrow disorders causing red cell aplasia which include myelodysplastic syndrome  Plan: 1. Recheck CBC with differential and blood smear 2. Send for serum protein electrophoresis 3. TSH 4. Haptoglobin, LDH, direct Coombs test  If they normal, plan for a bone marrow biopsy  Return to clinic in 2 weeks to discuss the results of the tests and plan for the treatments  All questions were answered. The patient knows to call the clinic with any problems, questions or concerns.    Rulon Eisenmenger, MD 02/11/16

## 2016-02-11 NOTE — Assessment & Plan Note (Signed)
Normochromic normocytic anemia: Long-term Are labs indicate that in February 2017 she had a hemoglobin of 9.4 with an MCV of 94 Workup done at that time revealed normal iron studies along with normal I-62 or folic acid Over the past 8 months patient has become profoundly anemic. Most recent labs on 01/21/2016 revealing hemoglobin of 6.9 with an MCV of 96.9 and a platelet count of 198 with a normal white blood cell count 6.2 and normal differential Iron studies on 12/29/2015 showed 11% iron saturation with a TIBC of 379 and a ferritin of 214  Upper endoscopies and colonoscopies: Revealed some evidence of AV malformation and possible source of bleeding.  Differential diagnosis: The differential for normocytic normochromic anemia with normal V-03 and folic acid along with iron studies includes 1. Anemia of chronic disease 2. multiple myeloma 3. Hypothyroidism 4. Bone marrow disorders causing red cell aplasia which include myelodysplastic syndrome  Plan: 1. Recheck CBC with differential and blood smear 2. Send for serum protein electrophoresis 3. TSH 4. If they're below negative plan for a bone marrow biopsy  Return to clinic in 2 weeks to discuss the results of the tests and plan for the treatments

## 2016-02-12 LAB — T3 UPTAKE
Free Thyroxine Index: 2.7 (ref 1.2–4.9)
T3 Uptake Ratio: 31 % (ref 24–39)

## 2016-02-12 LAB — DIRECT ANTIGLOBULIN TEST (NOT AT ARMC)
ANTI-COMPLEMENT: NEGATIVE
Coombs', Direct: POSITIVE — AB

## 2016-02-12 LAB — HAPTOGLOBIN: HAPTOGLOBIN: 81 mg/dL (ref 34–200)

## 2016-02-12 LAB — T4: T4 TOTAL: 8.8 ug/dL (ref 4.5–12.0)

## 2016-02-12 LAB — T4, FREE: FREE T4: 1.64 ng/dL (ref 0.82–1.77)

## 2016-02-12 LAB — TSH: TSH: 3.418 m(IU)/L (ref 0.308–3.960)

## 2016-02-12 LAB — ERYTHROPOIETIN: Erythropoietin: 273.1 m[IU]/mL — ABNORMAL HIGH (ref 2.6–18.5)

## 2016-02-12 NOTE — Progress Notes (Signed)
Late entry from 02/11/16 1515: Received critical hgb 6.4.  Reviewed information with Dr. Lindi Adie.

## 2016-02-13 LAB — PROTEIN ELECTROPHORESIS, SERUM
A/G Ratio: 0.8 (ref 0.7–1.7)
ALPHA 1: 0.3 g/dL (ref 0.0–0.4)
Albumin: 3 g/dL (ref 2.9–4.4)
Alpha 2: 0.5 g/dL (ref 0.4–1.0)
Beta: 0.9 g/dL (ref 0.7–1.3)
Gamma Globulin: 2 g/dL — ABNORMAL HIGH (ref 0.4–1.8)
Globulin, Total: 3.6 g/dL (ref 2.2–3.9)
TOTAL PROTEIN: 6.6 g/dL (ref 6.0–8.5)

## 2016-02-16 ENCOUNTER — Other Ambulatory Visit (HOSPITAL_COMMUNITY): Payer: Self-pay | Admitting: Cardiology

## 2016-02-17 ENCOUNTER — Other Ambulatory Visit: Payer: Self-pay | Admitting: Internal Medicine

## 2016-02-25 ENCOUNTER — Telehealth: Payer: Self-pay

## 2016-02-25 NOTE — Telephone Encounter (Signed)
Faxed claim form to [place]

## 2016-02-26 ENCOUNTER — Other Ambulatory Visit: Payer: Self-pay

## 2016-02-26 ENCOUNTER — Telehealth: Payer: Self-pay

## 2016-02-26 DIAGNOSIS — D649 Anemia, unspecified: Secondary | ICD-10-CM

## 2016-02-26 NOTE — Telephone Encounter (Signed)
Received call from pt stating she was following up from appt 2 weeks ago and thought she was supposed to be scheduled for follow up this week.  Upon chart review, looks as if Dr. Lindi Adie intended to see pt back this week for follow up and lab work.  Offered pt appt today or tomorrow afternoon as those were only available slots; however, pt is unable to make these.  Pt reports feeling weak and reports fall a couple days ago.  Given pt's low hgb in the past, I have asked if pt can at least come in for lab work for [place] to review.  Pt in agreement and states she has an appt with Dr. Kirk Ruths at cardio-oncology clinic where she will make sure if he has any concerns he can let our office know.  Pt will come prior to this appt for lab work.  Pt denies any immediate concerns or questions.  She stated she would let us know if anything comes up or she develops any additional side effects.  Pt will be set up to come in next 2 weeks for lab work and follow up with Dr. Lindi Adie unless earlier interventions needed.  Pt verbalized understanding and is without further questions or concerns at this time.

## 2016-02-27 ENCOUNTER — Encounter (HOSPITAL_COMMUNITY): Payer: Self-pay

## 2016-02-27 ENCOUNTER — Ambulatory Visit (HOSPITAL_BASED_OUTPATIENT_CLINIC_OR_DEPARTMENT_OTHER)
Admission: RE | Admit: 2016-02-27 | Discharge: 2016-02-27 | Disposition: A | Discharge status: Home / Self Care | Payer: Medicare Other | Source: Ambulatory Visit | Attending: Cardiology | Admitting: Cardiology

## 2016-02-27 ENCOUNTER — Other Ambulatory Visit (HOSPITAL_BASED_OUTPATIENT_CLINIC_OR_DEPARTMENT_OTHER): Payer: Medicare Other

## 2016-02-27 VITALS — BP 104/64 | HR 64 | Wt 358.0 lb

## 2016-02-27 DIAGNOSIS — I10 Essential (primary) hypertension: Secondary | ICD-10-CM

## 2016-02-27 DIAGNOSIS — G4733 Obstructive sleep apnea (adult) (pediatric): Secondary | ICD-10-CM

## 2016-02-27 DIAGNOSIS — N184 Chronic kidney disease, stage 4 (severe): Secondary | ICD-10-CM

## 2016-02-27 DIAGNOSIS — D649 Anemia, unspecified: Secondary | ICD-10-CM

## 2016-02-27 DIAGNOSIS — I482 Chronic atrial fibrillation, unspecified: Secondary | ICD-10-CM

## 2016-02-27 DIAGNOSIS — E1122 Type 2 diabetes mellitus with diabetic chronic kidney disease: Secondary | ICD-10-CM | POA: Diagnosis not present

## 2016-02-27 DIAGNOSIS — E662 Morbid (severe) obesity with alveolar hypoventilation: Secondary | ICD-10-CM

## 2016-02-27 DIAGNOSIS — I5022 Chronic systolic (congestive) heart failure: Secondary | ICD-10-CM

## 2016-02-27 LAB — CBC WITH DIFFERENTIAL/PLATELET
BASO%: 0.8 % (ref 0.0–2.0)
Basophils Absolute: 0 10*3/uL (ref 0.0–0.1)
EOS ABS: 0.2 10*3/uL (ref 0.0–0.5)
EOS%: 4.9 % (ref 0.0–7.0)
HCT: 23.5 % — ABNORMAL LOW (ref 34.8–46.6)
HEMOGLOBIN: 7.1 g/dL — AB (ref 11.6–15.9)
LYMPH%: 17.8 % (ref 14.0–49.7)
MCH: 32.1 pg (ref 25.1–34.0)
MCHC: 30 g/dL — ABNORMAL LOW (ref 31.5–36.0)
MCV: 107.2 fL — AB (ref 79.5–101.0)
MONO#: 0.6 10*3/uL (ref 0.1–0.9)
MONO%: 17 % — ABNORMAL HIGH (ref 0.0–14.0)
NEUT%: 59.5 % (ref 38.4–76.8)
NEUTROS ABS: 2.1 10*3/uL (ref 1.5–6.5)
PLATELETS: 135 10*3/uL — AB (ref 145–400)
RBC: 2.2 10*6/uL — ABNORMAL LOW (ref 3.70–5.45)
RDW: 20.4 % — AB (ref 11.2–14.5)
WBC: 3.5 10*3/uL — AB (ref 3.9–10.3)
lymph#: 0.6 10*3/uL — ABNORMAL LOW (ref 0.9–3.3)

## 2016-02-27 NOTE — Progress Notes (Signed)
Patient ID: Robyn Porter, female   DOB: 09/14/1950, 65 y.o.   MRN: [id number]    Advanced Heart Failure Clinic Note   PCP: Dr. Marveen Reeks [facility]) Pulmonary: Dr Donovan Kail  Cardiology: Dr. Aundra Dubin  65 yo with history of OHS/OSA, chronic systolic CHF and chronic atrial fibrillation presents for CHF clinic followup.  She was admitted in 2/17 to [facility] with massive volume overload.  She was aggressively diuresed in the hospital and lost around 70 lbs.  She had BRBPR in the hospital, this was thought to be hemorrhoidal bleeding (had colonoscopy).  Echo in 2/17 showed EF 40-45% with mild AS and mild AI.  She was discharged to SNF.  Admitted 09/19/15 - 09/21/15 for upgrade to CRT-P . She was noted to be markedly volume overloaded so HF team took over for diuresis.  Overall diuresed 5.5 L, though weights were thought to be innacurate. (showed up 9 lbs despite good diuresis). Discharge weight 363 lbs.   Admitted 8/2 -12/11/15. Diuresed with CVPs. Discharge weight 333.  She presents today for regular follow up. Her weight is up 20 lbs from last visit, nearly 30 lbs since August. Admits to on-going diet compliance.  Doesn't eat much, but when she does eats junk food, had hot dogs recently. Hasn't eaten anything today. Has only tried metolazone twice since last visit (6 weeks) despite gradual uptrend in weight. She has been very fatigued. Last Hgb 6.4.  Labs drawn today shows Hgb 7.1. States she did have bright red streaking on the toilet paper last night from hemorrhoids. Wears CPAP, but occasionally falls asleep without it.  Does have occasional lightheadedness with rapid.  Fell Sunday night coming out of her bathroom.  Denies injury.  States her legs just gave out on her.  Labs (3/17): K 3.5, creatinine 2.26 => 2.03, HCT 25, BNP 133 Labs (07/19/15) K 3.8, creatinine 1.94, BNP 149.0, Hgb 7.1 Labs (5/17): K 3.8, creatinine 2.27 Labs (12/11/15): K 3.7, creatinine 2.58  PMH: 1. Asthma 2. OHS/OSA: CPAP, home  oxygen. 3. CHF: Chronic systolic CHF.   - Echo (Q000111Q) with EF 40-45%, mild aortic stenosis, moderate aortic insufficiency.  - Medtronic CRT upgrade 5/17.  4. Aortic valve disorder: Mild aortic stenosis, moderate aortic insufficiency on 2/17 echo. 5. Chronic atrial fibrillation. 6. CKD stage III 7. Lower GI bleeding in 2/17, likely hemorrhoidal.   8. 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     SH: Lives in [place] with nephew.  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 Nonsmoker, no ETOH.   FH: No premature CAD.  ROS: All systems reviewed and negative except as per HPI.   Current Outpatient Prescriptions  Medication Sig Dispense Refill  . acetaminophen (TYLENOL) 325 MG tablet Take 1-2 tablets (325-650 mg total) by mouth every 6 (six) hours as needed for mild pain.    Marland Kitchen albuterol (PROVENTIL HFA;VENTOLIN HFA) 108 (90 Base) MCG/ACT inhaler Inhale 1 puff into the lungs every 6 (six) hours as needed for wheezing or shortness of breath.    Marland Kitchen albuterol (PROVENTIL) (2.5 MG/3ML) 0.083% nebulizer solution Take 2.5 mg by nebulization every 8 (eight) hours as needed for wheezing.   0  . allopurinol (ZYLOPRIM) 100 MG tablet Take 50 mg by mouth daily.     Marland Kitchen atorvastatin (LIPITOR) 20 MG tablet Take 20 mg by mouth daily.    . budesonide-formoterol (SYMBICORT) 80-4.5 MCG/ACT inhaler Inhale 2 puffs into the lungs 2 (two) times daily.    Marland Kitchen  Cholecalciferol (VITAMIN D3) 5000 units CAPS Take 1 capsule by mouth daily.    . cyanocobalamin 500 MCG tablet Take 500 mcg by mouth daily.    . Exenatide ER (BYDUREON) 2 MG PEN Inject 2 mg into the skin every Saturday. Saturday     . ferrous sulfate 325 (65 FE) MG tablet Take 1 tablet (325 mg total) by mouth 3 (three) times daily with meals. 90 tablet 3  . ipratropium-albuterol (DUONEB) 0.5-2.5 (3) MG/3ML SOLN Inhale 3 mLs into the lungs every 6 (six) hours as needed (for shortness of breath).   5  .  loratadine (CLARITIN) 10 MG tablet Take 10 mg by mouth daily.    . metolazone (ZAROXOLYN) 2.5 MG tablet Take 1 tablet (2.5 mg total) by mouth as needed (3 lb weight gain overnight or 5lb week gain in a week). As directed by HF clinic. 10 tablet 3  . metoprolol succinate (TOPROL-XL) 25 MG 24 hr tablet Take 1 tablet (25 mg total) by mouth daily. 60 tablet 3  . Multiple Vitamin (MULTIVITAMIN WITH MINERALS) TABS tablet Take 1 tablet by mouth daily. Reported on 07/09/2015    . potassium chloride SA (K-DUR,KLOR-CON) 20 MEQ tablet Take 2 tablets (40 mEq total) by mouth 2 (two) times daily. Take an extra 40 meq when you take Metolazone 130 tablet 6  . torsemide (DEMADEX) 20 MG tablet Take 4 tablets (80 mg total) by mouth 2 (two) times daily. 240 tablet 6  . vitamin C (ASCORBIC ACID) 500 MG tablet Take 1 tablet (500 mg total) by mouth 2 (two) times daily. 60 tablet 0  . XARELTO 15 MG TABS tablet TAKE 1 TABLET BY MOUTH EVERY DAY WITH SUPPER 30 tablet 6   No current facility-administered medications for this encounter.    Pulse 64   Wt (!) 358 lb (162.4 kg)   SpO2 98%   BMI 61.45 kg/m    Wt Readings from Last 3 Encounters:  02/27/16 (!) 358 lb (162.4 kg)  02/11/16 (!) 351 lb (159.2 kg)  01/16/16 (!) 338 lb 12.8 oz (153.7 kg)     General: NAD, obese, in electric scooter..  Neck: JVP to ear.  No thyromegaly or thyroid nodule.  Lungs: Diminished basilar sounds.  CV: Nondisplaced PMI.  Heart irregular S1/S2, no S3/S4, 2/6 SEM RUSB with clear S2. Chronic woody edema in bilateral extremities. 2+ edema up into thighs. No carotid bruit.  Normal pedal pulses. CRT-P site stable. Abdomen: Morbidly Obese, soft, NT, ND, no HSM. No bruits or masses. +BS Skin: Intact without lesions or rashes.  Neurologic: Alert and oriented x 3.  Psych: Normal affect. Extremities: No clubbing or cyanosis.  HEENT: Normal.   Assessment/Plan: 1. Chronic systolic CHF: Suspect nonischemic.  EF 40-45% on 2/17 echo. Recent upgrade  to Medtronic CRT given constant RV pacing.  - Volume status markedly elevated. Weight up 30 lbs since August.  - Offered admission today, but she would prefer to go home tonight and come in tomorrow once bed available.  Will arrange. - NYHA Class III - IIIb symptoms.   - Will diurese with IV lasix 80 mg TID to start on admission.  Will likely need to add metolazone. Will avoid lasix gtt for now with IV fluid shortage.   torsemide 80 BID and KCl 40 bid.  - ContinueToprol XL 25 mg bid. No ACEIARB with elevated creatinine.  - No room to up-titrate with soft BP 2. Symptomatic bradycardia: MDT PPM.  Upgraded to CRT given high percentage RV  pacing.  3. CKD Stage IV - Urine output down.  - She had labs drawn earlier today and multiple bruises on her arm.  Will draw labs once she is admitted tomorrow. - She has now established with Dr. Hollie Salk in Nephrology 4. Chronic atrial fibrillation:  - Remains on Toprol XL and Xarelto. 5. OHS/OSA: - Continue nightly CPAP. She is bringing her CPAP to her admission tomorrow.   6. Anemia: Per PCP/GI. - Hgb 7.1 today. GI following. Has been as low as 6.4 recently.  - States she has occasional hemorrhoidal bleeding. Stools chronically black with oral iron. 7. Chronic venous stasis - With significant bilateral venous stasis changes. Makes volume status difficult. 8. Morbid obesity - Pt very deconditioned along with very poor diet.   9. Disability - Currently out of work.   Pt is markedly volume overloaded. Will plan on directly admitting her to hospital tomorrow for IV diuresis. Suspect she will require 4-5 das of diuresis.  She continues to have poor dietary compliance. She monitors her weight, but only seldomly takes metolazone as directed for weight gain.  Overall she has poor prognosis. She has very limited options for advanced therapies with her CKD and marked obesity.  Satira Mccallum Edita Weyenberg PA-C 02/27/2016

## 2016-02-27 NOTE — H&P (Signed)
Advanced Heart Failure Team History and Physical Note   PCP: Dr. Marveen Reeks [facility]) Pulmonary: Dr Donovan Kail  Cardiology: Dr. Aundra Dubin  Reason for Admission: A/C Diastolic CHF  HPI:    Robyn Porter is a 65 y.o. female chronic diastolic CHF, chronic afib on Xarelto, chronic anemia due to renal failure, Hx of GI bleeds, and OHS/OSA.   Previously admitted to [facility] in 05/2015 and diuresed 70 lbs.   Upgraded to CRT-P in 08/2015. Diuresed afterward discharged weight 363 lbs.  Admitted 8/2 - 12/11/15, diuresed with CVPs. Discharge weight 333 lbs.  She presented for regular follow up 02/27/16 and was noted to be up 20 lbs from last visit in September, nearly 30 lbs since discharge in August. Despite gradual uptrend in weight, she had only taken 2 metolazone in the past 2 months, and continued to have marked dietary indiscretion, including hot dogs.  CBC drawn on 02/27/16 showed Hgb 7.1. Pt did blood streaking on toilet paper, with hx of hemorrhoids. No Melena noted, although stools chronically black with Iron. History also + for fall over the weekend. She denied injury, but EMS had to come help her out of floor.  She gets SOB with bathing or changing clothes. Wears CPAP most nights.   Review of Systems: [y] = yes, [ ]  = no   General: Weight gain [y]; Weight loss [ ] ; Anorexia [ ] ; Fatigue Blue.Reese ]; Fever [ ] ; Chills [ ] ; Weakness [y]  Cardiac: Chest pain/pressure [ ] ; Resting SOB [ ] ; Exertional SOB [y]; Orthopnea [y]; Pedal Edema [y]; Palpitations [ ] ; Syncope [ ] ; Presyncope [ ] ; Paroxysmal nocturnal dyspnea[ ]   Pulmonary: Cough [ ] ; Wheezing[ ] ; Hemoptysis[ ] ; Sputum [ ] ; Snoring [ ]   GI: Vomiting[ ] ; Dysphagia[ ] ; Melena[ ] ; Hematochezia [ ] ; Heartburn[ ] ; Abdominal pain [ ] ; Constipation [ ] ; Diarrhea [ ] ; BRBPR [ ]   GU: Hematuria[ ] ; Dysuria [ ] ; Nocturia[ ]   Vascular: Pain in legs with walking [ ] ; Pain in feet with lying flat [ ] ; Non-healing sores [ ] ; Stroke [ ] ; TIA [ ] ; Slurred speech [ ] ;    Neuro: Headaches[ ] ; Vertigo[ ] ; Seizures[ ] ; Paresthesias[ ] ;Blurred vision [ ] ; Diplopia [ ] ; Vision changes [ ]   Ortho/Skin: Arthritis [y]; Joint pain [y]; Muscle pain [ ] ; Joint swelling [ ] ; Back Pain [ ] ; Rash [ ]   Psych: Depression[ ] ; Anxiety[ ]   Heme: Bleeding problems [ ] ; Clotting disorders [ ] ; Anemia [y]  Endocrine: Diabetes [y]; Thyroid dysfunction[ ]    Home Medications Prior to Admission medications   Medication Sig Start Date End Date Taking? Authorizing Provider  acetaminophen (TYLENOL) 325 MG tablet Take 1-2 tablets (325-650 mg total) by mouth every 6 (six) hours as needed for mild pain. 09/21/15   Shirley Friar, PA-C  albuterol (PROVENTIL HFA;VENTOLIN HFA) 108 (90 Base) MCG/ACT inhaler Inhale 1 puff into the lungs every 6 (six) hours as needed for wheezing or shortness of breath.    Historical Provider, MD  albuterol (PROVENTIL) (2.5 MG/3ML) 0.083% nebulizer solution Take 2.5 mg by nebulization every 8 (eight) hours as needed for wheezing.  05/22/15   Historical Provider, MD  allopurinol (ZYLOPRIM) 100 MG tablet Take 50 mg by mouth daily.     Historical Provider, MD  atorvastatin (LIPITOR) 20 MG tablet Take 20 mg by mouth daily.    Historical Provider, MD  budesonide-formoterol (SYMBICORT) 80-4.5 MCG/ACT inhaler Inhale 2 puffs into the lungs 2 (two) times daily.    Historical Provider, MD  Cholecalciferol (VITAMIN D3) 5000 units CAPS Take 1 capsule by mouth daily.    Historical Provider, MD  cyanocobalamin 500 MCG tablet Take 500 mcg by mouth daily.    Historical Provider, MD  Exenatide ER (BYDUREON) 2 MG PEN Inject 2 mg into the skin every Saturday. Saturday     Historical Provider, MD  ferrous sulfate 325 (65 FE) MG tablet Take 1 tablet (325 mg total) by mouth 3 (three) times daily with meals. 12/29/15   Orson Eva, MD  ipratropium-albuterol (DUONEB) 0.5-2.5 (3) MG/3ML SOLN Inhale 3 mLs into the lungs every 6 (six) hours as needed (for shortness of breath).  09/15/15    Historical Provider, MD  loratadine (CLARITIN) 10 MG tablet Take 10 mg by mouth daily.    Historical Provider, MD  metolazone (ZAROXOLYN) 2.5 MG tablet Take 1 tablet (2.5 mg total) by mouth as needed (3 lb weight gain overnight or 5lb week gain in a week). As directed by HF clinic. 12/11/15   Shirley Friar, PA-C  metoprolol succinate (TOPROL-XL) 25 MG 24 hr tablet Take 1 tablet (25 mg total) by mouth daily. 12/12/15   Larey Dresser, MD  Multiple Vitamin (MULTIVITAMIN WITH MINERALS) TABS tablet Take 1 tablet by mouth daily. Reported on 07/09/2015    Historical Provider, MD  potassium chloride SA (K-DUR,KLOR-CON) 20 MEQ tablet Take 2 tablets (40 mEq total) by mouth 2 (two) times daily. Take an extra 40 meq when you take Metolazone 12/11/15   Shirley Friar, PA-C  torsemide (DEMADEX) 20 MG tablet Take 4 tablets (80 mg total) by mouth 2 (two) times daily. 12/11/15   Shirley Friar, PA-C  vitamin C (ASCORBIC ACID) 500 MG tablet Take 1 tablet (500 mg total) by mouth 2 (two) times daily. 12/29/15   Orson Eva, MD  XARELTO 15 MG TABS tablet TAKE 1 TABLET BY MOUTH EVERY DAY WITH SUPPER 02/19/16   Larey Dresser, MD    Past Medical History: Past Medical History:  Diagnosis Date  . Afib ([place])   . Aortic stenosis   . CHF (congestive heart failure) ([place])   . Chronic kidney disease    Stage 3  . COPD (chronic obstructive pulmonary disease) ([place])   . Diabetes mellitus without complication ([place])   . Hypertension   . Pacemaker     Past Surgical History: Past Surgical History:  Procedure Laterality Date  . ABDOMINAL HYSTERECTOMY    . BIV PACEMAKER GENERATOR CHANGE OUT  09/19/2015  . COLONOSCOPY N/A 06/29/2015   Procedure: COLONOSCOPY;  Surgeon: Irene Shipper, MD;  Location: [facility] ENDOSCOPY;  Service: Endoscopy;  Laterality: N/A;  . COLONOSCOPY N/A 12/03/2015   Procedure: COLONOSCOPY;  Surgeon: Gatha Mayer, MD;  Location: [place];  Service: Endoscopy;  Laterality: N/A;  . EP  IMPLANTABLE DEVICE N/A 09/19/2015   Procedure: BiV Pacemaker Insertion CRT-P;  Surgeon: Will [name], MD;  Location: [place] [facility];  Service: Cardiovascular;  Laterality: N/A;  . ESOPHAGOGASTRODUODENOSCOPY N/A 11/29/2015   Procedure: ESOPHAGOGASTRODUODENOSCOPY (EGD);  Surgeon: Manus Gunning, MD;  Location: [place];  Service: Gastroenterology;  Laterality: N/A;  . GIVENS CAPSULE STUDY N/A 11/30/2015   Procedure: GIVENS CAPSULE STUDY;  Surgeon: Manus Gunning, MD;  Location: [place];  Service: Gastroenterology;  Laterality: N/A;  . HOT HEMOSTASIS N/A 12/03/2015   Procedure: HOT HEMOSTASIS (ARGON PLASMA COAGULATION/BICAP);  Surgeon: Gatha Mayer, MD;  Location: [facility] ENDOSCOPY;  Service: Endoscopy;  Laterality: N/A;  . PACEMAKER INSERTION  Family History: Family History  Problem Relation Age of Onset  . Angina Sister   . Emphysema Sister   . Colon cancer Brother     Social History: Social History   Social History  . Marital status: Single    Spouse name: N/A  . Number of children: N/A  . Years of education: N/A   Occupational History  . retired - business    Social History Main Topics  . Smoking status: Passive Smoke Exposure - Never Smoker  . Smokeless tobacco: Never Used  . Alcohol use No  . Drug use: No  . Sexual activity: No   Other Topics Concern  . Not on file   Social History Narrative  . No narrative on file    Allergies:  Allergies  Allergen Reactions  . Adhesive [Tape] Other (See Comments)    Tears skin.  Please use "paper" tape  . Amoxicillin Hives  . Contrast Media [Iodinated Diagnostic Agents] Hives  . Shellfish-Derived Products Hives    Can eat crab cakes  . Benadryl [Diphenhydramine Hcl] Rash    Objective:    Vital Signs:   BP 104/64 (Doppler) Pulse 64   Wt 358 lb (162.4 kg)   SpO2 98%   BMI 61.45 kg/m    Physical Exam: General: NAD, obese.  Neck: JVP to ear.  No thyromegaly or thyroid nodule.  Lungs:  Diminished basilar sounds.  CV: Nondisplaced PMI.  Heart irregular S1/S2, no S3/S4, 2/6 SEM RUSB with clear S2. Chronic woody edema in bilateral extremities. 3+ edema up into thighs. No carotid bruit.  Normal pedal pulses. CRT-P site stable. Abdomen: Morbidly Obese, soft, NT, ND, no HSM. No bruits or masses. +BS Skin: Intact without lesions or rashes.  Neurologic: Alert and oriented x 3.  Psych: Normal affect. Extremities: No clubbing or cyanosis.  HEENT: Normal.   Telemetry: Not yet connected.  Labs: Basic Metabolic Panel: No results for input(s): NA, K, CL, CO2, GLUCOSE, BUN, CREATININE, CALCIUM, MG, PHOS in the last 168 hours.  Liver Function Tests: No results for input(s): AST, ALT, ALKPHOS, BILITOT, PROT, ALBUMIN in the last 168 hours. No results for input(s): LIPASE, AMYLASE in the last 168 hours. No results for input(s): AMMONIA in the last 168 hours.  CBC:  Recent Labs Lab 02/27/16 1437  WBC 3.5*  NEUTROABS 2.1  HGB 7.1*  HCT 23.5*  MCV 107.2*  PLT 135*    Cardiac Enzymes: No results for input(s): CKTOTAL, CKMB, CKMBINDEX, TROPONINI in the last 168 hours.  BNP: BNP (last 3 results)  Recent Labs  06/12/15 2328 07/09/15 1158 07/19/15 1124  BNP 940.9* 133.0* 149.0*    ProBNP (last 3 results) No results for input(s): PROBNP in the last 8760 hours.   CBG: No results for input(s): GLUCAP in the last 168 hours.  Coagulation Studies: No results for input(s): LABPROT, INR in the last 72 hours.   Imaging: No results found.   Assessment/Plan   Robyn Porter is a 65 y.o. female with history with chronic systolic CHF, Anemia, OHS/OSA, symptomatic bradycardia s/p MDT PPm with CRT-P upgrade given high percentage of RV pacing, CKD stage IV.  Presented to HF clinic 02/27/16 with marked volume overload.  1. Chronic systolic CHF: Suspect nonischemic.  EF 40-45% on 2/17 echo. Recent upgrade to Medtronic CRT given constant RV pacing.  - Volume status markedly  elevated. Weight up 30 lbs since August.  - Offered admission 02/27/16 but pt wished to wait until 03/26/2016, so she could get her  personal items from home. - NYHA Class III - IIIb symptoms.   - Will diurese with IV lasix 80 mg TID to start on admission.  Will likely need to add metolazone. Will avoid lasix gtt for now with IV fluid shortage.  - Will place PICC line for CVP with history of very difficult to assess volume status.  - ContinueToprol XL 25 mg bid. No ACEIARB with elevated creatinine.  - No room to up-titrate with soft BP 2. Symptomatic bradycardia: MDT PPM.  Upgraded to CRT given high percentage RV pacing.  3. CKD Stage IV - Urine output down.  - She had labs drawn earlier today and multiple bruises on her arm.  Will draw labs once she is admitted tomorrow. - She has now established with Dr. Hollie Salk in Nephrology. May need to consult Renal in house if diuresis difficult or creatinine markedly elevated. 4. Chronic atrial fibrillation:  - Remains on Toprol XL and Xarelto. 5. OHS/OSA: - Continue nightly CPAP. She is to bring her CPAP from home.  6. Anemia: Per PCP/GI. - Hgb 7.1 02/27/16. GI sees as outpatient. Has been as low as 6.4 recently.. Stools chronically black with oral iron.   - States she has occasional hemorrhoidal bleed 7. Chronic venous stasis - With significant bilateral venous stasis changes. Makes volume status difficult. 8. Morbid obesity - Pt very deconditioned along with very poor diet.   9. Disability - Currently out of work.   Markedly volume overloaded. Plan to diuresis with IV lasix 80 mg IV TID to start. Have ordered PICC line for CVPs. May need to consider GI or Renal consults as needed.   Length of Stay: 0  Shirley Friar, PA-C 02/27/2016  Advanced Heart Failure Team Pager [phone] (M-F; 7a - 4p)  Please contact [facility] for night-coverage after hours (4p -7a ) and weekends on [email or web address]   Patient seen and examined with Oda Kilts,  PA-C. We discussed all aspects of the encounter. I agree with the assessment and plan as stated above.   She has recurrent massive volume overload in setting of severe dietary non-compliance. She has not responded well to increases in outpatient regimen. Admit for IV diuresis. Place PICC line to follow CVP and co-ox. May be nearing point where HD is the best option for her.  Buna Cuppett,MD 10:14 PM

## 2016-02-27 NOTE — Progress Notes (Signed)
Bed request for stepdown completed for patient for direct admit from home tomorrow. Patient aware that she will be notified by phone tomorrow when bed is assigned.  Renee Pain, RN

## 2016-02-28 ENCOUNTER — Encounter (HOSPITAL_COMMUNITY): Payer: Self-pay

## 2016-02-28 ENCOUNTER — Inpatient Hospital Stay (HOSPITAL_COMMUNITY): Payer: Medicare Other

## 2016-02-28 ENCOUNTER — Inpatient Hospital Stay (HOSPITAL_COMMUNITY)
Admission: RE | Admit: 2016-02-28 | Discharge: 2016-03-28 | DRG: 291 | Disposition: E | Payer: Medicare Other | Source: Ambulatory Visit | Attending: Internal Medicine | Admitting: Internal Medicine

## 2016-02-28 DIAGNOSIS — K922 Gastrointestinal hemorrhage, unspecified: Secondary | ICD-10-CM | POA: Diagnosis present

## 2016-02-28 DIAGNOSIS — E872 Acidosis: Secondary | ICD-10-CM | POA: Diagnosis not present

## 2016-02-28 DIAGNOSIS — R579 Shock, unspecified: Secondary | ICD-10-CM | POA: Diagnosis not present

## 2016-02-28 DIAGNOSIS — J9601 Acute respiratory failure with hypoxia: Secondary | ICD-10-CM | POA: Diagnosis not present

## 2016-02-28 DIAGNOSIS — R34 Anuria and oliguria: Secondary | ICD-10-CM | POA: Diagnosis not present

## 2016-02-28 DIAGNOSIS — Q2733 Arteriovenous malformation of digestive system vessel: Secondary | ICD-10-CM

## 2016-02-28 DIAGNOSIS — Z881 Allergy status to other antibiotic agents status: Secondary | ICD-10-CM

## 2016-02-28 DIAGNOSIS — Z950 Presence of cardiac pacemaker: Secondary | ICD-10-CM

## 2016-02-28 DIAGNOSIS — R578 Other shock: Secondary | ICD-10-CM | POA: Diagnosis not present

## 2016-02-28 DIAGNOSIS — I130 Hypertensive heart and chronic kidney disease with heart failure and stage 1 through stage 4 chronic kidney disease, or unspecified chronic kidney disease: Secondary | ICD-10-CM | POA: Diagnosis present

## 2016-02-28 DIAGNOSIS — N179 Acute kidney failure, unspecified: Secondary | ICD-10-CM | POA: Diagnosis not present

## 2016-02-28 DIAGNOSIS — I482 Chronic atrial fibrillation: Secondary | ICD-10-CM | POA: Diagnosis not present

## 2016-02-28 DIAGNOSIS — K661 Hemoperitoneum: Secondary | ICD-10-CM | POA: Diagnosis not present

## 2016-02-28 DIAGNOSIS — Z452 Encounter for adjustment and management of vascular access device: Secondary | ICD-10-CM

## 2016-02-28 DIAGNOSIS — R570 Cardiogenic shock: Secondary | ICD-10-CM | POA: Diagnosis present

## 2016-02-28 DIAGNOSIS — I5033 Acute on chronic diastolic (congestive) heart failure: Secondary | ICD-10-CM

## 2016-02-28 DIAGNOSIS — E662 Morbid (severe) obesity with alveolar hypoventilation: Secondary | ICD-10-CM | POA: Diagnosis present

## 2016-02-28 DIAGNOSIS — N184 Chronic kidney disease, stage 4 (severe): Secondary | ICD-10-CM | POA: Diagnosis not present

## 2016-02-28 DIAGNOSIS — I878 Other specified disorders of veins: Secondary | ICD-10-CM | POA: Diagnosis present

## 2016-02-28 DIAGNOSIS — R58 Hemorrhage, not elsewhere classified: Secondary | ICD-10-CM

## 2016-02-28 DIAGNOSIS — D62 Acute posthemorrhagic anemia: Secondary | ICD-10-CM | POA: Diagnosis not present

## 2016-02-28 DIAGNOSIS — L89152 Pressure ulcer of sacral region, stage 2: Secondary | ICD-10-CM | POA: Diagnosis not present

## 2016-02-28 DIAGNOSIS — R609 Edema, unspecified: Secondary | ICD-10-CM | POA: Diagnosis not present

## 2016-02-28 DIAGNOSIS — Z6841 Body Mass Index (BMI) 40.0 and over, adult: Secondary | ICD-10-CM | POA: Diagnosis not present

## 2016-02-28 DIAGNOSIS — Z91013 Allergy to seafood: Secondary | ICD-10-CM

## 2016-02-28 DIAGNOSIS — Z7951 Long term (current) use of inhaled steroids: Secondary | ICD-10-CM

## 2016-02-28 DIAGNOSIS — G9340 Encephalopathy, unspecified: Secondary | ICD-10-CM | POA: Diagnosis not present

## 2016-02-28 DIAGNOSIS — I5043 Acute on chronic combined systolic (congestive) and diastolic (congestive) heart failure: Secondary | ICD-10-CM | POA: Diagnosis present

## 2016-02-28 DIAGNOSIS — E889 Metabolic disorder, unspecified: Secondary | ICD-10-CM | POA: Diagnosis present

## 2016-02-28 DIAGNOSIS — E871 Hypo-osmolality and hyponatremia: Secondary | ICD-10-CM | POA: Diagnosis not present

## 2016-02-28 DIAGNOSIS — I959 Hypotension, unspecified: Secondary | ICD-10-CM

## 2016-02-28 DIAGNOSIS — E1122 Type 2 diabetes mellitus with diabetic chronic kidney disease: Secondary | ICD-10-CM | POA: Diagnosis present

## 2016-02-28 DIAGNOSIS — E11649 Type 2 diabetes mellitus with hypoglycemia without coma: Secondary | ICD-10-CM | POA: Diagnosis not present

## 2016-02-28 DIAGNOSIS — Q8789 Other specified congenital malformation syndromes, not elsewhere classified: Secondary | ICD-10-CM

## 2016-02-28 DIAGNOSIS — E611 Iron deficiency: Secondary | ICD-10-CM | POA: Diagnosis present

## 2016-02-28 DIAGNOSIS — Z66 Do not resuscitate: Secondary | ICD-10-CM | POA: Diagnosis not present

## 2016-02-28 DIAGNOSIS — Z4659 Encounter for fitting and adjustment of other gastrointestinal appliance and device: Secondary | ICD-10-CM

## 2016-02-28 DIAGNOSIS — Z888 Allergy status to other drugs, medicaments and biological substances status: Secondary | ICD-10-CM

## 2016-02-28 DIAGNOSIS — I472 Ventricular tachycardia: Secondary | ICD-10-CM | POA: Diagnosis not present

## 2016-02-28 DIAGNOSIS — K649 Unspecified hemorrhoids: Secondary | ICD-10-CM | POA: Diagnosis present

## 2016-02-28 DIAGNOSIS — L8990 Pressure ulcer of unspecified site, unspecified stage: Secondary | ICD-10-CM | POA: Insufficient documentation

## 2016-02-28 DIAGNOSIS — R55 Syncope and collapse: Secondary | ICD-10-CM | POA: Diagnosis not present

## 2016-02-28 DIAGNOSIS — H532 Diplopia: Secondary | ICD-10-CM | POA: Diagnosis not present

## 2016-02-28 DIAGNOSIS — E875 Hyperkalemia: Secondary | ICD-10-CM | POA: Diagnosis not present

## 2016-02-28 DIAGNOSIS — D631 Anemia in chronic kidney disease: Secondary | ICD-10-CM | POA: Diagnosis present

## 2016-02-28 DIAGNOSIS — N170 Acute kidney failure with tubular necrosis: Secondary | ICD-10-CM | POA: Diagnosis not present

## 2016-02-28 DIAGNOSIS — I493 Ventricular premature depolarization: Secondary | ICD-10-CM | POA: Diagnosis not present

## 2016-02-28 DIAGNOSIS — G459 Transient cerebral ischemic attack, unspecified: Secondary | ICD-10-CM | POA: Diagnosis not present

## 2016-02-28 DIAGNOSIS — E876 Hypokalemia: Secondary | ICD-10-CM | POA: Diagnosis not present

## 2016-02-28 DIAGNOSIS — I352 Nonrheumatic aortic (valve) stenosis with insufficiency: Secondary | ICD-10-CM | POA: Diagnosis present

## 2016-02-28 DIAGNOSIS — E861 Hypovolemia: Secondary | ICD-10-CM | POA: Diagnosis not present

## 2016-02-28 DIAGNOSIS — E8770 Fluid overload, unspecified: Secondary | ICD-10-CM | POA: Diagnosis present

## 2016-02-28 DIAGNOSIS — J449 Chronic obstructive pulmonary disease, unspecified: Secondary | ICD-10-CM | POA: Diagnosis present

## 2016-02-28 DIAGNOSIS — Z7901 Long term (current) use of anticoagulants: Secondary | ICD-10-CM

## 2016-02-28 DIAGNOSIS — I639 Cerebral infarction, unspecified: Secondary | ICD-10-CM

## 2016-02-28 DIAGNOSIS — Z9111 Patient's noncompliance with dietary regimen: Secondary | ICD-10-CM

## 2016-02-28 DIAGNOSIS — Z9911 Dependence on respirator [ventilator] status: Secondary | ICD-10-CM

## 2016-02-28 DIAGNOSIS — Z91041 Radiographic dye allergy status: Secondary | ICD-10-CM

## 2016-02-28 DIAGNOSIS — Z91048 Other nonmedicinal substance allergy status: Secondary | ICD-10-CM

## 2016-02-28 LAB — COMPREHENSIVE METABOLIC PANEL
ALT: 13 U/L — AB (ref 14–54)
ANION GAP: 11 (ref 5–15)
AST: 25 U/L (ref 15–41)
Albumin: 2.6 g/dL — ABNORMAL LOW (ref 3.5–5.0)
Alkaline Phosphatase: 66 U/L (ref 38–126)
BUN: 68 mg/dL — ABNORMAL HIGH (ref 6–20)
CHLORIDE: 106 mmol/L (ref 101–111)
CO2: 23 mmol/L (ref 22–32)
CREATININE: 2.76 mg/dL — AB (ref 0.44–1.00)
Calcium: 9.2 mg/dL (ref 8.9–10.3)
GFR, EST AFRICAN AMERICAN: 19 mL/min — AB (ref >60)
GFR, EST NON AFRICAN AMERICAN: 17 mL/min — AB (ref >60)
Glucose, Bld: 118 mg/dL — ABNORMAL HIGH (ref 65–99)
POTASSIUM: 4.6 mmol/L (ref 3.5–5.1)
SODIUM: 140 mmol/L (ref 135–145)
Total Bilirubin: 1.4 mg/dL — ABNORMAL HIGH (ref 0.3–1.2)
Total Protein: 6.5 g/dL (ref 6.5–8.1)

## 2016-02-28 LAB — CBC WITH DIFFERENTIAL/PLATELET
Basophils Absolute: 0 10*3/uL (ref 0.0–0.1)
Basophils Relative: 0 %
Eosinophils Absolute: 0.2 10*3/uL (ref 0.0–0.7)
Eosinophils Relative: 4 %
HEMATOCRIT: 24.5 % — AB (ref 36.0–46.0)
HEMOGLOBIN: 7.1 g/dL — AB (ref 12.0–15.0)
LYMPHS ABS: 0.9 10*3/uL (ref 0.7–4.0)
LYMPHS PCT: 25 %
MCH: 31.7 pg (ref 26.0–34.0)
MCHC: 29 g/dL — AB (ref 30.0–36.0)
MCV: 109.4 fL — AB (ref 78.0–100.0)
MONOS PCT: 12 %
Monocytes Absolute: 0.4 10*3/uL (ref 0.1–1.0)
NEUTROS ABS: 2.2 10*3/uL (ref 1.7–7.7)
NEUTROS PCT: 59 %
Platelets: 138 10*3/uL — ABNORMAL LOW (ref 150–400)
RBC: 2.24 MIL/uL — AB (ref 3.87–5.11)
RDW: 19.5 % — ABNORMAL HIGH (ref 11.5–15.5)
WBC: 3.7 10*3/uL — AB (ref 4.0–10.5)

## 2016-02-28 LAB — MAGNESIUM: MAGNESIUM: 2.2 mg/dL (ref 1.7–2.4)

## 2016-02-28 LAB — MRSA PCR SCREENING: MRSA by PCR: NEGATIVE

## 2016-02-28 LAB — BRAIN NATRIURETIC PEPTIDE: B Natriuretic Peptide: 429.4 pg/mL — ABNORMAL HIGH (ref 0.0–100.0)

## 2016-02-28 MED ORDER — ONDANSETRON HCL 4 MG/2ML IJ SOLN
4.0000 mg | Freq: Four times a day (QID) | INTRAMUSCULAR | Status: DC | PRN
  Administered 2016-03-10 – 2016-03-14 (×5): 4 mg via INTRAVENOUS
  Filled 2016-02-28 (×5): qty 2

## 2016-02-28 MED ORDER — ALBUTEROL SULFATE HFA 108 (90 BASE) MCG/ACT IN AERS: 1.0000 | INHALATION_SPRAY | Freq: Four times a day (QID) | RESPIRATORY_TRACT | Status: DC | PRN

## 2016-02-28 MED ORDER — FERROUS SULFATE 325 (65 FE) MG PO TABS
325.0000 mg | ORAL_TABLET | Freq: Three times a day (TID) | ORAL | Status: DC
  Administered 2016-02-29 – 2016-03-03 (×12): 325 mg via ORAL
  Filled 2016-02-28 (×13): qty 1

## 2016-02-28 MED ORDER — METOPROLOL SUCCINATE ER 25 MG PO TB24: 25.0000 mg | ORAL_TABLET | Freq: Every day | ORAL | Status: DC

## 2016-02-28 MED ORDER — ADULT MULTIVITAMIN W/MINERALS CH
1.0000 | ORAL_TABLET | Freq: Every day | ORAL | Status: DC
  Administered 2016-02-29 – 2016-03-16 (×16): 1 via ORAL
  Filled 2016-02-28 (×18): qty 1

## 2016-02-28 MED ORDER — ALLOPURINOL 100 MG PO TABS
50.0000 mg | ORAL_TABLET | Freq: Every day | ORAL | Status: DC
  Administered 2016-02-29 – 2016-03-13 (×14): 50 mg via ORAL
  Filled 2016-02-28 (×14): qty 1

## 2016-02-28 MED ORDER — SODIUM CHLORIDE 0.9 % IV SOLN: 250.0000 mL | INTRAVENOUS | Status: DC | PRN

## 2016-02-28 MED ORDER — SODIUM CHLORIDE 0.9% FLUSH
10.0000 mL | INTRAVENOUS | Status: DC | PRN
Start: 2016-02-28 — End: 2016-03-17

## 2016-02-28 MED ORDER — ACETAMINOPHEN 325 MG PO TABS
325.0000 mg | ORAL_TABLET | Freq: Four times a day (QID) | ORAL | Status: DC | PRN
  Administered 2016-02-29 – 2016-03-12 (×19): 650 mg via ORAL
  Filled 2016-02-28 (×19): qty 2

## 2016-02-28 MED ORDER — ATORVASTATIN CALCIUM 20 MG PO TABS
20.0000 mg | ORAL_TABLET | Freq: Every day | ORAL | Status: DC
  Administered 2016-02-29 – 2016-03-13 (×13): 20 mg via ORAL
  Filled 2016-02-28 (×14): qty 1

## 2016-02-28 MED ORDER — EXENATIDE ER 2 MG ~~LOC~~ PEN: 2.0000 mg | PEN_INJECTOR | SUBCUTANEOUS | Status: DC

## 2016-02-28 MED ORDER — VITAMIN B-12 1000 MCG PO TABS
500.0000 ug | ORAL_TABLET | Freq: Every day | ORAL | Status: DC
  Administered 2016-02-29 – 2016-03-16 (×16): 500 ug via ORAL
  Filled 2016-02-28 (×15): qty 1
  Filled 2016-02-28: qty 0.5
  Filled 2016-02-28: qty 1

## 2016-02-28 MED ORDER — ALBUTEROL SULFATE (2.5 MG/3ML) 0.083% IN NEBU: 2.5000 mg | INHALATION_SOLUTION | Freq: Three times a day (TID) | RESPIRATORY_TRACT | Status: DC | PRN

## 2016-02-28 MED ORDER — RIVAROXABAN 15 MG PO TABS: 15.0000 mg | ORAL_TABLET | Freq: Every day | ORAL | Status: DC

## 2016-02-28 MED ORDER — IPRATROPIUM-ALBUTEROL 0.5-2.5 (3) MG/3ML IN SOLN: 3.0000 mL | Freq: Four times a day (QID) | RESPIRATORY_TRACT | Status: DC | PRN

## 2016-02-28 MED ORDER — LORATADINE 10 MG PO TABS
10.0000 mg | ORAL_TABLET | Freq: Every day | ORAL | Status: DC
  Administered 2016-02-29 – 2016-03-13 (×14): 10 mg via ORAL
  Filled 2016-02-28 (×14): qty 1

## 2016-02-28 MED ORDER — SODIUM CHLORIDE 0.9% FLUSH
10.0000 mL | Freq: Two times a day (BID) | INTRAVENOUS | Status: DC
  Administered 2016-02-28 – 2016-02-29 (×2): 10 mL
  Administered 2016-03-01: 20 mL
  Administered 2016-03-01 – 2016-03-15 (×24): 10 mL

## 2016-02-28 MED ORDER — VITAMIN C 500 MG PO TABS
500.0000 mg | ORAL_TABLET | Freq: Two times a day (BID) | ORAL | Status: DC
  Administered 2016-02-28 – 2016-03-16 (×31): 500 mg via ORAL
  Filled 2016-02-28 (×31): qty 1

## 2016-02-28 MED ORDER — MOMETASONE FURO-FORMOTEROL FUM 100-5 MCG/ACT IN AERO
2.0000 | INHALATION_SPRAY | Freq: Two times a day (BID) | RESPIRATORY_TRACT | Status: DC
  Administered 2016-02-29 – 2016-03-12 (×25): 2 via RESPIRATORY_TRACT
  Filled 2016-02-28: qty 8.8

## 2016-02-28 MED ORDER — VITAMIN D 1000 UNITS PO TABS
5000.0000 [IU] | ORAL_TABLET | Freq: Every day | ORAL | Status: DC
  Administered 2016-02-29 – 2016-03-16 (×16): 5000 [IU] via ORAL
  Filled 2016-02-28 (×17): qty 5

## 2016-02-28 MED ORDER — SODIUM CHLORIDE 0.9% FLUSH
3.0000 mL | Freq: Two times a day (BID) | INTRAVENOUS | Status: DC
  Administered 2016-02-28 – 2016-03-07 (×8): 3 mL via INTRAVENOUS

## 2016-02-28 MED ORDER — FUROSEMIDE 10 MG/ML IJ SOLN
80.0000 mg | Freq: Three times a day (TID) | INTRAMUSCULAR | Status: DC
  Administered 2016-02-28 – 2016-03-01 (×5): 80 mg via INTRAVENOUS
  Filled 2016-02-28 (×6): qty 8

## 2016-02-28 MED ORDER — SODIUM CHLORIDE 0.9% FLUSH: 3.0000 mL | INTRAVENOUS | Status: DC | PRN

## 2016-02-28 MED ORDER — POTASSIUM CHLORIDE CRYS ER 20 MEQ PO TBCR
40.0000 meq | EXTENDED_RELEASE_TABLET | Freq: Two times a day (BID) | ORAL | Status: DC
  Administered 2016-02-28: 40 meq via ORAL
  Filled 2016-02-28: qty 2

## 2016-02-28 NOTE — Progress Notes (Signed)
Peripherally Inserted Central Catheter/Midline Placement  The IV Nurse has discussed with the patient and/or persons authorized to consent for the patient, the purpose of this procedure and the potential benefits and risks involved with this procedure.  The benefits include less needle sticks, lab draws from the catheter, and the patient may be discharged home with the catheter. Risks include, but not limited to, infection, bleeding, blood clot (thrombus formation), and puncture of an artery; nerve damage and irregular heartbeat and possibility to perform a PICC exchange if needed/ordered by physician.  Alternatives to this procedure were also discussed.  Bard Power PICC patient education guide, fact sheet on infection prevention and patient information card has been provided to patient /or left at bedside.    PICC/Midline Placement Documentation        Teyon Odette, Nicolette Bang 03/24/2016, 9:41 PM

## 2016-02-29 ENCOUNTER — Encounter (HOSPITAL_COMMUNITY): Payer: Self-pay | Admitting: *Deleted

## 2016-02-29 DIAGNOSIS — N179 Acute kidney failure, unspecified: Secondary | ICD-10-CM

## 2016-02-29 LAB — COOXEMETRY PANEL
CARBOXYHEMOGLOBIN: 2.4 % — AB (ref 0.5–1.5)
Carboxyhemoglobin: 1.9 % — ABNORMAL HIGH (ref 0.5–1.5)
Carboxyhemoglobin: 2.3 % — ABNORMAL HIGH (ref 0.5–1.5)
Methemoglobin: 0.9 % (ref 0.0–1.5)
Methemoglobin: 0.9 % (ref 0.0–1.5)
Methemoglobin: 0.6 % (ref 0.0–1.5)
O2 SAT: 40.6 %
O2 SAT: 53.9 %
O2 SAT: 51.2 %
TOTAL HEMOGLOBIN: 7.1 g/dL — AB (ref 12.0–16.0)
Total hemoglobin: 7.4 g/dL — ABNORMAL LOW (ref 12.0–16.0)
Total hemoglobin: 7.9 g/dL — ABNORMAL LOW (ref 12.0–16.0)

## 2016-02-29 LAB — CBC WITH DIFFERENTIAL/PLATELET
BASOS PCT: 1 %
Basophils Absolute: 0 10*3/uL (ref 0.0–0.1)
EOS ABS: 0.2 10*3/uL (ref 0.0–0.7)
EOS PCT: 5 %
HCT: 22.4 % — ABNORMAL LOW (ref 36.0–46.0)
Hemoglobin: 6.6 g/dL — CL (ref 12.0–15.0)
LYMPHS ABS: 1 10*3/uL (ref 0.7–4.0)
Lymphocytes Relative: 29 %
MCH: 31.7 pg (ref 26.0–34.0)
MCHC: 29.5 g/dL — AB (ref 30.0–36.0)
MCV: 107.7 fL — ABNORMAL HIGH (ref 78.0–100.0)
MONO ABS: 0.5 10*3/uL (ref 0.1–1.0)
MONOS PCT: 16 %
Neutro Abs: 1.7 10*3/uL (ref 1.7–7.7)
Neutrophils Relative %: 49 %
Platelets: 138 10*3/uL — ABNORMAL LOW (ref 150–400)
RBC: 2.08 MIL/uL — ABNORMAL LOW (ref 3.87–5.11)
RDW: 19.3 % — AB (ref 11.5–15.5)
WBC: 3.5 10*3/uL — ABNORMAL LOW (ref 4.0–10.5)

## 2016-02-29 LAB — BASIC METABOLIC PANEL
Anion gap: 9 (ref 5–15)
BUN: 66 mg/dL — AB (ref 6–20)
CALCIUM: 9 mg/dL (ref 8.9–10.3)
CO2: 22 mmol/L (ref 22–32)
CREATININE: 2.6 mg/dL — AB (ref 0.44–1.00)
Chloride: 107 mmol/L (ref 101–111)
GFR calc non Af Amer: 18 mL/min — ABNORMAL LOW (ref >60)
GFR, EST AFRICAN AMERICAN: 21 mL/min — AB (ref >60)
GLUCOSE: 84 mg/dL (ref 65–99)
Potassium: 4.5 mmol/L (ref 3.5–5.1)
Sodium: 138 mmol/L (ref 135–145)

## 2016-02-29 LAB — IRON AND TIBC
Iron: 51 ug/dL (ref 28–170)
SATURATION RATIOS: 14 % (ref 10.4–31.8)
TIBC: 367 ug/dL (ref 250–450)
UIBC: 316 ug/dL

## 2016-02-29 LAB — RETICULOCYTES
RBC.: 1.98 MIL/uL — AB (ref 3.87–5.11)
Retic Count, Absolute: 114.8 10*3/uL (ref 19.0–186.0)
Retic Ct Pct: 5.8 % — ABNORMAL HIGH (ref 0.4–3.1)

## 2016-02-29 LAB — FOLATE: FOLATE: 23.5 ng/mL (ref >5.9)

## 2016-02-29 LAB — FERRITIN: Ferritin: 183 ng/mL (ref 11–307)

## 2016-02-29 LAB — VITAMIN B12: VITAMIN B 12: 1216 pg/mL — AB (ref 180–914)

## 2016-02-29 LAB — PREPARE RBC (CROSSMATCH)

## 2016-02-29 MED ORDER — MILRINONE LACTATE IN DEXTROSE 20-5 MG/100ML-% IV SOLN
0.2500 ug/kg/min | INTRAVENOUS | Status: DC
  Administered 2016-02-29 – 2016-03-07 (×21): 0.25 ug/kg/min via INTRAVENOUS
  Filled 2016-02-29 (×22): qty 100

## 2016-02-29 MED ORDER — METOLAZONE 5 MG PO TABS
5.0000 mg | ORAL_TABLET | Freq: Every day | ORAL | Status: DC
  Administered 2016-02-29 – 2016-03-01 (×2): 5 mg via ORAL
  Filled 2016-02-29 (×2): qty 1

## 2016-02-29 MED ORDER — POTASSIUM CHLORIDE CRYS ER 20 MEQ PO TBCR
40.0000 meq | EXTENDED_RELEASE_TABLET | Freq: Every day | ORAL | Status: DC
  Administered 2016-02-29 – 2016-03-01 (×2): 40 meq via ORAL
  Filled 2016-02-29 (×2): qty 2

## 2016-02-29 MED ORDER — SODIUM CHLORIDE 0.9 % IV SOLN
Freq: Once | INTRAVENOUS | Status: AC
  Administered 2016-02-29: 08:00:00 via INTRAVENOUS

## 2016-02-29 NOTE — Progress Notes (Signed)
Patient bleeding around insertion site of PICC.  Pressure dressing applied around PICC site to control bleeding.  Recommend wait 24 hours before changing,  Call IV team if bleeding continues

## 2016-02-29 NOTE — Progress Notes (Signed)
Advanced Heart Failure Rounding Note   Subjective:    Admitted with marked volume overload. Diuresing with IV lasix. Sluggish urine output. PICC placed.   Denies SOB/Orthopnea. Complains of fatigue.   CO-OX 41%.  Hgb 7.1>6.6 Creatinine 2.7>2.6    Objective:   Weight Range:  Vital Signs:   Temp:  [97.3 F (36.3 C)-97.7 F (36.5 C)] 97.4 F (36.3 C) (11/03 0348) Pulse Rate:  [59-66] 63 (11/03 0022) Resp:  [12-18] 12 (11/03 0348) BP: (95-110)/(42-62) 106/52 (11/03 0348) SpO2:  [98 %-100 %] 98 % (11/03 0348) Weight:  [402 lb (182.3 kg)] 402 lb (182.3 kg) (11/03 0348) Last BM Date: 02/29/16  Weight change: Filed Weights   02/29/16 0348  Weight: (!) 402 lb (182.3 kg)    Intake/Output:   Intake/Output Summary (Last 24 hours) at 02/29/16 0734 Last data filed at 02/29/16 0002  Gross per 24 hour  Intake                0 ml  Output              450 ml  Net             -450 ml     Physical Exam: CVP 27 General:  Chronically ill appearing No resp difficulty. In bed  HEENT: normal Neck: supple. JVP difficult to assess due to body habitus. Carotids 2+ bilat; no bruits. No lymphadenopathy or thryomegaly appreciated. Cor: PMI nondisplaced. Regular rate & rhythm. No rubs, gallops or murmurs. Lungs: Decreased in the bases.  Abdomen: obese, soft, nontender, nondistended. No hepatosplenomegaly. No bruits or masses. Good bowel sounds. Extremities: no cyanosis, clubbing, rash, R and LLE 3+ edema. RUE PICC Neuro: alert & orientedx3, cranial nerves grossly intact. moves all 4 extremities w/o difficulty. Affect pleasant  Telemetry: Vpaced 60s   Labs: Basic Metabolic Panel:  Recent Labs Lab 03/13/2016 1754 02/29/16 0520  NA 140 138  K 4.6 4.5  CL 106 107  CO2 23 22  GLUCOSE 118* 84  BUN 68* 66*  CREATININE 2.76* 2.60*  CALCIUM 9.2 9.0  MG 2.2  --     Liver Function Tests:  Recent Labs Lab 03/15/2016 1754  AST 25  ALT 13*  ALKPHOS 66  BILITOT 1.4*  PROT 6.5   ALBUMIN 2.6*   No results for input(s): LIPASE, AMYLASE in the last 168 hours. No results for input(s): AMMONIA in the last 168 hours.  CBC:  Recent Labs Lab 02/27/16 1437 03/08/2016 1754 02/29/16 0520  WBC 3.5* 3.7* 3.5*  NEUTROABS 2.1 2.2 1.7  HGB 7.1* 7.1* 6.6*  HCT 23.5* 24.5* 22.4*  MCV 107.2* 109.4* 107.7*  PLT 135* 138* 138*    Cardiac Enzymes: No results for input(s): CKTOTAL, CKMB, CKMBINDEX, TROPONINI in the last 168 hours.  BNP: BNP (last 3 results)  Recent Labs  07/09/15 1158 07/19/15 1124 03/25/2016 1754  BNP 133.0* 149.0* 429.4*    ProBNP (last 3 results) No results for input(s): PROBNP in the last 8760 hours.    Other results:  Imaging: Dg Chest Port 1 View  Result Date: 03/07/2016 CLINICAL DATA:  Confirm line placement. EXAM: PORTABLE CHEST 1 VIEW COMPARISON:  Chest radiograph November 29, 2015 FINDINGS: RIGHT PICC distal tip projects in proximal superior vena cava. The cardiac silhouette is moderately enlarged, mediastinal silhouette is nonsuspicious, mildly calcified aortic knob. Pulmonary vascular congestion. No pneumothorax. Dual lead LEFT cardiac pacemaker in situ. Soft tissue planes and included osseous structure nonsuspicious. IMPRESSION: RIGHT PICC distal tip  projects in proximal superior vena cava. Stable cardiomegaly and pulmonary vascular congestion. Electronically Signed   By: Elon Alas M.D.   On: 02/29/2016 21:58      Medications:     Scheduled Medications: . allopurinol  50 mg Oral Daily  . atorvastatin  20 mg Oral q1800  . cholecalciferol  5,000 Units Oral Daily  . ferrous sulfate  325 mg Oral TID WC  . furosemide  80 mg Intravenous TID  . loratadine  10 mg Oral Daily  . metoprolol succinate  25 mg Oral Daily  . mometasone-formoterol  2 puff Inhalation BID  . multivitamin with minerals  1 tablet Oral Daily  . potassium chloride SA  40 mEq Oral Daily  . Rivaroxaban  15 mg Oral Q supper  . sodium chloride flush  10-40 mL  Intracatheter Q12H  . sodium chloride flush  3 mL Intravenous Q12H  . cyanocobalamin  500 mcg Oral Daily  . vitamin C  500 mg Oral BID     Infusions:     PRN Medications:  sodium chloride, acetaminophen, albuterol, ipratropium-albuterol, ondansetron (ZOFRAN) IV, sodium chloride flush, sodium chloride flush   Assessment/Plan/Discussion    Robyn Porter is a 65 y.o. female with history with chronic systolic CHF, Anemia, OHS/OSA, symptomatic bradycardia s/p MDT PPm with CRT-P upgrade given high percentage of RV pacing, CKD stage IV.  Admitted with marked volume overload.  1. Chronic systolic CHF: Suspect nonischemic. EF 50-55% on 8/17 echo. Recent upgrade to Medtronic CRT given constant RV pacing.  - Volume status markedly elevated. Weight up 30 lbs since August.  - Todays CO-OX is 41% c/w cardiogenic shock physiology. Start milrinone 0.25 mcg.  - Continue IV lasix 80 mg TID and add 5 mg metolazone daily.  -Stop BB.  -No Ace/ARB Spiro with CKD.  - Repeat echo suspect RV failure may be major issue. Will likely need RHC at some point 2. Symptomatic bradycardia: MDT PPM. Upgraded to CRT given high percentage RV pacing.  3. Acute on CKD Stage IV- Creatinine baseline 2 -2.3 - Creatinine down a little today 2.7>2.6  - Will support with milrinone. Renal following.as outpatient.  4. Chronic atrial fibrillation:  - Hold Toprol XL today with low output.  Hgb down to 6.6 . Hold Xarelto for now. May need GI consult. Will transfuse 5. OHS/OSA: - Continue nightly CPAP. Continue CPAP from home.  6. 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 Hgb 6.6. Check Iron panel. Check FOBT. Give 1UPRBC - States she has occasional hemorrhoidal bleed 7. Chronic venous stasis - With significant bilateral venous stasis changes.  8. Morbid obesity - Deconditioned. Will need PT consult.   9. Disability - Currently out of work.     Length of Stay: 1   [name] NP-C  02/29/2016, 7:34 AM  Advanced Heart  Failure Team Pager [phone] (M-F; 7a - 4p)  Please contact [facility] for night-coverage after hours (4p -7a ) and weekends on [email or web address]  Patient seen and examined with Darrick Grinder, NP. We discussed all aspects of the encounter. I agree with the assessment and plan as stated above.   Hemodynamics suggested of cardiogenic shock with low output and elevated filling pressures. With relatively normal LVEF concern for RV failure. Will repeat echo. Start milrinone. Stop b-blocker.  Follow renal function closely. Will likely need RHC at some point. Agree with transfusion.  Keep legs wrapped for LE ulcerations.   Bensimhon, Daniel,MD 9:40 AM

## 2016-02-29 NOTE — Progress Notes (Signed)
CRITICAL VALUE ALERT  Critical value received:  Hgb 6.6  Date of notification:  02/29/16  Time of notification:  6:27am  Critical value read back:Yes.    Nurse who received alert:  Rich Reining  MD notified (1st page):  HF on call  Time of first page:  6:29am

## 2016-03-01 DIAGNOSIS — I482 Chronic atrial fibrillation: Secondary | ICD-10-CM

## 2016-03-01 LAB — CBC WITH DIFFERENTIAL/PLATELET
Basophils Absolute: 0 10*3/uL (ref 0.0–0.1)
Basophils Relative: 1 %
EOS PCT: 5 %
Eosinophils Absolute: 0.2 10*3/uL (ref 0.0–0.7)
HEMATOCRIT: 31.5 % — AB (ref 36.0–46.0)
HEMOGLOBIN: 9.6 g/dL — AB (ref 12.0–15.0)
LYMPHS ABS: 0.5 10*3/uL — AB (ref 0.7–4.0)
Lymphocytes Relative: 17 %
MCH: 31.2 pg (ref 26.0–34.0)
MCHC: 30.5 g/dL (ref 30.0–36.0)
MCV: 102.3 fL — AB (ref 78.0–100.0)
MONOS PCT: 22 %
Monocytes Absolute: 0.7 10*3/uL (ref 0.1–1.0)
NEUTROS ABS: 1.7 10*3/uL (ref 1.7–7.7)
Neutrophils Relative %: 55 %
Platelets: 105 10*3/uL — ABNORMAL LOW (ref 150–400)
RBC: 3.08 MIL/uL — AB (ref 3.87–5.11)
RDW: 22.3 % — AB (ref 11.5–15.5)
WBC: 3.1 10*3/uL — ABNORMAL LOW (ref 4.0–10.5)

## 2016-03-01 LAB — COOXEMETRY PANEL
CARBOXYHEMOGLOBIN: 3.1 % — AB (ref 0.5–1.5)
Methemoglobin: 0.7 % (ref 0.0–1.5)
O2 Saturation: 75.1 %
Total hemoglobin: 7.6 g/dL — ABNORMAL LOW (ref 12.0–16.0)

## 2016-03-01 LAB — BASIC METABOLIC PANEL
Anion gap: 8 (ref 5–15)
BUN: 65 mg/dL — AB (ref 6–20)
CHLORIDE: 106 mmol/L (ref 101–111)
CO2: 23 mmol/L (ref 22–32)
Calcium: 8.9 mg/dL (ref 8.9–10.3)
Creatinine, Ser: 2.57 mg/dL — ABNORMAL HIGH (ref 0.44–1.00)
GFR calc Af Amer: 21 mL/min — ABNORMAL LOW (ref >60)
GFR calc non Af Amer: 18 mL/min — ABNORMAL LOW (ref >60)
GLUCOSE: 95 mg/dL (ref 65–99)
POTASSIUM: 3.5 mmol/L (ref 3.5–5.1)
Sodium: 137 mmol/L (ref 135–145)

## 2016-03-01 MED ORDER — METOLAZONE 5 MG PO TABS
5.0000 mg | ORAL_TABLET | Freq: Two times a day (BID) | ORAL | Status: DC
  Administered 2016-03-01 – 2016-03-04 (×6): 5 mg via ORAL
  Filled 2016-03-01 (×6): qty 1

## 2016-03-01 MED ORDER — POTASSIUM CHLORIDE CRYS ER 20 MEQ PO TBCR
40.0000 meq | EXTENDED_RELEASE_TABLET | Freq: Two times a day (BID) | ORAL | Status: DC
  Administered 2016-03-01 – 2016-03-02 (×3): 40 meq via ORAL
  Filled 2016-03-01 (×3): qty 2

## 2016-03-01 MED ORDER — FUROSEMIDE 10 MG/ML IJ SOLN
30.0000 mg/h | INTRAMUSCULAR | Status: DC
  Administered 2016-03-01 – 2016-03-02 (×2): 20 mg/h via INTRAVENOUS
  Administered 2016-03-02 – 2016-03-03 (×2): 30 mg/h via INTRAVENOUS
  Filled 2016-03-01 (×7): qty 25

## 2016-03-01 MED ORDER — RIVAROXABAN 15 MG PO TABS
15.0000 mg | ORAL_TABLET | Freq: Two times a day (BID) | ORAL | Status: DC
  Administered 2016-03-01 – 2016-03-02 (×3): 15 mg via ORAL
  Filled 2016-03-01 (×4): qty 1

## 2016-03-01 NOTE — Progress Notes (Signed)
Advanced Heart Failure Rounding Note   Subjective:    Admitted with marked volume overload. Continues with sluggish diuresis despite addition of milrinone. CVP 23. Co-ox 75%. Hgb up to 9.6 with transfusion  Had a near fall trying to get out of bed  Denies SOB/Orthopnea. Complains of fatigue.   CO-OX 41%> 75%  Hgb 7.1>6.6 Creatinine 2.7>2.6>2.6    Objective:   Weight Range:  Vital Signs:   Temp:  [97.4 F (36.3 C)-98.2 F (36.8 C)] 97.6 F (36.4 C) (11/04 1158) Pulse Rate:  [60-67] 63 (11/04 1158) Resp:  [13-22] 22 (11/04 1158) BP: (100-127)/(35-59) 110/45 (11/04 1158) SpO2:  [93 %-100 %] 100 % (11/04 1158) Weight:  [182.3 kg (402 lb)-191 kg (421 lb)] 191 kg (421 lb) (11/04 0315) Last BM Date: 02/29/16  Weight change: Filed Weights   02/29/16 0348 02/29/16 2000 03/01/16 0315  Weight: (!) 182.3 kg (402 lb) (!) 182.3 kg (402 lb) (!) 191 kg (421 lb)    Intake/Output:   Intake/Output Summary (Last 24 hours) at 03/01/16 1255 Last data filed at 03/01/16 0600  Gross per 24 hour  Intake           632.02 ml  Output                0 ml  Net           632.02 ml     Physical Exam: CVP 2-24 General:  Chronically ill appearing No resp difficulty. In bed  HEENT: normal Neck: supple. JVP difficult to assess due to body habitus. Carotids 2+ bilat; no bruits. No lymphadenopathy or thryomegaly appreciated. Cor: PMI nondisplaced. Regular rate & rhythm. No rubs, gallops or murmurs. Lungs: Decreased in the bases.  Abdomen: obese, soft, nontender, nondistended. No hepatosplenomegaly. No bruits or masses. Good bowel sounds. Extremities: no cyanosis, clubbing, rash, R and LLE 3+ edema. RUE PICC Neuro: alert & orientedx3, cranial nerves grossly intact. moves all 4 extremities w/o difficulty. Affect pleasant  Telemetry: Vpaced 60s   Labs: Basic Metabolic Panel:  Recent Labs Lab 03/26/2016 1754 02/29/16 0520 03/01/16 0500  NA 140 138 137  K 4.6 4.5 3.5  CL 106 107 106    CO2 23 22 23   GLUCOSE 118* 84 95  BUN 68* 66* 65*  CREATININE 2.76* 2.60* 2.57*  CALCIUM 9.2 9.0 8.9  MG 2.2  --   --     Liver Function Tests:  Recent Labs Lab 03/19/2016 1754  AST 25  ALT 13*  ALKPHOS 66  BILITOT 1.4*  PROT 6.5  ALBUMIN 2.6*   No results for input(s): LIPASE, AMYLASE in the last 168 hours. No results for input(s): AMMONIA in the last 168 hours.  CBC:  Recent Labs Lab 02/27/16 1437 03/11/2016 1754 02/29/16 0520 03/01/16 0500  WBC 3.5* 3.7* 3.5* 3.1*  NEUTROABS 2.1 2.2 1.7 1.7  HGB 7.1* 7.1* 6.6* 9.6*  HCT 23.5* 24.5* 22.4* 31.5*  MCV 107.2* 109.4* 107.7* 102.3*  PLT 135* 138* 138* 105*    Cardiac Enzymes: No results for input(s): CKTOTAL, CKMB, CKMBINDEX, TROPONINI in the last 168 hours.  BNP: BNP (last 3 results)  Recent Labs  07/09/15 1158 07/19/15 1124 02/29/2016 1754  BNP 133.0* 149.0* 429.4*    ProBNP (last 3 results) No results for input(s): PROBNP in the last 8760 hours.    Other results:  Imaging: Dg Chest Port 1 View  Result Date: 03/21/2016 CLINICAL DATA:  Confirm line placement. EXAM: PORTABLE CHEST 1 VIEW COMPARISON:  Chest  radiograph November 29, 2015 FINDINGS: RIGHT PICC distal tip projects in proximal superior vena cava. The cardiac silhouette is moderately enlarged, mediastinal silhouette is nonsuspicious, mildly calcified aortic knob. Pulmonary vascular congestion. No pneumothorax. Dual lead LEFT cardiac pacemaker in situ. Soft tissue planes and included osseous structure nonsuspicious. IMPRESSION: RIGHT PICC distal tip projects in proximal superior vena cava. Stable cardiomegaly and pulmonary vascular congestion. Electronically Signed   By: Elon Alas M.D.   On: 03/12/2016 21:58     Medications:     Scheduled Medications: . allopurinol  50 mg Oral Daily  . atorvastatin  20 mg Oral q1800  . cholecalciferol  5,000 Units Oral Daily  . ferrous sulfate  325 mg Oral TID WC  . furosemide  80 mg Intravenous TID  .  loratadine  10 mg Oral Daily  . metolazone  5 mg Oral Daily  . mometasone-formoterol  2 puff Inhalation BID  . multivitamin with minerals  1 tablet Oral Daily  . potassium chloride SA  40 mEq Oral Daily  . sodium chloride flush  10-40 mL Intracatheter Q12H  . sodium chloride flush  3 mL Intravenous Q12H  . cyanocobalamin  500 mcg Oral Daily  . vitamin C  500 mg Oral BID    Infusions: . milrinone 0.25 mcg/kg/min (03/01/16 0600)    PRN Medications: sodium chloride, acetaminophen, albuterol, ipratropium-albuterol, ondansetron (ZOFRAN) IV, sodium chloride flush, sodium chloride flush   Assessment/Plan/Discussion    Robyn Porter is a 65 y.o. female with history with chronic systolic CHF, Anemia, OHS/OSA, symptomatic bradycardia s/p MDT PPm with CRT-P upgrade given high percentage of RV pacing, CKD stage IV.  Admitted with marked volume overload.  1. Chronic systolic CHF: Suspect nonischemic. EF 50-55% on 8/17 echo. Recent upgrade to Medtronic CRT given constant RV pacing. Volume status markedly elevated. Weight up 30 lbs since August.  CO-OX initially 41% c/w cardiogenic shock physiology. Started milrinone 0.25 mcg on 11/3.  - Sluggish urine output on  IV lasix 80 mg TID and 5 mg metolazone daily.  - Will start last gtt at 47. Place foley to quantify urine output and for safety -Stop BB.  -No Ace/ARB Arlyce Harman with CKD.  - Repeat echo pending - suspect RV failure may be major issue. Will likely need RHC at some point 2. Symptomatic bradycardia: MDT PPM. Upgraded to CRT given high percentage RV pacing.  3. Acute on CKD Stage IV- Creatinine baseline 2 -2.3 - Creatinine down a little today 2.7>2.6  - Will support with milrinone. Renal following.as outpatient.  4. Chronic atrial fibrillation:  - Hold Toprol XL today with low output.  Xarelto stopped duet o anemia. Will restart  Will transfuse 5. OHS/OSA: - Continue nightly CPAP. Continue CPAP from home.  6. Anemia: EGD/Colonoscopy  11/2015 AVMs. Hgb 6.6.-> 9.6 Check Iron panel. Check FOBT. Give 1UPRBC - States she has occasional hemorrhoidal bleed 7. Chronic venous stasis - With significant bilateral venous stasis changes.  8. Morbid obesity - Deconditioned. Will need PT consult.   9. Disability - Currently out of work.   Length of Stay: 2   Glori Bickers MD 03/01/2016, 12:55 PM  Advanced Heart Failure Team Pager [phone] (M-F; 7a - 4p)  Please contact [facility] for night-coverage after hours (4p -7a ) and weekends on [email or web address]

## 2016-03-02 ENCOUNTER — Other Ambulatory Visit: Payer: Self-pay | Admitting: Internal Medicine

## 2016-03-02 ENCOUNTER — Inpatient Hospital Stay (HOSPITAL_COMMUNITY): Payer: Medicare Other

## 2016-03-02 DIAGNOSIS — G459 Transient cerebral ischemic attack, unspecified: Secondary | ICD-10-CM

## 2016-03-02 LAB — BASIC METABOLIC PANEL
Anion gap: 10 (ref 5–15)
BUN: 65 mg/dL — AB (ref 6–20)
CHLORIDE: 102 mmol/L (ref 101–111)
CO2: 23 mmol/L (ref 22–32)
CREATININE: 2.6 mg/dL — AB (ref 0.44–1.00)
Calcium: 8.8 mg/dL — ABNORMAL LOW (ref 8.9–10.3)
GFR calc Af Amer: 21 mL/min — ABNORMAL LOW (ref >60)
GFR calc non Af Amer: 18 mL/min — ABNORMAL LOW (ref >60)
GLUCOSE: 158 mg/dL — AB (ref 65–99)
Potassium: 3.2 mmol/L — ABNORMAL LOW (ref 3.5–5.1)
SODIUM: 135 mmol/L (ref 135–145)

## 2016-03-02 LAB — CBC WITH DIFFERENTIAL/PLATELET
BASOS PCT: 1 %
Basophils Absolute: 0 10*3/uL (ref 0.0–0.1)
EOS PCT: 6 %
Eosinophils Absolute: 0.3 10*3/uL (ref 0.0–0.7)
HEMATOCRIT: 27.6 % — AB (ref 36.0–46.0)
Hemoglobin: 8.5 g/dL — ABNORMAL LOW (ref 12.0–15.0)
Lymphocytes Relative: 21 %
Lymphs Abs: 0.9 10*3/uL (ref 0.7–4.0)
MCH: 31.5 pg (ref 26.0–34.0)
MCHC: 30.8 g/dL (ref 30.0–36.0)
MCV: 102.2 fL — AB (ref 78.0–100.0)
MONO ABS: 0.7 10*3/uL (ref 0.1–1.0)
Monocytes Relative: 16 %
NEUTROS ABS: 2.3 10*3/uL (ref 1.7–7.7)
Neutrophils Relative %: 56 %
PLATELETS: 122 10*3/uL — AB (ref 150–400)
RBC: 2.7 MIL/uL — ABNORMAL LOW (ref 3.87–5.11)
RDW: 21.7 % — AB (ref 11.5–15.5)
WBC: 4.2 10*3/uL (ref 4.0–10.5)

## 2016-03-02 LAB — COOXEMETRY PANEL
Carboxyhemoglobin: 2.7 % — ABNORMAL HIGH (ref 0.5–1.5)
Methemoglobin: 0.5 % (ref 0.0–1.5)
O2 Saturation: 68.6 %
Total hemoglobin: 8.5 g/dL — ABNORMAL LOW (ref 12.0–16.0)

## 2016-03-02 LAB — ECHOCARDIOGRAM COMPLETE
HEIGHTINCHES: 64 in
Weight: 5742.54 oz

## 2016-03-02 MED ORDER — POTASSIUM CHLORIDE CRYS ER 20 MEQ PO TBCR
40.0000 meq | EXTENDED_RELEASE_TABLET | Freq: Once | ORAL | Status: AC
Start: 2016-03-02 — End: 2016-03-02
  Administered 2016-03-02: 40 meq via ORAL

## 2016-03-02 MED ORDER — POTASSIUM CHLORIDE CRYS ER 20 MEQ PO TBCR
40.0000 meq | EXTENDED_RELEASE_TABLET | Freq: Three times a day (TID) | ORAL | Status: DC
  Administered 2016-03-02 – 2016-03-03 (×3): 40 meq via ORAL
  Filled 2016-03-02 (×3): qty 2

## 2016-03-02 NOTE — Progress Notes (Signed)
*  PRELIMINARY RESULTS* Echocardiogram 2D Echocardiogram has been performed.  Leavy Cella 03/02/2016, 10:54 AM

## 2016-03-02 NOTE — Progress Notes (Signed)
Advanced Heart Failure Rounding Note   Subjective:    Admitted with marked volume overload. Continues with sluggish diuresis despite addition of milrinone and switch to lasix gtt.   CVP remains 22-23. Co-ox 68%.   Had a near fall trying to get out of bed  Denies SOB/Orthopnea. Complains of fatigue.   Creatinine 2.7>2.6>2.6>2.6  Objective:   Weight Range:  Vital Signs:   Temp:  [97.3 F (36.3 C)-97.8 F (36.6 C)] 97.3 F (36.3 C) (11/05 0757) Pulse Rate:  [59-72] 62 (11/05 0806) Resp:  [14-22] 17 (11/05 0806) BP: (101-122)/(40-50) 114/45 (11/05 0757) SpO2:  [91 %-100 %] 94 % (11/05 0806) Weight:  [162.8 kg (358 lb 14.5 oz)] 162.8 kg (358 lb 14.5 oz) (11/05 0500) Last BM Date: 02/29/16  Weight change: Filed Weights   02/29/16 2000 03/01/16 0315 03/02/16 0500  Weight: (!) 182.3 kg (402 lb) (!) 191 kg (421 lb) (!) 162.8 kg (358 lb 14.5 oz)    Intake/Output:   Intake/Output Summary (Last 24 hours) at 03/02/16 1122 Last data filed at 03/02/16 1100  Gross per 24 hour  Intake           1282.5 ml  Output             2075 ml  Net           -792.5 ml     Physical Exam: CVP  General:  Chronically ill appearing No resp difficulty. In bed  HEENT: normal Neck: supple. JVP difficult to assess due to body habitus. Carotids 2+ bilat; no bruits. No lymphadenopathy or thryomegaly appreciated. Cor: PMI nondisplaced. Regular rate & rhythm. No rubs, gallops or murmurs. Lungs: Decreased in the bases.  Abdomen: obese, soft, nontender, nondistended. No hepatosplenomegaly. No bruits or masses. Good bowel sounds. Extremities: no cyanosis, clubbing, rash, R and LLE 2-3+ edema. RUE PICC Neuro: alert & orientedx3, cranial nerves grossly intact. moves all 4 extremities w/o difficulty. Affect pleasant  Telemetry: Vpaced 60s   Labs: Basic Metabolic Panel:  Recent Labs Lab 03/04/2016 1754 02/29/16 0520 03/01/16 0500 03/02/16 0445  NA 140 138 137 135  K 4.6 4.5 3.5 3.2*  CL 106  107 106 102  CO2 23 22 23 23   GLUCOSE 118* 84 95 158*  BUN 68* 66* 65* 65*  CREATININE 2.76* 2.60* 2.57* 2.60*  CALCIUM 9.2 9.0 8.9 8.8*  MG 2.2  --   --   --     Liver Function Tests:  Recent Labs Lab 03/01/2016 1754  AST 25  ALT 13*  ALKPHOS 66  BILITOT 1.4*  PROT 6.5  ALBUMIN 2.6*   No results for input(s): LIPASE, AMYLASE in the last 168 hours. No results for input(s): AMMONIA in the last 168 hours.  CBC:  Recent Labs Lab 02/27/16 1437 03/02/2016 1754 02/29/16 0520 03/01/16 0500 03/02/16 0445  WBC 3.5* 3.7* 3.5* 3.1* 4.2  NEUTROABS 2.1 2.2 1.7 1.7 2.3  HGB 7.1* 7.1* 6.6* 9.6* 8.5*  HCT 23.5* 24.5* 22.4* 31.5* 27.6*  MCV 107.2* 109.4* 107.7* 102.3* 102.2*  PLT 135* 138* 138* 105* 122*    Cardiac Enzymes: No results for input(s): CKTOTAL, CKMB, CKMBINDEX, TROPONINI in the last 168 hours.  BNP: BNP (last 3 results)  Recent Labs  07/09/15 1158 07/19/15 1124 03/07/2016 1754  BNP 133.0* 149.0* 429.4*    ProBNP (last 3 results) No results for input(s): PROBNP in the last 8760 hours.    Other results:  Imaging: No results found.   Medications:  Scheduled Medications: . allopurinol  50 mg Oral Daily  . atorvastatin  20 mg Oral q1800  . cholecalciferol  5,000 Units Oral Daily  . ferrous sulfate  325 mg Oral TID WC  . loratadine  10 mg Oral Daily  . metolazone  5 mg Oral BID  . mometasone-formoterol  2 puff Inhalation BID  . multivitamin with minerals  1 tablet Oral Daily  . potassium chloride SA  40 mEq Oral BID  . Rivaroxaban  15 mg Oral BID WC  . sodium chloride flush  10-40 mL Intracatheter Q12H  . sodium chloride flush  3 mL Intravenous Q12H  . cyanocobalamin  500 mcg Oral Daily  . vitamin C  500 mg Oral BID    Infusions: . furosemide (LASIX) infusion 20 mg/hr (03/02/16 0400)  . milrinone 0.25 mcg/kg/min (03/02/16 0446)    PRN Medications: sodium chloride, acetaminophen, albuterol, ipratropium-albuterol, ondansetron (ZOFRAN) IV,  sodium chloride flush, sodium chloride flush   Assessment/Plan/Discussion    Kashana Molenaar is a 65 y.o. female with history with chronic systolic CHF, Anemia, OHS/OSA, symptomatic bradycardia s/p MDT PPm with CRT-P upgrade given high percentage of RV pacing, CKD stage IV.  Admitted with marked volume overload.  1. Chronic systolic CHF: Suspect nonischemic. EF 50-55% on 8/17 echo. Recent upgrade to Medtronic CRT given constant RV pacing. Volume status markedly elevated. Weight up 30 lbs since August.  CO-OX initially 41% c/w cardiogenic shock physiology. Started milrinone 0.25 mcg on 11/3.  - Sluggish urine output on  IV lasix 80 mg TID and 5 mg metolazone daily. Switched to lasix gtt at 20 on 11/4 - Increase lasix gtt to 30/hr -Off BB.  -No Ace/ARB Arlyce Harman with CKD.  - Repeat echo pending - suspect RV failure may be major issue. Will likely need RHC at some point 2. Symptomatic bradycardia: MDT PPM. Upgraded to CRT given high percentage RV pacing.  3. Acute on CKD Stage IV- Creatinine baseline 2 -2.3 - Creatinine stable at 2.6 - Will support with milrinone. Renal following.as outpatient.  4. Chronic atrial fibrillation:  - Hold Toprol XL today with low output.  Xarelto stopped due to anemia. Restarted 11/4 5. OHS/OSA: - Continue nightly CPAP. Continue CPAP from home.  6. 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 Hgb 6.6.-> 9.6 Check Iron panel. Check FOBT. Given 1UPRBC on admit - States she has occasional hemorrhoidal bleed - Has been followed by Dr. Lindi Adie who is working up anemia 7. Chronic venous stasis - With significant bilateral venous stasis changes.  8. Morbid obesity - Deconditioned. Will need PT consult.   9. Disability - Currently out of work.   Length of Stay: 3   Glori Bickers MD 03/02/2016, 11:22 AM  Advanced Heart Failure Team Pager [phone] (M-F; 7a - 4p)  Please contact [facility] for night-coverage after hours (4p -7a ) and weekends on  [email or web address]

## 2016-03-03 LAB — BASIC METABOLIC PANEL
ANION GAP: 11 (ref 5–15)
BUN: 63 mg/dL — ABNORMAL HIGH (ref 6–20)
CHLORIDE: 99 mmol/L — AB (ref 101–111)
CO2: 23 mmol/L (ref 22–32)
CREATININE: 2.47 mg/dL — AB (ref 0.44–1.00)
Calcium: 8.7 mg/dL — ABNORMAL LOW (ref 8.9–10.3)
GFR calc non Af Amer: 19 mL/min — ABNORMAL LOW (ref >60)
GFR, EST AFRICAN AMERICAN: 22 mL/min — AB (ref >60)
Glucose, Bld: 192 mg/dL — ABNORMAL HIGH (ref 65–99)
Potassium: 3.2 mmol/L — ABNORMAL LOW (ref 3.5–5.1)
SODIUM: 133 mmol/L — AB (ref 135–145)

## 2016-03-03 LAB — CBC WITH DIFFERENTIAL/PLATELET
BASOS PCT: 0 %
Basophils Absolute: 0 10*3/uL (ref 0.0–0.1)
EOS ABS: 0.3 10*3/uL (ref 0.0–0.7)
Eosinophils Relative: 6 %
HCT: 27.7 % — ABNORMAL LOW (ref 36.0–46.0)
HEMOGLOBIN: 8.4 g/dL — AB (ref 12.0–15.0)
LYMPHS ABS: 0.8 10*3/uL (ref 0.7–4.0)
Lymphocytes Relative: 16 %
MCH: 31 pg (ref 26.0–34.0)
MCHC: 30.3 g/dL (ref 30.0–36.0)
MCV: 102.2 fL — ABNORMAL HIGH (ref 78.0–100.0)
Monocytes Absolute: 0.8 10*3/uL (ref 0.1–1.0)
Monocytes Relative: 15 %
NEUTROS PCT: 63 %
Neutro Abs: 3.3 10*3/uL (ref 1.7–7.7)
Platelets: 135 10*3/uL — ABNORMAL LOW (ref 150–400)
RBC: 2.71 MIL/uL — AB (ref 3.87–5.11)
RDW: 21 % — ABNORMAL HIGH (ref 11.5–15.5)
WBC: 5.2 10*3/uL (ref 4.0–10.5)

## 2016-03-03 LAB — COOXEMETRY PANEL
Carboxyhemoglobin: 2.3 % — ABNORMAL HIGH (ref 0.5–1.5)
METHEMOGLOBIN: 0.7 % (ref 0.0–1.5)
O2 SAT: 74.6 %
TOTAL HEMOGLOBIN: 7.4 g/dL — AB (ref 12.0–16.0)

## 2016-03-03 MED ORDER — FUROSEMIDE 10 MG/ML IJ SOLN
160.0000 mg | Freq: Three times a day (TID) | INTRAVENOUS | Status: DC
  Administered 2016-03-03 – 2016-03-04 (×4): 160 mg via INTRAVENOUS
  Filled 2016-03-03 (×7): qty 16

## 2016-03-03 MED ORDER — POTASSIUM CHLORIDE CRYS ER 20 MEQ PO TBCR
20.0000 meq | EXTENDED_RELEASE_TABLET | Freq: Once | ORAL | Status: AC
  Administered 2016-03-03: 20 meq via ORAL
  Filled 2016-03-03: qty 1

## 2016-03-03 MED ORDER — POTASSIUM CHLORIDE CRYS ER 20 MEQ PO TBCR
60.0000 meq | EXTENDED_RELEASE_TABLET | Freq: Three times a day (TID) | ORAL | Status: DC
  Administered 2016-03-03 – 2016-03-04 (×3): 60 meq via ORAL
  Filled 2016-03-03 (×3): qty 3

## 2016-03-03 MED ORDER — ORAL CARE MOUTH RINSE
15.0000 mL | Freq: Two times a day (BID) | OROMUCOSAL | Status: DC
  Administered 2016-03-03 – 2016-03-04 (×2): 15 mL via OROMUCOSAL

## 2016-03-03 MED ORDER — RIVAROXABAN 15 MG PO TABS
15.0000 mg | ORAL_TABLET | Freq: Every day | ORAL | Status: DC
  Administered 2016-03-03: 15 mg via ORAL
  Filled 2016-03-03: qty 1

## 2016-03-03 MED ORDER — GUAIFENESIN ER 600 MG PO TB12
600.0000 mg | ORAL_TABLET | Freq: Two times a day (BID) | ORAL | Status: DC
  Administered 2016-03-03 – 2016-03-13 (×21): 600 mg via ORAL
  Filled 2016-03-03 (×21): qty 1

## 2016-03-03 MED ORDER — CHLORHEXIDINE GLUCONATE 0.12 % MT SOLN
15.0000 mL | Freq: Two times a day (BID) | OROMUCOSAL | Status: DC
  Administered 2016-03-03 – 2016-03-07 (×9): 15 mL via OROMUCOSAL
  Filled 2016-03-03 (×8): qty 15

## 2016-03-03 NOTE — Evaluation (Signed)
Physical Therapy Evaluation Patient Details Name: Robyn Porter MRN: [id number] DOB: [birth year] Today's Date: 03/03/2016   History of Present Illness  Pt adm with significant volume overload. PMH - chronic systolic heart failure, htn, dm, pacer, copd, ckd  Clinical Impression  Pt admitted with above diagnosis and presents to PT with functional limitations due to deficits listed below (See PT problem list). Pt needs skilled PT to maximize independence and safety to allow discharge to home with family. Pt strongly wants to return home but will need to be able to at least perform transfers on her own. She has been hospitalized multiple times this year and has been able to rebound enough to return home. If she doesn't bounce back she may need ST-SNF. She is aware of this possibility.     Follow Up Recommendations Home health PT (If pt able to mobilize on her own)    Equipment Recommendations  None recommended by PT    Recommendations for Other Services       Precautions / Restrictions Precautions Precautions: Fall Restrictions Weight Bearing Restrictions: No      Mobility  Bed Mobility Overal bed mobility: Needs Assistance Bed Mobility: Supine to Sit     Supine to sit: Min assist     General bed mobility comments: Assist to elevate trunk  Transfers Overall transfer level: Needs assistance Equipment used: Rolling walker (2 wheeled) Transfers: Sit to/from [facility] Sit to Stand: +2 physical assistance;Min assist Stand pivot transfers: +2 physical assistance;Min assist       General transfer comment: Assist to bring hips and trunk up. Assist with balance and support with stand pivot from bed to recliner  Ambulation/Gait                Stairs            Wheelchair Mobility    Modified Rankin (Stroke Patients Only)       Balance Overall balance assessment: Needs assistance Sitting-balance support: No upper extremity supported;Feet  supported Sitting balance-Leahy Scale: Fair     Standing balance support: Bilateral upper extremity supported Standing balance-Leahy Scale: Poor Standing balance comment: walker and min A for static standing                             Pertinent Vitals/Pain      Home Living Family/patient expects to be discharged to:: Private residence Living Arrangements: Other relatives (Nephew and his wife) Available Help at Discharge: Family;Friend(s);Available PRN/intermittently Type of Home: House Home Access: Ramped entrance     Home Layout: Able to live on main level with bedroom/bathroom Home Equipment: Walker - 2 wheels;Walker - 4 wheels;Wheelchair - manual;Cane - Clinical research associate Comments: 4ww to walk in house from bedroom to kitchen , uses a cane in the bathroom    Prior Function Level of Independence: Needs assistance   Gait / Transfers Assistance Needed: walks short distances with rollator except quad cane and counter in the bathroom. Uses manual w/c with foot propulsion otherwise           Hand Dominance   Dominant Hand: Right    Extremity/Trunk Assessment   Upper Extremity Assessment: Defer to OT evaluation           Lower Extremity Assessment: Generalized weakness         Communication   Communication: No difficulties  Cognition Arousal/Alertness: Awake/alert Behavior During Therapy: WFL for tasks assessed/performed Overall Cognitive Status: Within Functional  Limits for tasks assessed                      General Comments      Exercises     Assessment/Plan    PT Assessment Patient needs continued PT services  PT Problem List Decreased strength;Decreased activity tolerance;Decreased balance;Decreased mobility;Obesity          PT Treatment Interventions Gait training;DME instruction;Functional mobility training;Therapeutic activities;Therapeutic exercise;Balance training;Patient/family education    PT Goals  (Current goals can be found in the Care Plan section)  Acute Rehab PT Goals Patient Stated Goal: return home PT Goal Formulation: With patient Time For Goal Achievement: 03/10/16 Potential to Achieve Goals: Fair    Frequency Min 3X/week   Barriers to discharge        Co-evaluation               End of Session Equipment Utilized During Treatment: Oxygen Activity Tolerance: Patient limited by fatigue Patient left: in chair;with call bell/phone within reach Nurse Communication: Mobility status         Time: [id number] PT Time Calculation (min) (ACUTE ONLY): 23 min   Charges:   PT Evaluation $PT Eval Moderate Complexity: 1 Procedure PT Treatments $Therapeutic Activity: 8-22 mins   PT G Codes:        Kashawna Manzer 03/05/16, 2:39 PM [facility] PT [phone]

## 2016-03-03 NOTE — Progress Notes (Signed)
Advanced Heart Failure Rounding Note   Subjective:    Admitted with marked volume overload. Continues with sluggish diuresis despite addition of milrinone and switch to lasix gtt.   CVP remains 20-21. Co-ox 74.6  No SOB/Orthopnea at rest.  Remains fatigued.   Creatinine 2.7>2.6>2.6>2.6>2.47  Standing weights vastly different from bed weights.   Echo: EF 60-65%, severe LVH, moderate AS with mean gradient 32 mmHg, RV not well visualized.   Objective:   Weight Range:  Vital Signs:   Temp:  [97.3 F (36.3 C)-98.3 F (36.8 C)] 98.2 F (36.8 C) (11/06 0420) Pulse Rate:  [62-69] 64 (11/06 0500) Resp:  [12-21] 13 (11/06 0500) BP: (110-129)/(38-52) 114/44 (11/06 0420) SpO2:  [92 %-100 %] 100 % (11/06 0500) Weight:  [359 lb 8 oz (163.1 kg)] 359 lb 8 oz (163.1 kg) (11/06 0500) Last BM Date: 02/29/16  Weight change: Filed Weights   03/01/16 0315 03/02/16 0500 03/03/16 0500  Weight: (!) 421 lb (191 kg) (!) 358 lb 14.5 oz (162.8 kg) (!) 359 lb 8 oz (163.1 kg)    Intake/Output:   Intake/Output Summary (Last 24 hours) at 03/03/16 0732 Last data filed at 03/03/16 0600  Gross per 24 hour  Intake          1520.87 ml  Output             1625 ml  Net          -104.13 ml     Physical Exam: CVP 20-21 General:  Chronically ill appearing No resp difficulty. In bed  HEENT: normal Neck: supple. JVP up to jaw. Carotids 2+ bilat; no bruits. No thyromegaly or nodule noted.  Cor: PMI nondisplaced. RRR. 3/6 systolic crescendo-decrescendo murmur RUSB.   Lungs: Diminished basilar sounds.  Abdomen: obese, soft, NT, ND, no HSM. No bruits or masses. +BS  Extremities: no cyanosis, clubbing, rash, R and LLE 2-3+ edema into thighs. RUE PICC Neuro: alert & orientedx3, cranial nerves grossly intact. moves all 4 extremities w/o difficulty. Affect pleasant  Telemetry: Reviewed, V-paced in 60s    Labs: Basic Metabolic Panel:  Recent Labs Lab 03/25/2016 1754 02/29/16 0520 03/01/16 0500  03/02/16 0445 03/03/16 0504  NA 140 138 137 135 133*  K 4.6 4.5 3.5 3.2* 3.2*  CL 106 107 106 102 99*  CO2 23 22 23 23 23   GLUCOSE 118* 84 95 158* 192*  BUN 68* 66* 65* 65* 63*  CREATININE 2.76* 2.60* 2.57* 2.60* 2.47*  CALCIUM 9.2 9.0 8.9 8.8* 8.7*  MG 2.2  --   --   --   --     Liver Function Tests:  Recent Labs Lab 03/22/2016 1754  AST 25  ALT 13*  ALKPHOS 66  BILITOT 1.4*  PROT 6.5  ALBUMIN 2.6*   No results for input(s): LIPASE, AMYLASE in the last 168 hours. No results for input(s): AMMONIA in the last 168 hours.  CBC:  Recent Labs Lab 03/27/2016 1754 02/29/16 0520 03/01/16 0500 03/02/16 0445 03/03/16 0504  WBC 3.7* 3.5* 3.1* 4.2 5.2  NEUTROABS 2.2 1.7 1.7 2.3 3.3  HGB 7.1* 6.6* 9.6* 8.5* 8.4*  HCT 24.5* 22.4* 31.5* 27.6* 27.7*  MCV 109.4* 107.7* 102.3* 102.2* 102.2*  PLT 138* 138* 105* 122* 135*    Cardiac Enzymes: No results for input(s): CKTOTAL, CKMB, CKMBINDEX, TROPONINI in the last 168 hours.  BNP: BNP (last 3 results)  Recent Labs  07/09/15 1158 07/19/15 1124 03/07/2016 1754  BNP 133.0* 149.0* 429.4*  ProBNP (last 3 results) No results for input(s): PROBNP in the last 8760 hours.    Other results:  Imaging: No results found.   Medications:     Scheduled Medications: . allopurinol  50 mg Oral Daily  . atorvastatin  20 mg Oral q1800  . cholecalciferol  5,000 Units Oral Daily  . ferrous sulfate  325 mg Oral TID WC  . loratadine  10 mg Oral Daily  . metolazone  5 mg Oral BID  . mometasone-formoterol  2 puff Inhalation BID  . multivitamin with minerals  1 tablet Oral Daily  . potassium chloride SA  40 mEq Oral TID  . Rivaroxaban  15 mg Oral BID WC  . sodium chloride flush  10-40 mL Intracatheter Q12H  . sodium chloride flush  3 mL Intravenous Q12H  . cyanocobalamin  500 mcg Oral Daily  . vitamin C  500 mg Oral BID    Infusions: . furosemide (LASIX) infusion 30 mg/hr (03/03/16 0600)  . milrinone 0.25 mcg/kg/min (03/03/16  0600)    PRN Medications: sodium chloride, acetaminophen, albuterol, ipratropium-albuterol, ondansetron (ZOFRAN) IV, sodium chloride flush, sodium chloride flush   Assessment/Plan/Discussion    Robyn Porter is a 65 y.o. female with history with chronic systolic CHF, Anemia, OHS/OSA, symptomatic bradycardia s/p MDT PPm with CRT-P upgrade given high percentage of RV pacing, CKD stage IV.  Admitted with marked volume overload.  1. Chronic diastolic CHF: EF [id number] on echo this admission, RV poorly visualized. Recent upgrade to Medtronic CRT given constant RV pacing. Volume status markedly elevated. Weight up 30 lbs since August.  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  Renal function stable.  - Started milrinone 0.25 mcg on 11/3 for RV support.  BP stable.  - Stop Lasix gtt, start Lasix 160 mg IV every 8 hrs and continue metolazone 5 mg bid.  - Off BB with low coox on admission.  - Will likely need RHC, will see how she does with diuretic change today.  2. Symptomatic bradycardia: MDT PPM. Upgraded to CRT given high percentage RV pacing.  3. Acute on CKD Stage IV: Creatinine baseline 2 -2.3, stable at 2.47 today. - Will support with milrinone. - Renal follows as outpatient.  If we are unsuccessful with diuresis, may need to consider CVVH.  4. Chronic atrial fibrillation:  - Hold Toprol XL for now with low output. - Remains on Xarelto.  5. OHS/OSA:Continue nightly CPAP.  6. 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 Given 1UPRBC on admit.  - States she has occasional hemorrhoidal bleed - Has been followed by Dr. Lindi Adie who is working up anemia 7. Chronic venous stasis: With significant bilateral venous stasis changes.  8. Morbid obesity: Deconditioned. Will need PT consult.   9. Hypokalemia - Supp K  10. Aortic stenosis: Moderate by echo this admission.   She remains markedly volume overload but has had OK diuresis so far. CVP trending down,  but slowly.  Weights inaccurate. Standing weight this morning similar to office weight 02/27/16.  Length of Stay: 4  Annamaria Helling 03/03/2016, 7:32 AM  Advanced Heart Failure Team Pager [phone] (M-F; 7a - 4p)  Please contact [facility] for night-coverage after hours (4p -7a ) and weekends on [email or web address]  Patient seen with PA, agree with the above note.  She remains markedly volume overloaded.  Creatinine stable.  Poor diuresis, however.  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  Currently supporting RV with milrinone. CVP still > 20.  - Will change Lasix  to 160 mg IV every 8 hrs and continue metolazone. If unable to diuresis with meds, may need CVVH.  - Follow renal function closely.  - Supplement K. - PT/OT.   [name] 03/03/2016 8:01 AM

## 2016-03-04 ENCOUNTER — Inpatient Hospital Stay (HOSPITAL_COMMUNITY): Payer: Medicare Other

## 2016-03-04 LAB — TYPE AND SCREEN
ABO/RH(D): B POS
ANTIBODY SCREEN: POSITIVE
DAT, IgG: POSITIVE
UNIT DIVISION: 0
Unit division: 0
Unit division: 0

## 2016-03-04 LAB — CBC WITH DIFFERENTIAL/PLATELET
BASOS ABS: 0 10*3/uL (ref 0.0–0.1)
BASOS PCT: 0 %
EOS ABS: 0.3 10*3/uL (ref 0.0–0.7)
Eosinophils Relative: 6 %
HEMATOCRIT: 24.7 % — AB (ref 36.0–46.0)
HEMOGLOBIN: 7.6 g/dL — AB (ref 12.0–15.0)
Lymphocytes Relative: 14 %
Lymphs Abs: 0.8 10*3/uL (ref 0.7–4.0)
MCH: 31.7 pg (ref 26.0–34.0)
MCHC: 30.8 g/dL (ref 30.0–36.0)
MCV: 102.9 fL — ABNORMAL HIGH (ref 78.0–100.0)
Monocytes Absolute: 0.8 10*3/uL (ref 0.1–1.0)
Monocytes Relative: 15 %
NEUTROS ABS: 3.8 10*3/uL (ref 1.7–7.7)
NEUTROS PCT: 65 %
Platelets: 120 10*3/uL — ABNORMAL LOW (ref 150–400)
RBC: 2.4 MIL/uL — ABNORMAL LOW (ref 3.87–5.11)
RDW: 20.3 % — ABNORMAL HIGH (ref 11.5–15.5)
WBC: 5.8 10*3/uL (ref 4.0–10.5)

## 2016-03-04 LAB — COOXEMETRY PANEL
CARBOXYHEMOGLOBIN: 2.3 % — AB (ref 0.5–1.5)
Methemoglobin: 0.7 % (ref 0.0–1.5)
O2 SAT: 74.5 %
TOTAL HEMOGLOBIN: 9.8 g/dL — AB (ref 12.0–16.0)

## 2016-03-04 LAB — BASIC METABOLIC PANEL
ANION GAP: 12 (ref 5–15)
BUN: 68 mg/dL — ABNORMAL HIGH (ref 6–20)
CALCIUM: 8.9 mg/dL (ref 8.9–10.3)
CO2: 23 mmol/L (ref 22–32)
CREATININE: 2.44 mg/dL — AB (ref 0.44–1.00)
Chloride: 102 mmol/L (ref 101–111)
GFR, EST AFRICAN AMERICAN: 23 mL/min — AB (ref >60)
GFR, EST NON AFRICAN AMERICAN: 20 mL/min — AB (ref >60)
Glucose, Bld: 102 mg/dL — ABNORMAL HIGH (ref 65–99)
Potassium: 3.3 mmol/L — ABNORMAL LOW (ref 3.5–5.1)
SODIUM: 137 mmol/L (ref 135–145)

## 2016-03-04 LAB — POCT ACTIVATED CLOTTING TIME
ACTIVATED CLOTTING TIME: 164 s
ACTIVATED CLOTTING TIME: 175 s
Activated Clotting Time: 180 seconds
Activated Clotting Time: 180 seconds
Activated Clotting Time: 175 seconds
Activated Clotting Time: 175 seconds

## 2016-03-04 LAB — MAGNESIUM: MAGNESIUM: 2.2 mg/dL (ref 1.7–2.4)

## 2016-03-04 MED ORDER — PRISMASOL BGK 4/2.5 32-4-2.5 MEQ/L IV SOLN
INTRAVENOUS | Status: DC
  Administered 2016-03-04 – 2016-03-13 (×13): via INTRAVENOUS_CENTRAL
  Filled 2016-03-04 (×16): qty 5000

## 2016-03-04 MED ORDER — PRISMASOL BGK 4/2.5 32-4-2.5 MEQ/L IV SOLN
INTRAVENOUS | Status: DC
  Administered 2016-03-04 – 2016-03-13 (×10): via INTRAVENOUS_CENTRAL
  Filled 2016-03-04 (×11): qty 5000

## 2016-03-04 MED ORDER — PRISMASOL BGK 4/2.5 32-4-2.5 MEQ/L IV SOLN
INTRAVENOUS | Status: DC
  Administered 2016-03-04 – 2016-03-16 (×51): via INTRAVENOUS_CENTRAL
  Filled 2016-03-04 (×71): qty 5000

## 2016-03-04 MED ORDER — HEPARIN SODIUM (PORCINE) 1000 UNIT/ML IJ SOLN: 3000.0000 [IU] | Freq: Once | INTRAMUSCULAR | Status: AC

## 2016-03-04 MED ORDER — HEPARIN BOLUS VIA INFUSION (CRRT)
1000.0000 [IU] | INTRAVENOUS | Status: DC | PRN
  Filled 2016-03-04 (×2): qty 1000

## 2016-03-04 MED ORDER — HEPARIN (PORCINE) 2000 UNITS/L FOR CRRT
INTRAVENOUS_CENTRAL | Status: DC | PRN
  Administered 2016-03-07 – 2016-03-10 (×3): via INTRAVENOUS_CENTRAL
  Filled 2016-03-04: qty 1000
  Filled 2016-03-04: qty 500
  Filled 2016-03-04 (×5): qty 1000

## 2016-03-04 MED ORDER — GUAIFENESIN-DM 100-10 MG/5ML PO SYRP
5.0000 mL | ORAL_SOLUTION | ORAL | Status: DC | PRN
  Administered 2016-03-04 – 2016-03-09 (×8): 5 mL via ORAL
  Filled 2016-03-04 (×8): qty 5

## 2016-03-04 MED ORDER — POTASSIUM CHLORIDE CRYS ER 20 MEQ PO TBCR
40.0000 meq | EXTENDED_RELEASE_TABLET | Freq: Once | ORAL | Status: AC
  Administered 2016-03-04: 40 meq via ORAL
  Filled 2016-03-04: qty 2

## 2016-03-04 MED ORDER — FERUMOXYTOL INJECTION 510 MG/17 ML
510.0000 mg | Freq: Once | INTRAVENOUS | Status: AC
  Administered 2016-03-04: 510 mg via INTRAVENOUS
  Filled 2016-03-04: qty 17

## 2016-03-04 MED ORDER — HEPARIN SODIUM (PORCINE) 1000 UNIT/ML DIALYSIS
1000.0000 [IU] | INTRAMUSCULAR | Status: DC | PRN
  Administered 2016-03-04 – 2016-03-11 (×2): 2400 [IU] via INTRAVENOUS_CENTRAL
  Administered 2016-03-14 (×2): 1000 [IU] via INTRAVENOUS_CENTRAL
  Filled 2016-03-04: qty 4
  Filled 2016-03-04: qty 1
  Filled 2016-03-04: qty 2
  Filled 2016-03-04: qty 1
  Filled 2016-03-04: qty 3

## 2016-03-04 MED ORDER — SODIUM CHLORIDE 0.9 % IJ SOLN
250.0000 [IU]/h | INTRAMUSCULAR | Status: DC
  Administered 2016-03-04: 250 [IU]/h via INTRAVENOUS_CENTRAL
  Administered 2016-03-05 (×2): 1400 [IU]/h via INTRAVENOUS_CENTRAL
  Administered 2016-03-05: 1150 [IU]/h via INTRAVENOUS_CENTRAL
  Administered 2016-03-06 (×2): 1600 [IU]/h via INTRAVENOUS_CENTRAL
  Administered 2016-03-06: 1400 [IU]/h via INTRAVENOUS_CENTRAL
  Administered 2016-03-06: 1450 [IU]/h via INTRAVENOUS_CENTRAL
  Administered 2016-03-06: 1550 [IU]/h via INTRAVENOUS_CENTRAL
  Administered 2016-03-06: 1400 [IU]/h via INTRAVENOUS_CENTRAL
  Administered 2016-03-06: 1600 [IU]/h via INTRAVENOUS_CENTRAL
  Administered 2016-03-06: 1500 [IU]/h via INTRAVENOUS_CENTRAL
  Administered 2016-03-07 (×3): 1600 [IU]/h via INTRAVENOUS_CENTRAL
  Administered 2016-03-07 (×2): 1650 [IU]/h via INTRAVENOUS_CENTRAL
  Administered 2016-03-08 (×3): 1700 [IU]/h via INTRAVENOUS_CENTRAL
  Administered 2016-03-08 – 2016-03-09 (×2): 1650 [IU]/h via INTRAVENOUS_CENTRAL
  Administered 2016-03-09: 1900 [IU]/h via INTRAVENOUS_CENTRAL
  Administered 2016-03-09: 1700 [IU]/h via INTRAVENOUS_CENTRAL
  Administered 2016-03-09: 1650 [IU]/h via INTRAVENOUS_CENTRAL
  Administered 2016-03-10: 1850 [IU]/h via INTRAVENOUS_CENTRAL
  Administered 2016-03-10: 1750 [IU]/h via INTRAVENOUS_CENTRAL
  Administered 2016-03-10 (×3): 1850 [IU]/h via INTRAVENOUS_CENTRAL
  Administered 2016-03-11: 1350 [IU]/h via INTRAVENOUS_CENTRAL
  Administered 2016-03-11: 1400 [IU]/h via INTRAVENOUS_CENTRAL
  Administered 2016-03-11: 1500 [IU]/h via INTRAVENOUS_CENTRAL
  Administered 2016-03-12: 1450 [IU]/h via INTRAVENOUS_CENTRAL
  Administered 2016-03-12: 1500 [IU]/h via INTRAVENOUS_CENTRAL
  Administered 2016-03-12 (×2): 1450 [IU]/h via INTRAVENOUS_CENTRAL
  Administered 2016-03-13: 900 [IU]/h via INTRAVENOUS_CENTRAL
  Administered 2016-03-13: 1150 [IU]/h via INTRAVENOUS_CENTRAL
  Filled 2016-03-04 (×39): qty 2

## 2016-03-04 MED ORDER — SODIUM CHLORIDE 0.9 % IV SOLN
510.0000 mg | Freq: Once | INTRAVENOUS | Status: AC
  Filled 2016-03-04: qty 17

## 2016-03-04 NOTE — Clinical Social Work Placement (Signed)
   CLINICAL SOCIAL WORK PLACEMENT  NOTE  Date:  03/04/2016  Patient Details  Name: Robyn Porter MRN: [id number] Date of Birth: 17-Jan-1951  Clinical Social Work is seeking post-discharge placement for this patient at the [place] level of care (*CSW will initial, date and re-position this form in  chart as items are completed):  Yes   Patient/family provided with [place] Work Department's list of facilities offering this level of care within the geographic area requested by the patient (or if unable, by the patient's family).  Yes   Patient/family informed of their freedom to choose among providers that offer the needed level of care, that participate in Medicare, Medicaid or managed care program needed by the patient, have an available bed and are willing to accept the patient.  Yes   Patient/family informed of [facility]'s ownership interest in [facility] and [facility], as well as of the fact that they are under no obligation to receive care at these facilities.  PASRR submitted to EDS on 03/04/16     PASRR number received on       Existing PASRR number confirmed on 03/04/16     FL2 transmitted to all facilities in geographic area requested by pt/family on 03/04/16     FL2 transmitted to all facilities within larger geographic area on       Patient informed that his/her managed care company has contracts with or will negotiate with certain facilities, including the following:            Patient/family informed of bed offers received.  Patient chooses bed at       Physician recommends and patient chooses bed at      Patient to be transferred to   on  .  Patient to be transferred to facility by       Patient family notified on   of transfer.  Name of family member notified:        PHYSICIAN Please sign FL2     Additional Comment:    _______________________________________________ Candie Chroman, LCSW 03/04/2016,  2:36 PM

## 2016-03-04 NOTE — Progress Notes (Signed)
Pt transferred to room 2h07, report and care of pt given to [facility]

## 2016-03-04 NOTE — NC FL2 (Signed)
[place] LEVEL OF CARE SCREENING TOOL     IDENTIFICATION  Patient Name: Robyn Porter Birthdate: 12/23/1950 Sex: female Admission Date (Current Location): 03/01/2016  [facility] and [place] Number:   ([place])   Facility and Address:  [facility], [place] [address] [place], [place] [address]      Provider Number: [id number]  Attending Physician Name and Address:  Jolaine Artist, MD  Relative Name and Phone Number:       Current Level of Care: Hospital Recommended Level of Care: [place] Prior Approval Number:    Date Approved/Denied:   PASRR Number: [id number]  Discharge Plan: SNF    Current Diagnoses: Patient Active Problem List   Diagnosis Date Noted  . Acute on chronic diastolic CHF (congestive heart failure) ([place]) 03/17/2016  . Normochromic normocytic anemia 02/11/2016  . Essential hypertension 12/28/2015  . CKD stage 4 due to type 2 diabetes mellitus ([place]) 12/28/2015  . AVM (arteriovenous malformation) of colon   . Melena 11/28/2015  . GI bleed 11/28/2015  . Chronic systolic CHF (congestive heart failure) ([place]) 07/09/2015  . Obesity hypoventilation syndrome ([place]) 07/09/2015  . Proteus mirabilis infection 07/08/2015  . HLD (hyperlipidemia) 07/08/2015  . Gout 07/08/2015  . Vitamin D deficiency 07/08/2015  . Asthma, mild   . Internal bleeding hemorrhoids   . Benign neoplasm of transverse colon   . Chronic anticoagulation   . Morbid obesity ([place])   . NSVT (nonsustained ventricular tachycardia) ([place])   . CKD (chronic kidney disease) stage 4, GFR 15-29 ml/min (HCC)   . Cardiomyopathy ([place])   . Chronic atrial fibrillation ([place]) 06/13/2015  . Diabetes mellitus type 2 in obese ([place]) 06/13/2015  . History of pacemaker 06/13/2015  . Anemia of chronic disease 06/13/2015  . OSA (obstructive sleep apnea) 06/13/2015    Orientation RESPIRATION BLADDER Height & Weight     Self, Time, Situation, Place   Normal Continent, Indwelling catheter Weight: (!) 359 lb 3.2 oz (162.9 kg) Height:  5\' 4"  (162.6 cm)  BEHAVIORAL SYMPTOMS/MOOD NEUROLOGICAL BOWEL NUTRITION STATUS   (None)  (None) Continent Diet (Heart healthy/carb-modified)  AMBULATORY STATUS COMMUNICATION OF NEEDS Skin   Limited Assist Verbally Skin abrasions, Bruising, Other (Comment) (Skin tear)                       Personal Care Assistance Level of Assistance  Bathing, Feeding, Dressing Bathing Assistance: Limited assistance Feeding assistance: Independent Dressing Assistance: Limited assistance     Functional Limitations Info  Sight, Hearing, Speech Sight Info: Adequate Hearing Info: Adequate Speech Info: Adequate    SPECIAL CARE FACTORS FREQUENCY  PT (By licensed PT), Diabetic urine testing, Blood pressure, OT (By licensed OT)     PT Frequency: 5 x week OT Frequency: 5 x week            Contractures Contractures Info: Not present    Additional Factors Info  Code Status, Allergies Code Status Info: Full Allergies Info: Adhesive (Tape), Amoxicillin, Contrast Media Iodinated Diagnostic Agents, Shellfish-derived Products, Benadryl Diphenhydramine Hcl           Current Medications (03/04/2016):  This is the current hospital active medication list Current Facility-Administered Medications  Medication Dose Route Frequency Provider Last Rate Last Dose  . 0.9 %  sodium chloride infusion  250 mL Intravenous PRN Satira Mccallum Tillery, PA-C      . acetaminophen (TYLENOL) tablet 325-650 mg  325-650 mg Oral Q6H PRN Shirley Friar,  PA-C   650 mg at 03/04/16 0919  . albuterol (PROVENTIL) (2.5 MG/3ML) 0.083% nebulizer solution 2.5 mg  2.5 mg Nebulization Q8H PRN Satira Mccallum Tillery, PA-C      . allopurinol (ZYLOPRIM) tablet 50 mg  50 mg Oral Daily Shirley Friar, PA-C   50 mg at 03/04/16 [id number]  . atorvastatin (LIPITOR) tablet 20 mg  20 mg Oral q1800 Satira Mccallum Tillery, PA-C   20 mg at 03/03/16 1727   . chlorhexidine (PERIDEX) 0.12 % solution 15 mL  15 mL Mouth Rinse BID Jolaine Artist, MD   15 mL at 03/04/16 1058  . cholecalciferol (VITAMIN D) tablet 5,000 Units  5,000 Units Oral Daily Shirley Friar, PA-C   5,000 Units at 03/04/16 [id number]  . ferumoxytol (FERAHEME) 510 mg in sodium chloride 0.9 % 100 mL IVPB  510 mg Intravenous Once Jamal Maes, MD      . furosemide (LASIX) 160 mg in dextrose 5 % 50 mL IVPB  160 mg Intravenous Q8H Jolaine Artist, MD   160 mg at 03/04/16 0600  . guaiFENesin (MUCINEX) 12 hr tablet 600 mg  600 mg Oral BID Jolaine Artist, MD   600 mg at 03/04/16 0919  . guaiFENesin-dextromethorphan (ROBITUSSIN DM) 100-10 MG/5ML syrup 5 mL  5 mL Oral Q4H PRN Jolaine Artist, MD   5 mL at 03/04/16 0527  . heparin 10,000 units/ 20 mL infusion syringe  250-3,000 Units/hr CRRT Continuous Jamal Maes, MD      . heparin bolus via infusion syringe 1,000 Units  1,000 Units CRRT PRN Jamal Maes, MD      . heparin injection 1,000-6,000 Units  1,000-6,000 Units CRRT PRN Jamal Maes, MD   2,400 Units at 03/04/16 1400  . heparinized saline (2000 units/L) primer fluid for CRRT   CRRT PRN Jamal Maes, MD      . ipratropium-albuterol (DUONEB) 0.5-2.5 (3) MG/3ML nebulizer solution 3 mL  3 mL Inhalation Q6H PRN Satira Mccallum Tillery, PA-C      . loratadine (CLARITIN) tablet 10 mg  10 mg Oral Daily Shirley Friar, PA-C   10 mg at 03/04/16 [id number]  . MEDLINE mouth rinse  15 mL Mouth Rinse q12n4p Jolaine Artist, MD   15 mL at 03/03/16 1600  . metolazone (ZAROXOLYN) tablet 5 mg  5 mg Oral BID Jolaine Artist, MD   5 mg at 03/04/16 [id number]  . milrinone (PRIMACOR) 20 MG/100 ML (0.2 mg/mL) infusion  0.25 mcg/kg/min Intravenous Continuous Amy D Clegg, NP 13.7 mL/hr at 03/04/16 1300 0.25 mcg/kg/min at 03/04/16 1300  . mometasone-formoterol (DULERA) 100-5 MCG/ACT inhaler 2 puff  2 puff Inhalation BID Shirley Friar, PA-C   2 puff at 03/04/16 0834  .  multivitamin with minerals tablet 1 tablet  1 tablet Oral Daily Shirley Friar, [place]   1 tablet at 03/04/16 0919  . ondansetron (ZOFRAN) injection 4 mg  4 mg Intravenous Q6H PRN Satira Mccallum Tillery, PA-C      . potassium chloride SA (K-DUR,KLOR-CON) CR tablet 60 mEq  60 mEq Oral TID Jolaine Artist, MD   60 mEq at 03/04/16 0919  . prismasol BGK 4/2.5 5,000 mL dialysis replacement fluid   CRRT Continuous Jamal Maes, MD      . prismasol BGK 4/2.5 5,000 mL dialysis replacement fluid   CRRT Continuous Jamal Maes, MD      . prismasol BGK 4/2.5 5,000 mL dialysis solution   CRRT Continuous Jamal Maes, MD      .  sodium chloride flush (NS) 0.9 % injection 10-40 mL  10-40 mL Intracatheter Q12H Larey Dresser, MD   10 mL at 03/04/16 1000  . sodium chloride flush (NS) 0.9 % injection 10-40 mL  10-40 mL Intracatheter PRN Larey Dresser, MD      . sodium chloride flush (NS) 0.9 % injection 3 mL  3 mL Intravenous Q12H Satira Mccallum Tillery, PA-C   3 mL at 03/04/16 1000  . sodium chloride flush (NS) 0.9 % injection 3 mL  3 mL Intravenous PRN Satira Mccallum Tillery, PA-C      . vitamin B-12 (CYANOCOBALAMIN) tablet 500 mcg  500 mcg Oral Daily Shirley Friar, PA-C   500 mcg at 03/04/16 [id number]  . vitamin C (ASCORBIC ACID) tablet 500 mg  500 mg Oral BID Satira Mccallum Tillery, PA-C   500 mg at 03/04/16 [id number]     Discharge Medications: Please see discharge summary for a list of discharge medications.  Relevant Imaging Results:  Relevant Lab Results:   Additional Information SS#: [id number]  Candie Chroman, LCSW

## 2016-03-04 NOTE — Progress Notes (Signed)
[facility] Brief Note  Pt with chronic SHF (EF 40-45%) AS, AF, pacemaker, OHV/OSA, RV dysfunction. Baseline CKD 4 (creatinine mid 2's) Admitted for 3rd time this year with weight gain (around 30lb), massive volume overload, CHF exacerbation. Refractory to ceiling doses diuretics this time CRRT probably best option for slow fluid removal/unloading. Pt agreeable. Discussed with cardiology They will ask CCM to place line All 4K fluids, UF goal neg 200/hour Heparin  Will discuss possible transfusion with Dr. Aundra Dubin IV fe for low TSat  Full note to follow.  Jamal Maes, MD [facility] [phone] Pager 03/04/2016, 8:03 AM

## 2016-03-04 NOTE — Progress Notes (Signed)
Advanced Heart Failure Rounding Note   Subjective:    Admitted with marked volume overload. Continues with sluggish diuresis despite addition of milrinone and switch to lasix gtt.   CVP remains 20-21. Co-ox 74.5  Frustrated. Watching intake and peeing "a lot" but neither weight, swelling, nor CVP improving.   Creatinine 2.7>2.6>2.6>2.6>2.47> 2.44 K 3.3  Some improvement, negative -1.7 L. Weights unchanged.  Echo: EF 60-65%, severe LVH, moderate AS with mean gradient 32 mmHg, RV not well visualized.   Objective:   Weight Range:  Vital Signs:   Temp:  [97.4 F (36.3 C)-97.9 F (36.6 C)] 97.6 F (36.4 C) (11/07 0448) Pulse Rate:  [62-67] 67 (11/07 0448) Resp:  [14-18] 18 (11/07 0448) BP: (99-129)/(37-49) 125/49 (11/07 0448) SpO2:  [97 %-100 %] 100 % (11/07 0448) Weight:  [359 lb 3.2 oz (162.9 kg)] 359 lb 3.2 oz (162.9 kg) (11/07 0500) Last BM Date: 03/03/16  Weight change: Filed Weights   03/02/16 0500 03/03/16 0500 03/04/16 0500  Weight: (!) 358 lb 14.5 oz (162.8 kg) (!) 359 lb 8 oz (163.1 kg) (!) 359 lb 3.2 oz (162.9 kg)    Intake/Output:   Intake/Output Summary (Last 24 hours) at 03/04/16 0720 Last data filed at 03/04/16 0600  Gross per 24 hour  Intake            816.1 ml  Output             2600 ml  Net          -1783.9 ml     Physical Exam: CVP 21 General:  Chronically ill appearing No resp difficulty. In bed  HEENT: normal Neck: supple. JVP elevated to ear. Carotids 2+ bilat; no bruits. No thyromegaly or lymphadenopathy noted.  Cor: PMI nondisplaced. Regular. 3/6 systolic crescendo-decrescendo murmur RUSB.   Lungs: Decrease basilar sounds.   Abdomen: obese, soft, NT, ND, no HSM. No bruits or masses. +BS  Extremities: no cyanosis, clubbing, rash, BLE 2-3+ edema into thighs. RUE PICC Neuro: alert & orientedx3, cranial nerves grossly intact. moves all 4 extremities w/o difficulty. Affect pleasant  Telemetry: Reviewed personally, underlying atrial  fibrillation with V-pacing in 60s     Labs: Basic Metabolic Panel:  Recent Labs Lab 03/15/2016 1754 02/29/16 0520 03/01/16 0500 03/02/16 0445 03/03/16 0504 03/04/16 0312  NA 140 138 137 135 133* 137  K 4.6 4.5 3.5 3.2* 3.2* 3.3*  CL 106 107 106 102 99* 102  CO2 23 22 23 23 23 23   GLUCOSE 118* 84 95 158* 192* 102*  BUN 68* 66* 65* 65* 63* 68*  CREATININE 2.76* 2.60* 2.57* 2.60* 2.47* 2.44*  CALCIUM 9.2 9.0 8.9 8.8* 8.7* 8.9  MG 2.2  --   --   --   --  2.2    Liver Function Tests:  Recent Labs Lab 03/07/2016 1754  AST 25  ALT 13*  ALKPHOS 66  BILITOT 1.4*  PROT 6.5  ALBUMIN 2.6*   No results for input(s): LIPASE, AMYLASE in the last 168 hours. No results for input(s): AMMONIA in the last 168 hours.  CBC:  Recent Labs Lab 02/29/16 0520 03/01/16 0500 03/02/16 0445 03/03/16 0504 03/04/16 0312  WBC 3.5* 3.1* 4.2 5.2 5.8  NEUTROABS 1.7 1.7 2.3 3.3 3.8  HGB 6.6* 9.6* 8.5* 8.4* 7.6*  HCT 22.4* 31.5* 27.6* 27.7* 24.7*  MCV 107.7* 102.3* 102.2* 102.2* 102.9*  PLT 138* 105* 122* 135* 120*    Cardiac Enzymes: No results for input(s): CKTOTAL, CKMB, CKMBINDEX, TROPONINI  in the last 168 hours.  BNP: BNP (last 3 results)  Recent Labs  07/09/15 1158 07/19/15 1124 03/22/2016 1754  BNP 133.0* 149.0* 429.4*    ProBNP (last 3 results) No results for input(s): PROBNP in the last 8760 hours.    Other results:  Imaging: No results found.   Medications:     Scheduled Medications: . allopurinol  50 mg Oral Daily  . atorvastatin  20 mg Oral q1800  . chlorhexidine  15 mL Mouth Rinse BID  . cholecalciferol  5,000 Units Oral Daily  . ferrous sulfate  325 mg Oral TID WC  . furosemide  160 mg Intravenous Q8H  . guaiFENesin  600 mg Oral BID  . loratadine  10 mg Oral Daily  . mouth rinse  15 mL Mouth Rinse q12n4p  . metolazone  5 mg Oral BID  . mometasone-formoterol  2 puff Inhalation BID  . multivitamin with minerals  1 tablet Oral Daily  . potassium chloride  SA  60 mEq Oral TID  . rivaroxaban  15 mg Oral Q supper  . sodium chloride flush  10-40 mL Intracatheter Q12H  . sodium chloride flush  3 mL Intravenous Q12H  . cyanocobalamin  500 mcg Oral Daily  . vitamin C  500 mg Oral BID    Infusions: . milrinone 0.25 mcg/kg/min (03/04/16 0200)    PRN Medications: sodium chloride, acetaminophen, albuterol, guaiFENesin-dextromethorphan, ipratropium-albuterol, ondansetron (ZOFRAN) IV, sodium chloride flush, sodium chloride flush   Assessment/Plan/Discussion    Robyn Porter is a 65 y.o. female with history with chronic systolic CHF, Anemia, OHS/OSA, symptomatic bradycardia s/p MDT PPm with CRT-P upgrade given high percentage of RV pacing, CKD stage IV.  Admitted with marked volume overload.  1. Chronic diastolic CHF: EF [id number] on echo this admission, RV poorly visualized. Recent upgrade to [employer] CRT given constant RV pacing. Volume status markedly elevated. Weight up 30 lbs since August.  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  Renal function stable.  - Continue milrinone 0.25 mcg for RV support. BP stable.   - Continue lasix 160 mg IV q 8 hrs with metolazone 5 mg BID. Consult Renal.  - Off BB with low coox on admission.  2. Symptomatic bradycardia: MDT PPM. Upgraded to CRT given high percentage RV pacing.  3. Acute on CKD Stage IV: Creatinine baseline 2 -2.3, stable at 2.47 today. - Will support with milrinone. - Renal follows as outpatient. With on-going failure to diurese, will consult to consider CVVH.  4. Chronic atrial fibrillation:  - Hold Toprol XL for now with low output. - Remains on Xarelto.  5. OHS/OSA:Continue nightly CPAP.  6. 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 Given 1UPRBC on admit.  - States she has occasional hemorrhoidal bleed - Has been followed by Dr. Lindi Adie who is working up anemia - Hgb 8.4 -> 7.6.  This is near her recent baseline. Will follow for today.  7. Chronic  venous stasis:  - Bilateral venous stasis changes, stable. No cellulitis noted.  8. Morbid obesity:  - PT following for deconditioning.  Recommending HHPT TENTATIVELY.  If pt remains at current level of deconditioning, recommending SNF.    9. Hypokalemia - Continue to Supp K.  She has poor absorption.  10. Aortic stenosis: Moderate by echo 03/02/16.    On-going poor/failure to diurese.  Will consult renal to consider CVVHD.   Length of Stay: [address]  Robyn Porter 03/04/2016, 7:20 AM  Advanced Heart Failure Team Pager [phone] (  M-F; 7a - 4p)  Please contact [facility] for night-coverage after hours (4p -7a ) and weekends on [email or web address]  Patient seen with PA, agree with the above note.  Ongoing poor diuresis despite high dose diuretics.  Renal function stable with CVP > 20 and weight unchanged.  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  Discussed with renal today, plan CVVH for fluid removal.  Hopefully, significant fluid removal will unload the RV and allow improved function.  Long-term, however, it is certainly possible that she may need HD.    Hold Xarelto for line placement, will get heparin after CVVH started.   Follow hemoglobin, no overt bleeding, transfuse hgb < 7.   35 minutes critical care time.   [name] 03/04/2016 8:03 AM

## 2016-03-04 NOTE — Consult Note (Signed)
[facility] Consultation Note Requesting Physician:  Dr. Aundra Dubin Primary Nephrologist: Dr. Hollie Salk Reason for Consult:  Diuretic refractory volume overload in pt with CKD4 and dCHF.  HPI: The patient is a 65 y.o. 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 Hollie Salk at [facility] just once; baseline has been worsening over course of 2017),  super morbid obesity, OHS/OSA (CPAP), dCHF, AS, pacemaker, AF. Has been admitted at least 3X this year with massive volume overload (diuresed 27 kg during a 05/2015 admission, 5.5 liters May admission (had pacer upgrade), August diuresed to 333 lb (not sure how much weight lost).  On the current occasion she is admitted with a 30 lb weight increase since August, SOB, worsening edema. Has rec'd IV lasix 160 every 8 + metolazone 5 BID + milrinone and has failed to achieve a negative fluid balance. Creatinine has been 2.4-2.7 during the admission.   We are asked to see for the possibility of CRRT. In speaking with her she is agreeable, and acknowledges that she could end up on dialysis permanently if we cannot manage her fluids with diuretics after we get her unloaded with CRRT.   Her other issues include a very significant anemia, Hb today down to 7.4, has had extensive GI workup 11/2015 with a few colonic (cecal) AVM's, and capsule endo with blood in distal TI and cecum, but site not identified. Has required transfusion, including 2 units during the current admission. She is chronically anticoagulated for AF.  Creatinine summary for reference: Creatinine, Ser  Date/Time Value Ref Range Status  03/04/2016 03:12 AM 2.44 (H) 0.44 - 1.00 mg/dL Final  03/03/2016 05:04 AM 2.47 (H) 0.44 - 1.00 mg/dL Final  03/02/2016 04:45 AM 2.60 (H) 0.44 - 1.00 mg/dL Final  03/01/2016 05:00 AM 2.57 (H) 0.44 - 1.00 mg/dL Final  02/29/2016 05:20 AM 2.60 (H) 0.44 - 1.00 mg/dL Final  02/29/2016 05:54 PM 2.76 (H) 0.44 - 1.00 mg/dL Final  01/16/2016 03:46 PM 2.48 (H) 0.44 - 1.00  mg/dL Final  12/28/2015 11:13 AM 2.33 (H) 0.44 - 1.00 mg/dL Final  12/27/2015 08:04 PM 2.38 (H) 0.44 - 1.00 mg/dL Final  12/11/2015 04:50 AM 2.58 (H) 0.44 - 1.00 mg/dL Final  12/10/2015 05:02 AM 2.45 (H) 0.44 - 1.00 mg/dL Final  12/09/2015 03:30 AM 2.39 (H) 0.44 - 1.00 mg/dL Final  12/08/2015 05:25 AM 2.20 (H) 0.44 - 1.00 mg/dL Final  12/07/2015 08:52 AM 2.16 (H) 0.44 - 1.00 mg/dL Final  12/06/2015 06:10 AM 2.19 (H) 0.44 - 1.00 mg/dL Final  12/05/2015 05:20 AM 2.09 (H) 0.44 - 1.00 mg/dL Final  12/04/2015 04:30 AM 2.06 (H) 0.44 - 1.00 mg/dL Final  12/03/2015 01:08 PM 2.00 (H) 0.44 - 1.00 mg/dL Final  12/03/2015 04:15 AM 1.97 (H) 0.44 - 1.00 mg/dL Final  12/02/2015 04:30 AM 2.06 (H) 0.44 - 1.00 mg/dL Final  12/01/2015 05:00 AM 2.11 (H) 0.44 - 1.00 mg/dL Final  11/30/2015 05:07 AM 2.31 (H) 0.44 - 1.00 mg/dL Final  11/29/2015 04:57 AM 2.43 (H) 0.44 - 1.00 mg/dL Final  11/28/2015 03:51 PM 2.35 (H) 0.44 - 1.00 mg/dL Final  10/25/2015 02:15 PM 2.51 (H) 0.44 - 1.00 mg/dL Final  09/21/2015 05:59 AM 2.27 (H) 0.44 - 1.00 mg/dL Final  09/20/2015 05:22 AM 2.20 (H) 0.44 - 1.00 mg/dL Final  08/30/2015 04:04 PM 2.19 (H) 0.44 - 1.00 mg/dL Final  07/19/2015 11:24 AM 1.94 (H) 0.44 - 1.00 mg/dL Final  07/09/2015 11:57 AM 2.03 (H) 0.44 - 1.00 mg/dL Final  06/30/2015 05:20 AM 2.26 (H) 0.44 - 1.00 mg/dL Final  06/29/2015 05:30 AM 2.25 (H) 0.44 - 1.00 mg/dL Final  06/28/2015 05:30 AM 2.39 (H) 0.44 - 1.00 mg/dL Final  06/27/2015 05:03 AM 2.41 (H) 0.44 - 1.00 mg/dL Final  06/26/2015 05:02 AM 2.41 (H) 0.44 - 1.00 mg/dL Final  06/25/2015 04:55 AM 2.22 (H) 0.44 - 1.00 mg/dL Final  06/24/2015 05:28 AM 2.19 (H) 0.44 - 1.00 mg/dL Final  06/23/2015 04:45 AM 2.15 (H) 0.44 - 1.00 mg/dL Final  06/22/2015 06:12 AM 1.89 (H) 0.44 - 1.00 mg/dL Final  06/21/2015 04:50 AM 1.92 (H) 0.44 - 1.00 mg/dL Final  06/20/2015 05:10 AM 1.79 (H) 0.44 - 1.00 mg/dL Final  06/19/2015 05:20 AM 1.85 (H) 0.44 - 1.00 mg/dL Final  06/18/2015  04:55 AM 1.81 (H) 0.44 - 1.00 mg/dL Final  06/17/2015 04:45 AM 1.88 (H) 0.44 - 1.00 mg/dL Final  06/16/2015 05:10 AM 1.84 (H) 0.44 - 1.00 mg/dL Final  06/15/2015 05:30 AM 1.87 (H) 0.44 - 1.00 mg/dL Final  06/14/2015 05:40 AM 1.93 (H) 0.44 - 1.00 mg/dL Final  06/13/2015 09:16 AM 1.96 (H) 0.44 - 1.00 mg/dL Final  06/12/2015 07:38 PM 1.86 (H) 0.44 - 1.00 mg/dL Final    Past Medical History:  Diagnosis Date  . Afib ([place])   . Aortic stenosis   . CHF (congestive heart failure) ([place])   . Chronic kidney disease    Stage 3  . COPD (chronic obstructive pulmonary disease) ([place])   . Diabetes mellitus without complication ([place])   . Hypertension   . Pacemaker      Past Surgical History:  Procedure Laterality Date  . ABDOMINAL HYSTERECTOMY    . BIV PACEMAKER GENERATOR CHANGE OUT  09/19/2015  . COLONOSCOPY N/A 06/29/2015   Procedure: COLONOSCOPY;  Surgeon: Irene Shipper, MD;  Location: [facility] ENDOSCOPY;  Service: Endoscopy;  Laterality: N/A;  . COLONOSCOPY N/A 12/03/2015   Procedure: COLONOSCOPY;  Surgeon: Gatha Mayer, MD;  Location: [place];  Service: Endoscopy;  Laterality: N/A;  . EP IMPLANTABLE DEVICE N/A 09/19/2015   Procedure: BiV Pacemaker Insertion CRT-P;  Surgeon: Will [name], MD;  Location: [place] [facility];  Service: Cardiovascular;  Laterality: N/A;  . ESOPHAGOGASTRODUODENOSCOPY N/A 11/29/2015   Procedure: ESOPHAGOGASTRODUODENOSCOPY (EGD);  Surgeon: Manus Gunning, MD;  Location: [place];  Service: Gastroenterology;  Laterality: N/A;  . GIVENS CAPSULE STUDY N/A 11/30/2015   Procedure: GIVENS CAPSULE STUDY;  Surgeon: Manus Gunning, MD;  Location: [place];  Service: Gastroenterology;  Laterality: N/A;  . HOT HEMOSTASIS N/A 12/03/2015   Procedure: HOT HEMOSTASIS (ARGON PLASMA COAGULATION/BICAP);  Surgeon: Gatha Mayer, MD;  Location: [facility] ENDOSCOPY;  Service: Endoscopy;  Laterality: N/A;  . PACEMAKER INSERTION       Family History  Problem Relation Age  of Onset  . Angina Sister   . Emphysema Sister   . Colon cancer Brother    Social History:  reports that she is a non-smoker but has been exposed to tobacco smoke. She has never used smokeless tobacco. She reports that she does not drink alcohol or use drugs.   Allergies  Allergen Reactions  . Adhesive [Tape] Other (See Comments)    Tears skin.  Please use "paper" tape  . Amoxicillin Hives  . Contrast Media [Iodinated Diagnostic Agents] Hives  . Shellfish-Derived Products Hives    Can eat crab cakes  . Benadryl [Diphenhydramine Hcl] Rash     Prior to Admission medications   Medication Sig Start  Date End Date Taking? Authorizing Provider  acetaminophen (TYLENOL) 325 MG tablet Take 1-2 tablets (325-650 mg total) by mouth every 6 (six) hours as needed for mild pain. 09/21/15  Yes Shirley Friar, PA-C  albuterol (PROVENTIL HFA;VENTOLIN HFA) 108 (90 Base) MCG/ACT inhaler Inhale 1 puff into the lungs every 6 (six) hours as needed for wheezing or shortness of breath.   Yes Historical Provider, MD  albuterol (PROVENTIL) (2.5 MG/3ML) 0.083% nebulizer solution Take 2.5 mg by nebulization every 8 (eight) hours as needed for wheezing.  05/22/15  Yes Historical Provider, MD  allopurinol (ZYLOPRIM) 100 MG tablet Take 50 mg by mouth daily.    Yes Historical Provider, MD  atorvastatin (LIPITOR) 20 MG tablet Take 20 mg by mouth daily.   Yes Historical Provider, MD  budesonide-formoterol (SYMBICORT) 80-4.5 MCG/ACT inhaler Inhale 2 puffs into the lungs 2 (two) times daily.   Yes Historical Provider, MD  Cholecalciferol (VITAMIN D3) 5000 units CAPS Take 1 capsule by mouth daily.   Yes Historical Provider, MD  cyanocobalamin 500 MCG tablet Take 500 mcg by mouth daily.   Yes Historical Provider, MD  Exenatide ER (BYDUREON) 2 MG PEN Inject 2 mg into the skin every Saturday. Take every Saturday. Patient states she taken today (02/29/2016) because she forgot to take it this past Saturday   Yes Historical  Provider, MD  ferrous sulfate 325 (65 FE) MG tablet Take 1 tablet (325 mg total) by mouth 3 (three) times daily with meals. 12/29/15  Yes Orson Eva, MD  ipratropium-albuterol (DUONEB) 0.5-2.5 (3) MG/3ML SOLN Inhale 3 mLs into the lungs every 6 (six) hours as needed (for shortness of breath).  09/15/15  Yes Historical Provider, MD  loratadine (CLARITIN) 10 MG tablet Take 10 mg by mouth daily.   Yes Historical Provider, MD  metolazone (ZAROXOLYN) 2.5 MG tablet Take 1 tablet (2.5 mg total) by mouth as needed (3 lb weight gain overnight or 5lb week gain in a week). As directed by HF clinic. 12/11/15  Yes Shirley Friar, PA-C  metoprolol succinate (TOPROL-XL) 25 MG 24 hr tablet Take 1 tablet (25 mg total) by mouth daily. 12/12/15  Yes Larey Dresser, MD  Multiple Vitamin (MULTIVITAMIN WITH MINERALS) TABS tablet Take 1 tablet by mouth daily. Reported on 07/09/2015   Yes Historical Provider, MD  potassium chloride SA (K-DUR,KLOR-CON) 20 MEQ tablet Take 2 tablets (40 mEq total) by mouth 2 (two) times daily. Take an extra 40 meq when you take Metolazone 12/11/15  Yes Satira Mccallum Tillery, PA-C  torsemide (DEMADEX) 20 MG tablet Take 4 tablets (80 mg total) by mouth 2 (two) times daily. 12/11/15  Yes Shirley Friar, PA-C  vitamin C (ASCORBIC ACID) 500 MG tablet Take 1 tablet (500 mg total) by mouth 2 (two) times daily. 12/29/15  Yes Orson Eva, MD  XARELTO 15 MG TABS tablet TAKE 1 TABLET BY MOUTH EVERY DAY WITH SUPPER 02/19/16  Yes Larey Dresser, MD    Inpatient medications: . allopurinol  50 mg Oral Daily  . atorvastatin  20 mg Oral q1800  . chlorhexidine  15 mL Mouth Rinse BID  . cholecalciferol  5,000 Units Oral Daily  . furosemide  160 mg Intravenous Q8H  . guaiFENesin  600 mg Oral BID  . loratadine  10 mg Oral Daily  . mouth rinse  15 mL Mouth Rinse q12n4p  . metolazone  5 mg Oral BID  . mometasone-formoterol  2 puff Inhalation BID  . multivitamin with minerals  1  tablet Oral Daily  .  potassium chloride SA  60 mEq Oral TID  . sodium chloride flush  10-40 mL Intracatheter Q12H  . sodium chloride flush  3 mL Intravenous Q12H  . cyanocobalamin  500 mcg Oral Daily  . vitamin C  500 mg Oral BID    Review of Systems Gen:  Denies headache, fever, chills, sweats.  No weight loss. + 30 lb weight gain with worsening edema since 11/2015 HEENT:  No visual change, sore throat, difficulty swallowing. Resp:  No difficulty breathing, DOE.  + cough, exertional dyspnea Cardiac:  No chest pain, orthopnea Worsening edema of LE's GI:   Denies abdominal pain.   No nausea, vomiting, diarrhea.  No constipation. GU:  Denies difficulty or change in voiding.  No change in urine color.     MS:  Denies joint pain or swelling.   Derm:  Denies skin rash or itching.  No chronic skin conditions.  Neuro:   + leg weakness (says maybe because "legs have gotten too heavy").   Psych:  Denies symptoms of depression of anxiety.  No hallucination.    Physical Exam:  Blood pressure (!) 112/41, pulse (!) 48, temperature 97.4 F (36.3 C), temperature source Oral, resp. rate 17, height 5\' 4"  (1.626 m), weight (!) 162.9 kg (359 lb 3.2 oz), SpO2 98 %.  Very pleasant but MO WF VS as noted Not on O2 +JVD Lungs grossly clear Left sided pacemaker [id number] No S3 2/6 systolic murmur USB heard up into the neck and across precordium Abdomen obese, large pannus 3+ pitting edema both LE's to knees, pitting of thighs PICC line in R arm Clear urine in foley   Recent Labs Lab 03/20/2016 1754 02/29/16 0520 03/01/16 0500 03/02/16 0445 03/03/16 0504 03/04/16 0312  NA 140 138 137 135 133* 137  K 4.6 4.5 3.5 3.2* 3.2* 3.3*  CL 106 107 106 102 99* 102  CO2 23 22 23 23 23 23   GLUCOSE 118* 84 95 158* 192* 102*  BUN 68* 66* 65* 65* 63* 68*  CREATININE 2.76* 2.60* 2.57* 2.60* 2.47* 2.44*  CALCIUM 9.2 9.0 8.9 8.8* 8.7* 8.9     Recent Labs Lab 03/18/2016 1754  AST 25  ALT 13*  ALKPHOS 66  BILITOT 1.4*  PROT 6.5   ALBUMIN 2.6*     Recent Labs Lab 03/01/16 0500 03/02/16 0445 03/03/16 0504 03/04/16 0312  WBC 3.1* 4.2 5.2 5.8  NEUTROABS 1.7 2.3 3.3 3.8  HGB 9.6* 8.5* 8.4* 7.6*  HCT 31.5* 27.6* 27.7* 24.7*  MCV 102.3* 102.2* 102.2* 102.9*  PLT 105* 122* 135* 120*    Iron/TIBC/Ferritin/ %Sat    Component Value Date/Time   IRON 51 02/29/2016 0813   TIBC 367 02/29/2016 0813   FERRITIN 183 02/29/2016 0813   IRONPCTSAT 14 02/29/2016 0813    Background: 65 y.o. 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 Hollie Salk at [facility] just once; baseline has been worsening over course of 2017),  super morbid obesity, OHS/OSA (CPAP), dCHF, AS, pacemaker, AF. Has been admitted at least 3X this year with massive volume overload (diuresed 27 kg during a 05/2015 admission, 5.5 liters May admission (had pacer upgrade), August diuresed to 333 lb (not sure how much weight lost).  On the current occasion she is admitted with a 30 lb weight increase since August, SOB, worsening edema. Has rec'd IV lasix 160 every 8 + metolazone 5 BID + milronone and has failed to achieve a negative  fluid balance. Creatinine has been 2.4-2.7 during the admission.  We are asked to see for the possibility of CRRT. In speaking with her she is agreeable, and acknowledges that she could end up on dialysis permanently if we cannot manage her fluids with diuretics after we get her unloaded with CRRT. For catheter placement 11/7, initiation CRRT  Assessment/Recommendations  1. Diuretic refractory CHF/volume overload - has not achieved neg balance with ceiling doses of lasix/metolazone + inotropic support. I think CRRT best option for volume removal at this point. Hopefully unloading her ventricle will get her back on her Starling curve and facilitate improved diuretic responsiveness. CCM to place catheter when safe (last dose xarelto yesterday evening) then CRRT for net neg 150-200 cc/hour as BP tolerates. All 4K  fluids. 2. Chronic dCHF. EF 60-65%. Suspicion primary RV failure. Per cards 3. Pacemaker status 4. AFib - has been on xarelto. Will get heparin with CRRT and pharmacy will monitor heparin levels re need for additional systemic once CRRT starts 5. Hypokalemia - replacement already ordered by Dr. Aundra Dubin 6. CKD4 -I note that her baseline creatinine has been worsening over the course of 2017 - now around 2.4-2.6. It is entirely possible that she will end up dialysis dependent if, after getting her volume down with CRRT her fluids can't be managed with diuretics. Can we please remove the PICC line from her R arm?  It uses up future vascular access sites 7. Anemia - h/o AVM's. Fe deficient. Dose with feraheme. Transfuse today. 8. Metabolic bone - check PTH, Ca and phos 9. OHS/OSA - CPAP at night. Has own machine. 10. Super morbid obesity. Need to keep her mobile.   Jamal Maes,  MD [facility] [phone] pager 03/04/2016, 12:47 PM

## 2016-03-04 NOTE — Progress Notes (Signed)
[facility] Afternoon Rounding Note  Temp cath placed R IJ by CCM Waiting on CXR to confirm placement so that CRRT can be initiated.  Jamal Maes, MD [facility] [phone] Pager 03/04/2016, 4:38 PM

## 2016-03-04 NOTE — Progress Notes (Signed)
Physical Therapy Treatment Patient Details Name: Robyn Porter MRN: [id number] DOB: Nov 05, 1950 Today's Date: 03/04/2016    History of Present Illness Pt adm with significant volume overload. Attempts to diurese not working well and pt to now start on CVVHD on 11/7. PMH - chronic systolic heart failure, htn, dm, pacer, copd, ckd    PT Comments    Pt not bouncing back as hoped from mobility standpoint. She had great difficulty getting back to bed from chair yesterday afternoon with her knees giving way. Today she can stand but unable to lift legs to take a step. Pt to start on CVVHD soon. With all of the above feel it is less likely that pt will recover enough to return home at [place] and will likely need ST-SNF. Also pt's nephew that she lives with has a broken foot and broken ribs now.  Follow Up Recommendations  SNF     Equipment Recommendations  None recommended by PT    Recommendations for Other Services       Precautions / Restrictions Precautions Precautions: Fall Restrictions Weight Bearing Restrictions: No    Mobility  Bed Mobility Overal bed mobility: Needs Assistance Bed Mobility: Supine to Sit;Sit to Supine     Supine to sit: Min assist Sit to supine: Mod assist   General bed mobility comments: Assist to elevate trunk into sitting and assist to bring legs back up into bed when returning to supine.  Transfers Overall transfer level: Needs assistance Equipment used: Rolling walker (2 wheeled) Transfers: Sit to/from Stand Sit to Stand: +2 physical assistance;Mod assist         General transfer comment: Assist to bring hips and trunk up. Pt needs incr time and effort to rise  Ambulation/Gait Ambulation/Gait assistance: +2 physical assistance;Min assist Ambulation Distance (Feet): 2 Feet Assistive device: Rolling walker (2 wheeled) Gait Pattern/deviations: Shuffle Gait velocity: decr Gait velocity interpretation: Below normal speed for age/gender General  Gait Details: Pt side stepping up toward head of the bed. Pt unable to lift rt foot and was having to slide/wiggle foot over   Stairs            Wheelchair Mobility    Modified Rankin (Stroke Patients Only)       Balance Overall balance assessment: Needs assistance Sitting-balance support: No upper extremity supported Sitting balance-Leahy Scale: Fair       Standing balance-Leahy Scale: Poor Standing balance comment: walker and min A for static standing. Pt stood x 2 for ~45 sec each time. Pt reports knees become very weak and wobbly.                    Cognition Arousal/Alertness: Awake/alert Behavior During Therapy: WFL for tasks assessed/performed Overall Cognitive Status: Within Functional Limits for tasks assessed                      Exercises      General Comments        Pertinent Vitals/Pain Pain Assessment: No/denies pain    Home Living                      Prior Function            PT Goals (current goals can now be found in the care plan section) Acute Rehab PT Goals Patient Stated Goal: return home Progress towards PT goals: Not progressing toward goals - comment (medical issues)    Frequency    Min 3X/week  PT Plan Discharge plan needs to be updated    Co-evaluation             End of Session Equipment Utilized During Treatment: Oxygen Activity Tolerance: Patient limited by fatigue Patient left: in chair;with call bell/phone within reach     Time: 0959-1021 PT Time Calculation (min) (ACUTE ONLY): 22 min  Charges:  $Gait Training: 8-22 mins                    G Codes:      Kali Ambler March 16, 2016, 11:53 AM Suanne Marker PT [phone]

## 2016-03-04 NOTE — Clinical Social Work Note (Signed)
Clinical Social Work Assessment  Patient Details  Name: Robyn Porter MRN: [id number] Date of Birth: 04/25/1951  Date of referral:  03/04/16               Reason for consult:  Facility Placement, Discharge Planning                Permission sought to share information with:  Facility Contact Representative Permission granted to share information::  Yes, Verbal Permission Granted  Name::     Claude Bledsoe  Agency::  SNF's  Relationship::  Nephew/Caregiver  Contact Information:  [phone]  Housing/Transportation Living arrangements for the past 2 months:  Single Family Home Source of Information:  Patient, Medical Team Patient Interpreter Needed:  None Criminal Activity/Legal Involvement Pertinent to Current Situation/Hospitalization:  No - Comment as needed Significant Relationships:  Other Family Members Lives with:  Other (Comment) (Nephew and his wife) Do you feel safe going back to the place where you live?  Yes Need for family participation in patient care:  Yes (Comment)  Care giving concerns:  PT has changed their recommendation from HHPT to SNF once medically stable.   Social Worker assessment / plan:  CSW met with patient. No supports at bedside. CSW introduced role and explained that PT recommendations would be discussed. Patient is unsure if she wants to go to SNF but understands that may be her best option right now. Her nephew broke his foot and rib so she understands that he needs time to heal before he can continue taking care of her. Patient has been to [facility] before and enjoyed it. CSW also offered to send to SNF's closer to home in [place]. No further concerns. CSW encouraged patient to contact CSW as needed. CSW will continue to follow patient for support and facilitate discharge to SNF once medically stable, if patient still agreeable.  Employment status:  Retired Insurance information:  Medicare PT Recommendations:  Skilled Nursing  Facility Information / Referral to community resources:  Skilled Nursing Facility  Patient/Family's Response to care:  Patient somewhat agreeable to SNF placement. Patient's family supportive and involved in patient's care. Patient appreciated social work intervention.  Patient/Family's Understanding of and Emotional Response to Diagnosis, Current Treatment, and Prognosis:  Patient understands the need for rehab prior to returning home. Patient appears happy with hospital care.  Emotional Assessment Appearance:  Appears stated age Attitude/Demeanor/Rapport:  Other (Pleasant) Affect (typically observed):  Apprehensive, Calm, Pleasant, Appropriate Orientation:  Oriented to Self, Oriented to Place, Oriented to  Time, Oriented to Situation Alcohol / Substance use:  Never Used Psych involvement (Current and /or in the community):  No (Comment)  Discharge Needs  Concerns to be addressed:  Care Coordination Readmission within the last 30 days:  No Current discharge risk:  Dependent with Mobility Barriers to Discharge:  No Barriers Identified   Sarah C Boswell, LCSW 03/04/2016, 2:33 PM  

## 2016-03-04 NOTE — Procedures (Signed)
Hemodialysis Catheter Insertion Procedure Note Robyn Porter [id number] [birth year]  Procedure: Insertion of Hemodialysis Catheter Indications: Hemodialysis  Procedure Details Consent: Risks of procedure as well as the alternatives and risks of each were explained to the (patient/caregiver).  Consent for procedure obtained.  Time Out: Verified patient identification, verified procedure, site/side was marked, verified correct patient position, special equipment/implants available, medications/allergies/relevent history reviewed, required imaging and test results available.  Performed  Maximum sterile technique was used including antiseptics, cap, gloves, gown, hand hygiene, mask and sheet.  Skin prep: Chlorhexidine; local anesthetic administered  A Trialysis HD catheter was placed in the right internal jugular vein using the Seldinger technique.  Evaluation Blood flow good Complications: No apparent complications Patient did tolerate procedure well. Chest X-ray ordered to verify placement.  CXR: pending.   Procedure performed under direct supervision of Dr. Nelda Marseille  and with ultrasound guidance for real time vessel cannulation.     Robyn Porter, AG-ACNP [facility]  Pgr: [phone]  PCCM Pgr: [id number]  Rush Farmer, M.D. [facility]. Pager: [phone]. After hours pager: [phone].

## 2016-03-05 LAB — POCT ACTIVATED CLOTTING TIME
ACTIVATED CLOTTING TIME: 175 s
ACTIVATED CLOTTING TIME: 175 s
ACTIVATED CLOTTING TIME: 180 s
ACTIVATED CLOTTING TIME: 186 s
ACTIVATED CLOTTING TIME: 186 s
ACTIVATED CLOTTING TIME: 191 s
ACTIVATED CLOTTING TIME: 191 s
Activated Clotting Time: 175 seconds
Activated Clotting Time: 186 seconds
Activated Clotting Time: 186 seconds
Activated Clotting Time: 180 seconds
Activated Clotting Time: 186 seconds
Activated Clotting Time: 191 seconds
Activated Clotting Time: 191 seconds
Activated Clotting Time: 191 seconds

## 2016-03-05 LAB — RENAL FUNCTION PANEL
ANION GAP: 8 (ref 5–15)
Albumin: 2.3 g/dL — ABNORMAL LOW (ref 3.5–5.0)
BUN: 43 mg/dL — ABNORMAL HIGH (ref 6–20)
CALCIUM: 8.4 mg/dL — AB (ref 8.9–10.3)
CHLORIDE: 102 mmol/L (ref 101–111)
CO2: 23 mmol/L (ref 22–32)
Creatinine, Ser: 1.88 mg/dL — ABNORMAL HIGH (ref 0.44–1.00)
GFR calc non Af Amer: 27 mL/min — ABNORMAL LOW (ref >60)
GFR, EST AFRICAN AMERICAN: 31 mL/min — AB (ref >60)
Glucose, Bld: 164 mg/dL — ABNORMAL HIGH (ref 65–99)
Phosphorus: 2.6 mg/dL (ref 2.5–4.6)
Potassium: 3.6 mmol/L (ref 3.5–5.1)
SODIUM: 133 mmol/L — AB (ref 135–145)

## 2016-03-05 LAB — BASIC METABOLIC PANEL
ANION GAP: 12 (ref 5–15)
BUN: 54 mg/dL — ABNORMAL HIGH (ref 6–20)
CO2: 22 mmol/L (ref 22–32)
Calcium: 8.9 mg/dL (ref 8.9–10.3)
Chloride: 103 mmol/L (ref 101–111)
Creatinine, Ser: 2.17 mg/dL — ABNORMAL HIGH (ref 0.44–1.00)
GFR calc Af Amer: 26 mL/min — ABNORMAL LOW (ref >60)
GFR, EST NON AFRICAN AMERICAN: 22 mL/min — AB (ref >60)
Glucose, Bld: 94 mg/dL (ref 65–99)
POTASSIUM: 3.6 mmol/L (ref 3.5–5.1)
SODIUM: 137 mmol/L (ref 135–145)

## 2016-03-05 LAB — CBC WITH DIFFERENTIAL/PLATELET
BASOS ABS: 0 10*3/uL (ref 0.0–0.1)
Basophils Relative: 1 %
EOS ABS: 0.4 10*3/uL (ref 0.0–0.7)
EOS PCT: 8 %
HCT: 26.9 % — ABNORMAL LOW (ref 36.0–46.0)
Hemoglobin: 8.2 g/dL — ABNORMAL LOW (ref 12.0–15.0)
Lymphocytes Relative: 14 %
Lymphs Abs: 0.7 10*3/uL (ref 0.7–4.0)
MCH: 31.5 pg (ref 26.0–34.0)
MCHC: 30.5 g/dL (ref 30.0–36.0)
MCV: 103.5 fL — ABNORMAL HIGH (ref 78.0–100.0)
Monocytes Absolute: 0.8 10*3/uL (ref 0.1–1.0)
Monocytes Relative: 15 %
Neutro Abs: 3.3 10*3/uL (ref 1.7–7.7)
Neutrophils Relative %: 62 %
PLATELETS: 144 10*3/uL — AB (ref 150–400)
RBC: 2.6 MIL/uL — AB (ref 3.87–5.11)
RDW: 20.2 % — ABNORMAL HIGH (ref 11.5–15.5)
WBC: 5.2 10*3/uL (ref 4.0–10.5)

## 2016-03-05 LAB — MAGNESIUM: Magnesium: 2.3 mg/dL (ref 1.7–2.4)

## 2016-03-05 LAB — PARATHYROID HORMONE, INTACT (NO CA): PTH: 50 pg/mL (ref 15–65)

## 2016-03-05 LAB — COOXEMETRY PANEL
CARBOXYHEMOGLOBIN: 2 % — AB (ref 0.5–1.5)
METHEMOGLOBIN: 1 % (ref 0.0–1.5)
O2 Saturation: 69.4 %
TOTAL HEMOGLOBIN: 9.4 g/dL — AB (ref 12.0–16.0)

## 2016-03-05 LAB — PREPARE RBC (CROSSMATCH)

## 2016-03-05 LAB — APTT: aPTT: 100 seconds — ABNORMAL HIGH (ref 24–36)

## 2016-03-05 LAB — ALBUMIN: Albumin: 2.5 g/dL — ABNORMAL LOW (ref 3.5–5.0)

## 2016-03-05 LAB — PHOSPHORUS: PHOSPHORUS: 3.2 mg/dL (ref 2.5–4.6)

## 2016-03-05 LAB — HEPARIN LEVEL (UNFRACTIONATED): Heparin Unfractionated: 1.76 IU/mL — ABNORMAL HIGH (ref 0.30–0.70)

## 2016-03-05 MED ORDER — BENZONATATE 100 MG PO CAPS
100.0000 mg | ORAL_CAPSULE | Freq: Three times a day (TID) | ORAL | Status: DC | PRN
  Administered 2016-03-05 – 2016-03-12 (×13): 100 mg via ORAL
  Filled 2016-03-05 (×13): qty 1

## 2016-03-05 MED ORDER — SODIUM CHLORIDE 0.9 % IV SOLN
Freq: Once | INTRAVENOUS | Status: AC
  Administered 2016-03-05: 13:00:00 via INTRAVENOUS

## 2016-03-05 MED ORDER — DARBEPOETIN ALFA 100 MCG/0.5ML IJ SOSY
100.0000 ug | PREFILLED_SYRINGE | INTRAMUSCULAR | Status: DC
  Administered 2016-03-05 – 2016-03-12 (×2): 100 ug via SUBCUTANEOUS
  Filled 2016-03-05 (×3): qty 0.5

## 2016-03-05 NOTE — Progress Notes (Signed)
Unable to weigh patient at this time. Her bed does not show an accurate weight. She is unable to stand at this time because she is receiving CRRT and the risk for dislodging the catheter.

## 2016-03-05 NOTE — Progress Notes (Signed)
[facility] Rounding Note  Subjective/Interval History:  CRRT started about 5PM yesterday Weights totally inaccurate on current bed Feels about the same  Objective Vital signs in last 24 hours: Vitals:   03/05/16 0500 03/05/16 0600 03/05/16 0700 03/05/16 0724  BP: (!) 119/38 (!) 98/59 (!) 111/34   Pulse: 65 64 65   Resp: 17 16 17    Temp:    97.5 F (36.4 C)  TempSrc:    Oral  SpO2: 100% 100% 100%   Weight:  107 kg (236 lb)    Height:       Weight change: -55.883 kg (-123 lb 3.2 oz)  Intake/Output Summary (Last 24 hours) at 03/05/16 0820 Last data filed at 03/05/16 0800  Gross per 24 hour  Intake            995.8 ml  Output             3408 ml  Net          -2412.2 ml   Physical Exam:  Blood pressure (!) 112/41, pulse (!) 48, temperature 97.4 F (36.3 C), temperature source Oral, resp. rate 17, height 5\' 4"  (1.626 m), weight (!) 162.9 kg (359 lb 3.2 oz), SpO2 98 %.  Very pleasant but MO WF VS as noted Not on O2 +JVD R IJ temp cath (11/7) Lungs grossly clear Left sided pacemaker [id number] No S3 2/6 systolic murmur USB heard up into the neck and across precordium Abdomen obese, large pannus 3+ pitting edema both LE's to knees, pitting of thighs, pitting of abd wall and up to axillary area PICC line in R arm (to be removed) Clear urine in foley    Recent Labs Lab 03/14/2016 1754 02/29/16 0520 03/01/16 0500 03/02/16 0445 03/03/16 0504 03/04/16 0312 03/05/16 0430 03/05/16 0438  NA 140 138 137 135 133* 137  --  137  K 4.6 4.5 3.5 3.2* 3.2* 3.3*  --  3.6  CL 106 107 106 102 99* 102  --  103  CO2 23 22 23 23 23 23   --  22  GLUCOSE 118* 84 95 158* 192* 102*  --  94  BUN 68* 66* 65* 65* 63* 68*  --  54*  CREATININE 2.76* 2.60* 2.57* 2.60* 2.47* 2.44*  --  2.17*  CALCIUM 9.2 9.0 8.9 8.8* 8.7* 8.9  --  8.9  PHOS  --   --   --   --   --   --  3.2  --      Recent Labs Lab 03/23/2016 1754 03/05/16 0430  AST 25  --   ALT 13*  --   ALKPHOS 66  --   BILITOT 1.4*  --   PROT  6.5  --   ALBUMIN 2.6* 2.5*     Recent Labs Lab 03/02/16 0445 03/03/16 0504 03/04/16 0312 03/05/16 0438  WBC 4.2 5.2 5.8 5.2  NEUTROABS 2.3 3.3 3.8 3.3  HGB 8.5* 8.4* 7.6* 8.2*  HCT 27.6* 27.7* 24.7* 26.9*  MCV 102.2* 102.2* 102.9* 103.5*  PLT 122* 135* 120* 144*      Recent Labs Lab 02/29/16 0813  IRON 51  TIBC 367  FERRITIN 183   Studies/Results: Dg Chest Port 1 View  Result Date: 03/04/2016 CLINICAL DATA:  Central line placement EXAM: PORTABLE CHEST 1 VIEW COMPARISON:  Portable chest x-ray of February 28, 2016 FINDINGS: The patient has undergone placement of a large caliber right internal jugular venous catheter. The tip of the catheter projects over the midportion of  the SVC. There is no postprocedure pneumothorax or hemo thorax. The lungs are reasonably well inflated. The interstitial markings remain increased diffusely. Confluent density in the right perihilar region is stable. The cardiac silhouette is enlarged. The pulmonary vascularity is indistinct. The ICD is in stable position. The right-sided PICC line tip projects over the junction of the proximal and midportions of the SVC. IMPRESSION: No postprocedure complication following right internal jugular venous catheter placement. CHF with mild pulmonary interstitial edema slightly more conspicuous than on the earlier study. Electronically Signed   By: David  Martinique M.D.   On: 03/04/2016 17:01   Medications: . heparin 10,000 units/ 20 mL infusion syringe 1,350 Units/hr (03/05/16 0810)  . milrinone 0.25 mcg/kg/min (03/05/16 0112)  . dialysis replacement fluid (prismasate) 300 mL/hr at 03/04/16 1730  . dialysis replacement fluid (prismasate) 200 mL/hr at 03/04/16 1730  . dialysate (PRISMASATE) 1,000 mL/hr at 03/05/16 0414   . allopurinol  50 mg Oral Daily  . atorvastatin  20 mg Oral q1800  . chlorhexidine  15 mL Mouth Rinse BID  . cholecalciferol  5,000 Units Oral Daily  . guaiFENesin  600 mg Oral BID  . loratadine   10 mg Oral Daily  . mouth rinse  15 mL Mouth Rinse q12n4p  . mometasone-formoterol  2 puff Inhalation BID  . multivitamin with minerals  1 tablet Oral Daily  . sodium chloride flush  10-40 mL Intracatheter Q12H  . sodium chloride flush  3 mL Intravenous Q12H  . cyanocobalamin  500 mcg Oral Daily  . vitamin C  500 mg Oral BID     Background: 65 y.o. 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 Hollie Salk at [facility] just once; baseline has been worsening over course of 2017),  super morbid obesity, OHS/OSA (CPAP), dCHF, AS, pacemaker, AF. Has been admitted at least 3X this year with massive volume overload (diuresed 27 kg during a 05/2015 admission, 5.5 liters May admission (had pacer upgrade), August diuresed to 333 lb (not sure how much weight lost).  On the current occasion she is admitted with a 30 lb weight increase since August, SOB, worsening edema. Has rec'd IV lasix 160 every 8 + metolazone 5 BID + milronone and has failed to achieve a negative fluid balance. Creatinine has been 2.4-2.7 during the admission.  We are asked to see for the possibility of CRRT. In speaking with her she is agreeable, and acknowledges that she could end up on dialysis permanently if we cannot manage her fluids with diuretics after we get her unloaded with CRRT. Catheter placed and CRRT started 11/7.   Assessment/Recommendations  1. Diuretic refractory CHF/volume overload - CRRT started late 11/7 - goal 150-200 off. Weights not helpful on current bed - RN to try to get standing weights. Continue with all 4K fluids/300/200/1000/heparin in circuit. K and phos OK so far.. 2. Chronic dCHF. EF 60-65%. Suspicion primary RV failure. ECHO this admission report states "nl RV fx" and LVEF nl. 3. Pacemaker status 4. AFib - Now just heparin with CRRT and peripheral heparin levels actually high 5. CKD4 -I note that her baseline creatinine has been worsening over the course of 2017 - now around 2.4-2.6. It  is entirely possible that she will end up dialysis dependent if, after getting her volume down with CRRT her fluids can't be managed with diuretics. PICC line to be removed (future vascular access site) 6. Anemia - h/o AVM's. Fe deficient. Dosed with feraheme 11/7. Transfuse 1  unit PRBC's (not ordered for yesterday). Start Aranesp 100/week 7. Metabolic bone - check PTH (pending), Ca and phos OK 8. OHS/OSA - CPAP at night. Has own machine. 9. Super morbid obesity. Need to keep her mobile.   Jamal Maes, MD [facility] [phone] pager 03/05/2016, 8:20 AM

## 2016-03-05 NOTE — Progress Notes (Signed)
Advanced Heart Failure Rounding Note   Subjective:    Admitted with marked volume overload. Continues with sluggish diuresis despite addition of milrinone and switch to lasix gtt.   Renal consulted 03/04/16 with poor diuresis.  Pt placed on CRRT.   Coox 69.4  Feeling slightly sore after procedure yesterday for temp dialysis cath.  Nervous about the possibility of permanent HD.    On CRRT. Pulling 150 mL/hr.   Echo: EF 60-65%, severe LVH, moderate AS with mean gradient 32 mmHg, RV not well visualized.   Objective:   Weight Range:  Vital Signs:   Temp:  [97.2 F (36.2 C)-98.2 F (36.8 C)] 97.5 F (36.4 C) (11/08 0724) Pulse Rate:  [48-68] 65 (11/08 0700) Resp:  [11-20] 17 (11/08 0700) BP: (92-120)/(35-59) 98/59 (11/08 0600) SpO2:  [92 %-100 %] 100 % (11/08 0700) Weight:  [236 lb (107 kg)] 236 lb (107 kg) (11/08 0600) Last BM Date: 03/04/16  Weight change: Filed Weights   03/03/16 0500 03/04/16 0500 03/05/16 0600  Weight: (!) 359 lb 8 oz (163.1 kg) (!) 359 lb 3.2 oz (162.9 kg) 236 lb (107 kg)    Intake/Output:   Intake/Output Summary (Last 24 hours) at 03/05/16 0747 Last data filed at 03/05/16 0700  Gross per 24 hour  Intake            835.8 ml  Output             3208 ml  Net          -2372.2 ml     Physical Exam: General:  Chronically ill appearing. Sitting up in bed. CRRT ongoing.  HEENT: Normal Neck: supple. JVP up to ear.  Carotids 2+ bilat; no bruits. No thyromegaly or nodule noted.   Cor: PMI nondisplaced. Regular. 3/6 systolic crescendo-decrescendo murmur RUSB.   Lungs: Mildly diminished basilar sounds.   Abdomen: obese, soft, NT, ND, no HSM. No bruits or masses. +BS  Extremities: no cyanosis, clubbing, rash, BLE 2-3+ edema into thighs. RUE PICC. RIJ Temp dialysis cath.  Neuro: alert & orientedx3, cranial nerves grossly intact. moves all 4 extremities w/o difficulty. Affect pleasant  Telemetry: Reviewed, V pacing in 60s. Underlying atrial fib.       Labs: Basic Metabolic Panel:  Recent Labs Lab 03/08/2016 1754  03/01/16 0500 03/02/16 0445 03/03/16 0504 03/04/16 0312 03/05/16 0430 03/05/16 0438  NA 140  < > 137 135 133* 137  --  137  K 4.6  < > 3.5 3.2* 3.2* 3.3*  --  3.6  CL 106  < > 106 102 99* 102  --  103  CO2 23  < > 23 23 23 23   --  22  GLUCOSE 118*  < > 95 158* 192* 102*  --  94  BUN 68*  < > 65* 65* 63* 68*  --  54*  CREATININE 2.76*  < > 2.57* 2.60* 2.47* 2.44*  --  2.17*  CALCIUM 9.2  < > 8.9 8.8* 8.7* 8.9  --  8.9  MG 2.2  --   --   --   --  2.2  --  2.3  PHOS  --   --   --   --   --   --  3.2  --   < > = values in this interval not displayed.  Liver Function Tests:  Recent Labs Lab 03/05/2016 1754 03/05/16 0430  AST 25  --   ALT 13*  --   ALKPHOS 66  --  BILITOT 1.4*  --   PROT 6.5  --   ALBUMIN 2.6* 2.5*   No results for input(s): LIPASE, AMYLASE in the last 168 hours. No results for input(s): AMMONIA in the last 168 hours.  CBC:  Recent Labs Lab 03/01/16 0500 03/02/16 0445 03/03/16 0504 03/04/16 0312 03/05/16 0438  WBC 3.1* 4.2 5.2 5.8 5.2  NEUTROABS 1.7 2.3 3.3 3.8 3.3  HGB 9.6* 8.5* 8.4* 7.6* 8.2*  HCT 31.5* 27.6* 27.7* 24.7* 26.9*  MCV 102.3* 102.2* 102.2* 102.9* 103.5*  PLT 105* 122* 135* 120* 144*    Cardiac Enzymes: No results for input(s): CKTOTAL, CKMB, CKMBINDEX, TROPONINI in the last 168 hours.  BNP: BNP (last 3 results)  Recent Labs  07/09/15 1158 07/19/15 1124 03/04/2016 1754  BNP 133.0* 149.0* 429.4*    ProBNP (last 3 results) No results for input(s): PROBNP in the last 8760 hours.    Other results:  Imaging: Dg Chest Port 1 View  Result Date: 03/04/2016 CLINICAL DATA:  Central line placement EXAM: PORTABLE CHEST 1 VIEW COMPARISON:  Portable chest x-ray of February 28, 2016 FINDINGS: The patient has undergone placement of a large caliber right internal jugular venous catheter. The tip of the catheter projects over the midportion of the SVC. There is no  postprocedure pneumothorax or hemo thorax. The lungs are reasonably well inflated. The interstitial markings remain increased diffusely. Confluent density in the right perihilar region is stable. The cardiac silhouette is enlarged. The pulmonary vascularity is indistinct. The ICD is in stable position. The right-sided PICC line tip projects over the junction of the proximal and midportions of the SVC. IMPRESSION: No postprocedure complication following right internal jugular venous catheter placement. CHF with mild pulmonary interstitial edema slightly more conspicuous than on the earlier study. Electronically Signed   By: David  Martinique M.D.   On: 03/04/2016 17:01     Medications:     Scheduled Medications: . allopurinol  50 mg Oral Daily  . atorvastatin  20 mg Oral q1800  . chlorhexidine  15 mL Mouth Rinse BID  . cholecalciferol  5,000 Units Oral Daily  . guaiFENesin  600 mg Oral BID  . loratadine  10 mg Oral Daily  . mouth rinse  15 mL Mouth Rinse q12n4p  . mometasone-formoterol  2 puff Inhalation BID  . multivitamin with minerals  1 tablet Oral Daily  . sodium chloride flush  10-40 mL Intracatheter Q12H  . sodium chloride flush  3 mL Intravenous Q12H  . cyanocobalamin  500 mcg Oral Daily  . vitamin C  500 mg Oral BID    Infusions: . heparin 10,000 units/ 20 mL infusion syringe 1,300 Units/hr (03/05/16 0704)  . milrinone 0.25 mcg/kg/min (03/05/16 0112)  . dialysis replacement fluid (prismasate) 300 mL/hr at 03/04/16 1730  . dialysis replacement fluid (prismasate) 200 mL/hr at 03/04/16 1730  . dialysate (PRISMASATE) 1,000 mL/hr at 03/05/16 0414    PRN Medications: sodium chloride, acetaminophen, albuterol, guaiFENesin-dextromethorphan, heparin, heparin, heparin, ipratropium-albuterol, ondansetron (ZOFRAN) IV, sodium chloride flush, sodium chloride flush   Assessment/Plan/Discussion    Robyn Porter is a 65 y.o. female with history with chronic systolic CHF, Anemia, OHS/OSA,  symptomatic bradycardia s/p MDT PPm with CRT-P upgrade given high percentage of RV pacing, CKD stage IV.  Admitted with marked volume overload.  1. Chronic diastolic CHF: EF 123456 on echo this admission, RV poorly visualized. Recent upgrade to Medtronic CRT given constant RV pacing. Volume status markedly elevated. Weight up 30 lbs since August.  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  Renal function stable.  Milrinone begun for RV support, co-ox 69%.  Had to start CVVH for volume removal given diuretic failure.  - Continue milrinone 0.25 mcg for RV support. BP stable.   - Now on CVVH pulling 150 mL/hr.  Looking at her clinical trajectory over the last year, she may end up needing HD more permanently to keep volume off. - Off BB with low coox on admission.  2. Symptomatic bradycardia: MDT PPM. Upgraded to CRT given high percentage RV pacing.  3. Acute on CKD Stage IV: Creatinine baseline 2 -2.3 - Now on CVVHD. Renal following.    4. Chronic atrial fibrillation:  - Hold Toprol XL for now with low output. - On heparin gtt while getting CVVH.   5. OHS/OSA:Continue nightly CPAP.  6. 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 Given 1UPRBC on admit.  - States she has occasional hemorrhoidal bleed - Has been followed by Dr. Lindi Adie who is working up anemia - Hgb 8.4 -> 7.6 -> 8.2. Continue to follow.  7. Chronic venous stasis:  - Bilateral venous stasis changes, stable. No cellulitis noted.  8. Morbid obesity:  - PT following for deconditioning.  Recommending HHPT TENTATIVELY.  If pt remains at current level of deconditioning, recommending SNF.    9. Hypokalemia - Continue to Supp K.  She has poor absorption.  10. Aortic stenosis: Moderate by echo 03/02/16.    Continue CVVHD. Started yesterday evening.   Length of Stay: [address]  Annamaria Helling 03/05/2016, 7:47 AM  Advanced Heart Failure Team Pager [phone] (M-F; 7a - 4p)  Please contact [facility] for night-coverage after hours (4p -7a ) and weekends on [email or web address]  Patient seen with PA, agree with the above note.  Given diuretic resistance, she started CVVH yesterday.  Pulling fluid at 150 cc/hr, tolerating so far.  CVP > 20, continue CVVH.  Question will be long term => she may end up needing HD to keep fluid off.   [name] 03/05/2016 8:02 AM

## 2016-03-06 ENCOUNTER — Telehealth: Payer: Self-pay | Admitting: Cardiology

## 2016-03-06 LAB — RENAL FUNCTION PANEL
ALBUMIN: 2.4 g/dL — AB (ref 3.5–5.0)
ANION GAP: 10 (ref 5–15)
ANION GAP: 8 (ref 5–15)
Albumin: 2.3 g/dL — ABNORMAL LOW (ref 3.5–5.0)
BUN: 37 mg/dL — AB (ref 6–20)
BUN: 32 mg/dL — ABNORMAL HIGH (ref 6–20)
CALCIUM: 8.6 mg/dL — AB (ref 8.9–10.3)
CHLORIDE: 102 mmol/L (ref 101–111)
CO2: 24 mmol/L (ref 22–32)
CO2: 25 mmol/L (ref 22–32)
CREATININE: 1.81 mg/dL — AB (ref 0.44–1.00)
Calcium: 8.6 mg/dL — ABNORMAL LOW (ref 8.9–10.3)
Chloride: 102 mmol/L (ref 101–111)
Creatinine, Ser: 1.72 mg/dL — ABNORMAL HIGH (ref 0.44–1.00)
GFR calc Af Amer: 35 mL/min — ABNORMAL LOW (ref >60)
GFR calc non Af Amer: 30 mL/min — ABNORMAL LOW (ref >60)
GFR calc non Af Amer: 28 mL/min — ABNORMAL LOW (ref >60)
GFR, EST AFRICAN AMERICAN: 32 mL/min — AB (ref >60)
GLUCOSE: 108 mg/dL — AB (ref 65–99)
GLUCOSE: 121 mg/dL — AB (ref 65–99)
PHOSPHORUS: 2 mg/dL — AB (ref 2.5–4.6)
POTASSIUM: 3.6 mmol/L (ref 3.5–5.1)
Phosphorus: 2.5 mg/dL (ref 2.5–4.6)
Potassium: 3.7 mmol/L (ref 3.5–5.1)
SODIUM: 135 mmol/L (ref 135–145)
Sodium: 136 mmol/L (ref 135–145)

## 2016-03-06 LAB — POCT ACTIVATED CLOTTING TIME
ACTIVATED CLOTTING TIME: 0 s
ACTIVATED CLOTTING TIME: 186 s
ACTIVATED CLOTTING TIME: 186 s
ACTIVATED CLOTTING TIME: 191 s
ACTIVATED CLOTTING TIME: 191 s
ACTIVATED CLOTTING TIME: 202 s
Activated Clotting Time: 180 seconds
Activated Clotting Time: 186 seconds
Activated Clotting Time: 191 seconds
Activated Clotting Time: 197 seconds
Activated Clotting Time: 208 seconds
Activated Clotting Time: 213 seconds

## 2016-03-06 LAB — CBC WITH DIFFERENTIAL/PLATELET
BASOS ABS: 0 10*3/uL (ref 0.0–0.1)
BASOS PCT: 1 %
EOS ABS: 0.3 10*3/uL (ref 0.0–0.7)
EOS PCT: 5 %
HEMATOCRIT: 27.5 % — AB (ref 36.0–46.0)
Hemoglobin: 8.4 g/dL — ABNORMAL LOW (ref 12.0–15.0)
Lymphocytes Relative: 13 %
Lymphs Abs: 0.8 10*3/uL (ref 0.7–4.0)
MCH: 30.8 pg (ref 26.0–34.0)
MCHC: 30.5 g/dL (ref 30.0–36.0)
MCV: 100.7 fL — ABNORMAL HIGH (ref 78.0–100.0)
MONO ABS: 0.7 10*3/uL (ref 0.1–1.0)
Monocytes Relative: 11 %
NEUTROS ABS: 4.4 10*3/uL (ref 1.7–7.7)
Neutrophils Relative %: 70 %
PLATELETS: 127 10*3/uL — AB (ref 150–400)
RBC: 2.73 MIL/uL — ABNORMAL LOW (ref 3.87–5.11)
RDW: 20.9 % — AB (ref 11.5–15.5)
WBC: 6.2 10*3/uL (ref 4.0–10.5)

## 2016-03-06 LAB — TYPE AND SCREEN
ABO/RH(D): B POS
Antibody Screen: POSITIVE
DAT, IgG: POSITIVE
UNIT DIVISION: 0

## 2016-03-06 LAB — COOXEMETRY PANEL
CARBOXYHEMOGLOBIN: 3.1 % — AB (ref 0.5–1.5)
Methemoglobin: 0.7 % (ref 0.0–1.5)
O2 SAT: 83.1 %
TOTAL HEMOGLOBIN: 8.7 g/dL — AB (ref 12.0–16.0)

## 2016-03-06 LAB — APTT: aPTT: >200 seconds (ref 24–36)

## 2016-03-06 LAB — MAGNESIUM: Magnesium: 2.3 mg/dL (ref 1.7–2.4)

## 2016-03-06 NOTE — Progress Notes (Signed)
Patient has home CPAP that she can place self on and off. Pt resting comfortably at this time.

## 2016-03-06 NOTE — Progress Notes (Signed)
Patient's bp soft. Dr. Joelyn Oms with nephrology paged about possibly starting pressor to support bp. Received order to slow the fluid removal rate and give some time for bp to increase. Will continue with current plan of care.

## 2016-03-06 NOTE — Progress Notes (Signed)
Advanced Heart Failure Rounding Note   Subjective:    Admitted with marked volume overload. Continues with sluggish diuresis despite addition of milrinone and switch to lasix gtt.   Renal consulted 03/04/16 with poor diuresis.  Pt placed on CRRT.   Coox 83.1  Remains on CRRT this am. Now pulling 200-261ml/hr. Pressures stable.   Feeling somewhat better. Still with cough.   Echo: EF 60-65%, severe LVH, moderate AS with mean gradient 32 mmHg, RV not well visualized.   Objective:   Weight Range:  Vital Signs:   Temp:  [97.2 F (36.2 C)-97.9 F (36.6 C)] 97.8 F (36.6 C) (11/09 0755) Pulse Rate:  [60-94] 61 (11/09 0700) Resp:  [12-22] 19 (11/09 0700) BP: (70-125)/(33-92) 125/46 (11/09 0700) SpO2:  [93 %-100 %] 98 % (11/09 0700) Weight:  [40 lb (18.1 kg)] 40 lb (18.1 kg) (11/09 0400) Last BM Date: 03/06/16  Weight change: Filed Weights   03/04/16 0500 03/05/16 0600 03/06/16 0400  Weight: (!) 359 lb 3.2 oz (162.9 kg) 236 lb (107 kg) 40 lb (18.1 kg)    Intake/Output:   Intake/Output Summary (Last 24 hours) at 03/06/16 0801 Last data filed at 03/06/16 0700  Gross per 24 hour  Intake           1262.6 ml  Output             4688 ml  Net          -3425.4 ml     Physical Exam: General:  Chronically ill appearing. Sitting up in bed. CRRT ongoing.  HEENT: Normal Neck: supple. JVP to ear. Carotids 2+ bilat; no bruits. No thyromegaly or nodule noted.   Cor: PMI nondisplaced. Regular. 3/6 systolic crescendo-decrescendo murmur RUSB.   Lungs: Diminished basilar sounds. Distant Abdomen: obese, soft, NT, ND, no HSM. No bruits or masses. +BS  Extremities: no cyanosis, clubbing, rash, BLE 2+ edema into thighs. RUE PICC. RIJ Temp dialysis cath.  Neuro: alert & orientedx3, cranial nerves grossly intact. moves all 4 extremities w/o difficulty. Affect pleasant  Telemetry: Reviewed, V-pacing in 74s. Afib underlying.       Labs: Basic Metabolic Panel:  Recent Labs Lab  03/01/2016 1754  03/03/16 0504 03/04/16 0312 03/05/16 0430 03/05/16 0438 03/05/16 1845 03/06/16 0354  NA 140  < > 133* 137  --  137 133* 136  K 4.6  < > 3.2* 3.3*  --  3.6 3.6 3.6  CL 106  < > 99* 102  --  103 102 102  CO2 23  < > 23 23  --  22 23 24   GLUCOSE 118*  < > 192* 102*  --  94 164* 108*  BUN 68*  < > 63* 68*  --  54* 43* 37*  CREATININE 2.76*  < > 2.47* 2.44*  --  2.17* 1.88* 1.72*  CALCIUM 9.2  < > 8.7* 8.9  --  8.9 8.4* 8.6*  MG 2.2  --   --  2.2  --  2.3  --  2.3  PHOS  --   --   --   --  3.2  --  2.6 2.5  < > = values in this interval not displayed.  Liver Function Tests:  Recent Labs Lab 03/04/2016 1754 03/05/16 0430 03/05/16 1845 03/06/16 0354  AST 25  --   --   --   ALT 13*  --   --   --   ALKPHOS 66  --   --   --  BILITOT 1.4*  --   --   --   PROT 6.5  --   --   --   ALBUMIN 2.6* 2.5* 2.3* 2.3*   No results for input(s): LIPASE, AMYLASE in the last 168 hours. No results for input(s): AMMONIA in the last 168 hours.  CBC:  Recent Labs Lab 03/02/16 0445 03/03/16 0504 03/04/16 0312 03/05/16 0438 03/06/16 0354  WBC 4.2 5.2 5.8 5.2 6.2  NEUTROABS 2.3 3.3 3.8 3.3 4.4  HGB 8.5* 8.4* 7.6* 8.2* 8.4*  HCT 27.6* 27.7* 24.7* 26.9* 27.5*  MCV 102.2* 102.2* 102.9* 103.5* 100.7*  PLT 122* 135* 120* 144* 127*    Cardiac Enzymes: No results for input(s): CKTOTAL, CKMB, CKMBINDEX, TROPONINI in the last 168 hours.  BNP: BNP (last 3 results)  Recent Labs  07/09/15 1158 07/19/15 1124 03/25/2016 1754  BNP 133.0* 149.0* 429.4*    ProBNP (last 3 results) No results for input(s): PROBNP in the last 8760 hours.    Other results:  Imaging: Dg Chest Port 1 View  Result Date: 03/04/2016 CLINICAL DATA:  Central line placement EXAM: PORTABLE CHEST 1 VIEW COMPARISON:  Portable chest x-ray of February 28, 2016 FINDINGS: The patient has undergone placement of a large caliber right internal jugular venous catheter. The tip of the catheter projects over the  midportion of the SVC. There is no postprocedure pneumothorax or hemo thorax. The lungs are reasonably well inflated. The interstitial markings remain increased diffusely. Confluent density in the right perihilar region is stable. The cardiac silhouette is enlarged. The pulmonary vascularity is indistinct. The ICD is in stable position. The right-sided PICC line tip projects over the junction of the proximal and midportions of the SVC. IMPRESSION: No postprocedure complication following right internal jugular venous catheter placement. CHF with mild pulmonary interstitial edema slightly more conspicuous than on the earlier study. Electronically Signed   By: David  Martinique M.D.   On: 03/04/2016 17:01     Medications:     Scheduled Medications: . allopurinol  50 mg Oral Daily  . atorvastatin  20 mg Oral q1800  . chlorhexidine  15 mL Mouth Rinse BID  . cholecalciferol  5,000 Units Oral Daily  . darbepoetin (ARANESP) injection - NON-DIALYSIS  100 mcg Subcutaneous Q Wed-1800  . guaiFENesin  600 mg Oral BID  . loratadine  10 mg Oral Daily  . mouth rinse  15 mL Mouth Rinse q12n4p  . mometasone-formoterol  2 puff Inhalation BID  . multivitamin with minerals  1 tablet Oral Daily  . sodium chloride flush  10-40 mL Intracatheter Q12H  . sodium chloride flush  3 mL Intravenous Q12H  . cyanocobalamin  500 mcg Oral Daily  . vitamin C  500 mg Oral BID    Infusions: . heparin 10,000 units/ 20 mL infusion syringe 1,400 Units/hr (03/06/16 0753)  . milrinone 0.25 mcg/kg/min (03/06/16 0259)  . dialysis replacement fluid (prismasate) 300 mL/hr at 03/06/16 0331  . dialysis replacement fluid (prismasate) 200 mL/hr at 03/05/16 1903  . dialysate (PRISMASATE) 1,000 mL/hr at 03/06/16 0520    PRN Medications: sodium chloride, acetaminophen, albuterol, benzonatate, guaiFENesin-dextromethorphan, heparin, heparin, heparin, ipratropium-albuterol, ondansetron (ZOFRAN) IV, sodium chloride flush, sodium chloride  flush   Assessment/Plan/Discussion    Robyn Porter is a 65 y.o. female with history with chronic systolic CHF, Anemia, OHS/OSA, symptomatic bradycardia s/p MDT PPm with CRT-P upgrade given high percentage of RV pacing, CKD stage IV.  Admitted with marked volume overload.  1. Chronic diastolic CHF: EF 123456 on echo  this admission, RV poorly visualized. Recent upgrade to Medtronic CRT given constant RV pacing. Volume status markedly elevated. Weight up 30 lbs since August.  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  Renal function stable.  Milrinone begun for RV support, co-ox 69%.  Had to start CVVH for volume removal given diuretic failure.  - Continue milrinone 0.25 mcg for RV support. BP stable.   - Continue CVVH. Pulling 200-250 ml/hr.   Per Renal. ? Need for IHD.  - Off BB with low coox on admission.  2. Symptomatic bradycardia: MDT PPM. Upgraded to CRT given high percentage RV pacing.  3. Acute on CKD Stage IV: Creatinine baseline 2 -2.3 - Now on CVVHD. Renal following.    4. Chronic atrial fibrillation:  - Hold Toprol XL for now with low output. - On heparin gtt while getting CVVH.   5. OHS/OSA:Continue nightly CPAP.  6. 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 Given 1UPRBC on admit.  - States she has occasional hemorrhoidal bleed - Has been followed by Dr. Lindi Adie who is working up anemia - Hgb 8.4 -> 7.6 -> 8.2 -> 8.4. Continue to follow.  7. Chronic venous stasis:  - Chronic changes, but stable. No cellulitis appreciated.   8. Morbid obesity:  - PT following. Recommending HHPT vs SNF.   9. Hypokalemia - Resolved. Also on CVVHD.  10. Aortic stenosis: Moderate by echo 03/02/16.    Length of Stay: [place], [place] 03/06/2016, 8:01 AM  Advanced Heart Failure Team Pager [phone] (M-F; 7a - 4p)  Please contact [facility] for night-coverage after hours (4p -7a ) and weekends on [email or web address]  Patient seen with PA,  agree with the above note.  PICC now out.  JVP elevated, still volume overloaded.  She is tolerating CVVH well, increasing UF rate today.  Continue fluid removal, likely at least a couple more days.  After that, will have to see if she will be able to remain off HD or if she will need long-term.   [name] 03/06/2016 1:35 PM

## 2016-03-06 NOTE — Progress Notes (Signed)
CRITICAL VALUE ALERT  Critical value received:  PTT > 200  Date of notification:  03/06/2016  Time of notification:  04:57  Critical value read back:Yes.    Nurse who received alert:  BShepherd, RN  MD notified (1st page):  McLean  Time of first page:  05:02  MD notified (2nd page): Augustin Coupe [facility] txt page unavailable)  Time of second page:  05:07  MD notified (3rd page): Lorrene Reid @ 05:12  MD notified (4th page): Augustin Coupe @ 05:25 (though hospital operator)  Responding MD:  Augustin Coupe @ 05:33; Aundra Dubin @ 05:40  Time MD responded:  05:33 and 05:40  MD responseAugustin Coupe- "Call back after 7; there should be a protocol; please call back after 7. Drop heparin to 2.6 cc/hr; just call back after 7." McLean- Stop heparin gtt (heparin gtt not infusing); Call renal for CRRT issues.

## 2016-03-06 NOTE — Telephone Encounter (Signed)
Dr Camnitz made aware. 

## 2016-03-06 NOTE — Telephone Encounter (Signed)
New Message  Pt call to inform Dr. Curt Bears she has been hospitalized. Please call back to discuss if needed.

## 2016-03-06 NOTE — Progress Notes (Signed)
[facility] Rounding Note  Subjective/Interval History:   CRRT started about 5PM 11/7 No accurate weights at all but is tolerating 150-200 off/hour No filter issues Filter life 36 hours Has "AM cough" UOP (as expected) dropping off)  Objective Vital signs in last 24 hours: Vitals:   03/06/16 0530 03/06/16 0600 03/06/16 0630 03/06/16 0700  BP: (!) 111/37 (!) 114/47 (!) 110/46 (!) 125/46  Pulse: 64 64 64 61  Resp: 14 12 12 19   Temp:      TempSrc:      SpO2: 98% 97% 98% 98%  Weight:      Height:       Weight change: -88.905 kg (-196 lb)  Intake/Output Summary (Last 24 hours) at 03/06/16 0721 Last data filed at 03/06/16 0700  Gross per 24 hour  Intake           1436.3 ml  Output             4888 ml  Net          -3451.7 ml   Physical Exam:  Blood pressure (!) 112/41, pulse (!) 48, temperature 97.4 F (36.3 C), temperature source Oral, resp. rate 17, height 5\' 4"  (1.626 m), weight (!) 162.9 kg (359 lb 3.2 oz), SpO2 98 %.  [name] VS as noted Not on O2 R IJ temp cath (11/7) Lungs grossly clear though coarser BS on the left posteriorly Left sided pacemaker [id number] No S3 2/6 systolic murmur USB heard up into the neck and across precordium unchamged Abdomen obese, large pannus 3+ pitting edema both LE's to knees, pitting of thighs, pitting of abd wall and up to axillary area - unchanged PICC line in R arm (to be removed - have asked again) Darker and almost brown looking urine in foley    Recent Labs Lab 03/01/16 0500 03/02/16 0445 03/03/16 0504 03/04/16 0312 03/05/16 0430 03/05/16 0438 03/05/16 1845 03/06/16 0354  NA 137 135 133* 137  --  137 133* 136  K 3.5 3.2* 3.2* 3.3*  --  3.6 3.6 3.6  CL 106 102 99* 102  --  103 102 102  CO2 23 23 23 23   --  22 23 24   GLUCOSE 95 158* 192* 102*  --  94 164* 108*  BUN 65* 65* 63* 68*  --  54* 43* 37*  CREATININE 2.57* 2.60* 2.47* 2.44*  --  2.17* 1.88* 1.72*  CALCIUM 8.9 8.8* 8.7* 8.9  --  8.9 8.4* 8.6*  PHOS  --   --    --   --  3.2  --  2.6 2.5     Recent Labs Lab 03/10/2016 1754 03/05/16 0430 03/05/16 1845 03/06/16 0354  AST 25  --   --   --   ALT 13*  --   --   --   ALKPHOS 66  --   --   --   BILITOT 1.4*  --   --   --   PROT 6.5  --   --   --   ALBUMIN 2.6* 2.5* 2.3* 2.3*     Recent Labs Lab 03/03/16 0504 03/04/16 0312 03/05/16 0438 03/06/16 0354  WBC 5.2 5.8 5.2 6.2  NEUTROABS 3.3 3.8 3.3 4.4  HGB 8.4* 7.6* 8.2* 8.4*  HCT 27.7* 24.7* 26.9* 27.5*  MCV 102.2* 102.9* 103.5* 100.7*  PLT 135* 120* 144* 127*   Results for TANZY, STUMBAUGH (MRN [id number]) as of 03/06/2016 07:28  Ref. Range 03/04/2016 14:10  PTH Latest  Ref Range: 15 - 65 pg/mL 50      Recent Labs Lab 02/29/16 0813  IRON 51  TIBC 367  FERRITIN 183   Studies/Results: Dg Chest Port 1 View  Result Date: 03/04/2016 CLINICAL DATA:  Central line placement EXAM: PORTABLE CHEST 1 VIEW COMPARISON:  Portable chest x-ray of February 28, 2016 FINDINGS: The patient has undergone placement of a large caliber right internal jugular venous catheter. The tip of the catheter projects over the midportion of the SVC. There is no postprocedure pneumothorax or hemo thorax. The lungs are reasonably well inflated. The interstitial markings remain increased diffusely. Confluent density in the right perihilar region is stable. The cardiac silhouette is enlarged. The pulmonary vascularity is indistinct. The ICD is in stable position. The right-sided PICC line tip projects over the junction of the proximal and midportions of the SVC. IMPRESSION: No postprocedure complication following right internal jugular venous catheter placement. CHF with mild pulmonary interstitial edema slightly more conspicuous than on the earlier study. Electronically Signed   By: David  Martinique M.D.   On: 03/04/2016 17:01   Medications: . heparin 10,000 units/ 20 mL infusion syringe 1,400 Units/hr (03/06/16 0052)  . milrinone 0.25 mcg/kg/min (03/06/16 0259)  . dialysis  replacement fluid (prismasate) 300 mL/hr at 03/06/16 0331  . dialysis replacement fluid (prismasate) 200 mL/hr at 03/05/16 1903  . dialysate (PRISMASATE) 1,000 mL/hr at 03/06/16 0520   . allopurinol  50 mg Oral Daily  . atorvastatin  20 mg Oral q1800  . chlorhexidine  15 mL Mouth Rinse BID  . cholecalciferol  5,000 Units Oral Daily  . darbepoetin (ARANESP) injection - NON-DIALYSIS  100 mcg Subcutaneous Q Wed-1800  . guaiFENesin  600 mg Oral BID  . loratadine  10 mg Oral Daily  . mouth rinse  15 mL Mouth Rinse q12n4p  . mometasone-formoterol  2 puff Inhalation BID  . multivitamin with minerals  1 tablet Oral Daily  . sodium chloride flush  10-40 mL Intracatheter Q12H  . sodium chloride flush  3 mL Intravenous Q12H  . cyanocobalamin  500 mcg Oral Daily  . vitamin C  500 mg Oral BID     Background: 65 y.o. 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 Hollie Salk at [facility] just once; baseline has been worsening over course of 2017),  super morbid obesity, OHS/OSA (CPAP), dCHF, AS, pacemaker, AF. Has been admitted at least 3X this year with massive volume overload (diuresed 27 kg during a 05/2015 admission, 5.5 liters May admission (had pacer upgrade), August diuresed to 333 lb (not sure how much weight lost).  On the current occasion she is admitted with a 30 lb weight increase since August, SOB, worsening edema. Has rec'd IV lasix 160 every 8 + metolazone 5 BID + milronone and has failed to achieve a negative fluid balance. Creatinine has been 2.4-2.7 during the admission.  We are asked to see for the possibility of CRRT. In speaking with her she is agreeable, and acknowledges that she could end up on dialysis permanently if we cannot manage her fluids with diuretics after we get her unloaded with CRRT. Catheter placed and CRRT started 11/7.   Assessment/Recommendations  1. Diuretic refractory CHF/volume overload - CRRT started late 11/7 - goal 150-200 off. Weights not  helpful on current bed Tolerating 150-200 off so will increase UFR 200-250 as tolerated. Continue with all 4K fluids/300/200/1000/heparin in circuit. K and phos OK so far. 2. Chronic dCHF. EF 60-65%. Suspicion primary  RV failure. ECHO this admission report states "nl RV fx" and LVEF nl. On milrinone. 3. Pacemaker status 4. AFib - Now just heparin with CRRT and peripheral heparin levels high. No bleeding noted 5. CKD4 -Her baseline creatinine has been worsening over the course of 2017 - most recently around 2.4-2.6. It is entirely possible (in fact very likely) that she will end up dialysis dependent if, after getting her volume down with CRRT, her fluids can't be managed with diuretics. PICC line to be removed (future vascular access site) 6. Anemia - h/o AVM's. Fe deficient. Dosed with feraheme 11/7. Transfused 1 unit PRBC's 11/8, 2 u 11/3. Started Aranesp 100/week 1st dosed [id number]. 7. Metabolic bone - PTH 50, no intervention needed. Ca and phos OK 8. OHS/OSA - CPAP at night. Has own machine. 9. Super morbid obesity. Need to keep her mobile. 10. Please remove PICC line...   Jamal Maes, MD [facility] [phone] pager 03/06/2016, 7:21 AM

## 2016-03-06 NOTE — Progress Notes (Signed)
PICC line removed per Dr. Lorrene Reid.

## 2016-03-06 NOTE — Progress Notes (Signed)
Physical Therapy Treatment Patient Details Name: Robyn Porter MRN: [id number] DOB: 01-17-1951 Today's Date: 03/06/2016    History of Present Illness Pt adm with significant volume overload. Attempts to diurese not working well and pt started on CVVHD on 11/7. PMH - chronic systolic heart failure, htn, dm, pacer, copd, ckd    PT Comments    Pt mobilizing slightly better than prior to initiation of CVVHD. Pt remains motivated to be more independent.  Follow Up Recommendations  SNF     Equipment Recommendations  None recommended by PT    Recommendations for Other Services       Precautions / Restrictions Precautions Precautions: Fall Restrictions Weight Bearing Restrictions: No    Mobility  Bed Mobility Overal bed mobility: Needs Assistance Bed Mobility: Sidelying to Sit;Rolling Rolling: Min guard Sidelying to sit: +2 for physical assistance;Min assist;HOB elevated       General bed mobility comments: Assist to elevate trunk into sitting. Pt with use of rail.  Transfers Overall transfer level: Needs assistance Equipment used: Rolling walker (2 wheeled) Transfers: Sit to/from [employer] Sit to Stand: +2 physical assistance;Mod assist (Nurse present to manage lines) Stand pivot transfers: +2 safety/equipment;Min assist (Nurse for lines)       General transfer comment: Assist to power up. Stood twice with standing scale and then stood with walker. Pivot to chair with small pivotal steps with assist for balance and support and nurse managing line.  Ambulation/Gait             General Gait Details: Not attempted due to CVVHD   Stairs            Wheelchair Mobility    Modified Rankin (Stroke Patients Only)       Balance Overall balance assessment: Needs assistance Sitting-balance support: No upper extremity supported Sitting balance-Leahy Scale: Fair     Standing balance support: Bilateral upper extremity supported Standing  balance-Leahy Scale: Poor Standing balance comment: Walker and +2 min A for static standing. Stood x 3. Pt with forward flexed posture.                    Cognition Arousal/Alertness: Awake/alert Behavior During Therapy: WFL for tasks assessed/performed Overall Cognitive Status: Within Functional Limits for tasks assessed                      Exercises      General Comments        Pertinent Vitals/Pain Pain Assessment: No/denies pain    Home Living                      Prior Function            PT Goals (current goals can now be found in the care plan section) Acute Rehab PT Goals Patient Stated Goal: return home Progress towards PT goals: Progressing toward goals    Frequency    Min 3X/week      PT Plan Current plan remains appropriate    Co-evaluation             End of Session   Activity Tolerance: Patient tolerated treatment well Patient left: in chair;with call bell/phone within reach     Time: 1335-1416 PT Time Calculation (min) (ACUTE ONLY): 41 min  Charges:  $Gait Training: 8-22 mins                    G Codes:  Aziel Morgan 03/06/2016, 2:33 PM [facility] PT [phone]

## 2016-03-06 NOTE — Progress Notes (Signed)
CVP line removed with PICC line removed.

## 2016-03-06 NOTE — Progress Notes (Signed)
OT Treatment note    03/06/16 1423  OT Visit Information  Last OT Received On 03/06/16  Assistance Needed +3 or more (Due to management of CVVHD)  PT/OT/SLP Co-Evaluation/Treatment Yes  Reason for Co-Treatment Complexity of the patient's impairments (multi-system involvement);For patient/therapist safety  OT goals addressed during session ADL's and self-care;Strengthening/ROM  History of Present Illness Pt adm with significant volume overload. Attempts to diurese not working well and pt started on CVVHD on 11/7. PMH - chronic systolic heart failure, htn, dm, pacer, copd, ckd  Precautions  Precautions Fall  Pain Assessment  Pain Assessment No/denies pain  Cognition  Arousal/Alertness Awake/alert  Behavior During Therapy WFL for tasks assessed/performed  Overall Cognitive Status Within Functional Limits for tasks assessed  ADL  Overall ADL's  Needs assistance/impaired  Grooming Set up  Toileting- Clothing Manipulation and Hygiene Total assistance  Functional mobility during ADLs Moderate assistance;+2 for physical assistance  Bed Mobility  Overal bed mobility Needs Assistance  Bed Mobility Sidelying to Sit;Rolling  Rolling Min guard  Sidelying to sit +2 for physical assistance;Min assist;HOB elevated  General bed mobility comments Assist to elevate trunk into sitting. Pt with use of rail.  Balance  Overall balance assessment Needs assistance  Sitting-balance support No upper extremity supported  Sitting balance-Leahy Scale Fair  Standing balance support Bilateral upper extremity supported  Standing balance-Leahy Scale Poor  Standing balance comment Walker and +2 min A for static standing. Stood x 3. Pt with forward flexed posture.  Restrictions  Weight Bearing Restrictions No  Transfers  Overall transfer level Needs assistance  Equipment used Rolling walker (2 wheeled)  Transfers Sit to/from [employer] Transfers  Sit to Stand +2 physical assistance;Mod assist (Nurse  present to manage lines)  Stand pivot transfers +2 safety/equipment;Min assist (Nurse for lines)  General transfer comment Assist to power up. Stood twice with standing scale and then stood with walker. Pivot to chair with small pivotal steps with assist for balance and support and nurse managing line.  Exercises  Exercises Other exercises  Other Exercises  Other Exercises BUE therabanc genernal strengthening  OT - End of Session  Equipment Utilized During Treatment Gait belt;Rolling walker  Activity Tolerance Patient tolerated treatment well  Patient left in chair;with call bell/phone within reach;with nursing/sitter in room  Nurse Communication Mobility status;Need for lift equipment (lift equipment if pt fatigued)  OT Assessment/Plan  OT Plan Discharge plan remains appropriate  OT Frequency (ACUTE ONLY) Min 2X/week  Follow Up Recommendations SNF;Supervision/Assistance - 24 hour  OT Equipment Other (comment)  OT Goal Progression  Progress towards OT goals Progressing toward goals  Acute Rehab OT Goals  Patient Stated Goal return home  OT Goal Formulation With patient  Time For Goal Achievement 03/20/16  Potential to Achieve Goals Good  ADL Goals  Pt Will Perform Lower Body Bathing with min assist;with adaptive equipment;sit to/from stand  Pt Will Perform Lower Body Dressing with min assist;with adaptive equipment;sit to/from stand  Pt Will Transfer to Toilet with min assist;ambulating;bedside commode  Pt Will Perform Toileting - Clothing Manipulation and hygiene with supervision;sitting/lateral leans;with adaptive equipment  Pt/caregiver will Perform Home Exercise Program Increased strength;Both right and left upper extremity;With theraband  OT Time Calculation  OT Start Time (ACUTE ONLY) 1335  OT Stop Time (ACUTE ONLY) 1415  OT Time Calculation (min) 40 min  OT General Charges  $OT Visit 1 Procedure  OT Treatments  $Self Care/Home Management  23-37 mins  [facility], OTR/L   [phone] 03/06/2016

## 2016-03-06 NOTE — Progress Notes (Signed)
OT Evaluation - late entry  Pt presents with decline in functional status and will benefit from rehab at SNF prior to return home. Will follow acutely.     03/05/16 1400  OT Visit Information  Last OT Received On 03/05/16  Assistance Needed +2  History of Present Illness Pt adm with significant volume overload. Attempts to diurese not working well and pt to now start on CVVHD on 11/7. PMH - chronic systolic heart failure, htn, dm, pacer, copd, ckd  Precautions  Precautions Fall  Precaution Comments temp dialysis catheter R IJ  Home Living  Family/patient expects to be discharged to: Skilled nursing facility  Prior Function  Level of Independence Needs assistance  Gait / Transfers Assistance Needed walks short distances with rollator except quad cane and counter in the bathroom. Uses manual w/c with foot propulsion otherwise  ADL's / [place] can microwave food, sometimes wears depends, nephew and his wife do some cooking; mod I with bathing and dressing before she got sick   Communication / Swallowing Assistance Needed no difficulties  Communication  Communication No difficulties  Pain Assessment  Pain Assessment 0-10  Pain Score 2  Pain Location neck  Pain Descriptors / Indicators Discomfort  Pain Intervention(s) Limited activity within patient's tolerance  Cognition  Arousal/Alertness Awake/alert  Behavior During Therapy WFL for tasks assessed/performed  Overall Cognitive Status Within Functional Limits for tasks assessed  Upper Extremity Assessment  Upper Extremity Assessment Generalized weakness  Lower Extremity Assessment  Lower Extremity Assessment Generalized weakness  Cervical / Trunk Assessment  Cervical / Trunk Assessment Normal  ADL  Overall ADL's  Needs assistance/impaired  Grooming Set up;Bed level  Upper Body Bathing Set up;Bed level  Lower Body Bathing Moderate assistance;Bed level  Upper Body Dressing  Minimal assistance;Bed level  Lower  Body Dressing Maximal assistance;Bed level  Functional mobility during ADLs (unable to assess due to needing +2 A)  Bed Mobility  General bed mobility comments A to elevate trunk  OT - End of Session  Activity Tolerance Patient tolerated treatment well  Patient left in bed;with call bell/phone within reach;with nursing/sitter in room;with bed alarm set  Nurse Communication Mobility status  OT Assessment  OT Recommendation/Assessment Patient needs continued OT Services  OT Problem List Decreased strength;Decreased range of motion;Decreased activity tolerance;Impaired balance (sitting and/or standing);Decreased knowledge of use of DME or AE;Cardiopulmonary status limiting activity;Obesity;Pain;Increased edema  OT Plan  OT Frequency (ACUTE ONLY) Min 2X/week  OT Treatment/Interventions (ACUTE ONLY) Self-care/ADL training;Therapeutic exercise;Energy conservation;DME and/or AE instruction;Therapeutic activities;Patient/family education  OT Recommendation  Follow Up Recommendations SNF;Supervision/Assistance - 24 hour  OT Equipment Other (comment) (Will further assess at SNF)  Individuals Consulted  Consulted and Agree with Results and Recommendations Patient  Acute Rehab OT Goals  Patient Stated Goal return home  OT Goal Formulation With patient  Time For Goal Achievement 03/20/16  Potential to Achieve Goals Good  OT Time Calculation  OT Start Time (ACUTE ONLY) 1510  OT Stop Time (ACUTE ONLY) 1530  OT Time Calculation (min) 20 min  OT General Charges  $OT Visit 1 Procedure  OT Evaluation  $OT Eval Moderate Complexity 1 Procedure  Written Expression  Dominant Hand Right  Maurie Boettcher, OTR/L  [phone] 03/06/2016

## 2016-03-07 ENCOUNTER — Encounter (HOSPITAL_COMMUNITY): Payer: Self-pay

## 2016-03-07 LAB — RENAL FUNCTION PANEL
Albumin: 2.4 g/dL — ABNORMAL LOW (ref 3.5–5.0)
Albumin: 2.4 g/dL — ABNORMAL LOW (ref 3.5–5.0)
Anion gap: 8 (ref 5–15)
Anion gap: 8 (ref 5–15)
BUN: 29 mg/dL — ABNORMAL HIGH (ref 6–20)
BUN: 25 mg/dL — ABNORMAL HIGH (ref 6–20)
CO2: 25 mmol/L (ref 22–32)
CO2: 24 mmol/L (ref 22–32)
Calcium: 8.3 mg/dL — ABNORMAL LOW (ref 8.9–10.3)
Calcium: 8.3 mg/dL — ABNORMAL LOW (ref 8.9–10.3)
Chloride: 102 mmol/L (ref 101–111)
Chloride: 102 mmol/L (ref 101–111)
Creatinine, Ser: 1.72 mg/dL — ABNORMAL HIGH (ref 0.44–1.00)
Creatinine, Ser: 1.69 mg/dL — ABNORMAL HIGH (ref 0.44–1.00)
GFR calc Af Amer: 35 mL/min — ABNORMAL LOW (ref >60)
GFR calc Af Amer: 35 mL/min — ABNORMAL LOW (ref >60)
GFR calc non Af Amer: 30 mL/min — ABNORMAL LOW (ref >60)
GFR calc non Af Amer: 30 mL/min — ABNORMAL LOW (ref >60)
Glucose, Bld: 97 mg/dL (ref 65–99)
Glucose, Bld: 128 mg/dL — ABNORMAL HIGH (ref 65–99)
Phosphorus: 2 mg/dL — ABNORMAL LOW (ref 2.5–4.6)
Phosphorus: 1.6 mg/dL — ABNORMAL LOW (ref 2.5–4.6)
Potassium: 3.7 mmol/L (ref 3.5–5.1)
Potassium: 3.9 mmol/L (ref 3.5–5.1)
Sodium: 135 mmol/L (ref 135–145)
Sodium: 134 mmol/L — ABNORMAL LOW (ref 135–145)

## 2016-03-07 LAB — CBC WITH DIFFERENTIAL/PLATELET
Basophils Absolute: 0 K/uL (ref 0.0–0.1)
Basophils Relative: 1 %
Eosinophils Absolute: 0.3 K/uL (ref 0.0–0.7)
Eosinophils Relative: 5 %
HCT: 26.4 % — ABNORMAL LOW (ref 36.0–46.0)
Hemoglobin: 8.2 g/dL — ABNORMAL LOW (ref 12.0–15.0)
Lymphocytes Relative: 14 %
Lymphs Abs: 1 K/uL (ref 0.7–4.0)
MCH: 31.5 pg (ref 26.0–34.0)
MCHC: 31.1 g/dL (ref 30.0–36.0)
MCV: 101.5 fL — ABNORMAL HIGH (ref 78.0–100.0)
Monocytes Absolute: 1.1 K/uL — ABNORMAL HIGH (ref 0.1–1.0)
Monocytes Relative: 16 %
Neutro Abs: 4.5 K/uL (ref 1.7–7.7)
Neutrophils Relative %: 64 %
Platelets: 127 K/uL — ABNORMAL LOW (ref 150–400)
RBC: 2.6 MIL/uL — ABNORMAL LOW (ref 3.87–5.11)
RDW: 20.4 % — ABNORMAL HIGH (ref 11.5–15.5)
WBC: 6.9 K/uL (ref 4.0–10.5)

## 2016-03-07 LAB — COOXEMETRY PANEL
Carboxyhemoglobin: 2.8 % — ABNORMAL HIGH (ref 0.5–1.5)
Methemoglobin: 0.7 % (ref 0.0–1.5)
O2 Saturation: 76.9 %
Total hemoglobin: 8.4 g/dL — ABNORMAL LOW (ref 12.0–16.0)

## 2016-03-07 LAB — POCT ACTIVATED CLOTTING TIME
ACTIVATED CLOTTING TIME: 197 s
ACTIVATED CLOTTING TIME: 202 s
Activated Clotting Time: 197 s
Activated Clotting Time: 197 seconds

## 2016-03-07 LAB — APTT

## 2016-03-07 LAB — MAGNESIUM: Magnesium: 2.2 mg/dL (ref 1.7–2.4)

## 2016-03-07 MED ORDER — MIDODRINE HCL 5 MG PO TABS
10.0000 mg | ORAL_TABLET | Freq: Three times a day (TID) | ORAL | Status: DC
  Administered 2016-03-07 – 2016-03-11 (×13): 10 mg via ORAL
  Filled 2016-03-07 (×13): qty 2

## 2016-03-07 MED ORDER — MILRINONE LACTATE IN DEXTROSE 20-5 MG/100ML-% IV SOLN
0.1250 ug/kg/min | INTRAVENOUS | Status: DC
  Administered 2016-03-07 – 2016-03-08 (×3): 0.25 ug/kg/min via INTRAVENOUS
  Administered 2016-03-08: 0.125 ug/kg/min via INTRAVENOUS
  Filled 2016-03-07 (×6): qty 100

## 2016-03-07 MED ORDER — ORAL CARE MOUTH RINSE
15.0000 mL | Freq: Two times a day (BID) | OROMUCOSAL | Status: DC
  Administered 2016-03-07 – 2016-03-14 (×11): 15 mL via OROMUCOSAL

## 2016-03-07 MED ORDER — NOREPINEPHRINE BITARTRATE 1 MG/ML IV SOLN
3.0000 ug/min | INTRAVENOUS | Status: DC
  Administered 2016-03-07: 5 ug/min via INTRAVENOUS
  Filled 2016-03-07: qty 16

## 2016-03-07 NOTE — Progress Notes (Signed)
RT NOTE:  Pt will put on home CPAP when she is ready.

## 2016-03-07 NOTE — Progress Notes (Signed)
Paged Saford MD, Pt continues to have soft bps, 90/40 (50), CVP 18, unable to pull target fluid removal rate of 200. Verbal order given to keep patient even if necessary to maintain BP until pt can be assessed by nephrology in the am . Will continue to monitor pt.

## 2016-03-07 NOTE — Progress Notes (Signed)
CRITICAL VALUE ALERT  Critical value received: PTT >200  Date of notification:  11.10/17  Time of notification:  0610am  Critical value read back: yes   Nurse who received alert:  Idelia Salm   MD notified (1st page):  Marlou Porch MD   Time of first page: 0613am   MD did not page back, Critical value passed on to AM primary nurse, for rounded team to assess this morning. Pt is on Heparin via CRRT. Will continue to monitor.

## 2016-03-07 NOTE — Progress Notes (Signed)
Advanced Heart Failure Rounding Note   Subjective:    Admitted with marked volume overload. Continues with sluggish diuresis despite addition of milrinone and switch to lasix gtt.   Renal consulted 03/04/16 with poor diuresis.  Pt placed on CRRT.   Coox 76.9%  Poor tolerance of UF overnight with hypotension. Rates turned back to pulling 10mL/hr. Midodrine 10 mg TID added this am. BPs have been as low as 70s. Attempting to pull 100-120 ml/hr.   Feeling slightly down. Was dizzy yesterday with pressure dropping. Feeling better this am.   Echo: EF 60-65%, severe LVH, moderate AS with mean gradient 32 mmHg, RV not well visualized.   Objective:   Weight Range:  Vital Signs:   Temp:  [97.4 F (36.3 C)-98 F (36.7 C)] 97.5 F (36.4 C) (11/10 0419) Pulse Rate:  [54-68] 64 (11/10 0600) Resp:  [11-22] 17 (11/10 0600) BP: (79-120)/(33-50) 120/50 (11/10 0600) SpO2:  [93 %-100 %] 99 % (11/10 0600) Weight:  [343 lb (155.6 kg)-348 lb 1.7 oz (157.9 kg)] 343 lb (155.6 kg) (11/10 0500) Last BM Date: 03/06/16  Weight change: Filed Weights   03/06/16 0400 03/06/16 1400 03/07/16 0500  Weight: 40 lb (18.1 kg) (!) 348 lb 1.7 oz (157.9 kg) (!) 343 lb (155.6 kg)    Intake/Output:   Intake/Output Summary (Last 24 hours) at 03/07/16 0800 Last data filed at 03/07/16 0700  Gross per 24 hour  Intake            641.4 ml  Output             3672 ml  Net          -3030.6 ml     Physical Exam: General:  Chronically ill appearing.  Sitting up in bed. CRRT ongoing. HEENT: Normal Neck: supple. JVP remains elevated 14 cm +.  Carotids 2+ bilat; no bruits. No thyromegaly or lymphadenopathy noted.    Cor: PMI nondisplaced. Regular. 3/6 systolic crescendo-decrescendo murmur RUSB.   Lungs: Distant, diminished basilar sounds.  Abdomen: obese, soft, breakthrough bleeding  Extremities: no cyanosis, clubbing, rash, Chronic 2+ edema into thighs. RIJ Temp dialysis cath.  Neuro: alert & orientedx3, cranial  nerves grossly intact. moves all 4 extremities w/o difficulty. Affect pleasant  Telemetry: Reviewed, afib under V-pacing.   Labs: Basic Metabolic Panel:  Recent Labs Lab 03/04/16 0312 03/05/16 0430 03/05/16 0438 03/05/16 1845 03/06/16 0354 03/06/16 1604 03/07/16 0430  NA 137  --  137 133* 136 135 135  K 3.3*  --  3.6 3.6 3.6 3.7 3.7  CL 102  --  103 102 102 102 102  CO2 23  --  22 23 24 25 25   GLUCOSE 102*  --  94 164* 108* 121* 97  BUN 68*  --  54* 43* 37* 32* 29*  CREATININE 2.44*  --  2.17* 1.88* 1.72* 1.81* 1.72*  CALCIUM 8.9  --  8.9 8.4* 8.6* 8.6* 8.3*  MG 2.2  --  2.3  --  2.3  --  2.2  PHOS  --  3.2  --  2.6 2.5 2.0* 2.0*    Liver Function Tests:  Recent Labs Lab 03/05/16 0430 03/05/16 1845 03/06/16 0354 03/06/16 1604 03/07/16 0430  ALBUMIN 2.5* 2.3* 2.3* 2.4* 2.4*   No results for input(s): LIPASE, AMYLASE in the last 168 hours. No results for input(s): AMMONIA in the last 168 hours.  CBC:  Recent Labs Lab 03/03/16 0504 03/04/16 0312 03/05/16 0438 03/06/16 0354 03/07/16 0430  WBC  5.2 5.8 5.2 6.2 6.9  NEUTROABS 3.3 3.8 3.3 4.4 4.5  HGB 8.4* 7.6* 8.2* 8.4* 8.2*  HCT 27.7* 24.7* 26.9* 27.5* 26.4*  MCV 102.2* 102.9* 103.5* 100.7* 101.5*  PLT 135* 120* 144* 127* 127*    Cardiac Enzymes: No results for input(s): CKTOTAL, CKMB, CKMBINDEX, TROPONINI in the last 168 hours.  BNP: BNP (last 3 results)  Recent Labs  07/09/15 1158 07/19/15 1124 03/14/2016 1754  BNP 133.0* 149.0* 429.4*    ProBNP (last 3 results) No results for input(s): PROBNP in the last 8760 hours.    Other results:  Imaging: No results found.   Medications:     Scheduled Medications: . allopurinol  50 mg Oral Daily  . atorvastatin  20 mg Oral q1800  . chlorhexidine  15 mL Mouth Rinse BID  . cholecalciferol  5,000 Units Oral Daily  . darbepoetin (ARANESP) injection - NON-DIALYSIS  100 mcg Subcutaneous Q Wed-1800  . guaiFENesin  600 mg Oral BID  . loratadine  10  mg Oral Daily  . mouth rinse  15 mL Mouth Rinse q12n4p  . midodrine  10 mg Oral TID WC  . mometasone-formoterol  2 puff Inhalation BID  . multivitamin with minerals  1 tablet Oral Daily  . sodium chloride flush  10-40 mL Intracatheter Q12H  . sodium chloride flush  3 mL Intravenous Q12H  . cyanocobalamin  500 mcg Oral Daily  . vitamin C  500 mg Oral BID    Infusions: . heparin 10,000 units/ 20 mL infusion syringe 1,600 Units/hr (03/07/16 0737)  . milrinone 0.25 mcg/kg/min (03/07/16 0306)  . dialysis replacement fluid (prismasate) 300 mL/hr at 03/06/16 2002  . dialysis replacement fluid (prismasate) 200 mL/hr at 03/06/16 2002  . dialysate (PRISMASATE) 1,000 mL/hr at 03/07/16 0639    PRN Medications: sodium chloride, acetaminophen, albuterol, benzonatate, guaiFENesin-dextromethorphan, heparin, heparin, heparin, ipratropium-albuterol, ondansetron (ZOFRAN) IV, sodium chloride flush, sodium chloride flush   Assessment/Plan/Discussion    Robyn Porter is a 65 y.o. female with history with chronic systolic CHF, Anemia, OHS/OSA, symptomatic bradycardia s/p MDT PPm with CRT-P upgrade given high percentage of RV pacing, CKD stage IV.  Admitted with marked volume overload.  1. Chronic diastolic CHF: EF [id number] on echo this admission, RV poorly visualized. Recent upgrade to [employer] CRT given constant RV pacing. Volume status markedly elevated. Weight up 30 lbs since August.  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  Renal function stable.  Milrinone begun for RV support, co-ox 69%.  Had to start CVVH for volume removal given diuretic failure. BP soft overnight.  - Continue milrinone 0.25 mcg for RV support. BP stable.   - Continue CVVH. Pulling ~100 this am with soft pressures. Midodrine added at 10 mg tid.  - OK to use pressor support if needed could use norepinephrine but hopefully midodrine will be enough. HF team would manage.  - Ongoing  discussion about need for IHD. Will need to observe for renal recovery once CRRT completed.  - Off BB with low coox on admission.  2. Symptomatic bradycardia: MDT PPM. Upgraded to CRT given high percentage RV pacing.  3. ARF on CKD Stage IV/V - Remains on CVVHD but poorly tolerating now with hypotension.  Midodrine added. 4. Chronic atrial fibrillation:  - Hold Toprol XL for now with low output. - On heparin gtt while getting CVVH.   5. OHS/OSA:Continue nightly CPAP.  6. 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 Given 1UPRBC on admit.  - States she has occasional  hemorrhoidal bleed - Has been followed by Dr. Lindi Adie who is working up anemia - Hgb 8.4 -> 7.6 -> 8.2 -> 8.4 -> 8.2. Continue to follow.  7. Chronic venous stasis:  - Chronic changes, but stable. No cellulitis appreciated.   8. Morbid obesity:  - PT following. Recommending HHPT vs SNF.   9. Hypokalemia - Stable on CVVHD.   10. Aortic stenosis: - Moderate by echo 03/02/16.    Length of Stay: [place], [place] 03/07/2016, 8:00 AM  Advanced Heart Failure Team Pager [phone] (M-F; 7a - 4p)  Please contact [facility] for night-coverage after hours (4p -7a ) and weekends on [email or web address]  Patient seen with PA, agree with the above note. Has been getting CVVH for fluid removal, weight down another 5 lbs.  However, BP lower today and UF decreased.  Added midodrine this morning, SBP 100s now and trying to increase UF rate.  Can add norepinephrine if needed to allow fluid removal, hopefully midodrine is enough.  [name] 03/07/2016 8:26 AM

## 2016-03-07 NOTE — Progress Notes (Signed)
[facility] Rounding Note  Subjective/Interval History:   CRRT started about 5PM 11/7 No accurate weights  No filter issues Filter life 72 hours Unfortunately BP has been low and not tolerating UF  Objective Vital signs in last 24 hours: Vitals:   03/07/16 0400 03/07/16 0419 03/07/16 0500 03/07/16 0600  BP: (!) 102/45 (!) 102/45 (!) 107/49 (!) 120/50  Pulse: 61 64 67 64  Resp: 13 13 (!) 22 17  Temp:  97.5 F (36.4 C)    TempSrc:  Oral    SpO2: 100% 97% 97% 99%  Weight:   (!) 155.6 kg (343 lb)   Height:       Weight change: 139.8 kg (308 lb 1.7 oz)  Intake/Output Summary (Last 24 hours) at 03/07/16 0732 Last data filed at 03/07/16 0700  Gross per 24 hour  Intake            655.1 ml  Output             3890 ml  Net          -3234.9 ml   Physical Exam:  Blood pressure (!) 112/41, pulse (!) 48, temperature 97.4 F (36.3 C), temperature source Oral, resp. rate 17, height 5\' 4"  (1.626 m), weight (!) 162.9 kg (359 lb 3.2 oz), SpO2 98 %.  [name] VS as noted Not on O2 R IJ temp cath (11/7) Lungs grossly clear though coarser BS on the left posteriorly Left sided pacemaker [id number] No S3 2/6 systolic murmur USB heard up into the neck and across precordium unchamged Abdomen obese, large pannus 3+ pitting edema both LE's to knees, pitting of thighs, pitting of abd wall and up to axillary area - unchanged PICC line in R arm (to be removed - have asked again)  No change overall in exam   Recent Labs Lab 03/03/16 0504 03/04/16 0312 03/05/16 0430 03/05/16 0438 03/05/16 1845 03/06/16 0354 03/06/16 1604 03/07/16 0430  NA 133* 137  --  137 133* 136 135 135  K 3.2* 3.3*  --  3.6 3.6 3.6 3.7 3.7  CL 99* 102  --  103 102 102 102 102  CO2 23 23  --  22 23 24 25 25   GLUCOSE 192* 102*  --  94 164* 108* 121* 97  BUN 63* 68*  --  54* 43* 37* 32* 29*  CREATININE 2.47* 2.44*  --  2.17* 1.88* 1.72* 1.81* 1.72*  CALCIUM 8.7* 8.9  --  8.9 8.4* 8.6* 8.6* 8.3*  PHOS  --   --  3.2   --  2.6 2.5 2.0* 2.0*     Recent Labs Lab 03/06/16 0354 03/06/16 1604 03/07/16 0430  ALBUMIN 2.3* 2.4* 2.4*     Recent Labs Lab 03/04/16 0312 03/05/16 0438 03/06/16 0354 03/07/16 0430  WBC 5.8 5.2 6.2 6.9  NEUTROABS 3.8 3.3 4.4 4.5  HGB 7.6* 8.2* 8.4* 8.2*  HCT 24.7* 26.9* 27.5* 26.4*  MCV 102.9* 103.5* 100.7* 101.5*  PLT 120* 144* 127* 127*   Results for Robyn Porter, Robyn Porter (MRN [id number]) as of 03/06/2016 07:28  Ref. Range 03/04/2016 14:10  PTH Latest Ref Range: 15 - 65 pg/mL 50     No results for input(s): IRON, TIBC, TRANSFERRIN, FERRITIN in the last 168 hours. Studies/Results: No results found. Medications: . heparin 10,000 units/ 20 mL infusion syringe 1,600 Units/hr (03/07/16 0700)  . milrinone 0.25 mcg/kg/min (03/07/16 0306)  . dialysis replacement fluid (prismasate) 300 mL/hr at 03/06/16 2002  . dialysis replacement fluid (  prismasate) 200 mL/hr at 03/06/16 2002  . dialysate (PRISMASATE) 1,000 mL/hr at 03/07/16 0639   . allopurinol  50 mg Oral Daily  . atorvastatin  20 mg Oral q1800  . chlorhexidine  15 mL Mouth Rinse BID  . cholecalciferol  5,000 Units Oral Daily  . darbepoetin (ARANESP) injection - NON-DIALYSIS  100 mcg Subcutaneous Q Wed-1800  . guaiFENesin  600 mg Oral BID  . loratadine  10 mg Oral Daily  . mouth rinse  15 mL Mouth Rinse q12n4p  . midodrine  10 mg Oral TID WC  . mometasone-formoterol  2 puff Inhalation BID  . multivitamin with minerals  1 tablet Oral Daily  . sodium chloride flush  10-40 mL Intracatheter Q12H  . sodium chloride flush  3 mL Intravenous Q12H  . cyanocobalamin  500 mcg Oral Daily  . vitamin C  500 mg Oral BID     Background: 65 y.o. 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 Hollie Salk at [facility] just once; baseline has been worsening over course of 2017),  super morbid obesity, OHS/OSA (CPAP), dCHF, AS, pacemaker, AF. Has been admitted at least 3X this year with massive volume overload (diuresed  27 kg during a 05/2015 admission, 5.5 liters May admission (had pacer upgrade), August diuresed to 333 lb (not sure how much weight lost).  On the current occasion she is admitted with a 30 lb weight increase since August, SOB, worsening edema. Has rec'd IV lasix 160 every 8 + metolazone 5 BID + milronone and has failed to achieve a negative fluid balance. Creatinine has been 2.4-2.7 during the admission.  We are asked to see for the possibility of CRRT. In speaking with her she is agreeable, and acknowledges that she could end up on dialysis permanently if we cannot manage her fluids with diuretics after we get her unloaded with CRRT. Catheter placed and CRRT started 11/7.   Assessment/Recommendations  1. Diuretic refractory CHF/volume overload - CRRT started late 11/7 - goal 150-200 off. Weights not helpful on current bed Not tolerating UF d/t blood pressure. Add midodrine 10 mg TID. Continue with all 4K fluids/300/200/1000/heparin in circuit. K and phos OK so far.Will need to address with Dr. Aundra Dubin if midodrine does not improve BP if IV pressors could be added (since volume removal whole point of this endeavor) 2. Hypotension - not tol UF. Adding midodrine. May need pressors. See above. 3. Chronic dCHF. EF 60-65%. Suspicion primary RV failure. ECHO this admission report states "nl RV fx" and LVEF nl. On milrinone. 4. Pacemaker status 5. AFib - Heparin with CRRT and peripheral heparin levels high. No bleeding noted 6. CKD4 -Baseline creatinine has been worsening over the course of 2017 - most recently around 2.4-2.6. It is entirely possible (in fact very likely) that she will end up dialysis dependent if, after getting her volume down with CRRT, her fluids can't be managed with diuretics. PICC line to be removed (future vascular access site) 7. Anemia - h/o AVM's. Fe deficient. Dosed with feraheme 11/7. Transfused 1 unit PRBC's 11/8, 2 u 11/3. Started Aranesp 100/week 1st dosed [id number]. 8. Metabolic bone  - PTH 50, no intervention needed. Ca and phos OK 9. OHS/OSA - CPAP at night. Has own machine. 10. Super morbid obesity. Need to keep her mobile. 11. PICC out (access now an issue)   Jamal Maes, MD [place] [phone] pager 03/07/2016, 7:32 AM

## 2016-03-07 NOTE — Progress Notes (Signed)
Guardian Disability Claim Faxed 02/18/16 completed, signed, and return faxed with all requested documents 11/27/2015--present (123 pages total) to provided fax # [phone] Dr. Aundra Dubin wrote patient out of work permanently. Claim # [id number] Copy of request scanned into patient's electronic medical record.  Renee Pain, RN

## 2016-03-08 ENCOUNTER — Other Ambulatory Visit: Payer: Self-pay | Admitting: Internal Medicine

## 2016-03-08 LAB — COOXEMETRY PANEL
Carboxyhemoglobin: 2.8 % — ABNORMAL HIGH (ref 0.5–1.5)
METHEMOGLOBIN: 0.6 % (ref 0.0–1.5)
O2 Saturation: 75 %
TOTAL HEMOGLOBIN: 8.5 g/dL — AB (ref 12.0–16.0)

## 2016-03-08 LAB — RENAL FUNCTION PANEL
ANION GAP: 8 (ref 5–15)
Albumin: 2.5 g/dL — ABNORMAL LOW (ref 3.5–5.0)
Albumin: 2.4 g/dL — ABNORMAL LOW (ref 3.5–5.0)
Anion gap: 8 (ref 5–15)
BUN: 21 mg/dL — ABNORMAL HIGH (ref 6–20)
BUN: 20 mg/dL (ref 6–20)
CALCIUM: 8.4 mg/dL — AB (ref 8.9–10.3)
CHLORIDE: 102 mmol/L (ref 101–111)
CHLORIDE: 100 mmol/L — AB (ref 101–111)
CO2: 25 mmol/L (ref 22–32)
CO2: 25 mmol/L (ref 22–32)
CREATININE: 1.59 mg/dL — AB (ref 0.44–1.00)
CREATININE: 1.75 mg/dL — AB (ref 0.44–1.00)
Calcium: 8.4 mg/dL — ABNORMAL LOW (ref 8.9–10.3)
GFR, EST AFRICAN AMERICAN: 38 mL/min — AB (ref >60)
GFR, EST AFRICAN AMERICAN: 34 mL/min — AB (ref >60)
GFR, EST NON AFRICAN AMERICAN: 33 mL/min — AB (ref >60)
GFR, EST NON AFRICAN AMERICAN: 29 mL/min — AB (ref >60)
Glucose, Bld: 110 mg/dL — ABNORMAL HIGH (ref 65–99)
Glucose, Bld: 126 mg/dL — ABNORMAL HIGH (ref 65–99)
POTASSIUM: 3.8 mmol/L (ref 3.5–5.1)
Phosphorus: 2.1 mg/dL — ABNORMAL LOW (ref 2.5–4.6)
Phosphorus: 1.8 mg/dL — ABNORMAL LOW (ref 2.5–4.6)
Potassium: 3.8 mmol/L (ref 3.5–5.1)
SODIUM: 133 mmol/L — AB (ref 135–145)
Sodium: 135 mmol/L (ref 135–145)

## 2016-03-08 LAB — CBC WITH DIFFERENTIAL/PLATELET
Basophils Absolute: 0.1 10*3/uL (ref 0.0–0.1)
Basophils Relative: 1 %
EOS ABS: 0.3 10*3/uL (ref 0.0–0.7)
EOS PCT: 4 %
HCT: 29 % — ABNORMAL LOW (ref 36.0–46.0)
Hemoglobin: 8.7 g/dL — ABNORMAL LOW (ref 12.0–15.0)
LYMPHS ABS: 1.3 10*3/uL (ref 0.7–4.0)
LYMPHS PCT: 15 %
MCH: 30.7 pg (ref 26.0–34.0)
MCHC: 30 g/dL (ref 30.0–36.0)
MCV: 102.5 fL — AB (ref 78.0–100.0)
MONO ABS: 1.5 10*3/uL — AB (ref 0.1–1.0)
MONOS PCT: 18 %
Neutro Abs: 5.5 10*3/uL (ref 1.7–7.7)
Neutrophils Relative %: 62 %
PLATELETS: 155 10*3/uL (ref 150–400)
RBC: 2.83 MIL/uL — AB (ref 3.87–5.11)
RDW: 20 % — AB (ref 11.5–15.5)
WBC: 8.6 10*3/uL (ref 4.0–10.5)

## 2016-03-08 LAB — POCT ACTIVATED CLOTTING TIME
ACTIVATED CLOTTING TIME: 180 s
ACTIVATED CLOTTING TIME: 197 s
ACTIVATED CLOTTING TIME: 202 s
ACTIVATED CLOTTING TIME: 213 s
ACTIVATED CLOTTING TIME: 219 s
ACTIVATED CLOTTING TIME: 208 s
Activated Clotting Time: 186 seconds
Activated Clotting Time: 202 seconds
Activated Clotting Time: 219 seconds

## 2016-03-08 LAB — MAGNESIUM: MAGNESIUM: 2.4 mg/dL (ref 1.7–2.4)

## 2016-03-08 LAB — APTT

## 2016-03-08 MED ORDER — SODIUM CHLORIDE 0.9 % IV SOLN: INTRAVENOUS | Status: DC | PRN

## 2016-03-08 NOTE — Procedures (Signed)
Arterial Catheter Insertion Procedure Note Robyn Porter [id number] 10/03/1950  Procedure: Insertion of Arterial Catheter  Indications: Blood pressure monitoring  Procedure Details Consent: Risks of procedure as well as the alternatives and risks of each were explained to the (patient/caregiver).  Consent for procedure obtained. Time Out: Verified patient identification, verified procedure, site/side was marked, verified correct patient position, special equipment/implants available, medications/allergies/relevent history reviewed, required imaging and test results available.  Performed  Maximum sterile technique was used including antiseptics, cap, gloves, gown, hand hygiene, mask and sheet. Skin prep: Chlorhexidine; local anesthetic administered 20 gauge catheter was inserted into left radial artery using the Seldinger technique.  Evaluation Blood flow good; BP tracing good. Complications: No apparent complications.   Mcneil Sober 03/08/2016

## 2016-03-08 NOTE — Progress Notes (Signed)
[facility] Rounding Note  Subjective/Interval History:   CRRT started about 5PM 11/7 No accurate weights since starting  No filter issues Filter life 72 hours Midodrine added for low BP - not enough - now on norepi Able to pull up to 200 some hours  No new complaints. Still has cough and DOE  Objective Vital signs in last 24 hours: Vitals:   03/08/16 0600 03/08/16 0700 03/08/16 0730 03/08/16 0820  BP: (!) 112/39 (!) 87/55 (!) 99/37   Pulse: 64 60 66   Resp: 16 17 14    Temp:      TempSrc:      SpO2: 100% (!) 85% 97% 100%  Weight:      Height:       Weight change: 5.395 kg (11 lb 14.3 oz)  Intake/Output Summary (Last 24 hours) at 03/08/16 0833 Last data filed at 03/08/16 0700  Gross per 24 hour  Intake          1058.63 ml  Output             5240 ml  Net         -4181.37 ml   Physical Exam:  Blood pressure (!) 112/41, pulse (!) 48, temperature 97.4 F (36.3 C), temperature source Oral, resp. rate 17, height 5\' 4"  (1.626 m), weight (!) 162.9 kg (359 lb 3.2 oz), SpO2 98 %.  [name] VS as noted Not on O2 R IJ temp cath (11/7) Lungs grossly clear  Left sided pacemaker [id number] No S3 2/6 systolic murmur USB heard up into the neck and across precordium unchamged Abdomen obese, large pannus 3+ pitting edema both LE's to knees, pitting of thighs, pitting of abd wall and up to axillary area - unchanged Some wrinkling in feet.  Remains without change overall in exam   Recent Labs Lab 03/05/16 0430 03/05/16 0438 03/05/16 1845 03/06/16 0354 03/06/16 1604 03/07/16 0430 03/07/16 1704 03/08/16 0500  NA  --  137 133* 136 135 135 134* 135  K  --  3.6 3.6 3.6 3.7 3.7 3.9 3.8  CL  --  103 102 102 102 102 102 102  CO2  --  22 23 24 25 25 24 25   GLUCOSE  --  94 164* 108* 121* 97 128* 110*  BUN  --  54* 43* 37* 32* 29* 25* 21*  CREATININE  --  2.17* 1.88* 1.72* 1.81* 1.72* 1.69* 1.59*  CALCIUM  --  8.9 8.4* 8.6* 8.6* 8.3* 8.3* 8.4*  PHOS 3.2  --  2.6 2.5 2.0* 2.0*  1.6* 2.1*     Recent Labs Lab 03/07/16 0430 03/07/16 1704 03/08/16 0500  ALBUMIN 2.4* 2.4* 2.5*     Recent Labs Lab 03/05/16 0438 03/06/16 0354 03/07/16 0430 03/08/16 0500  WBC 5.2 6.2 6.9 8.6  NEUTROABS 3.3 4.4 4.5 5.5  HGB 8.2* 8.4* 8.2* 8.7*  HCT 26.9* 27.5* 26.4* 29.0*  MCV 103.5* 100.7* 101.5* 102.5*  PLT 144* 127* 127* 155   Results for Robyn, Porter (MRN [id number]) as of 03/06/2016 07:28  Ref. Range 03/04/2016 14:10  PTH Latest Ref Range: 15 - 65 pg/mL 50    Medications: . heparin 10,000 units/ 20 mL infusion syringe 1,700 Units/hr (03/08/16 0514)  . milrinone 0.125 mcg/kg/min (03/08/16 0825)  . norepinephrine (LEVOPHED) Adult infusion 5 mcg/min (03/07/16 2100)  . dialysis replacement fluid (prismasate) 300 mL/hr at 03/07/16 1259  . dialysis replacement fluid (prismasate) 200 mL/hr at 03/07/16 2245  . dialysate (PRISMASATE) 1,000 mL/hr at 03/07/16  2245   . allopurinol  50 mg Oral Daily  . atorvastatin  20 mg Oral q1800  . cholecalciferol  5,000 Units Oral Daily  . darbepoetin (ARANESP) injection - NON-DIALYSIS  100 mcg Subcutaneous Q Wed-1800  . guaiFENesin  600 mg Oral BID  . loratadine  10 mg Oral Daily  . mouth rinse  15 mL Mouth Rinse BID  . midodrine  10 mg Oral TID WC  . mometasone-formoterol  2 puff Inhalation BID  . multivitamin with minerals  1 tablet Oral Daily  . sodium chloride flush  10-40 mL Intracatheter Q12H  . cyanocobalamin  500 mcg Oral Daily  . vitamin C  500 mg Oral BID     Background: 65 y.o. 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 Hollie Salk; Baseline worsening over course of 2017),  super morbid obesity, OHS/OSA (CPAP), dCHF, AS, pacemaker, AF. Admitted at least 3X this year with massive volume overload (diuresed 27 kg during a 05/2015 admission, 5.5 liters May admission (had pacer upgrade), August diuresed to 333 lb (not sure how much weight lost).  Admitted 11/1 with 30 lb weight increase since August, SOB, worsening edema. Has  rec'd IV lasix 160 every 8 + metolazone 5 BID + milronone - unable to achieve neg fluid balance. Creatinine has been 2.4-2.7 during the admission.  Renal consulted for possibility of CRRT.  Pt was agreeable, and acknowledged/accepted that she could end up on dialysis permanently. Catheter placed and CRRT started 11/7. Norepi added 11/10 for BP.   Assessment/Recommendations  1. Diuretic refractory CHF/volume overload - CRRT started late 11/7 - goal 200-250 off. Unable to get ANY accurate weights.  Hypotension limiting UF, midodrine no help. Norepi added yesterday.Continue with all 4K fluids/300/200/1000/heparin in circuit. K and phos OK so far. A-line would be helpful for BP monitoring 2. Chronic dCHF. EF 60-65%. Suspicion primary RV failure. ECHO this admission report states "nl RV fx" and LVEF nl. On milrinone. 3. AS - "moderate" on TTE. Dr. Aundra Dubin indicates that TEE best way to assess. Another reason to be careful dropping preload too fast. (and another reason for UF intolerance potentially). 4. Pacemaker status 5. AFib - Heparin with CRRT and peripheral heparin levels high. No bleeding noted 6. CKD4 -Baseline creatinine has been worsening over the course of 2017 - most recently around 2.4-2.6. It is entirely possible (in fact very likely) that she will end up dialysis dependent if, after getting her volume down with CRRT, her fluids can't be managed with diuretics. PICC line to be removed (future vascular access site) 7. Anemia - h/o AVM's. Fe deficient. Dosed with feraheme 11/7. Transfused 1 unit PRBC's 11/8, 2 u 11/3. Started Aranesp 100/week 1st dosed 11/8. Stable to improved. 8. Metabolic bone - PTH 50, no intervention needed. Ca and phos OK 9. OHS/OSA - CPAP at night. Has own machine. 10. Super morbid obesity. Need to keep her mobile. 11. PICC out (access now an issue)   Jamal Maes, MD [place] [phone] pager 03/08/2016, 8:33 AM

## 2016-03-08 NOTE — Progress Notes (Signed)
Patient ID: Robyn Porter, female   DOB: 07-16-1950, 65 y.o.   MRN: [id number]    Advanced Heart Failure Rounding Note   Subjective:    Admitted with marked volume overload. Sluggish diuresis despite addition of milrinone and switch to lasix gtt.   Renal consulted 03/04/16 with poor diuresis.  Pt placed on CRRT.   Coox 75%, CVP 16 today.   Norepinephrine started at low dose after midodrine did not maintain BP adequately.  Now able to remove fluid at 250 cc/hr.  Using forearm BP cuff so not sure how accurate.  No dizziness.    Echo: EF 60-65%, severe LVH, moderate AS with mean gradient 32 mmHg, RV not well visualized.   Objective:   Weight Range:  Vital Signs:   Temp:  [97.5 F (36.4 C)-98 F (36.7 C)] 97.5 F (36.4 C) (11/11 0311) Pulse Rate:  [60-69] 66 (11/11 0730) Resp:  [12-21] 14 (11/11 0730) BP: (87-125)/(33-56) 99/37 (11/11 0730) SpO2:  [85 %-100 %] 100 % (11/11 0820) Weight:  [360 lb (163.3 kg)] 360 lb (163.3 kg) (11/11 0459) Last BM Date: 03/07/16  Weight change: Filed Weights   03/06/16 1400 03/07/16 0500 03/08/16 0459  Weight: (!) 348 lb 1.7 oz (157.9 kg) (!) 343 lb (155.6 kg) (!) 360 lb (163.3 kg)    Intake/Output:   Intake/Output Summary (Last 24 hours) at 03/08/16 0831 Last data filed at 03/08/16 0700  Gross per 24 hour  Intake          1058.63 ml  Output             5240 ml  Net         -4181.37 ml     Physical Exam: General:  Chronically ill appearing.  Sitting up in bed. CRRT ongoing. HEENT: Normal Neck: supple. JVP remains elevated 14 cm +.  Carotids 2+ bilat; no bruits. No thyromegaly or lymphadenopathy noted.    Cor: PMI nondisplaced. Regular. 3/6 systolic crescendo-decrescendo murmur RUSB.   Lungs: Distant, diminished basilar sounds.  Abdomen: obese, soft, breakthrough bleeding  Extremities: no cyanosis, clubbing, rash, Chronic 2+ edema into thighs. RIJ Temp dialysis cath.  Neuro: alert & orientedx3, cranial nerves grossly intact. moves  all 4 extremities w/o difficulty. Affect pleasant  Telemetry: Reviewed, afib under V-pacing.   Labs: Basic Metabolic Panel:  Recent Labs Lab 03/04/16 0312  03/05/16 0438  03/06/16 0354 03/06/16 1604 03/07/16 0430 03/07/16 1704 03/08/16 0500  NA 137  --  137  < > 136 135 135 134* 135  K 3.3*  --  3.6  < > 3.6 3.7 3.7 3.9 3.8  CL 102  --  103  < > 102 102 102 102 102  CO2 23  --  22  < > 24 25 25 24 25   GLUCOSE 102*  --  94  < > 108* 121* 97 128* 110*  BUN 68*  --  54*  < > 37* 32* 29* 25* 21*  CREATININE 2.44*  --  2.17*  < > 1.72* 1.81* 1.72* 1.69* 1.59*  CALCIUM 8.9  --  8.9  < > 8.6* 8.6* 8.3* 8.3* 8.4*  MG 2.2  --  2.3  --  2.3  --  2.2  --  2.4  PHOS  --   < >  --   < > 2.5 2.0* 2.0* 1.6* 2.1*  < > = values in this interval not displayed.  Liver Function Tests:  Recent Labs Lab 03/06/16 0354 03/06/16 1604 03/07/16 0430 03/07/16  1704 03/08/16 0500  ALBUMIN 2.3* 2.4* 2.4* 2.4* 2.5*   No results for input(s): LIPASE, AMYLASE in the last 168 hours. No results for input(s): AMMONIA in the last 168 hours.  CBC:  Recent Labs Lab 03/04/16 0312 03/05/16 0438 03/06/16 0354 03/07/16 0430 03/08/16 0500  WBC 5.8 5.2 6.2 6.9 8.6  NEUTROABS 3.8 3.3 4.4 4.5 5.5  HGB 7.6* 8.2* 8.4* 8.2* 8.7*  HCT 24.7* 26.9* 27.5* 26.4* 29.0*  MCV 102.9* 103.5* 100.7* 101.5* 102.5*  PLT 120* 144* 127* 127* 155    Cardiac Enzymes: No results for input(s): CKTOTAL, CKMB, CKMBINDEX, TROPONINI in the last 168 hours.  BNP: BNP (last 3 results)  Recent Labs  07/09/15 1158 07/19/15 1124 03/24/2016 1754  BNP 133.0* 149.0* 429.4*    ProBNP (last 3 results) No results for input(s): PROBNP in the last 8760 hours.    Other results:  Imaging: No results found.   Medications:     Scheduled Medications: . allopurinol  50 mg Oral Daily  . atorvastatin  20 mg Oral q1800  . cholecalciferol  5,000 Units Oral Daily  . darbepoetin (ARANESP) injection - NON-DIALYSIS  100 mcg  Subcutaneous Q Wed-1800  . guaiFENesin  600 mg Oral BID  . loratadine  10 mg Oral Daily  . mouth rinse  15 mL Mouth Rinse BID  . midodrine  10 mg Oral TID WC  . mometasone-formoterol  2 puff Inhalation BID  . multivitamin with minerals  1 tablet Oral Daily  . sodium chloride flush  10-40 mL Intracatheter Q12H  . cyanocobalamin  500 mcg Oral Daily  . vitamin C  500 mg Oral BID    Infusions: . heparin 10,000 units/ 20 mL infusion syringe 1,700 Units/hr (03/08/16 0514)  . milrinone 0.125 mcg/kg/min (03/08/16 0825)  . norepinephrine (LEVOPHED) Adult infusion 5 mcg/min (03/07/16 2100)  . dialysis replacement fluid (prismasate) 300 mL/hr at 03/07/16 1259  . dialysis replacement fluid (prismasate) 200 mL/hr at 03/07/16 2245  . dialysate (PRISMASATE) 1,000 mL/hr at 03/07/16 2245    PRN Medications: Place/Maintain arterial line **AND** sodium chloride, acetaminophen, albuterol, benzonatate, guaiFENesin-dextromethorphan, heparin, heparin, heparin, ipratropium-albuterol, ondansetron (ZOFRAN) IV, sodium chloride flush   Assessment/Plan/Discussion    Robyn Porter is a 65 y.o. female with history with chronic systolic CHF, Anemia, OHS/OSA, symptomatic bradycardia s/p MDT PPm with CRT-P upgrade given high percentage of RV pacing, CKD stage IV.  Admitted with marked volume overload.  1. Chronic diastolic CHF: EF 123456 on echo this admission, RV poorly visualized. Recent upgrade to Medtronic CRT given constant RV pacing. Volume status markedly elevated. Weight up 30 lbs since August.  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  Renal function stable.  Milrinone begun for RV support.   Had to start CVVH for volume removal given diuretic failure. Now on midodrine + norepinephrine 5 with soft BP.  Not sure how accurate BP measurements are with forearm cuff.  Co-ox 75% with CVP 16.  Still volume overloaded.  - Decrease milrinone to 0.125 with norepinephrine use.     - Continue CVVH. Pulling ~250 cc/hr this am.   - Continue norepinephrine at 5.   - Ongoing discussion about need for IHD. Will need to observe for renal recovery once CRRT completed.  - Off BB with low coox on admission.  2. Symptomatic bradycardia: MDT PPM. Upgraded to CRT given high percentage RV pacing.  3. ARF on CKD Stage IV/V: Remains on CVVHD, UF at 250 cc/hr. 4. Chronic atrial  fibrillation:  - Hold Toprol XL for now with low output.  HR ok.  - On heparin gtt while getting CVVH.   5. OHS/OSA: Continue nightly CPAP.  6. 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 Given 1UPRBC on admit.  - States she has occasional hemorrhoidal bleed - Has been followed by Dr. Lindi Adie who is working up anemia - Hgb 8.4 -> 7.6 -> 8.2 -> 8.4 -> 8.2 -> 8.7. Continue to follow.  7. Chronic venous stasis: Chronic changes, but stable. No cellulitis appreciated.   8. Morbid obesity: PT following. Recommending HHPT vs SNF.   9. Hypokalemia: Stable on CVVHD.   10. Aortic stenosis:  Moderate by echo 03/02/16.  Muffled S2 concerning that AS could be worse.  Would consider evaluation via TEE eventually.   Length of Stay: [address] 03/08/2016, 8:31 AM  Advanced Heart Failure Team Pager [phone] (M-F; 7a - 4p)  Please contact [facility] for night-coverage after hours (4p -7a ) and weekends on [email or web address]

## 2016-03-08 NOTE — Progress Notes (Signed)
Patient places herself on CPAP. Home unit is being used and at beside ready to her to place on.

## 2016-03-09 LAB — CBC WITH DIFFERENTIAL/PLATELET
BASOS ABS: 0 10*3/uL (ref 0.0–0.1)
Basophils Relative: 1 %
Eosinophils Absolute: 0.3 10*3/uL (ref 0.0–0.7)
Eosinophils Relative: 4 %
HCT: 27.3 % — ABNORMAL LOW (ref 36.0–46.0)
HEMOGLOBIN: 8.3 g/dL — AB (ref 12.0–15.0)
LYMPHS ABS: 1.2 10*3/uL (ref 0.7–4.0)
LYMPHS PCT: 15 %
MCH: 31.1 pg (ref 26.0–34.0)
MCHC: 30.4 g/dL (ref 30.0–36.0)
MCV: 102.2 fL — AB (ref 78.0–100.0)
Monocytes Absolute: 0.9 10*3/uL (ref 0.1–1.0)
Monocytes Relative: 11 %
NEUTROS PCT: 69 %
Neutro Abs: 5.7 10*3/uL (ref 1.7–7.7)
Platelets: 132 10*3/uL — ABNORMAL LOW (ref 150–400)
RBC: 2.67 MIL/uL — AB (ref 3.87–5.11)
RDW: 19.7 % — ABNORMAL HIGH (ref 11.5–15.5)
WBC: 8.2 10*3/uL (ref 4.0–10.5)

## 2016-03-09 LAB — APTT: APTT: 193 s — AB (ref 24–36)

## 2016-03-09 LAB — BASIC METABOLIC PANEL
ANION GAP: 6 (ref 5–15)
BUN: 17 mg/dL (ref 6–20)
CHLORIDE: 102 mmol/L (ref 101–111)
CO2: 26 mmol/L (ref 22–32)
Calcium: 8.5 mg/dL — ABNORMAL LOW (ref 8.9–10.3)
Creatinine, Ser: 1.77 mg/dL — ABNORMAL HIGH (ref 0.44–1.00)
GFR calc Af Amer: 33 mL/min — ABNORMAL LOW (ref >60)
GFR, EST NON AFRICAN AMERICAN: 29 mL/min — AB (ref >60)
GLUCOSE: 110 mg/dL — AB (ref 65–99)
POTASSIUM: 3.9 mmol/L (ref 3.5–5.1)
Sodium: 134 mmol/L — ABNORMAL LOW (ref 135–145)

## 2016-03-09 LAB — RENAL FUNCTION PANEL
Albumin: 2.3 g/dL — ABNORMAL LOW (ref 3.5–5.0)
Albumin: 2.5 g/dL — ABNORMAL LOW (ref 3.5–5.0)
Anion gap: 7 (ref 5–15)
Anion gap: 8 (ref 5–15)
BUN: 18 mg/dL (ref 6–20)
BUN: 18 mg/dL (ref 6–20)
CALCIUM: 8.4 mg/dL — AB (ref 8.9–10.3)
CHLORIDE: 101 mmol/L (ref 101–111)
CHLORIDE: 99 mmol/L — AB (ref 101–111)
CO2: 26 mmol/L (ref 22–32)
CO2: 24 mmol/L (ref 22–32)
CREATININE: 1.77 mg/dL — AB (ref 0.44–1.00)
CREATININE: 1.84 mg/dL — AB (ref 0.44–1.00)
Calcium: 8.4 mg/dL — ABNORMAL LOW (ref 8.9–10.3)
GFR calc non Af Amer: 29 mL/min — ABNORMAL LOW (ref >60)
GFR calc non Af Amer: 27 mL/min — ABNORMAL LOW (ref >60)
GFR, EST AFRICAN AMERICAN: 33 mL/min — AB (ref >60)
GFR, EST AFRICAN AMERICAN: 32 mL/min — AB (ref >60)
GLUCOSE: 119 mg/dL — AB (ref 65–99)
Glucose, Bld: 100 mg/dL — ABNORMAL HIGH (ref 65–99)
Phosphorus: 2.1 mg/dL — ABNORMAL LOW (ref 2.5–4.6)
Phosphorus: 1.8 mg/dL — ABNORMAL LOW (ref 2.5–4.6)
Potassium: 3.9 mmol/L (ref 3.5–5.1)
Potassium: 3.8 mmol/L (ref 3.5–5.1)
SODIUM: 134 mmol/L — AB (ref 135–145)
SODIUM: 131 mmol/L — AB (ref 135–145)

## 2016-03-09 LAB — POCT ACTIVATED CLOTTING TIME
ACTIVATED CLOTTING TIME: 235 s
ACTIVATED CLOTTING TIME: 213 s
Activated Clotting Time: 208 seconds
Activated Clotting Time: 197 seconds
Activated Clotting Time: 197 seconds

## 2016-03-09 LAB — COOXEMETRY PANEL
CARBOXYHEMOGLOBIN: 2.3 % — AB (ref 0.5–1.5)
METHEMOGLOBIN: 0.8 % (ref 0.0–1.5)
O2 Saturation: 92.8 %
TOTAL HEMOGLOBIN: 7.6 g/dL — AB (ref 12.0–16.0)

## 2016-03-09 LAB — MAGNESIUM: Magnesium: 2.3 mg/dL (ref 1.7–2.4)

## 2016-03-09 MED ORDER — HYDROCOD POLST-CPM POLST ER 10-8 MG/5ML PO SUER
5.0000 mL | Freq: Two times a day (BID) | ORAL | Status: DC | PRN
  Administered 2016-03-09: 5 mL via ORAL
  Filled 2016-03-09: qty 5

## 2016-03-09 NOTE — Progress Notes (Signed)
[facility] Rounding Note  Subjective/Interval History:   CRRT started about 5PM 11/7 No accurate weights since starting (vary from 18 kg (yep...) to 191 kg.  RN will try to stand her today Bed weight 331 lb today  Fortunately tolerating UF with neo off since last night! (still on midodrine) Net neg 250/hour most hours  No filter issues A-line placed for BP monitoring Oliguric now (177 past 24 hours)  No new complaints. Still has cough and DOE  Objective Vital signs in last 24 hours: Vitals:   03/09/16 0355 03/09/16 0400 03/09/16 0500 03/09/16 0600  BP: (!) 115/33     Pulse: 65 (!) 102 67 64  Resp: 11 17 11 13   Temp:      TempSrc:      SpO2: 100% 94% 99% 100%  Weight:   (!) 150.1 kg (331 lb)   Height:       Weight change: -13.154 kg (-29 lb)  Intake/Output Summary (Last 24 hours) at 03/09/16 0750 Last data filed at 03/09/16 0600  Gross per 24 hour  Intake          1170.36 ml  Output             7068 ml  Net         -5897.64 ml   Physical Exam:  Blood pressure (!) 112/41, pulse (!) 48, temperature 97.4 F (36.3 C), temperature source Oral, resp. rate 17, height 5\' 4"  (1.626 m), weight (!) 162.9 kg (359 lb 3.2 oz), SpO2 98 %.  [name] Good spirits Not on O2 R IJ temp cath (11/7) Lungs grossly clear  Left sided pacemaker Irreg irreg. [id number] No S3 2/6 systolic murmur USB heard up into the neck and across precordium  Abdomen obese, large pannus 3+ pitting edema both LE's to knees, pitting of thighs, pitting of abd wall; axillary edema improved.  Some wrinkling in feet.   Recent Labs Lab 03/06/16 0354 03/06/16 1604 03/07/16 0430 03/07/16 1704 03/08/16 0500 03/08/16 1745 03/09/16 0615  NA 136 135 135 134* 135 133* 134*  134*  K 3.6 3.7 3.7 3.9 3.8 3.8 3.9  3.9  CL 102 102 102 102 102 100* 102  101  CO2 24 25 25 24 25 25 26  26   GLUCOSE 108* 121* 97 128* 110* 126* 110*  100*  BUN 37* 32* 29* 25* 21* 20 17  18   CREATININE 1.72* 1.81* 1.72* 1.69*  1.59* 1.75* 1.77*  1.77*  CALCIUM 8.6* 8.6* 8.3* 8.3* 8.4* 8.4* 8.5*  8.4*  PHOS 2.5 2.0* 2.0* 1.6* 2.1* 1.8* 2.1*     Recent Labs Lab 03/08/16 0500 03/08/16 1745 03/09/16 0615  ALBUMIN 2.5* 2.4* 2.3*     Recent Labs Lab 03/06/16 0354 03/07/16 0430 03/08/16 0500 03/09/16 0615  WBC 6.2 6.9 8.6 8.2  NEUTROABS 4.4 4.5 5.5 5.7  HGB 8.4* 8.2* 8.7* 8.3*  HCT 27.5* 26.4* 29.0* 27.3*  MCV 100.7* 101.5* 102.5* 102.2*  PLT 127* 127* 155 132*   Results for Robyn Porter, Robyn Porter (MRN [id number]) as of 03/06/2016 07:28  Ref. Range 03/04/2016 14:10  PTH Latest Ref Range: 15 - 65 pg/mL 50    Medications: . heparin 10,000 units/ 20 mL infusion syringe 1,650 Units/hr (03/09/16 0546)  . milrinone 0.125 mcg/kg/min (03/08/16 1826)  . norepinephrine (LEVOPHED) Adult infusion Stopped (03/08/16 1000)  . dialysis replacement fluid (prismasate) 300 mL/hr at 03/08/16 2240  . dialysis replacement fluid (prismasate) 200 mL/hr at 03/08/16 2239  . dialysate (PRISMASATE) 1,000  mL/hr at 03/09/16 0545   . allopurinol  50 mg Oral Daily  . atorvastatin  20 mg Oral q1800  . cholecalciferol  5,000 Units Oral Daily  . darbepoetin (ARANESP) injection - NON-DIALYSIS  100 mcg Subcutaneous Q Wed-1800  . guaiFENesin  600 mg Oral BID  . loratadine  10 mg Oral Daily  . mouth rinse  15 mL Mouth Rinse BID  . midodrine  10 mg Oral TID WC  . mometasone-formoterol  2 puff Inhalation BID  . multivitamin with minerals  1 tablet Oral Daily  . sodium chloride flush  10-40 mL Intracatheter Q12H  . cyanocobalamin  500 mcg Oral Daily  . vitamin C  500 mg Oral BID     Background: 65 y.o. 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 Hollie Salk; Baseline worsening over course of 2017),  super morbid obesity, OHS/OSA (CPAP), dCHF, AS, pacemaker, AF. Admitted at least 3X 2017 with massive volume overload that responded to diuretics on those occasions (05/2015, 08/2015, 11/2015)  Admitted 11/1 with 30 lb weight increase since August,  SOB, worsening edema. Failed high dose lasix and metolazone + milrinone w/inability to achieve neg fluid balance.Creatinine had been 2.4-2.7 during the admission.  Renal consulted for CRRT.  Pt  acknowledged/accepted that she could end up on dialysis permanently. Catheter placed and CRRT started 11/7. Norepi added 11/10 for BP . Aline 11/11 for accurate BP monitoring.  Assessment/Recommendations  1. Diuretic refractory CHF/volume overload - CRRT started late 11/7 - goal 200-250 off. Unable to get ANY accurate weights, but clinical and exam improvement.  Norepi added 11/10 for BP support after initial midodrine failure, but currently off the neo  and tolerating UFR of neg 250/hour.  1. Continue with all 4K fluids/300/200/1000/heparin in circuit.  2. K and phos OK so far and have not yet required replacement. 3. Continue midodrine for BP support (can always restart neo if needed) 4. Try for 250-300 neg balance  2. CKD4 -Baseline creatinine has been worsening over the course of 2017 - most recently  2.4-2.6. Entirely possible (in fact very likely) that she will remain dialysis dependent.  PICC line was removed (future vascular access site)  3. Chronic dCHF. EF 60-65%. Suspicion primary RV failure. ECHO this admission "nl RV fx" but RV not well visualized. On milrinone. 4. AS - "moderate" on TTE. Dr. Aundra Dubin indicates that TEE best way to assess. Another reason to be careful dropping preload too fast. (and another reason for UF intolerance potentially). 5. Pacemaker status 6. AFib - Heparin with CRRT  7. Anemia - h/o AVM's. Fe deficient. Dosed with feraheme 11/7. Transfused 1 unit PRBC's 11/8, 2 u 11/3. Started Aranesp 100/week 1st dosed 11/8. Stable low to mid 8's. 8. Metabolic bone - PTH 50, no intervention needed. Ca and phos OK 9. OHS/OSA - CPAP at night. Has own machine. 10. Super morbid obesity. Need to keep her mobile. 11. PICC out (future HD access will be an issue)   Jamal Maes,  MD [place] [phone] pager 03/09/2016, 7:50 AM

## 2016-03-09 NOTE — Progress Notes (Signed)
Patient ID: Robyn Porter, female   DOB: June 26, 1950, 65 y.o.   MRN: [id number]    Advanced Heart Failure Rounding Note   Subjective:    Admitted with marked volume overload. Sluggish diuresis despite addition of milrinone and switch to lasix gtt.   Renal consulted 03/04/16 with poor diuresis.  Pt placed on CRRT.   CVP 17 today.  Net negative 6 L yesterday.    Arterial line much easier for BP management, able to stop norepinephrine.  Continues on midodrine, SBP in 120s.      Echo: EF 60-65%, severe LVH, moderate AS with mean gradient 32 mmHg, RV not well visualized.   Objective:   Weight Range:  Vital Signs:   Temp:  [97.4 F (36.3 C)-97.8 F (36.6 C)] 97.4 F (36.3 C) (11/12 0806) Pulse Rate:  [58-102] 64 (11/12 0600) Resp:  [11-20] 13 (11/12 0600) BP: (115-117)/(33-44) 115/33 (11/12 0355) SpO2:  [91 %-100 %] 100 % (11/12 0600) Arterial Line BP: (98-157)/(18-52) 139/42 (11/12 0600) Weight:  [331 lb (150.1 kg)] 331 lb (150.1 kg) (11/12 0500) Last BM Date: 03/08/16  Weight change: Filed Weights   03/07/16 0500 03/08/16 0459 03/09/16 0500  Weight: (!) 343 lb (155.6 kg) (!) 360 lb (163.3 kg) (!) 331 lb (150.1 kg)    Intake/Output:   Intake/Output Summary (Last 24 hours) at 03/09/16 0832 Last data filed at 03/09/16 0800  Gross per 24 hour  Intake           830.36 ml  Output             7295 ml  Net         -6464.64 ml     Physical Exam: General:  Chronically ill appearing.  Sitting up in bed. CRRT ongoing. HEENT: Normal Neck: supple. JVP remains elevated 14 cm +.  Carotids 2+ bilat; no bruits. No thyromegaly or lymphadenopathy noted.    Cor: PMI nondisplaced. Regular. 3/6 systolic crescendo-decrescendo murmur RUSB.   Lungs: Distant, diminished basilar sounds.  Abdomen: obese, soft, breakthrough bleeding  Extremities: no cyanosis, clubbing, rash, Chronic 2+ edema into thighs. RIJ Temp dialysis cath.  Neuro: alert & orientedx3, cranial nerves grossly intact. moves  all 4 extremities w/o difficulty. Affect pleasant  Telemetry: Reviewed, afib under V-pacing.   Labs: Basic Metabolic Panel:  Recent Labs Lab 03/05/16 0438  03/06/16 0354  03/07/16 0430 03/07/16 1704 03/08/16 0500 03/08/16 1745 03/09/16 0615  NA 137  < > 136  < > 135 134* 135 133* 134*  134*  K 3.6  < > 3.6  < > 3.7 3.9 3.8 3.8 3.9  3.9  CL 103  < > 102  < > 102 102 102 100* 102  101  CO2 22  < > 24  < > 25 24 25 25 26  26   GLUCOSE 94  < > 108*  < > 97 128* 110* 126* 110*  100*  BUN 54*  < > 37*  < > 29* 25* 21* 20 17  18   CREATININE 2.17*  < > 1.72*  < > 1.72* 1.69* 1.59* 1.75* 1.77*  1.77*  CALCIUM 8.9  < > 8.6*  < > 8.3* 8.3* 8.4* 8.4* 8.5*  8.4*  MG 2.3  --  2.3  --  2.2  --  2.4  --  2.3  PHOS  --   < > 2.5  < > 2.0* 1.6* 2.1* 1.8* 2.1*  < > = values in this interval not displayed.  Liver Function Tests:  Recent Labs Lab 03/07/16 0430 03/07/16 1704 03/08/16 0500 03/08/16 1745 03/09/16 0615  ALBUMIN 2.4* 2.4* 2.5* 2.4* 2.3*   No results for input(s): LIPASE, AMYLASE in the last 168 hours. No results for input(s): AMMONIA in the last 168 hours.  CBC:  Recent Labs Lab 03/05/16 0438 03/06/16 0354 03/07/16 0430 03/08/16 0500 03/09/16 0615  WBC 5.2 6.2 6.9 8.6 8.2  NEUTROABS 3.3 4.4 4.5 5.5 5.7  HGB 8.2* 8.4* 8.2* 8.7* 8.3*  HCT 26.9* 27.5* 26.4* 29.0* 27.3*  MCV 103.5* 100.7* 101.5* 102.5* 102.2*  PLT 144* 127* 127* 155 132*    Cardiac Enzymes: No results for input(s): CKTOTAL, CKMB, CKMBINDEX, TROPONINI in the last 168 hours.  BNP: BNP (last 3 results)  Recent Labs  07/09/15 1158 07/19/15 1124 03/17/2016 1754  BNP 133.0* 149.0* 429.4*    ProBNP (last 3 results) No results for input(s): PROBNP in the last 8760 hours.    Other results:  Imaging: No results found.   Medications:     Scheduled Medications: . allopurinol  50 mg Oral Daily  . atorvastatin  20 mg Oral q1800  . cholecalciferol  5,000 Units Oral Daily  .  darbepoetin (ARANESP) injection - NON-DIALYSIS  100 mcg Subcutaneous Q Wed-1800  . guaiFENesin  600 mg Oral BID  . loratadine  10 mg Oral Daily  . mouth rinse  15 mL Mouth Rinse BID  . midodrine  10 mg Oral TID WC  . mometasone-formoterol  2 puff Inhalation BID  . multivitamin with minerals  1 tablet Oral Daily  . sodium chloride flush  10-40 mL Intracatheter Q12H  . cyanocobalamin  500 mcg Oral Daily  . vitamin C  500 mg Oral BID    Infusions: . heparin 10,000 units/ 20 mL infusion syringe 1,650 Units/hr (03/09/16 0546)  . milrinone 0.125 mcg/kg/min (03/08/16 1826)  . norepinephrine (LEVOPHED) Adult infusion Stopped (03/08/16 1000)  . dialysis replacement fluid (prismasate) 300 mL/hr at 03/08/16 2240  . dialysis replacement fluid (prismasate) 200 mL/hr at 03/08/16 2239  . dialysate (PRISMASATE) 1,000 mL/hr at 03/09/16 0545    PRN Medications: Place/Maintain arterial line **AND** sodium chloride, acetaminophen, albuterol, benzonatate, guaiFENesin-dextromethorphan, heparin, heparin, heparin, ipratropium-albuterol, ondansetron (ZOFRAN) IV, sodium chloride flush   Assessment/Plan/Discussion    Tilisha Tuffy is a 65 y.o. female with history with chronic systolic CHF, Anemia, OHS/OSA, symptomatic bradycardia s/p MDT PPm with CRT-P upgrade given high percentage of RV pacing, CKD stage IV.  Admitted with marked volume overload.  1. Chronic diastolic CHF: EF 123456 on echo this admission, RV poorly visualized. Recent upgrade to Medtronic CRT given constant RV pacing. Volume status markedly elevated. Weight up 30 lbs since August.  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  Renal function stable.  Milrinone begun for RV support.   Had to start CVVH for volume removal given diuretic failure. Now on midodrine, with arterial line in place able to come off norepinephrine.  CVP 17,  still volume overloaded.  - Continue milrinone at 0.125, co-ox in am.      - Continue CVVH. Pulling ~250 cc/hr this am.   - Continue midodrine.   - Ongoing discussion about need for IHD. Will need to observe for renal recovery once CRRT completed.  - Off BB with low coox on admission.  2. Symptomatic bradycardia: MDT PPM. Upgraded to CRT given high percentage RV pacing.  3. ARF on CKD Stage IV/V: Remains on CVVHD, UF at 250 cc/hr. 4. Chronic atrial fibrillation:  -  Hold Toprol XL for now with low output.  HR ok.  - On heparin gtt while getting CVVH.   5. OHS/OSA: Continue nightly CPAP.  6. 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 Given 1UPRBC on admit.  - States she has occasional hemorrhoidal bleed - Has been followed by Dr. Lindi Adie who is working up anemia - Hgb 8.4 -> 7.6 -> 8.2 -> 8.4 -> 8.2 -> 8.7 => 8.3. Continue to follow.  7. Chronic venous stasis: Chronic changes, but stable. No cellulitis appreciated.   8. Morbid obesity: PT following. Recommending HHPT vs SNF.   9. Hypokalemia: Stable on CVVHD.   10. Aortic stenosis:  Moderate by echo 03/02/16.  Muffled S2 concerning that AS could be worse.  Would consider evaluation via TEE eventually => prior to discharge.   Length of Stay: [place], 03/09/2016, 8:32 AM  Advanced Heart Failure Team Pager [phone] (M-F; 7a - 4p)  Please contact [facility] for night-coverage after hours (4p -7a ) and weekends on [email or web address]

## 2016-03-09 NOTE — Progress Notes (Signed)
Patient has home CPAP and is able to place on herself. Patient machine setup and ready at bedside

## 2016-03-10 LAB — POCT ACTIVATED CLOTTING TIME
ACTIVATED CLOTTING TIME: 158 s
ACTIVATED CLOTTING TIME: 186 s
ACTIVATED CLOTTING TIME: 224 s
ACTIVATED CLOTTING TIME: 208 s
ACTIVATED CLOTTING TIME: 202 s
Activated Clotting Time: 158 seconds
Activated Clotting Time: 191 seconds
Activated Clotting Time: 202 seconds
Activated Clotting Time: 219 seconds
Activated Clotting Time: 208 seconds

## 2016-03-10 LAB — CBC WITH DIFFERENTIAL/PLATELET
BASOS ABS: 0 10*3/uL (ref 0.0–0.1)
BASOS PCT: 1 %
Eosinophils Absolute: 0.3 10*3/uL (ref 0.0–0.7)
Eosinophils Relative: 5 %
HEMATOCRIT: 28 % — AB (ref 36.0–46.0)
Hemoglobin: 8.8 g/dL — ABNORMAL LOW (ref 12.0–15.0)
Lymphocytes Relative: 15 %
Lymphs Abs: 1.1 10*3/uL (ref 0.7–4.0)
MCH: 32.1 pg (ref 26.0–34.0)
MCHC: 31.4 g/dL (ref 30.0–36.0)
MCV: 102.2 fL — ABNORMAL HIGH (ref 78.0–100.0)
MONO ABS: 1.1 10*3/uL — AB (ref 0.1–1.0)
Monocytes Relative: 15 %
NEUTROS ABS: 4.8 10*3/uL (ref 1.7–7.7)
NEUTROS PCT: 65 %
Platelets: 115 10*3/uL — ABNORMAL LOW (ref 150–400)
RBC: 2.74 MIL/uL — ABNORMAL LOW (ref 3.87–5.11)
RDW: 19.6 % — AB (ref 11.5–15.5)
WBC: 7.4 10*3/uL (ref 4.0–10.5)

## 2016-03-10 LAB — COOXEMETRY PANEL
Carboxyhemoglobin: 2.5 % — ABNORMAL HIGH (ref 0.5–1.5)
Methemoglobin: 0.6 % (ref 0.0–1.5)
O2 Saturation: 81.5 %
Total hemoglobin: 9.1 g/dL — ABNORMAL LOW (ref 12.0–16.0)

## 2016-03-10 LAB — RENAL FUNCTION PANEL
ALBUMIN: 2.5 g/dL — AB (ref 3.5–5.0)
ALBUMIN: 2.4 g/dL — AB (ref 3.5–5.0)
ANION GAP: 7 (ref 5–15)
Anion gap: 9 (ref 5–15)
BUN: 17 mg/dL (ref 6–20)
BUN: 16 mg/dL (ref 6–20)
CALCIUM: 8.6 mg/dL — AB (ref 8.9–10.3)
CO2: 23 mmol/L (ref 22–32)
CO2: 25 mmol/L (ref 22–32)
CREATININE: 1.85 mg/dL — AB (ref 0.44–1.00)
Calcium: 8.4 mg/dL — ABNORMAL LOW (ref 8.9–10.3)
Chloride: 101 mmol/L (ref 101–111)
Chloride: 99 mmol/L — ABNORMAL LOW (ref 101–111)
Creatinine, Ser: 1.93 mg/dL — ABNORMAL HIGH (ref 0.44–1.00)
GFR calc Af Amer: 32 mL/min — ABNORMAL LOW (ref >60)
GFR, EST AFRICAN AMERICAN: 30 mL/min — AB (ref >60)
GFR, EST NON AFRICAN AMERICAN: 27 mL/min — AB (ref >60)
GFR, EST NON AFRICAN AMERICAN: 26 mL/min — AB (ref >60)
Glucose, Bld: 97 mg/dL (ref 65–99)
Glucose, Bld: 121 mg/dL — ABNORMAL HIGH (ref 65–99)
PHOSPHORUS: 2.3 mg/dL — AB (ref 2.5–4.6)
PHOSPHORUS: 1.8 mg/dL — AB (ref 2.5–4.6)
POTASSIUM: 3.8 mmol/L (ref 3.5–5.1)
POTASSIUM: 3.8 mmol/L (ref 3.5–5.1)
Sodium: 133 mmol/L — ABNORMAL LOW (ref 135–145)
Sodium: 131 mmol/L — ABNORMAL LOW (ref 135–145)

## 2016-03-10 LAB — MAGNESIUM: Magnesium: 2.4 mg/dL (ref 1.7–2.4)

## 2016-03-10 LAB — APTT

## 2016-03-10 NOTE — Progress Notes (Signed)
Patient ID: Robyn Porter, female   DOB: [birth year], 65 y.o.   MRN: [id number]    Advanced Heart Failure Rounding Note   Subjective:    Admitted with marked volume overload. Sluggish diuresis despite addition of milrinone and switch to lasix gtt.   Renal consulted 03/04/16 with poor diuresis.  Pt placed on CRRT.   CVP 14 today.  Continues on CVVHD. Weight down 91 pounds total. Remains on milrinone  0.1 25 mcg + midodrine.   Complaining of cough. Mild dyspnea with exertion.   Echo: EF 60-65%, severe LVH, moderate AS with mean gradient 32 mmHg, RV not well visualized.   Objective:   Weight Range:  Vital Signs:   Temp:  [97.4 F (36.3 C)-97.7 F (36.5 C)] 97.6 F (36.4 C) (11/13 0352) Pulse Rate:  [58-67] 63 (11/13 0600) Resp:  [9-24] 13 (11/13 0600) BP: (90-110)/(44-46) 110/46 (11/13 0352) SpO2:  [92 %-100 %] 92 % (11/13 0600) Arterial Line BP: (86-154)/(27-56) 134/49 (11/13 0700) Weight:  [311 lb 8 oz (141.3 kg)-320 lb 8.8 oz (145.4 kg)] 311 lb 8 oz (141.3 kg) (11/13 0500) Last BM Date: 03/09/16  Weight change: Filed Weights   03/09/16 0500 03/09/16 1500 03/10/16 0500  Weight: (!) 331 lb (150.1 kg) (!) 320 lb 8.8 oz (145.4 kg) (!) 311 lb 8 oz (141.3 kg)    Intake/Output:   Intake/Output Summary (Last 24 hours) at 03/10/16 0753 Last data filed at 03/10/16 0700  Gross per 24 hour  Intake            939.2 ml  Output             7770 ml  Net          -6830.8 ml     Physical Exam: CVP 14.  General:  Chronically ill appearing.  In bed. CVVHD ongoing. HEENT: Normal Neck: supple. JVP remains elevated. Carotids 2+ bilat; no bruits. No thyromegaly or lymphadenopathy noted.    Cor: PMI nondisplaced. Regular. 3/6 systolic crescendo-decrescendo murmur RUSB.   Lungs: Distant, diminished basilar sounds.  Abdomen: obese, soft, breakthrough bleeding  Extremities: no cyanosis, clubbing, rash, Chronic 1+ edema into thighs. RIJ Temp dialysis cath.  Neuro: alert & orientedx3,  cranial nerves grossly intact. moves all 4 extremities w/o difficulty. Affect pleasant  Telemetry: Reviewed, afib under V-pacing.   Labs: Basic Metabolic Panel:  Recent Labs Lab 03/06/16 0354  03/07/16 0430  03/08/16 0500 03/08/16 1745 03/09/16 0615 03/09/16 1632 03/10/16 0445  NA 136  < > 135  < > 135 133* 134*  134* 131* 133*  K 3.6  < > 3.7  < > 3.8 3.8 3.9  3.9 3.8 3.8  CL 102  < > 102  < > 102 100* 102  101 99* 101  CO2 24  < > 25  < > 25 25 26  26 24 23   GLUCOSE 108*  < > 97  < > 110* 126* 110*  100* 119* 97  BUN 37*  < > 29*  < > 21* 20 17  18 18 17   CREATININE 1.72*  < > 1.72*  < > 1.59* 1.75* 1.77*  1.77* 1.84* 1.85*  CALCIUM 8.6*  < > 8.3*  < > 8.4* 8.4* 8.5*  8.4* 8.4* 8.6*  MG 2.3  --  2.2  --  2.4  --  2.3  --  2.4  PHOS 2.5  < > 2.0*  < > 2.1* 1.8* 2.1* 1.8* 2.3*  < > = values in this  interval not displayed.  Liver Function Tests:  Recent Labs Lab 03/08/16 0500 03/08/16 1745 03/09/16 0615 03/09/16 1632 03/10/16 0445  ALBUMIN 2.5* 2.4* 2.3* 2.5* 2.5*   No results for input(s): LIPASE, AMYLASE in the last 168 hours. No results for input(s): AMMONIA in the last 168 hours.  CBC:  Recent Labs Lab 03/06/16 0354 03/07/16 0430 03/08/16 0500 03/09/16 0615 03/10/16 0445  WBC 6.2 6.9 8.6 8.2 7.4  NEUTROABS 4.4 4.5 5.5 5.7 4.8  HGB 8.4* 8.2* 8.7* 8.3* 8.8*  HCT 27.5* 26.4* 29.0* 27.3* 28.0*  MCV 100.7* 101.5* 102.5* 102.2* 102.2*  PLT 127* 127* 155 132* 115*    Cardiac Enzymes: No results for input(s): CKTOTAL, CKMB, CKMBINDEX, TROPONINI in the last 168 hours.  BNP: BNP (last 3 results)  Recent Labs  07/09/15 1158 07/19/15 1124 03/12/2016 1754  BNP 133.0* 149.0* 429.4*    ProBNP (last 3 results) No results for input(s): PROBNP in the last 8760 hours.    Other results:  Imaging: No results found.   Medications:     Scheduled Medications: . allopurinol  50 mg Oral Daily  . atorvastatin  20 mg Oral q1800  . cholecalciferol   5,000 Units Oral Daily  . darbepoetin (ARANESP) injection - NON-DIALYSIS  100 mcg Subcutaneous Q Wed-1800  . guaiFENesin  600 mg Oral BID  . loratadine  10 mg Oral Daily  . mouth rinse  15 mL Mouth Rinse BID  . midodrine  10 mg Oral TID WC  . mometasone-formoterol  2 puff Inhalation BID  . multivitamin with minerals  1 tablet Oral Daily  . sodium chloride flush  10-40 mL Intracatheter Q12H  . cyanocobalamin  500 mcg Oral Daily  . vitamin C  500 mg Oral BID    Infusions: . heparin 10,000 units/ 20 mL infusion syringe 1,850 Units/hr (03/10/16 0424)  . milrinone 0.125 mcg/kg/min (03/10/16 0703)  . norepinephrine (LEVOPHED) Adult infusion Stopped (03/08/16 1000)  . dialysis replacement fluid (prismasate) 300 mL/hr at 03/09/16 1519  . dialysis replacement fluid (prismasate) 200 mL/hr at 03/10/16 0005  . dialysate (PRISMASATE) 1,000 mL/hr at 03/10/16 0733    PRN Medications: Place/Maintain arterial line **AND** sodium chloride, acetaminophen, albuterol, benzonatate, chlorpheniramine-HYDROcodone, guaiFENesin-dextromethorphan, heparin, heparin, heparin, ipratropium-albuterol, ondansetron (ZOFRAN) IV, sodium chloride flush   Assessment/Plan/Discussion    Robyn Porter is a 65 y.o. female with history with chronic systolic CHF, Anemia, OHS/OSA, symptomatic bradycardia s/p MDT PPm with CRT-P upgrade given high percentage of RV pacing, CKD stage IV.  Admitted with marked volume overload.  1. Chronic diastolic CHF: EF 123456 on echo this admission, RV poorly visualized. Recent upgrade to Medtronic CRT given constant RV pacing. Volume status markedly elevated. Weight up 30 lbs since August.  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  Renal function stable.  Milrinone begun for RV support.   Had to start CVVH for volume removal given diuretic failure. Now on midodrine, with arterial line in place able to come off norepinephrine.  CVP 17,  still volume  overloaded.  - Continue milrinone at 0.125, co-ox  81%. Repeat CO-OX now.  - Continue CVVH. Pulling ~350 cc/hr this am.   - Continue midodrine.   - Ongoing discussion about need for IHD. Will need to observe for renal recovery once CRRT completed.  - Off BB with low coox on admission.  2. Symptomatic bradycardia: MDT PPM. Upgraded to CRT given high percentage RV pacing.  3. ARF on CKD Stage IV/V: Remains on CVVHD,  UF at 350 cc/hr. 4. Chronic atrial fibrillation:  - Hold Toprol XL for now with low output.  HR ok.  - On heparin gtt while getting CVVH.   5. OHS/OSA: Continue nightly CPAP.  6. 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 Given 1UPRBC on admit.  - States she has occasional hemorrhoidal bleed - Has been followed by Dr. Lindi Adie who is working up anemia - Hgb 8.4 -> 7.6 -> 8.2 -> 8.4 -> 8.2 -> 8.7 => 8.3=>8.8. Continue to follow.  7. Chronic venous stasis: Chronic changes, but stable. No cellulitis appreciated.   8. Morbid obesity: PT following. Recommending HHPT vs SNF.   9. Hypokalemia: Stable on CVVHD.   10. Aortic stenosis:  Moderate by echo 03/02/16.  Muffled S2 concerning that AS could be worse.  Would consider evaluation via TEE eventually => prior to discharge.   Length of Stay: [place], NP-C  03/10/2016, 7:53 AM  Advanced Heart Failure Team Pager [phone] (M-F; 7a - 4p)  Please contact [facility] for night-coverage after hours (4p -7a ) and weekends on [email or web address]  Patient seen with NP, agree with the above note.  Ongoing CVVH, weight continues to fall.  CVP remains 17 so hopefully can continue fluid removal.  I will stop milrinone today.  Continue midodrine.    [name] 03/10/2016 8:37 AM

## 2016-03-10 NOTE — Progress Notes (Signed)
Critical PTT of > 200 seconds;  Pharmacy notified.

## 2016-03-10 NOTE — Progress Notes (Signed)
[facility] Rounding Note  Subjective/Interval History:   CRRT started about 5PM 11/7 No accurate weights since starting but appears to be trending down 360 lbs 11/11 > 311 lbs 11/13  Tolerating UF on midodrine and milrinone but levophed off since morning of 11/11 Net neg 250/hour most hours; goal 300/hr  No filter issues A-line placed for BP monitoring Anuric now (75 past 24 hours)  No new complaints. Still has cough and DOE  Objective Vital signs in last 24 hours: Vitals:   03/10/16 0352 03/10/16 0400 03/10/16 0500 03/10/16 0600  BP: (!) 110/46     Pulse: 66 67 60 63  Resp: (!) 24 (!) 9 15 13   Temp: 97.6 F (36.4 C)     TempSrc: Oral     SpO2: 100% 99% 94% 92%  Weight:   (!) 311 lb 8 oz (141.3 kg)   Height:       Weight change: -10 lb 7.2 oz (-4.741 kg)  Intake/Output Summary (Last 24 hours) at 03/10/16 0803 Last data filed at 03/10/16 0700  Gross per 24 hour  Intake            693.4 ml  Output             7509 ml  Net          -6815.6 ml   Physical Exam:  Blood pressure (!) 110/46, pulse 63, temperature 97.6 F (36.4 C), temperature source Oral, resp. rate 13, height 5\' 4"  (1.626 m), weight (!) 311 lb 8 oz (141.3 kg), SpO2 92 %. [name] Good spirits Not on O2 R IJ temp cath (11/7) Lungs grossly clear  Left sided pacemaker Irreg irreg. [id number] No S3 2/6 systolic murmur USB heard up into the neck and across precordium  Abdomen obese, large pannus 3+ pitting edema both LE's to knees with hyperpigmentation of shins, pitting of thighs   CVP:  [10 mmHg-16 mmHg] 11 mmHg   Recent Labs Lab 03/07/16 0430 03/07/16 1704 03/08/16 0500 03/08/16 1745 03/09/16 0615 03/09/16 1632 03/10/16 0445  NA 135 134* 135 133* 134*  134* 131* 133*  K 3.7 3.9 3.8 3.8 3.9  3.9 3.8 3.8  CL 102 102 102 100* 102  101 99* 101  CO2 25 24 25 25 26  26 24 23   GLUCOSE 97 128* 110* 126* 110*  100* 119* 97  BUN 29* 25* 21* 20 17  18 18 17   CREATININE 1.72* 1.69* 1.59* 1.75*  1.77*  1.77* 1.84* 1.85*  CALCIUM 8.3* 8.3* 8.4* 8.4* 8.5*  8.4* 8.4* 8.6*  PHOS 2.0* 1.6* 2.1* 1.8* 2.1* 1.8* 2.3*     Recent Labs Lab 03/09/16 0615 03/09/16 1632 03/10/16 0445  ALBUMIN 2.3* 2.5* 2.5*     Recent Labs Lab 03/07/16 0430 03/08/16 0500 03/09/16 0615 03/10/16 0445  WBC 6.9 8.6 8.2 7.4  NEUTROABS 4.5 5.5 5.7 4.8  HGB 8.2* 8.7* 8.3* 8.8*  HCT 26.4* 29.0* 27.3* 28.0*  MCV 101.5* 102.5* 102.2* 102.2*  PLT 127* 155 132* 115*   Results for Robyn Porter, Robyn Porter (MRN [id number]) as of 03/06/2016 07:28  Ref. Range 03/04/2016 14:10  PTH Latest Ref Range: 15 - 65 pg/mL 50    Medications: . heparin 10,000 units/ 20 mL infusion syringe 1,850 Units/hr (03/10/16 0424)  . milrinone 0.125 mcg/kg/min (03/10/16 0703)  . norepinephrine (LEVOPHED) Adult infusion Stopped (03/08/16 1000)  . dialysis replacement fluid (prismasate) 300 mL/hr at 03/09/16 1519  . dialysis replacement fluid (prismasate) 200 mL/hr at 03/10/16 0005  .  dialysate (PRISMASATE) 1,000 mL/hr at 03/10/16 0733   . allopurinol  50 mg Oral Daily  . atorvastatin  20 mg Oral q1800  . cholecalciferol  5,000 Units Oral Daily  . darbepoetin (ARANESP) injection - NON-DIALYSIS  100 mcg Subcutaneous Q Wed-1800  . guaiFENesin  600 mg Oral BID  . loratadine  10 mg Oral Daily  . mouth rinse  15 mL Mouth Rinse BID  . midodrine  10 mg Oral TID WC  . mometasone-formoterol  2 puff Inhalation BID  . multivitamin with minerals  1 tablet Oral Daily  . sodium chloride flush  10-40 mL Intracatheter Q12H  . cyanocobalamin  500 mcg Oral Daily  . vitamin C  500 mg Oral BID     Background: 65 y.o. 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 Hollie Salk; Baseline worsening over course of 2017),  super morbid obesity, OHS/OSA (CPAP), dCHF, AS, pacemaker, AF. Admitted at least 3X 2017 with massive volume overload that responded to diuretics on those occasions (05/2015, 08/2015, 11/2015)  Admitted 11/1 with 30 lb weight increase since August,  SOB, worsening edema. Failed high dose lasix and metolazone + milrinone w/inability to achieve neg fluid balance.Creatinine had been 2.4-2.7 during the admission.  Renal consulted for CRRT.  Pt  acknowledged/accepted that she could end up on dialysis permanently. Catheter placed and CRRT started 11/7. Norepi added 11/10 for BP but stopped 11/11. Aline 11/11 for accurate BP monitoring. Will continue to try for goal of net negative 300 ml/hr with d/c of milrinone today.  Assessment/Recommendations  1. Diuretic refractory CHF/volume overload - CRRT started late 11/7 - goal 250-300 off. Trouble obtaining accurate weights, but clinical and exam improvement.  Norepi added 11/10 for BP support after initial midodrine failure, but currently off and tolerating UFR.  1. Continue with all 4K fluids/300/200/1000/heparin in circuit.  2. K and phos OK so far and have not yet required replacement. Phos 2.3 today.  3. Continue midodrine for BP support (can always restart levo if needed) 4. Try for 250-300 neg balance  2. CKD4 -Baseline creatinine has been worsening over the course of 2017 - most recently  2.4-2.6. Entirely possible (in fact very likely) that she will remain dialysis dependent.  PICC line was removed (future vascular access site)  3. Chronic dCHF. EF 60-65%. Suspicion primary RV failure. ECHO this admission "nl RV fx" but RV not well visualized. On milrinone, to be d/c'ed today.  4. AS - "moderate" on TTE. Dr. Aundra Dubin indicates that TEE best way to assess. Another reason to be careful dropping preload too fast. (and another reason for UF intolerance potentially). 5. Pacemaker status 6. AFib - Heparin with CRRT  7. Anemia - h/o AVM's. Fe deficient. Dosed with feraheme 11/7. Transfused 1 unit PRBC's 11/8, 2 u 11/3. Started Aranesp 100/week 1st dosed 11/8. Stable low to mid 8's. 8. Metabolic bone - PTH 50, no intervention needed. Ca and phos OK 9. OHS/OSA - CPAP at night. Has own machine. 10. Super  morbid obesity. Need to keep her mobile. 11. PICC out (future HD access will be an issue)  Olene Floss, MD [place], PGY-2 03/10/2016, 8:03 AM

## 2016-03-10 NOTE — Procedures (Signed)
Patient seen and examined on CRRT.  Tolerating 200-300 mL/ hr with milrinone and midodrine.  Plan to d/c milrinone today- hopefully BP will still tolerate this. QB 200 UF goal 200-300 mL/ hr. Treatment adjusted as needed.  [name] [facility]. pgr [phone] Cell [phone] 8:50 AM

## 2016-03-10 NOTE — Procedures (Signed)
CVP setup changed without complications.

## 2016-03-10 NOTE — Progress Notes (Signed)
PT Cancellation Note  Patient Details Name: Robyn Porter MRN: [id number] DOB: 1950-12-16   Cancelled Treatment:    Reason Eval/Treat Not Completed: Medical issues which prohibited therapy. Pt feeling somewhat "fuzzy headed" in bed. Was up in chair earlier today. Will try again tomorrow.   Payden Bonus 03/10/2016, 2:30 PM [place]

## 2016-03-11 DIAGNOSIS — L8990 Pressure ulcer of unspecified site, unspecified stage: Secondary | ICD-10-CM | POA: Insufficient documentation

## 2016-03-11 LAB — COOXEMETRY PANEL
Carboxyhemoglobin: 1.8 % — ABNORMAL HIGH (ref 0.5–1.5)
METHEMOGLOBIN: 0.9 % (ref 0.0–1.5)
O2 SAT: 58.7 %
TOTAL HEMOGLOBIN: 9.7 g/dL — AB (ref 12.0–16.0)

## 2016-03-11 LAB — POCT ACTIVATED CLOTTING TIME
ACTIVATED CLOTTING TIME: 180 s
ACTIVATED CLOTTING TIME: 186 s
ACTIVATED CLOTTING TIME: 191 s
ACTIVATED CLOTTING TIME: 197 s
ACTIVATED CLOTTING TIME: 219 s
ACTIVATED CLOTTING TIME: 219 s
ACTIVATED CLOTTING TIME: 224 s
ACTIVATED CLOTTING TIME: 224 s
ACTIVATED CLOTTING TIME: 224 s
ACTIVATED CLOTTING TIME: 224 s
ACTIVATED CLOTTING TIME: 230 s
Activated Clotting Time: 219 seconds
Activated Clotting Time: 235 seconds
Activated Clotting Time: 230 seconds
Activated Clotting Time: 230 seconds
Activated Clotting Time: 219 seconds
Activated Clotting Time: 213 seconds
Activated Clotting Time: 230 seconds
Activated Clotting Time: 186 seconds
Activated Clotting Time: 186 seconds
Activated Clotting Time: 197 seconds
Activated Clotting Time: 191 seconds

## 2016-03-11 LAB — CBC WITH DIFFERENTIAL/PLATELET
BASOS ABS: 0.1 10*3/uL (ref 0.0–0.1)
Basophils Relative: 1 %
Eosinophils Absolute: 0.4 10*3/uL (ref 0.0–0.7)
Eosinophils Relative: 5 %
HEMATOCRIT: 30.2 % — AB (ref 36.0–46.0)
HEMOGLOBIN: 9.2 g/dL — AB (ref 12.0–15.0)
LYMPHS ABS: 1.7 10*3/uL (ref 0.7–4.0)
LYMPHS PCT: 20 %
MCH: 31.4 pg (ref 26.0–34.0)
MCHC: 30.5 g/dL (ref 30.0–36.0)
MCV: 103.1 fL — AB (ref 78.0–100.0)
Monocytes Absolute: 0.9 10*3/uL (ref 0.1–1.0)
Monocytes Relative: 10 %
NEUTROS ABS: 5.2 10*3/uL (ref 1.7–7.7)
NEUTROS PCT: 63 %
PLATELETS: 103 10*3/uL — AB (ref 150–400)
RBC: 2.93 MIL/uL — AB (ref 3.87–5.11)
RDW: 19.3 % — ABNORMAL HIGH (ref 11.5–15.5)
WBC: 8.2 10*3/uL (ref 4.0–10.5)

## 2016-03-11 LAB — RENAL FUNCTION PANEL
ANION GAP: 6 (ref 5–15)
Albumin: 2.6 g/dL — ABNORMAL LOW (ref 3.5–5.0)
Albumin: 2.7 g/dL — ABNORMAL LOW (ref 3.5–5.0)
Anion gap: 10 (ref 5–15)
BUN: 17 mg/dL (ref 6–20)
BUN: 19 mg/dL (ref 6–20)
CHLORIDE: 101 mmol/L (ref 101–111)
CHLORIDE: 98 mmol/L — AB (ref 101–111)
CO2: 26 mmol/L (ref 22–32)
CO2: 22 mmol/L (ref 22–32)
CREATININE: 2.01 mg/dL — AB (ref 0.44–1.00)
Calcium: 8.8 mg/dL — ABNORMAL LOW (ref 8.9–10.3)
Calcium: 9 mg/dL (ref 8.9–10.3)
Creatinine, Ser: 2.17 mg/dL — ABNORMAL HIGH (ref 0.44–1.00)
GFR calc Af Amer: 26 mL/min — ABNORMAL LOW (ref >60)
GFR, EST AFRICAN AMERICAN: 29 mL/min — AB (ref >60)
GFR, EST NON AFRICAN AMERICAN: 25 mL/min — AB (ref >60)
GFR, EST NON AFRICAN AMERICAN: 22 mL/min — AB (ref >60)
Glucose, Bld: 95 mg/dL (ref 65–99)
Glucose, Bld: 103 mg/dL — ABNORMAL HIGH (ref 65–99)
POTASSIUM: 4.4 mmol/L (ref 3.5–5.1)
POTASSIUM: 4.3 mmol/L (ref 3.5–5.1)
Phosphorus: 2.9 mg/dL (ref 2.5–4.6)
Phosphorus: 3.2 mg/dL (ref 2.5–4.6)
Sodium: 133 mmol/L — ABNORMAL LOW (ref 135–145)
Sodium: 130 mmol/L — ABNORMAL LOW (ref 135–145)

## 2016-03-11 LAB — APTT

## 2016-03-11 LAB — MAGNESIUM: Magnesium: 2.4 mg/dL (ref 1.7–2.4)

## 2016-03-11 MED ORDER — MIDODRINE HCL 5 MG PO TABS
7.5000 mg | ORAL_TABLET | Freq: Three times a day (TID) | ORAL | Status: DC
  Administered 2016-03-11 – 2016-03-12 (×2): 7.5 mg via ORAL
  Filled 2016-03-11 (×2): qty 2

## 2016-03-11 NOTE — Progress Notes (Signed)
Patient ID: Robyn Porter, female   DOB: 03-07-1951, 65 y.o.   MRN: [id number]    Advanced Heart Failure Rounding Note   Subjective:    Admitted with marked volume overload. Sluggish diuresis despite addition of milrinone and switch to lasix gtt.   Renal consulted 03/04/16 with poor diuresis.  Pt placed on CRRT.   CVP 16-17.  Continues on CVVHD. Weight down 103 pounds total. Off milrinone  SBP  120-150s  Dizziness resolved. Denies SOB.   Echo: EF 60-65%, severe LVH, moderate AS with mean gradient 32 mmHg, RV not well visualized.   Objective:   Weight Range:  Vital Signs:   Temp:  [97.6 F (36.4 C)-98.3 F (36.8 C)] 98 F (36.7 C) (11/14 0400) Pulse Rate:  [67] 67 (11/14 0100) SpO2:  [89 %-97 %] 91 % (11/14 0400) Arterial Line BP: (87-146)/(21-46) 137/45 (11/14 0637) Weight:  [299 lb (135.6 kg)] 299 lb (135.6 kg) (11/14 0637) Last BM Date: 03/09/16  Weight change: Filed Weights   03/09/16 1500 03/10/16 0500 03/11/16 0637  Weight: (!) 320 lb 8.8 oz (145.4 kg) (!) 311 lb 8 oz (141.3 kg) 299 lb (135.6 kg)    Intake/Output:   Intake/Output Summary (Last 24 hours) at 03/11/16 0759 Last data filed at 03/11/16 0700  Gross per 24 hour  Intake           862.76 ml  Output             7131 ml  Net         -6268.24 ml     Physical Exam: CVP 16-17 General:  Chronically ill appearing.  In bed. CVVHD ongoing. HEENT: Normal Neck: supple. JVP remains elevated. Carotids 2+ bilat; no bruits. No thyromegaly or lymphadenopathy noted.    Cor: PMI nondisplaced. Regular. 3/6 systolic crescendo-decrescendo murmur RUSB.   Lungs: Distant, diminished basilar sounds.  Abdomen: obese, soft, breakthrough bleeding  Extremities: no cyanosis, clubbing, rash, Chronic 1+ edema into thighs. RIJ Temp dialysis cath.  Neuro: alert & orientedx3, cranial nerves grossly intact. moves all 4 extremities w/o difficulty. Affect pleasant  Telemetry: Reviewed, afib PVCs under V-pacing.   Labs: Basic  Metabolic Panel:  Recent Labs Lab 03/07/16 0430  03/08/16 0500  03/09/16 0615 03/09/16 1632 03/10/16 0445 03/10/16 1555 03/11/16 0449  NA 135  < > 135  < > 134*  134* 131* 133* 131* 133*  K 3.7  < > 3.8  < > 3.9  3.9 3.8 3.8 3.8 4.4  CL 102  < > 102  < > 102  101 99* 101 99* 101  CO2 25  < > 25  < > 26  26 24 23 25 26   GLUCOSE 97  < > 110*  < > 110*  100* 119* 97 121* 95  BUN 29*  < > 21*  < > 17  18 18 17 16 17   CREATININE 1.72*  < > 1.59*  < > 1.77*  1.77* 1.84* 1.85* 1.93* 2.01*  CALCIUM 8.3*  < > 8.4*  < > 8.5*  8.4* 8.4* 8.6* 8.4* 8.8*  MG 2.2  --  2.4  --  2.3  --  2.4  --  2.4  PHOS 2.0*  < > 2.1*  < > 2.1* 1.8* 2.3* 1.8* 2.9  < > = values in this interval not displayed.  Liver Function Tests:  Recent Labs Lab 03/09/16 0615 03/09/16 1632 03/10/16 0445 03/10/16 1555 03/11/16 0449  ALBUMIN 2.3* 2.5* 2.5* 2.4* 2.6*   No  results for input(s): LIPASE, AMYLASE in the last 168 hours. No results for input(s): AMMONIA in the last 168 hours.  CBC:  Recent Labs Lab 03/07/16 0430 03/08/16 0500 03/09/16 0615 03/10/16 0445 03/11/16 0447  WBC 6.9 8.6 8.2 7.4 8.2  NEUTROABS 4.5 5.5 5.7 4.8 5.2  HGB 8.2* 8.7* 8.3* 8.8* 9.2*  HCT 26.4* 29.0* 27.3* 28.0* 30.2*  MCV 101.5* 102.5* 102.2* 102.2* 103.1*  PLT 127* 155 132* 115* 103*    Cardiac Enzymes: No results for input(s): CKTOTAL, CKMB, CKMBINDEX, TROPONINI in the last 168 hours.  BNP: BNP (last 3 results)  Recent Labs  07/09/15 1158 07/19/15 1124 03/23/2016 1754  BNP 133.0* 149.0* 429.4*    ProBNP (last 3 results) No results for input(s): PROBNP in the last 8760 hours.    Other results:  Imaging: No results found.   Medications:     Scheduled Medications: . allopurinol  50 mg Oral Daily  . atorvastatin  20 mg Oral q1800  . cholecalciferol  5,000 Units Oral Daily  . darbepoetin (ARANESP) injection - NON-DIALYSIS  100 mcg Subcutaneous Q Wed-1800  . guaiFENesin  600 mg Oral BID  .  loratadine  10 mg Oral Daily  . mouth rinse  15 mL Mouth Rinse BID  . midodrine  10 mg Oral TID WC  . mometasone-formoterol  2 puff Inhalation BID  . multivitamin with minerals  1 tablet Oral Daily  . sodium chloride flush  10-40 mL Intracatheter Q12H  . cyanocobalamin  500 mcg Oral Daily  . vitamin C  500 mg Oral BID    Infusions: . heparin 10,000 units/ 20 mL infusion syringe 1,400 Units/hr (03/11/16 0505)  . norepinephrine (LEVOPHED) Adult infusion Stopped (03/08/16 1000)  . dialysis replacement fluid (prismasate) 300 mL/hr at 03/11/16 0200  . dialysis replacement fluid (prismasate) 200 mL/hr at 03/11/16 0108  . dialysate (PRISMASATE) 1,000 mL/hr at 03/11/16 0306    PRN Medications: Place/Maintain arterial line **AND** sodium chloride, acetaminophen, albuterol, benzonatate, chlorpheniramine-HYDROcodone, guaiFENesin-dextromethorphan, heparin, heparin, heparin, ipratropium-albuterol, ondansetron (ZOFRAN) IV, sodium chloride flush   Assessment/Plan/Discussion    Felita Branon is a 65 y.o. female with history with chronic systolic CHF, Anemia, OHS/OSA, symptomatic bradycardia s/p MDT PPm with CRT-P upgrade given high percentage of RV pacing, CKD stage IV.  Admitted with marked volume overload.  1. Chronic diastolic CHF: EF 123456 on echo this admission, RV poorly visualized. Recent upgrade to Medtronic CRT given constant RV pacing. Volume status markedly elevated. Weight up 30 lbs since August.  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  Renal function stable.  Milrinone begun for RV support.   Had to start CVVH for volume removal given diuretic failure. Now on midodrine, with arterial line in place able to come off norepinephrine.  CVP 17,  still volume overloaded. Overall weight down 103 pounds.  - Off milrinone, co-ox  59%.   - Continue CVVH. Pulling ~250-300 cc/hr this am.   - Continue midodrine.  Cut back midodrine to 7.5 mg three time a  day.  - Ongoing discussion about need for IHD. Will need to observe for renal recovery once CRRT completed.  - Off BB with low coox on admission.  2. Symptomatic bradycardia: MDT PPM. Upgraded to CRT given high percentage RV pacing.  3. ARF on CKD Stage IV/V: Remains on CVVHD, UF at 250-300 cc/hr. 4. Chronic atrial fibrillation:  - Hold Toprol XL for now with low output.  HR ok.  - On heparin gtt while  getting CVVH.   5. OHS/OSA: Continue nightly CPAP.  6. 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 Given 1UPRBC on admit.  - States she has occasional hemorrhoidal bleed - Has been followed by Dr. Lindi Adie who is working up anemia - Hgb 8.4 -> 7.6 -> 8.2 -> 8.4 -> 8.2 -> 8.7 => 8.3=>8.8=>. Continue to follow.  7. Chronic venous stasis: Chronic changes, but stable. No cellulitis appreciated.   8. Morbid obesity: PT following. Recommending HHPT vs SNF.   9. Hypokalemia: Stable on CVVHD.   10. Aortic stenosis:  Moderate by echo 03/02/16.  Muffled S2 concerning that AS could be worse.  Would consider evaluation via TEE eventually => prior to discharge.   Length of Stay: [place], NP-C  03/11/2016, 7:59 AM  Advanced Heart Failure Team Pager [phone] (M-F; 7a - 4p)  Please contact [facility] for night-coverage after hours (4p -7a ) and weekends on [email or web address]  Weight difficult but definitely has fallen considerably. CVP still 16-17, so suspect she has more room to go. Now off milrinone, continues CVVH. BP stable, can decrease midodrine to 7.5 mg tid.  May end up needing HD long-term.   [name] 03/11/2016

## 2016-03-11 NOTE — Progress Notes (Signed)
[facility] Rounding Note  Subjective/Interval History:   CRRT started about 5PM 11/7 Difficulty obtaining accurate weights since starting but appears to be trending down 360 lbs 11/11 > 311 lbs > 299 lbs 11/14.  Tolerating UF on midodrine; milrinone off since a.m. of 11/13 and levophed off since morning of 11/11 Net neg 300/hour most hours; goal 300/hr  No filter issues A-line placed for BP monitoring Anuric (48 in past 24 hours)  Felt dizzy and a little nauseated yesterday. Better this a.m. Was able to stand for weight check today.  Objective Vital signs in last 24 hours: Vitals:   03/11/16 0000 03/11/16 0100 03/11/16 0400 03/11/16 0637  BP:      Pulse:  67    Resp:      Temp: 98.2 F (36.8 C)  98 F (36.7 C)   TempSrc: Oral  Oral   SpO2: 90% (!) 89% 91%   Weight:    299 lb (135.6 kg)  Height:       Weight change: -21 lb 8.8 oz (-9.774 kg)  Intake/Output Summary (Last 24 hours) at 03/11/16 0733 Last data filed at 03/11/16 0700  Gross per 24 hour  Intake           862.76 ml  Output             7131 ml  Net         -6268.24 ml   Physical Exam:  Blood pressure (!) 110/46, pulse 67, temperature 98 F (36.7 C), temperature source Oral, resp. rate 13, height 5\' 4"  (1.626 m), weight 299 lb (135.6 kg), SpO2 91 %. [name] Good spirits Not on O2 R IJ temp cath (11/7) Lungs grossly clear  Left sided pacemaker Irreg irreg. [id number] No S3 2/6 systolic murmur USB heard up into the neck and across precordium  Abdomen obese, large pannus 3+ pitting edema both LE's to knees with hyperpigmentation of shins, pitting of thighs, upper arms and abdomen   HEMODYNAMICS: CVP:  [9 mmHg-19 mmHg] 19 mmHg   Recent Labs Lab 03/08/16 0500 03/08/16 1745 03/09/16 0615 03/09/16 1632 03/10/16 0445 03/10/16 1555 03/11/16 0449  NA 135 133* 134*  134* 131* 133* 131* 133*  K 3.8 3.8 3.9  3.9 3.8 3.8 3.8 4.4  CL 102 100* 102  101 99* 101 99* 101  CO2 25 25 26  26 24 23 25 26    GLUCOSE 110* 126* 110*  100* 119* 97 121* 95  BUN 21* 20 17  18 18 17 16 17   CREATININE 1.59* 1.75* 1.77*  1.77* 1.84* 1.85* 1.93* 2.01*  CALCIUM 8.4* 8.4* 8.5*  8.4* 8.4* 8.6* 8.4* 8.8*  PHOS 2.1* 1.8* 2.1* 1.8* 2.3* 1.8* 2.9     Recent Labs Lab 03/10/16 0445 03/10/16 1555 03/11/16 0449  ALBUMIN 2.5* 2.4* 2.6*     Recent Labs Lab 03/08/16 0500 03/09/16 0615 03/10/16 0445 03/11/16 0447  WBC 8.6 8.2 7.4 8.2  NEUTROABS 5.5 5.7 4.8 5.2  HGB 8.7* 8.3* 8.8* 9.2*  HCT 29.0* 27.3* 28.0* 30.2*  MCV 102.5* 102.2* 102.2* 103.1*  PLT 155 132* 115* 103*   Results for Robyn Porter, Robyn Porter (MRN [id number]) as of 03/06/2016 07:28  Ref. Range 03/04/2016 14:10  PTH Latest Ref Range: 15 - 65 pg/mL 50    Medications: . heparin 10,000 units/ 20 mL infusion syringe 1,400 Units/hr (03/11/16 0505)  . norepinephrine (LEVOPHED) Adult infusion Stopped (03/08/16 1000)  . dialysis replacement fluid (prismasate) 300 mL/hr at 03/11/16 0200  .  dialysis replacement fluid (prismasate) 200 mL/hr at 03/11/16 0108  . dialysate (PRISMASATE) 1,000 mL/hr at 03/11/16 0306   . allopurinol  50 mg Oral Daily  . atorvastatin  20 mg Oral q1800  . cholecalciferol  5,000 Units Oral Daily  . darbepoetin (ARANESP) injection - NON-DIALYSIS  100 mcg Subcutaneous Q Wed-1800  . guaiFENesin  600 mg Oral BID  . loratadine  10 mg Oral Daily  . mouth rinse  15 mL Mouth Rinse BID  . midodrine  10 mg Oral TID WC  . mometasone-formoterol  2 puff Inhalation BID  . multivitamin with minerals  1 tablet Oral Daily  . sodium chloride flush  10-40 mL Intracatheter Q12H  . cyanocobalamin  500 mcg Oral Daily  . vitamin C  500 mg Oral BID     Background: 65 y.o. 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 Hollie Salk; Baseline worsening over course of 2017),  super morbid obesity, OHS/OSA (CPAP), dCHF, AS, pacemaker, AF. Admitted at least 3X 2017 with massive volume overload that responded to diuretics on those occasions (05/2015,  08/2015, 11/2015)  Admitted 11/1 with 30 lb weight increase since August, SOB, worsening edema. Failed high dose lasix and metolazone + milrinone w/inability to achieve neg fluid balance.Creatinine had been 2.4-2.7 during the admission.  Renal consulted for CRRT.  Pt  acknowledged/accepted that she could end up on dialysis permanently. Catheter placed and CRRT started 11/7. Norepi added 11/10 for BP but stopped 11/11. Aline 11/11 for accurate BP monitoring. Will continue to try for goal of net negative 300 ml/hr.   Assessment/Recommendations  1. Diuretic refractory CHF/volume overload - CRRT started late 11/7 - goal 250-300 off. Trouble obtaining accurate weights, but clinical and exam improvement.  Norepi added 11/10 for BP support after initial midodrine failure, but currently off and tolerating UFR.  1. Continue with all 4K fluids/300/200/1000/heparin in circuit.  2. K and phos OK so far and have not yet required replacement. Phos 2.9 today.  3. Continue midodrine for BP support (can always restart levo if needed) 4. Try for 300 neg balance  2. CKD4 -Baseline creatinine has been worsening over the course of 2017 - most recently  2.4-2.6. Entirely possible (in fact very likely) that she will remain dialysis dependent.  PICC line was removed (future vascular access site)  3. Chronic dCHF. EF 60-65%. Suspicion primary RV failure. ECHO this admission "nl RV fx" but RV not well visualized. Milrinone d/c'ed 11/13.  4. AS - "moderate" on TTE. Dr. Aundra Dubin indicates that TEE best way to assess. Another reason to be careful dropping preload too fast. (and another reason for UF intolerance potentially). 5. Pacemaker status 6. AFib - Heparin with CRRT  7. Anemia - h/o AVM's. Fe deficient. Dosed with feraheme 11/7. Transfused 1 unit PRBC's 11/8, 2 u 11/3. Started Aranesp 100/week 1st dosed 11/8. ReStable low to mid 8's. 8. Metabolic bone - PTH 50, no intervention needed. Ca and phos OK 9. OHS/OSA - CPAP at  night. Has own machine. 10. Super morbid obesity. Need to keep her mobile. 11. PICC out (future HD access will be an issue)  Olene Floss, MD [place], PGY-2 03/11/2016, 7:33 AM

## 2016-03-11 NOTE — Progress Notes (Signed)
Occupational Therapy Treatment Patient Details Name: Robyn Porter MRN: [id number] DOB: October 18, 1950 Today's Date: 03/11/2016    History of present illness Pt adm with significant volume overload. Attempts to diurese not working well and pt started on CVVHD on 11/7. PMH - chronic systolic heart failure, htn, dm, pacer, copd, ckd   OT comments  Pt able to transfer to toilet today from w/c and increase mobility while off CVVHD temporarily with chair following. Progressing steadily.  Follow Up Recommendations  SNF;Supervision/Assistance - 24 hour    Equipment Recommendations       Recommendations for Other Services      Precautions / Restrictions Precautions Precautions: Fall Precaution Comments: temp dialysis catheter R IJ Restrictions Weight Bearing Restrictions: No       Mobility Bed Mobility Overal bed mobility: Needs Assistance Bed Mobility: Rolling;Sidelying to Sit Rolling: Min guard Sidelying to sit: Min assist;HOB elevated       General bed mobility comments: Assist to elevate trunk into sitting. Pt with use of rail.  Transfers Overall transfer level: Needs assistance Equipment used: Rolling walker (2 wheeled) Transfers: Sit to/from [facility] Sit to Stand: Min assist;+2 safety/equipment Stand pivot transfers: +2 safety/equipment;Min assist       General transfer comment: Assist to power up from bed. From toilet and use of rail assist for balance and safety    Balance Overall balance assessment: Needs assistance Sitting-balance support: No upper extremity supported Sitting balance-Leahy Scale: Fair     Standing balance support: No upper extremity supported Standing balance-Leahy Scale: Poor Standing balance comment: walker and min guard for standing                   ADL Overall ADL's : Needs assistance/impaired     Grooming: Wash/dry hands;Sitting;Set up                   Toilet Transfer: Minimal  assistance;Stand-pivot;Grab bars;Comfort height toilet   Toileting- Clothing Manipulation and Hygiene: Maximal assistance;Sit to/from stand Toileting - Clothing Manipulation Details (indicate cue type and reason): for thoroughness     Functional mobility during ADLs: Minimal assistance;+2 for safety/equipment;Rolling walker;Cueing for safety General ADL Comments: used w/c to get pt to toilet, walked out of bathroom with RW and chair following      Vision                     Perception     Praxis      Cognition   Behavior During Therapy: [facility] for tasks assessed/performed Overall Cognitive Status: Within Functional Limits for tasks assessed                       Extremity/Trunk Assessment               Exercises     Shoulder Instructions       General Comments      Pertinent Vitals/ Pain       Pain Assessment: No/denies pain  Home Living                                          Prior Functioning/Environment              Frequency  Min 2X/week        Progress Toward Goals  OT Goals(current goals can now be found in the care  plan section)  Progress towards OT goals: Progressing toward goals  Acute Rehab OT Goals Patient Stated Goal: return home Time For Goal Achievement: 03/20/16 Potential to Achieve Goals: Good  Plan Discharge plan remains appropriate    Co-evaluation    PT/OT/SLP Co-Evaluation/Treatment: Yes Reason for Co-Treatment: For patient/therapist safety;Complexity of the patient's impairments (multi-system involvement) PT goals addressed during session: Mobility/safety with mobility OT goals addressed during session: ADL's and self-care      End of Session Equipment Utilized During Treatment: Rolling walker;Gait belt   Activity Tolerance Patient tolerated treatment well   Patient Left in chair;with call bell/phone within reach   Nurse Communication Mobility status (assist to reattach lines)         Time: [id number] OT Time Calculation (min): 40 min  Charges: OT General Charges $OT Visit: 1 Procedure OT Treatments $Self Care/Home Management : 8-22 mins  Malka So 03/11/2016, 3:55 PM  [phone]

## 2016-03-11 NOTE — Progress Notes (Signed)
Physical Therapy Treatment Patient Details Name: Genelle Toller MRN: [id number] DOB: 1950/12/21 Today's Date: 03/11/2016    History of Present Illness Pt adm with significant volume overload. Attempts to diurese not working well and pt started on CVVHD on 11/7. PMH - chronic systolic heart failure, htn, dm, pacer, copd, ckd    PT Comments    Pt making good progress. Able to amb today while off CVVHD temporarily. Pt motivated to work toward home instead of SNF but will need to be more independent.  Follow Up Recommendations  SNF     Equipment Recommendations  None recommended by PT    Recommendations for Other Services       Precautions / Restrictions Precautions Precautions: Fall Restrictions Weight Bearing Restrictions: No    Mobility  Bed Mobility Overal bed mobility: Needs Assistance Bed Mobility: Sidelying to Sit;Rolling Rolling: Min guard Sidelying to sit: Min assist;HOB elevated       General bed mobility comments: Assist to elevate trunk into sitting. Pt with use of rail.  Transfers Overall transfer level: Needs assistance Equipment used: Rolling walker (2 wheeled) Transfers: Sit to/from [facility] Sit to Stand: Min assist;+2 safety/equipment Stand pivot transfers: +2 safety/equipment;Min assist (Nurse for lines)       General transfer comment: Assist to power up from bed. From toilet and use of rail assist for balance and safety  Ambulation/Gait Ambulation/Gait assistance: Min assist;+2 safety/equipment Ambulation Distance (Feet): 15 Feet Assistive device: Rolling walker (2 wheeled) Gait Pattern/deviations: Step-through pattern;Decreased stride length;Trunk flexed Gait velocity: decr Gait velocity interpretation: Below normal speed for age/gender General Gait Details: Assist for balance and support   Stairs            Wheelchair Mobility    Modified Rankin (Stroke Patients Only)       Balance Overall balance  assessment: Needs assistance Sitting-balance support: No upper extremity supported Sitting balance-Leahy Scale: Fair     Standing balance support: No upper extremity supported Standing balance-Leahy Scale: Poor Standing balance comment: walker and min guard for standing                    Cognition Arousal/Alertness: Awake/alert Behavior During Therapy: WFL for tasks assessed/performed Overall Cognitive Status: Within Functional Limits for tasks assessed                      Exercises      General Comments        Pertinent Vitals/Pain Pain Assessment: No/denies pain    Home Living                      Prior Function            PT Goals (current goals can now be found in the care plan section) Progress towards PT goals: Progressing toward goals    Frequency    Min 3X/week      PT Plan Current plan remains appropriate    Co-evaluation PT/OT/SLP Co-Evaluation/Treatment: Yes Reason for Co-Treatment: Complexity of the patient's impairments (multi-system involvement);For patient/therapist safety PT goals addressed during session: Mobility/safety with mobility       End of Session   Activity Tolerance: Patient tolerated treatment well Patient left: in chair;with call bell/phone within reach     Time: [id number] PT Time Calculation (min) (ACUTE ONLY): 40 min  Charges:  $Gait Training: 23-37 mins  G CodesShary Decamp [place]ok 03/11/2016, 3:42 PM [employer] PT [phone]

## 2016-03-12 ENCOUNTER — Telehealth: Payer: Self-pay

## 2016-03-12 LAB — CBC WITH DIFFERENTIAL/PLATELET
BASOS ABS: 0.1 10*3/uL (ref 0.0–0.1)
BASOS PCT: 1 %
EOS ABS: 0.4 10*3/uL (ref 0.0–0.7)
Eosinophils Relative: 5 %
HEMATOCRIT: 31.1 % — AB (ref 36.0–46.0)
HEMOGLOBIN: 9.2 g/dL — AB (ref 12.0–15.0)
LYMPHS PCT: 19 %
Lymphs Abs: 1.4 10*3/uL (ref 0.7–4.0)
MCH: 30.3 pg (ref 26.0–34.0)
MCHC: 29.6 g/dL — ABNORMAL LOW (ref 30.0–36.0)
MCV: 102.3 fL — ABNORMAL HIGH (ref 78.0–100.0)
MONOS PCT: 12 %
Monocytes Absolute: 0.9 10*3/uL (ref 0.1–1.0)
NEUTROS PCT: 63 %
Neutro Abs: 4.7 10*3/uL (ref 1.7–7.7)
Platelets: 106 10*3/uL — ABNORMAL LOW (ref 150–400)
RBC: 3.04 MIL/uL — ABNORMAL LOW (ref 3.87–5.11)
RDW: 18.5 % — ABNORMAL HIGH (ref 11.5–15.5)
WBC: 7.5 10*3/uL (ref 4.0–10.5)

## 2016-03-12 LAB — RENAL FUNCTION PANEL
ALBUMIN: 2.6 g/dL — AB (ref 3.5–5.0)
ANION GAP: 8 (ref 5–15)
Albumin: 2.6 g/dL — ABNORMAL LOW (ref 3.5–5.0)
Anion gap: 8 (ref 5–15)
BUN: 17 mg/dL (ref 6–20)
BUN: 14 mg/dL (ref 6–20)
CALCIUM: 8.9 mg/dL (ref 8.9–10.3)
CALCIUM: 9 mg/dL (ref 8.9–10.3)
CHLORIDE: 98 mmol/L — AB (ref 101–111)
CO2: 24 mmol/L (ref 22–32)
CO2: 24 mmol/L (ref 22–32)
CREATININE: 1.98 mg/dL — AB (ref 0.44–1.00)
Chloride: 100 mmol/L — ABNORMAL LOW (ref 101–111)
Creatinine, Ser: 2.14 mg/dL — ABNORMAL HIGH (ref 0.44–1.00)
GFR calc Af Amer: 29 mL/min — ABNORMAL LOW (ref >60)
GFR calc non Af Amer: 25 mL/min — ABNORMAL LOW (ref >60)
GFR calc non Af Amer: 23 mL/min — ABNORMAL LOW (ref >60)
GFR, EST AFRICAN AMERICAN: 27 mL/min — AB (ref >60)
GLUCOSE: 83 mg/dL (ref 65–99)
Glucose, Bld: 101 mg/dL — ABNORMAL HIGH (ref 65–99)
POTASSIUM: 4.3 mmol/L (ref 3.5–5.1)
Phosphorus: 3.4 mg/dL (ref 2.5–4.6)
Phosphorus: 3.3 mg/dL (ref 2.5–4.6)
Potassium: 4.3 mmol/L (ref 3.5–5.1)
SODIUM: 132 mmol/L — AB (ref 135–145)
Sodium: 130 mmol/L — ABNORMAL LOW (ref 135–145)

## 2016-03-12 LAB — COOXEMETRY PANEL
Carboxyhemoglobin: 1.9 % — ABNORMAL HIGH (ref 0.5–1.5)
METHEMOGLOBIN: 0.8 % (ref 0.0–1.5)
O2 SAT: 73 %
TOTAL HEMOGLOBIN: 10.9 g/dL — AB (ref 12.0–16.0)

## 2016-03-12 LAB — POCT ACTIVATED CLOTTING TIME
ACTIVATED CLOTTING TIME: 213 s
ACTIVATED CLOTTING TIME: 224 s
ACTIVATED CLOTTING TIME: 213 s
ACTIVATED CLOTTING TIME: 219 s
Activated Clotting Time: 202 seconds

## 2016-03-12 LAB — MAGNESIUM: Magnesium: 2.4 mg/dL (ref 1.7–2.4)

## 2016-03-12 MED ORDER — HYDROCODONE-ACETAMINOPHEN 5-325 MG PO TABS
1.0000 | ORAL_TABLET | ORAL | Status: DC | PRN
  Administered 2016-03-12: 2 via ORAL
  Administered 2016-03-13: 1 via ORAL
  Administered 2016-03-13 (×3): 2 via ORAL
  Filled 2016-03-12: qty 1
  Filled 2016-03-12 (×4): qty 2

## 2016-03-12 MED ORDER — MIDODRINE HCL 5 MG PO TABS
5.0000 mg | ORAL_TABLET | Freq: Three times a day (TID) | ORAL | Status: DC
  Administered 2016-03-12 – 2016-03-13 (×5): 5 mg via ORAL
  Filled 2016-03-12 (×5): qty 1

## 2016-03-12 NOTE — Progress Notes (Signed)
CRITICAL VALUE ALERT  Critical value received:  PTT >189  Date of notification:  03/12/16  Time of notification:  0645  Critical value read back: yes  Nurse who received alert:  Idelia Salm RN  Patient is on CRRT heparin, lab correlates with pervious daily results. Will pass on to AM RN.

## 2016-03-12 NOTE — Progress Notes (Signed)
Patient ID: Robyn Porter, female   DOB: [birth year], 65 y.o.   MRN: [id number]    Advanced Heart Failure Rounding Note   Subjective:    Admitted with marked volume overload. Sluggish diuresis despite addition of milrinone and switch to lasix gtt.   Renal consulted 03/04/16 with poor diuresis.  Pt placed on CRRT.   CVP 13-14.  Continues on CVVHD. Weight down 113 pounds total. Off milrinone  SBP  120-140s. Remains anuric    Denies SOB.   Echo: EF 60-65%, severe LVH, moderate AS with mean gradient 32 mmHg, RV not well visualized.   Objective:   Weight Range:  Vital Signs:   Temp:  [97.6 F (36.4 C)-98.6 F (37 C)] 97.6 F (36.4 C) (11/15 0400) Pulse Rate:  [60-70] 60 (11/15 0500) Resp:  [12-22] 21 (11/15 0600) SpO2:  [89 %-98 %] 89 % (11/15 0500) Arterial Line BP: (97-141)/(24-64) 141/64 (11/15 0600) Weight:  [289 lb 12.8 oz (131.5 kg)] 289 lb 12.8 oz (131.5 kg) (11/15 0500) Last BM Date: 03/11/16  Weight change: Filed Weights   03/10/16 0500 03/11/16 0637 03/12/16 0500  Weight: (!) 311 lb 8 oz (141.3 kg) 299 lb (135.6 kg) 289 lb 12.8 oz (131.5 kg)    Intake/Output:   Intake/Output Summary (Last 24 hours) at 03/12/16 0727 Last data filed at 03/12/16 0700  Gross per 24 hour  Intake             1090 ml  Output             6603 ml  Net            -5513 ml     Physical Exam: CVP 13-14 General:  Chronically ill appearing.  In the chair . CVVHD ongoing. HEENT: Normal Neck: supple. JVP remains elevated. Carotids 2+ bilat; no bruits. No thyromegaly or lymphadenopathy noted.    Cor: PMI nondisplaced. Regular. 3/6 systolic crescendo-decrescendo murmur RUSB.   Lungs: Distant, diminished basilar sounds.  Abdomen: obese, soft, breakthrough bleeding  Extremities: no cyanosis, clubbing, rash, Chronic 1+ edema into thighs. RIJ Temp dialysis cath.  Neuro: alert & orientedx3, cranial nerves grossly intact. moves all 4 extremities w/o difficulty. Affect pleasant  Telemetry:  Reviewed, afib PVCs under V-pacing.   Labs: Basic Metabolic Panel:  Recent Labs Lab 03/08/16 0500  03/09/16 0615  03/10/16 0445 03/10/16 1555 03/11/16 0449 03/11/16 1545 03/12/16 0414  NA 135  < > 134*  134*  < > 133* 131* 133* 130* 132*  K 3.8  < > 3.9  3.9  < > 3.8 3.8 4.4 4.3 4.3  CL 102  < > 102  101  < > 101 99* 101 98* 100*  CO2 25  < > 26  26  < > 23 25 26 22 24   GLUCOSE 110*  < > 110*  100*  < > 97 121* 95 103* 83  BUN 21*  < > 17  18  < > 17 16 17 19 17   CREATININE 1.59*  < > 1.77*  1.77*  < > 1.85* 1.93* 2.01* 2.17* 1.98*  CALCIUM 8.4*  < > 8.5*  8.4*  < > 8.6* 8.4* 8.8* 9.0 8.9  MG 2.4  --  2.3  --  2.4  --  2.4  --  2.4  PHOS 2.1*  < > 2.1*  < > 2.3* 1.8* 2.9 3.2 3.4  < > = values in this interval not displayed.  Liver Function Tests:  Recent Labs Lab 03/10/16 0445  03/10/16 1555 03/11/16 0449 03/11/16 1545 03/12/16 0414  ALBUMIN 2.5* 2.4* 2.6* 2.7* 2.6*   No results for input(s): LIPASE, AMYLASE in the last 168 hours. No results for input(s): AMMONIA in the last 168 hours.  CBC:  Recent Labs Lab 03/08/16 0500 03/09/16 0615 03/10/16 0445 03/11/16 0447 03/12/16 0414  WBC 8.6 8.2 7.4 8.2 7.5  NEUTROABS 5.5 5.7 4.8 5.2 4.7  HGB 8.7* 8.3* 8.8* 9.2* 9.2*  HCT 29.0* 27.3* 28.0* 30.2* 31.1*  MCV 102.5* 102.2* 102.2* 103.1* 102.3*  PLT 155 132* 115* 103* 106*    Cardiac Enzymes: No results for input(s): CKTOTAL, CKMB, CKMBINDEX, TROPONINI in the last 168 hours.  BNP: BNP (last 3 results)  Recent Labs  07/09/15 1158 07/19/15 1124 03/07/2016 1754  BNP 133.0* 149.0* 429.4*    ProBNP (last 3 results) No results for input(s): PROBNP in the last 8760 hours.    Other results:  Imaging: No results found.   Medications:     Scheduled Medications: . allopurinol  50 mg Oral Daily  . atorvastatin  20 mg Oral q1800  . cholecalciferol  5,000 Units Oral Daily  . darbepoetin (ARANESP) injection - NON-DIALYSIS  100 mcg Subcutaneous Q  Wed-1800  . guaiFENesin  600 mg Oral BID  . loratadine  10 mg Oral Daily  . mouth rinse  15 mL Mouth Rinse BID  . midodrine  7.5 mg Oral TID WC  . mometasone-formoterol  2 puff Inhalation BID  . multivitamin with minerals  1 tablet Oral Daily  . sodium chloride flush  10-40 mL Intracatheter Q12H  . cyanocobalamin  500 mcg Oral Daily  . vitamin C  500 mg Oral BID    Infusions: . heparin 10,000 units/ 20 mL infusion syringe 1,500 Units/hr (03/12/16 0700)  . norepinephrine (LEVOPHED) Adult infusion Stopped (03/08/16 1000)  . dialysis replacement fluid (prismasate) 300 mL/hr at 03/11/16 1458  . dialysis replacement fluid (prismasate) 200 mL/hr at 03/11/16 1458  . dialysate (PRISMASATE) 1,000 mL/hr at 03/12/16 0459    PRN Medications: Place/Maintain arterial line **AND** sodium chloride, acetaminophen, albuterol, benzonatate, chlorpheniramine-HYDROcodone, guaiFENesin-dextromethorphan, heparin, heparin, heparin, ipratropium-albuterol, ondansetron (ZOFRAN) IV, sodium chloride flush   Assessment/Plan/Discussion    Robyn Porter is a 65 y.o. female with history with chronic systolic CHF, Anemia, OHS/OSA, symptomatic bradycardia s/p MDT PPm with CRT-P upgrade given high percentage of RV pacing, CKD stage IV.  Admitted with marked volume overload.  1. Chronic diastolic CHF: EF [id number] on echo this admission, RV poorly visualized. Recent upgrade to [employer] CRT given constant RV pacing. Volume status markedly elevated. Weight up 30 lbs since August.  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  Renal function stable.  Milrinone begun for RV support.   Had to start CVVH for volume removal given diuretic failure. Now on midodrine, with arterial line in place able to come off norepinephrine.  CVP 13,  still volume overloaded. Overall weight down 113 pounds.  - Off milrinone, co-ox  73%.   - Continue CVVH. Pulling ~250-300 cc/hr this am.   - Cut back  midodrine to 5 mg three time a day.  - Ongoing discussion about need for IHD. Will need to observe for renal recovery once CRRT completed.  - Off BB with low coox on admission.  2. Symptomatic bradycardia: MDT PPM. Upgraded to CRT given high percentage RV pacing.  3. ARF on CKD Stage IV/V: Remains on CVVHD, UF at 250-300 cc/hr. 4. Chronic atrial fibrillation:  - Hold Toprol  XL for now with low output.  HR ok.  - On heparin gtt while getting CVVH.   5. OHS/OSA: Continue nightly CPAP.  6. 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 Given 1UPRBC on admit.  - States she has occasional hemorrhoidal bleed - Has been followed by Dr. Lindi Adie who is working up anemia - Hgb 8.4 -> 7.6 -> 8.2 -> 8.4 -> 8.2 -> 8.7 => 8.3=>8.8 =>9.2. Continue to follow.  7. Chronic venous stasis: Chronic changes, but stable. No cellulitis appreciated.   8. Morbid obesity: PT following. Recommending HHPT vs SNF.   9. Hypokalemia: Stable on CVVHD.   10. Aortic stenosis:  Moderate by echo 03/02/16.  Muffled S2 concerning that AS could be worse.  Would consider evaluation via TEE eventually => prior to discharge.   Length of Stay: [place], NP-C  03/12/2016, 7:27 AM  Advanced Heart Failure Team Pager [phone] (M-F; 7a - 4p)  Please contact [facility] for night-coverage after hours (4p -7a ) and weekends on [email or web address]  Patient seen with NP, agree with the above note.  Fluid still coming off briskly, stable BP.  CVP 13-14 with co-ox 73% off milrinone.  Anuric at this point.  - Continue UF today, may be near goal at this point => reassess tomorrow.   - Decrease midodrine to 5 mg tid.  - I suspect that she may end up needing long-term HD for volume management.  - Will plan for TEE prior to discharge after she has been fully diuresed (evaluate aortic valve).   [name] 03/12/2016 9:08 AM

## 2016-03-12 NOTE — Progress Notes (Signed)
[facility] Rounding Note  Subjective/Interval History:   CRRT started about 5PM 11/7 Difficulty obtaining accurate weights since starting but appears to be trending down 360 lbs 11/11 > 311 lbs > 299 lbs > 289 lbs 11/15.  Tolerating UF on midodrine at decreasing doses, now 5 mg TID down from 10 mg TID; milrinone off since a.m. of 11/13 and levophed off since morning of 11/11 Net neg 300/hour most hours; goal 300/hr  No filter issues A-line placed for BP monitoring Anuric (47 in past 24 hours)  Patient did not feel dizzy or nauseated yesterday. She says she was able to walk across her room with help. She denies SOB but still has a cough and has hoarse voice.   Objective Vital signs in last 24 hours: Vitals:   03/12/16 0500 03/12/16 0600 03/12/16 0700 03/12/16 0730  BP:      Pulse: 60     Resp: (!) 22 (!) 21 13 16   Temp:    97.8 F (36.6 C)  TempSrc:    Oral  SpO2: (!) 89%   92%  Weight: 289 lb 12.8 oz (131.5 kg)     Height:       Weight change: -9 lb 3.2 oz (-4.173 kg)  Intake/Output Summary (Last 24 hours) at 03/12/16 0753 Last data filed at 03/12/16 0700  Gross per 24 hour  Intake             1090 ml  Output             6603 ml  Net            -5513 ml   Net negative 40.4 L since admission.   HEMODYNAMICS: CVP:  [13 mmHg-17 mmHg] 13 mmHg  Physical Exam: Blood pressure (!) 110/46, pulse 60, temperature 97.8 F (36.6 C), temperature source Oral, resp. rate 16, height 5\' 4"  (1.626 m), weight 289 lb 12.8 oz (131.5 kg), SpO2 92 %. [name] Good spirits Not on O2 R IJ temp cath (11/7) Lungs grossly clear  Left sided pacemaker Irreg irreg. [id number], 2/6 systolic murmur USB heard up into the neck and across precordium  Abdomen obese, large pannus Anasarca with 1+ pitting edema both LE's to knees with hyperpigmentation of shins, pitting of thighs, upper arms and abdomen; improving -- can now see knuckles on hands    Recent Labs Lab 03/09/16 0615 03/09/16 1632  03/10/16 0445 03/10/16 1555 03/11/16 0449 03/11/16 1545 03/12/16 0414  NA 134*  134* 131* 133* 131* 133* 130* 132*  K 3.9  3.9 3.8 3.8 3.8 4.4 4.3 4.3  CL 102  101 99* 101 99* 101 98* 100*  CO2 26  26 24 23 25 26 22 24   GLUCOSE 110*  100* 119* 97 121* 95 103* 83  BUN 17  18 18 17 16 17 19 17   CREATININE 1.77*  1.77* 1.84* 1.85* 1.93* 2.01* 2.17* 1.98*  CALCIUM 8.5*  8.4* 8.4* 8.6* 8.4* 8.8* 9.0 8.9  PHOS 2.1* 1.8* 2.3* 1.8* 2.9 3.2 3.4     Recent Labs Lab 03/11/16 0449 03/11/16 1545 03/12/16 0414  ALBUMIN 2.6* 2.7* 2.6*     Recent Labs Lab 03/09/16 0615 03/10/16 0445 03/11/16 0447 03/12/16 0414  WBC 8.2 7.4 8.2 7.5  NEUTROABS 5.7 4.8 5.2 4.7  HGB 8.3* 8.8* 9.2* 9.2*  HCT 27.3* 28.0* 30.2* 31.1*  MCV 102.2* 102.2* 103.1* 102.3*  PLT 132* 115* 103* 106*   Results for PAMMIE, GOTTESMAN (MRN [id number]) as of 03/06/2016 07:28  Ref. Range 03/04/2016 14:10  PTH Latest Ref Range: 15 - 65 pg/mL 50    Medications: . heparin 10,000 units/ 20 mL infusion syringe 1,450 Units/hr (03/12/16 0731)  . dialysis replacement fluid (prismasate) 300 mL/hr at 03/11/16 1458  . dialysis replacement fluid (prismasate) 200 mL/hr at 03/11/16 1458  . dialysate (PRISMASATE) 1,000 mL/hr at 03/12/16 0459   . allopurinol  50 mg Oral Daily  . atorvastatin  20 mg Oral q1800  . cholecalciferol  5,000 Units Oral Daily  . darbepoetin (ARANESP) injection - NON-DIALYSIS  100 mcg Subcutaneous Q Wed-1800  . guaiFENesin  600 mg Oral BID  . loratadine  10 mg Oral Daily  . mouth rinse  15 mL Mouth Rinse BID  . midodrine  5 mg Oral TID WC  . mometasone-formoterol  2 puff Inhalation BID  . multivitamin with minerals  1 tablet Oral Daily  . sodium chloride flush  10-40 mL Intracatheter Q12H  . cyanocobalamin  500 mcg Oral Daily  . vitamin C  500 mg Oral BID     Background: 65 y.o. 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 Hollie Salk; Baseline worsening over course of 2017),  super morbid obesity,  OHS/OSA (CPAP), dCHF, AS, pacemaker, AF. Admitted at least 3X 2017 with massive volume overload that responded to diuretics on those occasions (05/2015, 08/2015, 11/2015)  Admitted 11/1 with 30 lb weight increase since August, SOB, worsening edema. Failed high dose lasix and metolazone + milrinone w/inability to achieve neg fluid balance.Creatinine had been 2.4-2.7 during the admission.  Renal consulted for CRRT.  Pt  acknowledged/accepted that she could end up on dialysis permanently. Catheter placed and CRRT started 11/7. Norepi added 11/10 for BP but stopped 11/11. Aline 11/11 for accurate BP monitoring. Will continue to try for goal of net negative 300 ml/hr. Unsure of dry weight but still is fluid overloaded. Requiring less BP support with lower doses of midodrine. May be able to trial intermittent HD later this week.   Assessment/Recommendations  1. Diuretic refractory CHF/volume overload - CRRT started late 11/7 - goal 300 off. Trouble obtaining accurate weights, but clinical and exam improvement. Currently tolerating UFR on midodrine 5 mg TID.  1. Continue with all 4K fluids/300/200/1000/heparin in circuit.  2. K and phos OK so far and have not yet required replacement. Phos 3.4 today.  3. Continue midodrine for BP support (can always restart levo if needed) 4. Try for 300 neg balance  2. CKD4 -Baseline creatinine has been worsening over the course of 2017 - most recently  2.4-2.6. Entirely possible (in fact very likely) that she will remain dialysis dependent.  PICC line was removed (future vascular access site). Patient would be interested in [place] as HD site.   3. Chronic dCHF. EF 60-65%. Suspicion primary RV failure. ECHO this admission "nl RV fx" but RV not well visualized. Milrinone d/c'ed 11/13.  4. AS - "moderate" on TTE. Dr. Aundra Dubin indicates that TEE best way to assess. Another reason to be careful dropping preload too fast. (and another reason for UF intolerance  potentially). 5. Pacemaker status 6. AFib - Heparin with CRRT  7. Anemia - h/o AVM's. Fe deficient. Dosed with feraheme 11/7. Transfused 1 unit PRBC's 11/8, 2 u 11/3. Started Aranesp 100/week 1st dosed 11/8. ReStable low to mid 8's. 8. Metabolic bone - PTH 50, no intervention needed. Ca and phos OK 9. OHS/OSA - CPAP at night. Has own machine. 10. Super morbid obesity. Need to keep her mobile. 11. PICC out (  future HD access will be an issue)  Olene Floss, MD [place], PGY-2 03/12/2016, 7:53 AM

## 2016-03-12 NOTE — Telephone Encounter (Signed)
Spoke with pt regarding concerns with a follow up appt with Dr. Lindi Adie about her lab work  (ferritin and iron studies). Pt is currently in the hospital (cardiac ICU) getting 24 hrs dialysis treatment and treatment with her CHF. Pt does not know when she will be discharge but would like to notify Dr. Lindi Adie that she is still in the [place] pt that I will let Dr. Lindi Adie know that pt is admitted. Placed an in basket scheduling request for pt to come in the next 2 weeks. Pt should get a confirmation of her appt for Dr.Gudena in the next few days. Pt verbalized understanding and has no further questions at this time.

## 2016-03-12 NOTE — Progress Notes (Addendum)
Physical Therapy Progress Note     03/12/16 1000  PT Visit Information  Last PT Received On 03/12/16  Assistance Needed +2 (for management of CVVHD)  History of Present Illness Pt adm with significant volume overload. Attempts to diurese not working well and pt started on CVVHD on 11/7. PMH - chronic systolic heart failure, htn, dm, pacer, copd, ckd  Subjective Data  Patient Stated Goal return home  Precautions  Precautions Fall  Precaution Comments temp dialysis catheter R IJ  Restrictions  Weight Bearing Restrictions No  Pain Assessment  Pain Assessment No/denies pain  Cognition  Arousal/Alertness Awake/alert  Behavior During Therapy WFL for tasks assessed/performed  Overall Cognitive Status Within Functional Limits for tasks assessed  Bed Mobility  General bed mobility comments pt sitting OOB in recliner when PT entered room  Transfers  General transfer comment pt performed chair level therapeutic exercises  Balance  Overall balance assessment Needs assistance  Sitting-balance support Feet supported;No upper extremity supported  Sitting balance-Leahy Scale Fair  Exercises  Exercises General Lower Extremity;Other exercises  General Exercises - Lower Extremity  Ankle Circles/Pumps AROM;Both;20 reps;Seated  Quad Sets AROM;Strengthening;Both;10 reps;Seated  Gluteal Sets AROM;Strengthening;Both;15 reps;Seated  Long [employer] AROM;Strengthening;Both;15 reps;Seated  Heel Slides AROM;Strengthening;Both;10 reps;Seated  Hip Flexion/Marching AROM;Strengthening;Both;15 reps;Seated  Other Exercises  Other Exercises chair push ups with bilateral UEs, x10 reps  PT - End of Session  Activity Tolerance Patient tolerated treatment well  Patient left in chair;with call bell/phone within reach  Nurse Communication Mobility status  PT - Assessment/Plan  PT Plan Current plan remains appropriate;Other (comment) (goals updated)  PT Frequency (ACUTE ONLY) Min 3X/week  Follow Up  Recommendations SNF  PT equipment None recommended by PT  PT Goal Progression  Progress towards PT goals Progressing toward goals  Acute Rehab PT Goals  PT Goal Formulation With patient  Time For Goal Achievement 03/26/16  Potential to Achieve Goals Fair  PT Time Calculation  PT Start Time (ACUTE ONLY) 0932  PT Stop Time (ACUTE ONLY) 0957  PT Time Calculation (min) (ACUTE ONLY) 25 min  PT General Charges  $$ ACUTE PT VISIT 1 Procedure  PT Treatments  $Therapeutic Exercise 23-37 mins    Clinical Assessment: Pt presented sitting OOB in recliner when PT entered room. Pt on CVVHD. She was willing to participate in therapeutic exercises in chair. PT demonstrated and instructed pt as well as her nurse in HEP for bilateral LEs. Pt demonstrated ability to complete (see above). Pt would continue to benefit from skilled physical therapy services at this time while admitted and after d/c to address her limitations in order to improve her overall safety and independence with functional mobility. All VSS throughout.  [place], [place], [place] [phone]

## 2016-03-13 ENCOUNTER — Encounter (HOSPITAL_COMMUNITY): Payer: Self-pay | Admitting: Anesthesiology

## 2016-03-13 ENCOUNTER — Inpatient Hospital Stay (HOSPITAL_COMMUNITY): Payer: Medicare Other

## 2016-03-13 ENCOUNTER — Telehealth: Payer: Self-pay | Admitting: Hematology and Oncology

## 2016-03-13 DIAGNOSIS — J9601 Acute respiratory failure with hypoxia: Secondary | ICD-10-CM

## 2016-03-13 LAB — RENAL FUNCTION PANEL
ALBUMIN: 2.7 g/dL — AB (ref 3.5–5.0)
ANION GAP: 7 (ref 5–15)
Albumin: 2.7 g/dL — ABNORMAL LOW (ref 3.5–5.0)
Anion gap: 6 (ref 5–15)
BUN: 17 mg/dL (ref 6–20)
BUN: 17 mg/dL (ref 6–20)
CALCIUM: 9.3 mg/dL (ref 8.9–10.3)
CHLORIDE: 100 mmol/L — AB (ref 101–111)
CO2: 27 mmol/L (ref 22–32)
CO2: 25 mmol/L (ref 22–32)
CREATININE: 2.11 mg/dL — AB (ref 0.44–1.00)
Calcium: 9.5 mg/dL (ref 8.9–10.3)
Chloride: 100 mmol/L — ABNORMAL LOW (ref 101–111)
Creatinine, Ser: 2.18 mg/dL — ABNORMAL HIGH (ref 0.44–1.00)
GFR calc non Af Amer: 23 mL/min — ABNORMAL LOW (ref >60)
GFR, EST AFRICAN AMERICAN: 27 mL/min — AB (ref >60)
GFR, EST AFRICAN AMERICAN: 26 mL/min — AB (ref >60)
GFR, EST NON AFRICAN AMERICAN: 22 mL/min — AB (ref >60)
GLUCOSE: 96 mg/dL (ref 65–99)
Glucose, Bld: 121 mg/dL — ABNORMAL HIGH (ref 65–99)
PHOSPHORUS: 4.7 mg/dL — AB (ref 2.5–4.6)
POTASSIUM: 4.9 mmol/L (ref 3.5–5.1)
Phosphorus: 4.1 mg/dL (ref 2.5–4.6)
Potassium: 4.6 mmol/L (ref 3.5–5.1)
SODIUM: 133 mmol/L — AB (ref 135–145)
Sodium: 132 mmol/L — ABNORMAL LOW (ref 135–145)

## 2016-03-13 LAB — CBC WITH DIFFERENTIAL/PLATELET
BASOS PCT: 1 %
Basophils Absolute: 0.1 10*3/uL (ref 0.0–0.1)
EOS ABS: 0.5 10*3/uL (ref 0.0–0.7)
Eosinophils Relative: 5 %
HCT: 31.9 % — ABNORMAL LOW (ref 36.0–46.0)
HEMOGLOBIN: 9.7 g/dL — AB (ref 12.0–15.0)
LYMPHS PCT: 17 %
Lymphs Abs: 1.5 10*3/uL (ref 0.7–4.0)
MCH: 30.8 pg (ref 26.0–34.0)
MCHC: 30.4 g/dL (ref 30.0–36.0)
MCV: 101.3 fL — ABNORMAL HIGH (ref 78.0–100.0)
Monocytes Absolute: 1.4 10*3/uL — ABNORMAL HIGH (ref 0.1–1.0)
Monocytes Relative: 15 %
NEUTROS PCT: 62 %
Neutro Abs: 5.5 10*3/uL (ref 1.7–7.7)
Platelets: 95 10*3/uL — ABNORMAL LOW (ref 150–400)
RBC: 3.15 MIL/uL — ABNORMAL LOW (ref 3.87–5.11)
RDW: 18.1 % — ABNORMAL HIGH (ref 11.5–15.5)
WBC: 9 10*3/uL (ref 4.0–10.5)

## 2016-03-13 LAB — CBC
HCT: 28.7 % — ABNORMAL LOW (ref 36.0–46.0)
Hemoglobin: 8.6 g/dL — ABNORMAL LOW (ref 12.0–15.0)
MCH: 30.8 pg (ref 26.0–34.0)
MCHC: 30 g/dL (ref 30.0–36.0)
MCV: 102.9 fL — ABNORMAL HIGH (ref 78.0–100.0)
PLATELETS: 118 10*3/uL — AB (ref 150–400)
RBC: 2.79 MIL/uL — AB (ref 3.87–5.11)
RDW: 17.9 % — ABNORMAL HIGH (ref 11.5–15.5)
WBC: 12.7 10*3/uL — AB (ref 4.0–10.5)

## 2016-03-13 LAB — BASIC METABOLIC PANEL
Anion gap: 9 (ref 5–15)
BUN: 15 mg/dL (ref 6–20)
CALCIUM: 7.8 mg/dL — AB (ref 8.9–10.3)
CO2: 18 mmol/L — ABNORMAL LOW (ref 22–32)
CREATININE: 1.97 mg/dL — AB (ref 0.44–1.00)
Chloride: 107 mmol/L (ref 101–111)
GFR calc non Af Amer: 25 mL/min — ABNORMAL LOW (ref >60)
GFR, EST AFRICAN AMERICAN: 29 mL/min — AB (ref >60)
Glucose, Bld: 126 mg/dL — ABNORMAL HIGH (ref 65–99)
Potassium: 4.3 mmol/L (ref 3.5–5.1)
SODIUM: 134 mmol/L — AB (ref 135–145)

## 2016-03-13 LAB — POCT ACTIVATED CLOTTING TIME
ACTIVATED CLOTTING TIME: 208 s
ACTIVATED CLOTTING TIME: 208 s
ACTIVATED CLOTTING TIME: 208 s
ACTIVATED CLOTTING TIME: 213 s
ACTIVATED CLOTTING TIME: 213 s
ACTIVATED CLOTTING TIME: 213 s
ACTIVATED CLOTTING TIME: 219 s
ACTIVATED CLOTTING TIME: 224 s
ACTIVATED CLOTTING TIME: 224 s
ACTIVATED CLOTTING TIME: 224 s
ACTIVATED CLOTTING TIME: 230 s
ACTIVATED CLOTTING TIME: 230 s
Activated Clotting Time: 219 seconds
Activated Clotting Time: 219 seconds
Activated Clotting Time: 230 seconds
Activated Clotting Time: 235 seconds
Activated Clotting Time: 224 seconds
Activated Clotting Time: 224 seconds
Activated Clotting Time: 224 seconds
Activated Clotting Time: 213 seconds
Activated Clotting Time: 202 seconds

## 2016-03-13 LAB — POCT I-STAT 3, ART BLOOD GAS (G3+)
Acid-base deficit: 5 mmol/L — ABNORMAL HIGH (ref 0.0–2.0)
Bicarbonate: 20.7 mmol/L (ref 20.0–28.0)
O2 Saturation: 100 %
PCO2 ART: 41.6 mmHg (ref 32.0–48.0)
PO2 ART: 320 mmHg — AB (ref 83.0–108.0)
TCO2: 22 mmol/L (ref 0–100)
pH, Arterial: 7.303 — ABNORMAL LOW (ref 7.350–7.450)

## 2016-03-13 LAB — COOXEMETRY PANEL
Carboxyhemoglobin: 2.5 % — ABNORMAL HIGH (ref 0.5–1.5)
METHEMOGLOBIN: 0.7 % (ref 0.0–1.5)
O2 SAT: 69.1 %
TOTAL HEMOGLOBIN: 9.2 g/dL — AB (ref 12.0–16.0)

## 2016-03-13 LAB — LACTIC ACID, PLASMA: LACTIC ACID, VENOUS: 5.8 mmol/L — AB (ref 0.5–1.9)

## 2016-03-13 LAB — MAGNESIUM
Magnesium: 2.4 mg/dL (ref 1.7–2.4)
Magnesium: 2.3 mg/dL (ref 1.7–2.4)

## 2016-03-13 LAB — APTT
aPTT: 189 seconds (ref 24–36)
aPTT: >200 seconds (ref 24–36)

## 2016-03-13 LAB — TROPONIN I: TROPONIN I: 0.03 ng/mL — AB (ref <0.03)

## 2016-03-13 LAB — GLUCOSE, CAPILLARY: Glucose-Capillary: 111 mg/dL — ABNORMAL HIGH (ref 65–99)

## 2016-03-13 MED ORDER — MIDAZOLAM HCL 2 MG/2ML IJ SOLN
INTRAMUSCULAR | Status: AC
  Administered 2016-03-13: 22:00:00
  Filled 2016-03-13: qty 2

## 2016-03-13 MED ORDER — FENTANYL BOLUS VIA INFUSION
25.0000 ug | INTRAVENOUS | Status: DC | PRN
  Filled 2016-03-13: qty 25

## 2016-03-13 MED ORDER — FENTANYL CITRATE (PF) 100 MCG/2ML IJ SOLN
INTRAMUSCULAR | Status: AC
  Administered 2016-03-13: 100 ug
  Filled 2016-03-13: qty 2

## 2016-03-13 MED ORDER — MIDODRINE HCL 5 MG PO TABS
5.0000 mg | ORAL_TABLET | Freq: Three times a day (TID) | ORAL | Status: DC
  Administered 2016-03-14 – 2016-03-16 (×6): 5 mg
  Filled 2016-03-13 (×6): qty 1

## 2016-03-13 MED ORDER — IPRATROPIUM-ALBUTEROL 0.5-2.5 (3) MG/3ML IN SOLN
3.0000 mL | Freq: Four times a day (QID) | RESPIRATORY_TRACT | Status: DC
  Administered 2016-03-14 – 2016-03-16 (×8): 3 mL via RESPIRATORY_TRACT
  Filled 2016-03-13 (×2): qty 3
  Filled 2016-03-13: qty 30
  Filled 2016-03-13 (×5): qty 3

## 2016-03-13 MED ORDER — FENTANYL CITRATE (PF) 100 MCG/2ML IJ SOLN: 50.0000 ug | Freq: Once | INTRAMUSCULAR | Status: AC

## 2016-03-13 MED ORDER — BUDESONIDE 0.5 MG/2ML IN SUSP
0.5000 mg | Freq: Two times a day (BID) | RESPIRATORY_TRACT | Status: DC
  Administered 2016-03-14 – 2016-03-16 (×5): 0.5 mg via RESPIRATORY_TRACT
  Filled 2016-03-13 (×5): qty 2

## 2016-03-13 MED ORDER — DEXTROSE 5 % IV SOLN
2.0000 g | Freq: Two times a day (BID) | INTRAVENOUS | Status: DC
  Administered 2016-03-14: 2 g via INTRAVENOUS
  Filled 2016-03-13 (×2): qty 2

## 2016-03-13 MED ORDER — FENTANYL CITRATE (PF) 100 MCG/2ML IJ SOLN
50.0000 ug | INTRAMUSCULAR | Status: DC | PRN
  Filled 2016-03-13: qty 2

## 2016-03-13 MED ORDER — ATORVASTATIN CALCIUM 20 MG PO TABS
20.0000 mg | ORAL_TABLET | Freq: Every day | ORAL | Status: DC
  Administered 2016-03-14 – 2016-03-15 (×2): 20 mg
  Filled 2016-03-13 (×2): qty 1

## 2016-03-13 MED ORDER — MIDAZOLAM HCL 2 MG/2ML IJ SOLN: 1.0000 mg | INTRAMUSCULAR | Status: DC | PRN

## 2016-03-13 MED ORDER — NOREPINEPHRINE BITARTRATE 1 MG/ML IV SOLN
2.0000 ug/min | INTRAVENOUS | Status: DC
  Administered 2016-03-14 (×2): 30 ug/min via INTRAVENOUS
  Filled 2016-03-13 (×2): qty 4

## 2016-03-13 MED ORDER — MIDAZOLAM HCL 2 MG/2ML IJ SOLN
1.0000 mg | INTRAMUSCULAR | Status: DC | PRN
  Filled 2016-03-13: qty 2

## 2016-03-13 MED ORDER — MIDAZOLAM HCL 2 MG/2ML IJ SOLN
1.0000 mg | INTRAMUSCULAR | Status: AC | PRN
Start: 2016-03-13 — End: 2016-03-13
  Administered 2016-03-13 (×3): 1 mg via INTRAVENOUS

## 2016-03-13 MED ORDER — VANCOMYCIN HCL 10 G IV SOLR: 1250.0000 mg | INTRAVENOUS | Status: DC

## 2016-03-13 MED ORDER — VANCOMYCIN HCL 10 G IV SOLR
2500.0000 mg | Freq: Once | INTRAVENOUS | Status: AC
  Administered 2016-03-14: 2500 mg via INTRAVENOUS
  Filled 2016-03-13: qty 2500

## 2016-03-13 MED ORDER — MIDAZOLAM HCL 2 MG/2ML IJ SOLN
INTRAMUSCULAR | Status: AC
  Filled 2016-03-13: qty 2

## 2016-03-13 MED ORDER — FAMOTIDINE IN NACL 20-0.9 MG/50ML-% IV SOLN
20.0000 mg | INTRAVENOUS | Status: DC
  Administered 2016-03-14 – 2016-03-15 (×3): 20 mg via INTRAVENOUS
  Filled 2016-03-13 (×3): qty 50

## 2016-03-13 MED ORDER — FENTANYL CITRATE (PF) 100 MCG/2ML IJ SOLN: 50.0000 ug | INTRAMUSCULAR | Status: DC | PRN

## 2016-03-13 MED ORDER — FENTANYL 2500MCG IN NS 250ML (10MCG/ML) PREMIX INFUSION
25.0000 ug/h | INTRAVENOUS | Status: DC
  Administered 2016-03-14: 150 ug/h via INTRAVENOUS
  Filled 2016-03-13: qty 250

## 2016-03-13 MED ORDER — ARFORMOTEROL TARTRATE 15 MCG/2ML IN NEBU
15.0000 ug | INHALATION_SOLUTION | Freq: Two times a day (BID) | RESPIRATORY_TRACT | Status: DC
  Administered 2016-03-14 – 2016-03-16 (×5): 15 ug via RESPIRATORY_TRACT
  Filled 2016-03-13 (×5): qty 2

## 2016-03-13 MED ORDER — MIDAZOLAM HCL 2 MG/2ML IJ SOLN
1.0000 mg | INTRAMUSCULAR | Status: DC | PRN
  Administered 2016-03-14 – 2016-03-16 (×9): 1 mg via INTRAVENOUS
  Filled 2016-03-13 (×10): qty 2

## 2016-03-13 NOTE — Procedures (Signed)
Arterial Catheter Insertion Procedure Note Joannah Lockamy [id number] 12-11-1950  Procedure: Insertion of Arterial Catheter  Indications: Blood pressure monitoring  Procedure Details Consent: Unable to obtain consent because of emergent medical necessity. Time Out: Verified patient identification, verified procedure, site/side was marked, verified correct patient position, special equipment/implants available, medications/allergies/relevent history reviewed, required imaging and test results available.  Performed  Maximum sterile technique was used including antiseptics, cap, gloves, gown, hand hygiene and mask. Skin prep: Chlorhexidine; local anesthetic administered 20 gauge catheter was inserted into left radial artery using the Seldinger technique.  Evaluation Blood flow good; BP tracing good. Complications: No apparent complications.  Art-line placement: Blood Pressure Monitoring  Emergently neccessary for arterial line placement, due to decrease soft blood pressure systolic in the low 0000000 with decrease MAP. Skin was prep with chlorhexidine and left radial art-line was placed. Good blood flow and the line flushes without complications. No complications noted throughout procedure. Patient tolerated it well.   Leigh Aurora, BS, RRT, RCP  03/13/2016

## 2016-03-13 NOTE — Progress Notes (Addendum)
Pharmacy Antibiotic Note  Robyn Porter is a 65 y.o. female admitted on 03/03/2016 with a/c CHF, now CRRT stopped post arrest w/ concern for sepsis.  Pharmacy has been consulted for vancomycin and cefepime dosing.  Plan: Vancomycin 2500mg  load x1 IV and reassess SCr while off CRRT.  Goal trough 15-20 mcg/mL.  Cefepime 2g IV every 24 hours.  Height: 5\' 4"  (162.6 cm) Weight: 277 lb 1.6 oz (125.7 kg) IBW/kg (Calculated) : 54.7  Temp (24hrs), Avg:97.5 F (36.4 C), Min:97.3 F (36.3 C), Max:97.9 F (36.6 C)   Recent Labs Lab 03/10/16 0445  03/11/16 0447  03/12/16 0414 03/12/16 1615 03/13/16 0410 03/13/16 1623 03/13/16 2133 03/13/16 2157  WBC 7.4  --  8.2  --  7.5  --  9.0  --   --  12.7*  CREATININE 1.85*  < >  --   < > 1.98* 2.14* 2.11* 2.18*  --  1.97*  LATICACIDVEN  --   --   --   --   --   --   --   --  5.8*  --   < > = values in this interval not displayed.  Estimated Creatinine Clearance: 36.9 mL/min (by C-G formula based on SCr of 1.97 mg/dL (H)).    Allergies  Allergen Reactions  . Adhesive [Tape] Other (See Comments)    Tears skin.  Please use "paper" tape  . Amoxicillin Hives  . Contrast Media [Iodinated Diagnostic Agents] Hives  . Shellfish-Derived Products Hives    Can eat crab cakes  . Benadryl [Diphenhydramine Hcl] Rash     Thank you for allowing pharmacy to be a part of this patient's care.  Wynona Neat, PharmD, BCPS  03/13/2016 11:37 PM

## 2016-03-13 NOTE — Progress Notes (Signed)
[facility] Rounding Note  Subjective/Interval History:   CRRT started about 5PM 11/7  Tolerating UF on midodrine at decreasing doses, now 5 mg TID down from 10 mg TID; milrinone off since a.m. of 11/13 and levophed off since morning of 11/11  Doing well.  Less pitting edema everywhere.  Still on midodrine 5 mg TID   Objective Vital signs in last 24 hours: Vitals:   03/13/16 0900 03/13/16 1000 03/13/16 1100 03/13/16 1206  BP: (!) 104/33 (!) 105/47 (!) 93/50   Pulse:      Resp: 16 13 13    Temp:    97.9 F (36.6 C)  TempSrc:    Oral  SpO2:    94%  Weight:      Height:       Weight change: -5.761 kg (-12 lb 11.2 oz)  Intake/Output Summary (Last 24 hours) at 03/13/16 1211 Last data filed at 03/13/16 1200  Gross per 24 hour  Intake              240 ml  Output             7804 ml  Net            -7564 ml    HEMODYNAMICS: CVP:  [13 mmHg-17 mmHg] 13 mmHg  Physical Exam: Blood pressure (!) 110/46, pulse 60, temperature 97.8 F (36.6 C), temperature source Oral, resp. rate 16, height 5\' 4"  (1.626 m), weight 289 lb 12.8 oz (131.5 kg), SpO2 92 %. [name] Good spirits Not on O2 R IJ temp cath (11/7) Lungs grossly clear  Left sided pacemaker Irreg irreg. [id number], 2/6 systolic murmur USB heard up into the neck and across precordium  Abdomen obese, large pannus Anasarca with 1+ pitting edema both LE's to knees with hyperpigmentation of shins, pitting of thighs, upper arms and abdomen; improving -- can now see knuckles on hands, continues to improve    Recent Labs Lab 03/10/16 0445 03/10/16 1555 03/11/16 0449 03/11/16 1545 03/12/16 0414 03/12/16 1615 03/13/16 0410  NA 133* 131* 133* 130* 132* 130* 133*  K 3.8 3.8 4.4 4.3 4.3 4.3 4.6  CL 101 99* 101 98* 100* 98* 100*  CO2 23 25 26 22 24 24 27   GLUCOSE 97 121* 95 103* 83 101* 96  BUN 17 16 17 19 17 14 17   CREATININE 1.85* 1.93* 2.01* 2.17* 1.98* 2.14* 2.11*  CALCIUM 8.6* 8.4* 8.8* 9.0 8.9 9.0 9.3  PHOS 2.3* 1.8* 2.9  3.2 3.4 3.3 4.1     Recent Labs Lab 03/12/16 0414 03/12/16 1615 03/13/16 0410  ALBUMIN 2.6* 2.6* 2.7*     Recent Labs Lab 03/10/16 0445 03/11/16 0447 03/12/16 0414 03/13/16 0410  WBC 7.4 8.2 7.5 9.0  NEUTROABS 4.8 5.2 4.7 5.5  HGB 8.8* 9.2* 9.2* 9.7*  HCT 28.0* 30.2* 31.1* 31.9*  MCV 102.2* 103.1* 102.3* 101.3*  PLT 115* 103* 106* 95*   Results for Robyn Porter, Robyn Porter (MRN [id number]) as of 03/06/2016 07:28  Ref. Range 03/04/2016 14:10  PTH Latest Ref Range: 15 - 65 pg/mL 50    Medications: . heparin 10,000 units/ 20 mL infusion syringe 1,000 Units/hr (03/13/16 1012)  . dialysis replacement fluid (prismasate) 300 mL/hr at 03/12/16 2031  . dialysis replacement fluid (prismasate) 200 mL/hr at 03/12/16 1635  . dialysate (PRISMASATE) 1,000 mL/hr at 03/13/16 1205   . allopurinol  50 mg Oral Daily  . atorvastatin  20 mg Oral q1800  . cholecalciferol  5,000 Units Oral Daily  .  darbepoetin (ARANESP) injection - NON-DIALYSIS  100 mcg Subcutaneous Q Wed-1800  . guaiFENesin  600 mg Oral BID  . loratadine  10 mg Oral Daily  . mouth rinse  15 mL Mouth Rinse BID  . midodrine  5 mg Oral TID WC  . mometasone-formoterol  2 puff Inhalation BID  . multivitamin with minerals  1 tablet Oral Daily  . sodium chloride flush  10-40 mL Intracatheter Q12H  . cyanocobalamin  500 mcg Oral Daily  . vitamin C  500 mg Oral BID     Background: 65 y.o. 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 Hollie Salk; Baseline worsening over course of 2017),  super morbid obesity, OHS/OSA (CPAP), dCHF, AS, pacemaker, AF. Admitted at least 3X 2017 with massive volume overload that responded to diuretics on those occasions (05/2015, 08/2015, 11/2015)  Admitted 11/1 with 30 lb weight increase since August, SOB, worsening edema. Failed high dose lasix and metolazone + milrinone w/inability to achieve neg fluid balance.Creatinine had been 2.4-2.7 during the admission.  Renal consulted for CRRT.  Pt  acknowledged/accepted that  she could end up on dialysis permanently. Catheter placed and CRRT started 11/7. Norepi added 11/10 for BP but stopped 11/11. Aline 11/11 for accurate BP monitoring. Will continue to try for goal of net negative 300 ml/hr. Unsure of dry weight but still is fluid overloaded. Requiring less BP support with lower doses of midodrine. May be able to trial intermittent HD later this week.   Assessment/Recommendations  1. Diuretic refractory CHF/volume overload - CRRT started late 11/7 - goal 300 off. Trouble obtaining accurate weights, but clinical and exam improvement. Currently tolerating UFR on midodrine 5 mg TID.  1. Continue with all 4K fluids/300/200/1000/heparin in circuit.  2. K and phos OK so far and have not yet required replacement. Phos 3.4 today.  3. Continue midodrine 5mg  TID 4. Try for 300 neg balance 5. Will need to convert to IHD at some point- tenative plan to stop CRRT Friday and try IHD Saturday.  2. CKD4 -Baseline creatinine has been worsening over the course of 2017 - most recently  2.4-2.6. Entirely possible (in fact very likely) that she will remain dialysis dependent.  PICC line was removed (future vascular access site). Patient would be interested in [place] as HD site.  3. Chronic dCHF. EF 60-65%. Suspicion primary RV failure. ECHO this admission "nl RV fx" but RV not well visualized. Milrinone d/c'ed 11/13.  4. AS - "moderate" on TTE. Dr. Aundra Dubin indicates that TEE best way to assess.  5. Pacemaker status 6. AFib - Heparin with CRRT  7. Anemia - h/o AVM's. Fe deficient. Dosed with feraheme 11/7. Transfused 1 unit PRBC's 11/8, 2 u 11/3. Started Aranesp 100/week 1st dosed 11/8. ReStable low to mid 8's. 8. Metabolic bone - PTH 50, no intervention needed. Ca and phos OK 9. OHS/OSA - CPAP at night. Has own machine. 10. Super morbid obesity. Need to keep her mobile. 11. PICC out (future HD access will be an issue)  [name] [facility] Cell  [phone] pgr [phone] 03/13/2016, 12:11 PM

## 2016-03-13 NOTE — Significant Event (Signed)
Patient was evaluated due to loss of consciousness and respiratory failure.  Patient reported to have a blurry vision prioor to losing consciousness.  When I first saw the patient, she was intubated and unconscious.  Examination was significant for a systolic blood pressure of 76 mmHg on the left wrist due to weight.  Cardiac examination was significant for 3/6 diastolic murmur.  Patient was not responsive to stimuli and pupils were constricted and not responsive to light.  Labs and ECG were pending at the time of my assessment.  Patient did eventually gain consciousness during her evaluation for which she will receive some sedation while intubated.  Arterial line was placed and patient had a systolic pressure of 123456 mmHg initially.  Plan: - Follow up code labs - Stat head CT - Stat chest x-ray - Wean off of levophed - Will continue to monitor

## 2016-03-13 NOTE — Progress Notes (Signed)
Patient ID: Robyn Porter, female   DOB: May 03, 1950, 65 y.o.   MRN: [id number]    Advanced Heart Failure Rounding Note   Subjective:    Admitted with marked volume overload. Sluggish diuresis despite addition of milrinone and switch to lasix gtt.   Renal consulted 03/04/16 with poor diuresis.  Pt placed on CRRT.   CVP 9.  Continues on CVVHD. Weight down 125 pounds total. Off milrinone  SBP  80-120s. 35 cc urine output.    Complaining of R upper thigh pain. Pain started after she completed physical therapy.   Denies SOB.   Echo: EF 60-65%, severe LVH, moderate AS with mean gradient 32 mmHg, RV not well visualized.   Objective:   Weight Range:  Vital Signs:   Temp:  [97.3 F (36.3 C)-98.2 F (36.8 C)] 97.4 F (36.3 C) (11/16 0815) Pulse Rate:  [62-63] 62 (11/16 0442) Resp:  [10-19] 10 (11/16 0800) BP: (114-120)/(39-87) 117/87 (11/16 0800) SpO2:  [99 %-100 %] 99 % (11/16 0800) Arterial Line BP: (68-132)/(30-74) 126/63 (11/16 0600) Weight:  [277 lb 1.6 oz (125.7 kg)] 277 lb 1.6 oz (125.7 kg) (11/16 0442) Last BM Date: 03/11/16  Weight change: Filed Weights   03/11/16 0637 03/12/16 0500 03/13/16 0442  Weight: 299 lb (135.6 kg) 289 lb 12.8 oz (131.5 kg) 277 lb 1.6 oz (125.7 kg)    Intake/Output:   Intake/Output Summary (Last 24 hours) at 03/13/16 0830 Last data filed at 03/13/16 0800  Gross per 24 hour  Intake              250 ml  Output             8406 ml  Net            -8156 ml     Physical Exam: CVP 9 General:  Chronically ill appearing.  In the chair . CVVHD ongoing. HEENT: Normal Neck: supple. JVP ~10 elevated. Carotids 2+ bilat; no bruits. No thyromegaly or lymphadenopathy noted.    Cor: PMI nondisplaced. Regular. 3/6 systolic crescendo-decrescendo murmur RUSB.   Lungs: Distant, diminished basilar sounds.  Abdomen: obese, soft, breakthrough bleeding  Extremities: no cyanosis, clubbing, rash, Chronic 1+ edema into thighs. RIJ Temp dialysis cath. R thigh  tender to touch.  Neuro: alert & orientedx3, cranial nerves grossly intact. moves all 4 extremities w/o difficulty. Affect pleasant  Telemetry: Reviewed, afib PVCs under V-pacing.   Labs: Basic Metabolic Panel:  Recent Labs Lab 03/09/16 0615  03/10/16 0445  03/11/16 0449 03/11/16 1545 03/12/16 0414 03/12/16 1615 03/13/16 0410  NA 134*  134*  < > 133*  < > 133* 130* 132* 130* 133*  K 3.9  3.9  < > 3.8  < > 4.4 4.3 4.3 4.3 4.6  CL 102  101  < > 101  < > 101 98* 100* 98* 100*  CO2 26  26  < > 23  < > 26 22 24 24 27   GLUCOSE 110*  100*  < > 97  < > 95 103* 83 101* 96  BUN 17  18  < > 17  < > 17 19 17 14 17   CREATININE 1.77*  1.77*  < > 1.85*  < > 2.01* 2.17* 1.98* 2.14* 2.11*  CALCIUM 8.5*  8.4*  < > 8.6*  < > 8.8* 9.0 8.9 9.0 9.3  MG 2.3  --  2.4  --  2.4  --  2.4  --  2.4  PHOS 2.1*  < > 2.3*  < >  2.9 3.2 3.4 3.3 4.1  < > = values in this interval not displayed.  Liver Function Tests:  Recent Labs Lab 03/11/16 0449 03/11/16 1545 03/12/16 0414 03/12/16 1615 03/13/16 0410  ALBUMIN 2.6* 2.7* 2.6* 2.6* 2.7*   No results for input(s): LIPASE, AMYLASE in the last 168 hours. No results for input(s): AMMONIA in the last 168 hours.  CBC:  Recent Labs Lab 03/09/16 0615 03/10/16 0445 03/11/16 0447 03/12/16 0414 03/13/16 0410  WBC 8.2 7.4 8.2 7.5 9.0  NEUTROABS 5.7 4.8 5.2 4.7 5.5  HGB 8.3* 8.8* 9.2* 9.2* 9.7*  HCT 27.3* 28.0* 30.2* 31.1* 31.9*  MCV 102.2* 102.2* 103.1* 102.3* 101.3*  PLT 132* 115* 103* 106* 95*    Cardiac Enzymes: No results for input(s): CKTOTAL, CKMB, CKMBINDEX, TROPONINI in the last 168 hours.  BNP: BNP (last 3 results)  Recent Labs  07/09/15 1158 07/19/15 1124 03/19/2016 1754  BNP 133.0* 149.0* 429.4*    ProBNP (last 3 results) No results for input(s): PROBNP in the last 8760 hours.    Other results:  Imaging: No results found.   Medications:     Scheduled Medications: . allopurinol  50 mg Oral Daily  .  atorvastatin  20 mg Oral q1800  . cholecalciferol  5,000 Units Oral Daily  . darbepoetin (ARANESP) injection - NON-DIALYSIS  100 mcg Subcutaneous Q Wed-1800  . guaiFENesin  600 mg Oral BID  . loratadine  10 mg Oral Daily  . mouth rinse  15 mL Mouth Rinse BID  . midodrine  5 mg Oral TID WC  . mometasone-formoterol  2 puff Inhalation BID  . multivitamin with minerals  1 tablet Oral Daily  . sodium chloride flush  10-40 mL Intracatheter Q12H  . cyanocobalamin  500 mcg Oral Daily  . vitamin C  500 mg Oral BID    Infusions: . heparin 10,000 units/ 20 mL infusion syringe 1,100 Units/hr (03/13/16 0820)  . dialysis replacement fluid (prismasate) 300 mL/hr at 03/12/16 2031  . dialysis replacement fluid (prismasate) 200 mL/hr at 03/12/16 1635  . dialysate (PRISMASATE) 1,000 mL/hr at 03/13/16 0650    PRN Medications: Place/Maintain arterial line **AND** sodium chloride, acetaminophen, albuterol, benzonatate, chlorpheniramine-HYDROcodone, guaiFENesin-dextromethorphan, heparin, heparin, heparin, HYDROcodone-acetaminophen, ipratropium-albuterol, ondansetron (ZOFRAN) IV, sodium chloride flush   Assessment/Plan/Discussion    Rilya Pownell is a 65 y.o. female with history with chronic systolic CHF, Anemia, OHS/OSA, symptomatic bradycardia s/p MDT PPm with CRT-P upgrade given high percentage of RV pacing, CKD stage IV.  Admitted with marked volume overload.  1. Chronic diastolic CHF: EF [id number] on echo this admission, RV poorly visualized. Recent upgrade to Med[employer] CRT given constant RV pacing. Volume status markedly elevated. Weight up 30 lbs since August.  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  Renal function stable.  Milrinone begun for RV support.   Had to start CVVH for volume removal given diuretic failure. Now on midodrine, with arterial line in place able to come off norepinephrine.  CVP 9. Volume status improved.  Overall weight down 125 pounds.  Off milrinone, co-ox  69%.   - Continue CVVH. Pulling ~250-300 cc/hr this am.   - Continue midodrine to 5 mg three time a day.  - Ongoing discussion about need for IHD (suspect she will need). Will need to observe for renal recovery once CRRT completed.  - Off BB with low coox on admission.  2. Symptomatic bradycardia: MDT PPM. Upgraded to CRT given high percentage RV pacing.  3. ARF on  CKD Stage IV/V: Remains on CVVHD, UF at 250-300 cc/hr. 4. Chronic atrial fibrillation:  - Hold Toprol XL for now with low output.  HR ok.  - On heparin gtt while getting CVVH.   5. OHS/OSA: Continue nightly CPAP.  6. 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 Given 1UPRBC on admit.  - States she has occasional hemorrhoidal bleed - Has been followed by Dr. Lindi Adie who is working up anemia - Hgb 8.4 -> 7.6 -> 8.2 -> 8.4 -> 8.2 -> 8.7 => 8.3=>8.8 =>9.2=>9.7 . Continue to follow.  7. Chronic venous stasis: Chronic changes, but stable. No cellulitis appreciated.   8. Morbid obesity: PT following. Recommending HHPT vs SNF.   9. Hypokalemia: Stable on CVVHD.   10. Aortic stenosis:  Moderate by echo 03/02/16.  Muffled S2 concerning that AS could be worse.  Would consider evaluation via TEE eventually => prior to discharge.   Length of Stay: [place], NP-C  03/13/2016, 8:30 AM  Advanced Heart Failure Team Pager [phone] (M-F; [place])  Please contact [facility] for night-coverage after hours (4p -7a ) and weekends on [email or web address]  Patient seen with NP, agree with the above note.  Plan to continue CVVH today, hemodynamically stable.  Hopefully can stop by Friday.  I suspect she'll end up needing intermittent HD for volume management.   [name] 03/13/2016 11:34 AM

## 2016-03-13 NOTE — Consult Note (Signed)
PULMONARY / CRITICAL CARE MEDICINE   Name: Robyn Porter MRN: [id number] DOB: 14-Nov-1950    ADMISSION DATE:  03/26/2016 CONSULTATION DATE: 03/13/16  REFERRING MD:  Haroldine Laws, D  CHIEF COMPLAINT:  Respiratory arrest  HISTORY OF PRESENT ILLNESS:   Robyn Porter is a 65 y.o. female chronic dCHF, AS, chronic afib on Xarelto (at home), chronic anemia due to renal failure, Hx of GI bleeds, and OHS/OSA who was admitted with marked fluid overload two weeks ago. She failed diuresis with lasix gtt and started on CRRT by nephrology starting 03/04/2016. Weight down by 113 pounds over this admission. Most recent vital sign was significant for 80/44. Bmet was significant for HypoNa to 132 and sCr of 2.18. Last CXR was on 11/07 and significant for pulm edema. She hasn't had EKG or troponin this admission. She has received 2 tablets of Vicodin 5/325 mg x4 today. Last dose was about 4 hours ago.  This evening,  patient developed gradual worsening dyspnea with respiratory arrest and code blue was activated. Patient was intubated and started on full vent.   PAST MEDICAL HISTORY :  She  has a past medical history of Afib ([place]); Aortic stenosis; CHF (congestive heart failure) ([place]); Chronic kidney disease; COPD (chronic obstructive pulmonary disease) ([place]); Diabetes mellitus without complication ([place]); Hypertension; and Pacemaker.  PAST SURGICAL HISTORY: She  has a past surgical history that includes Pacemaker insertion; Abdominal hysterectomy; Colonoscopy (N/A, 06/29/2015); Biv pacemaker generator change out (09/19/2015); Cardiac catheterization (N/A, 09/19/2015); Esophagogastroduodenoscopy (N/A, 11/29/2015); Givens capsule study (N/A, 11/30/2015); Colonoscopy (N/A, 12/03/2015); and Hot hemostasis (N/A, 12/03/2015).  Allergies  Allergen Reactions  . Adhesive [Tape] Other (See Comments)    Tears skin.  Please use "paper" tape  . Amoxicillin Hives  . Contrast Media [Iodinated Diagnostic Agents] Hives  .  Shellfish-Derived Products Hives    Can eat crab cakes  . Benadryl [Diphenhydramine Hcl] Rash    No current facility-administered medications on file prior to encounter.    Current Outpatient Prescriptions on File Prior to Encounter  Medication Sig  . acetaminophen (TYLENOL) 325 MG tablet Take 1-2 tablets (325-650 mg total) by mouth every 6 (six) hours as needed for mild pain.  Marland Kitchen albuterol (PROVENTIL HFA;VENTOLIN HFA) 108 (90 Base) MCG/ACT inhaler Inhale 1 puff into the lungs every 6 (six) hours as needed for wheezing or shortness of breath.  Marland Kitchen albuterol (PROVENTIL) (2.5 MG/3ML) 0.083% nebulizer solution Take 2.5 mg by nebulization every 8 (eight) hours as needed for wheezing.   Marland Kitchen allopurinol (ZYLOPRIM) 100 MG tablet Take 50 mg by mouth daily.   Marland Kitchen atorvastatin (LIPITOR) 20 MG tablet Take 20 mg by mouth daily.  . budesonide-formoterol (SYMBICORT) 80-4.5 MCG/ACT inhaler Inhale 2 puffs into the lungs 2 (two) times daily.  . Cholecalciferol (VITAMIN D3) 5000 units CAPS Take 1 capsule by mouth daily.  . cyanocobalamin 500 MCG tablet Take 500 mcg by mouth daily.  . Exenatide ER (BYDUREON) 2 MG PEN Inject 2 mg into the skin every Saturday. Take every Saturday. Patient states she taken today (02/27/2016) because she forgot to take it this past Saturday  . ferrous sulfate 325 (65 FE) MG tablet Take 1 tablet (325 mg total) by mouth 3 (three) times daily with meals.  Marland Kitchen ipratropium-albuterol (DUONEB) 0.5-2.5 (3) MG/3ML SOLN Inhale 3 mLs into the lungs every 6 (six) hours as needed (for shortness of breath).   . loratadine (CLARITIN) 10 MG tablet Take 10 mg by mouth daily.  . metolazone (ZAROXOLYN) 2.5 MG tablet Take 1 tablet (  2.5 mg total) by mouth as needed (3 lb weight gain overnight or 5lb week gain in a week). As directed by HF clinic.  . metoprolol succinate (TOPROL-XL) 25 MG 24 hr tablet Take 1 tablet (25 mg total) by mouth daily.  . Multiple Vitamin (MULTIVITAMIN WITH MINERALS) TABS tablet Take 1  tablet by mouth daily. Reported on 07/09/2015  . potassium chloride SA (K-DUR,KLOR-CON) 20 MEQ tablet Take 2 tablets (40 mEq total) by mouth 2 (two) times daily. Take an extra 40 meq when you take Metolazone  . torsemide (DEMADEX) 20 MG tablet Take 4 tablets (80 mg total) by mouth 2 (two) times daily.  . vitamin C (ASCORBIC ACID) 500 MG tablet Take 1 tablet (500 mg total) by mouth 2 (two) times daily.  Alveda Reasons 15 MG TABS tablet TAKE 1 TABLET BY MOUTH EVERY DAY WITH SUPPER    FAMILY HISTORY:  Her indicated that her mother is deceased. She indicated that her father is deceased. She indicated that the status of her sister is unknown. She indicated that the status of her brother is unknown.    SOCIAL HISTORY: She  reports that she is a non-smoker but has been exposed to tobacco smoke. She has never used smokeless tobacco. She reports that she does not drink alcohol or use drugs.  REVIEW OF SYSTEMS:     SUBJECTIVE:  Sedated and intubated  VITAL SIGNS: BP 109/62 (BP Location: Right Wrist)   Pulse 62   Temp 97.5 F (36.4 C) (Oral)   Resp 15   Ht 5\' 4"  (1.626 m)   Wt 277 lb 1.6 oz (125.7 kg)   SpO2 100%   BMI 47.56 kg/m   HEMODYNAMICS: CVP:  [4 mmHg-22 mmHg] 22 mmHg  VENTILATOR SETTINGS:    INTAKE / OUTPUT: I/O last 3 completed shifts: In: 9 [P.O.:670] Out: [id number] [Urine:85; Other:11610]  PHYSICAL EXAMINATION: General:  Obese, lying in bed Neuro:Sedated and intubated. Not arousable, no facial drooping.  HEENT:  Normocephalic, ETT in place, pupils equal, ~3 mm, minimally reactive to light Cardiovascular: RRR, 3/6 SEM over R and LUSB Lungs: intubated, air movement bilaterally, no wheeze or crackles Abdomen: obese, soft, limited exam Musculoskeletal: lipidemia  Skin: dry scaly skin in LE  LABS:  BMET  Recent Labs Lab 03/12/16 1615 03/13/16 0410 03/13/16 1623  NA 130* 133* 132*  K 4.3 4.6 4.9  CL 98* 100* 100*  CO2 24 27 25   BUN 14 17 17   CREATININE 2.14*  2.11* 2.18*  GLUCOSE 101* 96 121*    Electrolytes  Recent Labs Lab 03/11/16 0449  03/12/16 0414 03/12/16 1615 03/13/16 0410 03/13/16 1623  CALCIUM 8.8*  < > 8.9 9.0 9.3 9.5  MG 2.4  --  2.4  --  2.4  --   PHOS 2.9  < > 3.4 3.3 4.1 4.7*  < > = values in this interval not displayed.  CBC  Recent Labs Lab 03/11/16 0447 03/12/16 0414 03/13/16 0410  WBC 8.2 7.5 9.0  HGB 9.2* 9.2* 9.7*  HCT 30.2* 31.1* 31.9*  PLT 103* 106* 95*    Coag's  Recent Labs Lab 03/11/16 0447 03/12/16 0414 03/13/16 0410  APTT >200* 189* >200*    Sepsis Markers No results for input(s): LATICACIDVEN, PROCALCITON, O2SATVEN in the last 168 hours.  ABG No results for input(s): PHART, PCO2ART, PO2ART in the last 168 hours.  Liver Enzymes  Recent Labs Lab 03/12/16 1615 03/13/16 0410 03/13/16 1623  ALBUMIN 2.6* 2.7* 2.7*    Cardiac Enzymes  No results for input(s): TROPONINI, PROBNP in the last 168 hours.  Glucose No results for input(s): GLUCAP in the last 168 hours.  Imaging No results found.  11/07 CXR with edema STUDIES:    CULTURES: None  ANTIBIOTICS: None  SIGNIFICANT EVENTS: 11/01: admitted due to marked fluid overload 11/07: CRRT started 11/16: respiratory arrest  LINES/TUBES: 03/04/2016: HD cath in Rt IJ 03/01/2016: Foley cath 03/13/2016: PIV 03/03/2016: A-line  DISCUSSION: Robyn Porter is a 65 y.o. female chronic dCHF, AS, chronic afib on Xarelto (at home, not on anticoag here except with CRRT), CKD-4, Hx of GI bleeds, and OHS/OSA who was admitted with marked fluid overload requiring CRRT. Patient developed progressive dyspnea leading to respiratory arrest. Differential diagnosis are CVA (hx of Afib but PTT>200), PE unlikely with high PTT, Opiate (on high dose norco here), ACS and Pneumonia  ASSESSMENT / PLAN:  PULMONARY A: VDRF - following syncope with subsequent respiratory arrest.  Unclear etiology at this point.  Doubt PE given chronic  anticoagulation for A.fib (xarelto) and PTT > 200. Pulmonary edema. 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 P:   Full vent support. Wean as able. VAP prevention measures. SBT in AM if able. DuoNebs. Budesonide / Brovana in lieu of preadmission symbicort. Diuresis restricted due to hypotension. CXR in AM.  NEUROLOGIC A:   Syncope - unclear etiology at this point. ?opiate Acute encephalopathy - due to chemical sedation. P:   Assess STAT head CT. Sedation:  Fentanyl gtt / Midazolam PRN. RASS goal: 0 to -1. Daily WUA. Limit sedating meds.  CARDIOVASCULAR A:  Hypotension - concern cardiogenic shock. Hx dCHF (Echo from 11/5 with EF 60-65%, severe LVH), A.fib (on chronic xarelto), HTN, AS, bradycardia s/p PPM. P:  Restart levophed, goal MAP > 65. Follow CVP's, co-ox. Assess troponin, lactate. Continue midodrine. Start heparin gtt - if head CT negative for bleed. Continue preadmission atorvastatin. Hold preadmission metolazone, toprol-xl, torsemide, xarelto.  RENAL A:   CKD - started on CRRT and will likely progress to needing full HD. Hyponatremia - hypervolemic. P:   Continue CRRT per nephrology. BMP in AM.  GASTROINTESTINAL A:   GI prophylaxis. Nutrition. Obesity. P: SUP: Pantoprazole. NPO. Nutrition consult prior to d/c for weight loss education, etc.  HEMATOLOGIC A:   Anemia. On chronic anticoagulation for A.fib (xarelto). VTE Prophylaxis. P:  Transfuse for Hgb < 7. Monitor platelet counts. Start heparin gtt - if head CT negative for bleed. SCD's / heparin gtt. CBC in AM.  INFECTIOUS A:   No indication of infection. P:   Monitor clinically.  ENDOCRINE A:   No acute issues. P:   No interventions required.  Family updated: Multiple family members updated at bedside.  Interdisciplinary Family Meeting v Palliative Care Meeting:  Due by: 03/21/16.  CC time: 40  minutes.   Robyn Porter, [place] [facility] Pager: [phone]  or [phone] 03/13/2016, 9:38 PM

## 2016-03-13 NOTE — Progress Notes (Signed)
LCSW following for disposition home vs SNF.  Patient has been worked up for SNF but at time of assessment she was uncertain about her plans. She has been faxed out and has bed offers, but more information will need to be sent for bed offers as she may have long term changes to her care.  LCSW remains involved and will assist with discharge planning and present bed offers vs assisting with getting her home safely.  If needs arise, please call. Lane Hacker, MSW Clinical Social Work: [employer] Coverage for :  [phone]

## 2016-03-13 NOTE — Progress Notes (Signed)
PTT >200, results same from pervious am labs. Will pass information on to am nurse.

## 2016-03-13 NOTE — Telephone Encounter (Signed)
lvm to inform pt of 12/4 appt date/time per LOS

## 2016-03-13 NOTE — Progress Notes (Signed)
Transported pt to CT on ventilator with RN no issues to report.

## 2016-03-14 ENCOUNTER — Inpatient Hospital Stay (HOSPITAL_COMMUNITY): Payer: Medicare Other

## 2016-03-14 DIAGNOSIS — R570 Cardiogenic shock: Secondary | ICD-10-CM

## 2016-03-14 DIAGNOSIS — E861 Hypovolemia: Secondary | ICD-10-CM

## 2016-03-14 DIAGNOSIS — R579 Shock, unspecified: Secondary | ICD-10-CM

## 2016-03-14 DIAGNOSIS — J9601 Acute respiratory failure with hypoxia: Secondary | ICD-10-CM

## 2016-03-14 DIAGNOSIS — R55 Syncope and collapse: Secondary | ICD-10-CM

## 2016-03-14 DIAGNOSIS — Z452 Encounter for adjustment and management of vascular access device: Secondary | ICD-10-CM

## 2016-03-14 DIAGNOSIS — N179 Acute kidney failure, unspecified: Secondary | ICD-10-CM

## 2016-03-14 LAB — LACTIC ACID, PLASMA
LACTIC ACID, VENOUS: 5.3 mmol/L — AB (ref 0.5–1.9)
Lactic Acid, Venous: 2.4 mmol/L (ref 0.5–1.9)
Lactic Acid, Venous: 7.8 mmol/L (ref 0.5–1.9)

## 2016-03-14 LAB — BASIC METABOLIC PANEL
Anion gap: 13 (ref 5–15)
Anion gap: 17 — ABNORMAL HIGH (ref 5–15)
Anion gap: 17 — ABNORMAL HIGH (ref 5–15)
BUN: 22 mg/dL — ABNORMAL HIGH (ref 6–20)
BUN: 23 mg/dL — AB (ref 6–20)
BUN: 24 mg/dL — AB (ref 6–20)
CALCIUM: 8.8 mg/dL — AB (ref 8.9–10.3)
CHLORIDE: 101 mmol/L (ref 101–111)
CO2: 16 mmol/L — ABNORMAL LOW (ref 22–32)
CO2: 11 mmol/L — AB (ref 22–32)
CO2: 10 mmol/L — AB (ref 22–32)
CREATININE: 3.74 mg/dL — AB (ref 0.44–1.00)
Calcium: 9.3 mg/dL (ref 8.9–10.3)
Calcium: 9.5 mg/dL (ref 8.9–10.3)
Chloride: 103 mmol/L (ref 101–111)
Chloride: 103 mmol/L (ref 101–111)
Creatinine, Ser: 3.15 mg/dL — ABNORMAL HIGH (ref 0.44–1.00)
Creatinine, Ser: 3.5 mg/dL — ABNORMAL HIGH (ref 0.44–1.00)
GFR calc Af Amer: 15 mL/min — ABNORMAL LOW (ref >60)
GFR calc non Af Amer: 14 mL/min — ABNORMAL LOW (ref >60)
GFR calc non Af Amer: 12 mL/min — ABNORMAL LOW (ref >60)
GFR, EST AFRICAN AMERICAN: 17 mL/min — AB (ref >60)
GFR, EST AFRICAN AMERICAN: 14 mL/min — AB (ref >60)
GFR, EST NON AFRICAN AMERICAN: 13 mL/min — AB (ref >60)
GLUCOSE: 41 mg/dL — AB (ref 65–99)
GLUCOSE: 116 mg/dL — AB (ref 65–99)
Glucose, Bld: 122 mg/dL — ABNORMAL HIGH (ref 65–99)
POTASSIUM: 5.7 mmol/L — AB (ref 3.5–5.1)
POTASSIUM: 6.3 mmol/L — AB (ref 3.5–5.1)
Potassium: 5.3 mmol/L — ABNORMAL HIGH (ref 3.5–5.1)
SODIUM: 130 mmol/L — AB (ref 135–145)
Sodium: 131 mmol/L — ABNORMAL LOW (ref 135–145)
Sodium: 130 mmol/L — ABNORMAL LOW (ref 135–145)

## 2016-03-14 LAB — COOXEMETRY PANEL
Carboxyhemoglobin: 1.8 % — ABNORMAL HIGH (ref 0.5–1.5)
Carboxyhemoglobin: 1.4 % (ref 0.5–1.5)
Methemoglobin: 0.8 % (ref 0.0–1.5)
Methemoglobin: 0.6 % (ref 0.0–1.5)
O2 SAT: 58.1 %
O2 Saturation: 61.2 %
TOTAL HEMOGLOBIN: 9.9 g/dL — AB (ref 12.0–16.0)
Total hemoglobin: 8.2 g/dL — ABNORMAL LOW (ref 12.0–16.0)

## 2016-03-14 LAB — PROTIME-INR
INR: 3.23
Prothrombin Time: 33.7 seconds — ABNORMAL HIGH (ref 11.4–15.2)

## 2016-03-14 LAB — CBC
HCT: 27.1 % — ABNORMAL LOW (ref 36.0–46.0)
HEMATOCRIT: 23.7 % — AB (ref 36.0–46.0)
HEMOGLOBIN: 6.9 g/dL — AB (ref 12.0–15.0)
Hemoglobin: 7.9 g/dL — ABNORMAL LOW (ref 12.0–15.0)
MCH: 30.6 pg (ref 26.0–34.0)
MCH: 31.2 pg (ref 26.0–34.0)
MCHC: 29.2 g/dL — ABNORMAL LOW (ref 30.0–36.0)
MCHC: 29.1 g/dL — AB (ref 30.0–36.0)
MCV: 105 fL — AB (ref 78.0–100.0)
MCV: 107.2 fL — ABNORMAL HIGH (ref 78.0–100.0)
PLATELETS: 120 10*3/uL — AB (ref 150–400)
Platelets: 109 10*3/uL — ABNORMAL LOW (ref 150–400)
RBC: 2.58 MIL/uL — AB (ref 3.87–5.11)
RBC: 2.21 MIL/uL — AB (ref 3.87–5.11)
RDW: 17.8 % — AB (ref 11.5–15.5)
RDW: 17.8 % — ABNORMAL HIGH (ref 11.5–15.5)
WBC: 28.7 10*3/uL — ABNORMAL HIGH (ref 4.0–10.5)
WBC: 34.4 10*3/uL — ABNORMAL HIGH (ref 4.0–10.5)

## 2016-03-14 LAB — RENAL FUNCTION PANEL
ALBUMIN: 2.6 g/dL — AB (ref 3.5–5.0)
ANION GAP: 12 (ref 5–15)
Albumin: 1.7 g/dL — ABNORMAL LOW (ref 3.5–5.0)
Anion gap: 14 (ref 5–15)
BUN: 20 mg/dL (ref 6–20)
BUN: 23 mg/dL — ABNORMAL HIGH (ref 6–20)
CALCIUM: 9.1 mg/dL (ref 8.9–10.3)
CHLORIDE: 106 mmol/L (ref 101–111)
CO2: 16 mmol/L — ABNORMAL LOW (ref 22–32)
CO2: 13 mmol/L — AB (ref 22–32)
CREATININE: 3.38 mg/dL — AB (ref 0.44–1.00)
Calcium: 8.4 mg/dL — ABNORMAL LOW (ref 8.9–10.3)
Chloride: 100 mmol/L — ABNORMAL LOW (ref 101–111)
Creatinine, Ser: 2.75 mg/dL — ABNORMAL HIGH (ref 0.44–1.00)
GFR, EST AFRICAN AMERICAN: 20 mL/min — AB (ref >60)
GFR, EST AFRICAN AMERICAN: 15 mL/min — AB (ref >60)
GFR, EST NON AFRICAN AMERICAN: 17 mL/min — AB (ref >60)
GFR, EST NON AFRICAN AMERICAN: 13 mL/min — AB (ref >60)
Glucose, Bld: 164 mg/dL — ABNORMAL HIGH (ref 65–99)
Glucose, Bld: 70 mg/dL (ref 65–99)
PHOSPHORUS: 7 mg/dL — AB (ref 2.5–4.6)
POTASSIUM: 5.3 mmol/L — AB (ref 3.5–5.1)
Phosphorus: 9.3 mg/dL — ABNORMAL HIGH (ref 2.5–4.6)
Potassium: 5.3 mmol/L — ABNORMAL HIGH (ref 3.5–5.1)
SODIUM: 128 mmol/L — AB (ref 135–145)
Sodium: 133 mmol/L — ABNORMAL LOW (ref 135–145)

## 2016-03-14 LAB — CBC WITH DIFFERENTIAL/PLATELET
Basophils Absolute: 0 10*3/uL (ref 0.0–0.1)
Basophils Relative: 0 %
EOS ABS: 0 10*3/uL (ref 0.0–0.7)
Eosinophils Relative: 0 %
HEMATOCRIT: 27.9 % — AB (ref 36.0–46.0)
HEMOGLOBIN: 8.5 g/dL — AB (ref 12.0–15.0)
LYMPHS PCT: 9 %
Lymphs Abs: 1.7 10*3/uL (ref 0.7–4.0)
MCH: 31 pg (ref 26.0–34.0)
MCHC: 30.5 g/dL (ref 30.0–36.0)
MCV: 101.8 fL — ABNORMAL HIGH (ref 78.0–100.0)
MONOS PCT: 10 %
Monocytes Absolute: 1.9 10*3/uL — ABNORMAL HIGH (ref 0.1–1.0)
NEUTROS ABS: 15.2 10*3/uL — AB (ref 1.7–7.7)
Neutrophils Relative %: 81 %
Platelets: 120 10*3/uL — ABNORMAL LOW (ref 150–400)
RBC: 2.74 MIL/uL — ABNORMAL LOW (ref 3.87–5.11)
RDW: 17.9 % — ABNORMAL HIGH (ref 11.5–15.5)
WBC: 18.8 10*3/uL — ABNORMAL HIGH (ref 4.0–10.5)

## 2016-03-14 LAB — POCT I-STAT 3, ART BLOOD GAS (G3+)
ACID-BASE DEFICIT: 18 mmol/L — AB (ref 0.0–2.0)
BICARBONATE: 10 mmol/L — AB (ref 20.0–28.0)
O2 SAT: 99 %
PO2 ART: 145 mmHg — AB (ref 83.0–108.0)
Patient temperature: 97.4
TCO2: 11 mmol/L (ref 0–100)
pCO2 arterial: 29.7 mmHg — ABNORMAL LOW (ref 32.0–48.0)
pH, Arterial: 7.133 — CL (ref 7.350–7.450)

## 2016-03-14 LAB — BLOOD GAS, ARTERIAL
Acid-base deficit: 17.5 mmol/L — ABNORMAL HIGH (ref 0.0–2.0)
Bicarbonate: 10.2 mmol/L — ABNORMAL LOW (ref 20.0–28.0)
FIO2: 40
LHR: 14 {breaths}/min
O2 SAT: 98.7 %
PCO2 ART: 33.7 mmHg (ref 32.0–48.0)
PEEP: 5 cmH2O
PO2 ART: 109 mmHg — AB (ref 83.0–108.0)
Patient temperature: 98.6
VT: 420 mL
pH, Arterial: 7.107 — CL (ref 7.350–7.450)

## 2016-03-14 LAB — MAGNESIUM: Magnesium: 2.5 mg/dL — ABNORMAL HIGH (ref 1.7–2.4)

## 2016-03-14 LAB — APTT
APTT: 56 s — AB (ref 24–36)
APTT: 86 s — AB (ref 24–36)

## 2016-03-14 LAB — PREPARE RBC (CROSSMATCH)

## 2016-03-14 LAB — CG4 I-STAT (LACTIC ACID): LACTIC ACID, VENOUS: 10.11 mmol/L — AB (ref 0.5–1.9)

## 2016-03-14 LAB — HEPARIN LEVEL (UNFRACTIONATED): Heparin Unfractionated: 0.33 IU/mL (ref 0.30–0.70)

## 2016-03-14 LAB — GLUCOSE, CAPILLARY
GLUCOSE-CAPILLARY: 112 mg/dL — AB (ref 65–99)
GLUCOSE-CAPILLARY: 91 mg/dL (ref 65–99)

## 2016-03-14 LAB — D-DIMER, QUANTITATIVE (NOT AT ARMC): D DIMER QUANT: 1.89 ug{FEU}/mL — AB (ref 0.00–0.50)

## 2016-03-14 LAB — FIBRINOGEN: Fibrinogen: 313 mg/dL (ref 210–475)

## 2016-03-14 LAB — CORTISOL: CORTISOL PLASMA: 35.9 ug/dL

## 2016-03-14 MED ORDER — DEXTROSE 5 % IV SOLN
500.0000 mg | INTRAVENOUS | Status: DC
  Filled 2016-03-14: qty 0.5

## 2016-03-14 MED ORDER — PRISMASOL BGK 0/2.5 32-2.5 MEQ/L IV SOLN
INTRAVENOUS | Status: DC
  Administered 2016-03-14 – 2016-03-16 (×3): via INTRAVENOUS_CENTRAL
  Filled 2016-03-14 (×5): qty 5000

## 2016-03-14 MED ORDER — SODIUM BICARBONATE 8.4 % IV SOLN
50.0000 meq | Freq: Once | INTRAVENOUS | Status: AC
  Administered 2016-03-14: 50 meq via INTRAVENOUS

## 2016-03-14 MED ORDER — SODIUM BICARBONATE 8.4 % IV SOLN
50.0000 meq | Freq: Once | INTRAVENOUS | Status: AC
Start: 2016-03-14 — End: 2016-03-14
  Administered 2016-03-14: 50 meq via INTRAVENOUS
  Filled 2016-03-14: qty 50

## 2016-03-14 MED ORDER — DEXTROSE 5 % IV SOLN
INTRAVENOUS | Status: DC
  Administered 2016-03-14: 15:00:00 via INTRAVENOUS
  Filled 2016-03-14 (×2): qty 150

## 2016-03-14 MED ORDER — SODIUM BICARBONATE 8.4 % IV SOLN
INTRAVENOUS | Status: AC
  Filled 2016-03-14: qty 100

## 2016-03-14 MED ORDER — VANCOMYCIN HCL 10 G IV SOLR
1250.0000 mg | INTRAVENOUS | Status: DC
  Administered 2016-03-15: 1250 mg via INTRAVENOUS
  Filled 2016-03-14 (×2): qty 1250

## 2016-03-14 MED ORDER — DEXTROSE 5 % IV SOLN: 2.0000 g | INTRAVENOUS | Status: DC

## 2016-03-14 MED ORDER — SODIUM CHLORIDE 0.9 % IV BOLUS (SEPSIS)
250.0000 mL | Freq: Once | INTRAVENOUS | Status: AC
  Administered 2016-03-14: 250 mL via INTRAVENOUS

## 2016-03-14 MED ORDER — SODIUM CHLORIDE 0.9 % IV BOLUS (SEPSIS)
1000.0000 mL | Freq: Once | INTRAVENOUS | Status: AC
  Administered 2016-03-14: 1000 mL via INTRAVENOUS

## 2016-03-14 MED ORDER — CHLORHEXIDINE GLUCONATE 0.12% ORAL RINSE (MEDLINE KIT): 15.0000 mL | Freq: Two times a day (BID) | OROMUCOSAL | Status: DC

## 2016-03-14 MED ORDER — PRISMASOL BGK 0/2.5 32-2.5 MEQ/L IV SOLN
INTRAVENOUS | Status: DC
  Administered 2016-03-14 – 2016-03-15 (×2): via INTRAVENOUS_CENTRAL
  Filled 2016-03-14 (×5): qty 5000

## 2016-03-14 MED ORDER — CHLORHEXIDINE GLUCONATE 0.12% ORAL RINSE (MEDLINE KIT)
15.0000 mL | Freq: Two times a day (BID) | OROMUCOSAL | Status: DC
  Administered 2016-03-14 – 2016-03-15 (×3): 15 mL via OROMUCOSAL

## 2016-03-14 MED ORDER — DEXTROSE 5 % IV SOLN
2.0000 g | Freq: Two times a day (BID) | INTRAVENOUS | Status: DC
  Administered 2016-03-15 – 2016-03-16 (×3): 2 g via INTRAVENOUS
  Filled 2016-03-14 (×5): qty 2

## 2016-03-14 MED ORDER — DEXTROSE 50 % IV SOLN
INTRAVENOUS | Status: AC
  Filled 2016-03-14: qty 50

## 2016-03-14 MED ORDER — DOBUTAMINE IN D5W 4-5 MG/ML-% IV SOLN
2.0000 ug/kg/min | INTRAVENOUS | Status: DC
  Administered 2016-03-14: 2 ug/kg/min via INTRAVENOUS
  Filled 2016-03-14: qty 250

## 2016-03-14 MED ORDER — DEXTROSE 50 % IV SOLN
1.0000 | Freq: Once | INTRAVENOUS | Status: AC
  Administered 2016-03-14: 50 mL via INTRAVENOUS

## 2016-03-14 MED ORDER — SODIUM CHLORIDE 0.9 % IV SOLN: Freq: Once | INTRAVENOUS | Status: DC

## 2016-03-14 MED ORDER — HEPARIN (PORCINE) IN NACL 100-0.45 UNIT/ML-% IJ SOLN
1200.0000 [IU]/h | INTRAMUSCULAR | Status: DC
  Administered 2016-03-14: 1200 [IU]/h via INTRAVENOUS
  Filled 2016-03-14: qty 250

## 2016-03-14 MED ORDER — VASOPRESSIN 20 UNIT/ML IV SOLN
0.0300 [IU]/min | INTRAVENOUS | Status: DC
  Administered 2016-03-14 – 2016-03-15 (×2): 0.03 [IU]/min via INTRAVENOUS
  Filled 2016-03-14 (×2): qty 2

## 2016-03-14 MED ORDER — EPINEPHRINE PF 1 MG/10ML IJ SOSY
PREFILLED_SYRINGE | INTRAMUSCULAR | Status: AC
  Filled 2016-03-14: qty 10

## 2016-03-14 MED ORDER — ALBUMIN HUMAN 5 % IV SOLN
INTRAVENOUS | Status: AC
  Administered 2016-03-14: 12.5 g
  Filled 2016-03-14: qty 500

## 2016-03-14 MED ORDER — NOREPINEPHRINE BITARTRATE 1 MG/ML IV SOLN
2.0000 ug/min | INTRAVENOUS | Status: DC
  Administered 2016-03-14: 40 ug/min via INTRAVENOUS
  Administered 2016-03-14: 60 ug/min via INTRAVENOUS
  Administered 2016-03-14: 70 ug/min via INTRAVENOUS
  Administered 2016-03-14: 30 ug/min via INTRAVENOUS
  Administered 2016-03-15: 20 ug/min via INTRAVENOUS
  Administered 2016-03-15: 25 ug/min via INTRAVENOUS
  Filled 2016-03-14 (×9): qty 16

## 2016-03-14 MED ORDER — ORAL CARE MOUTH RINSE
15.0000 mL | Freq: Four times a day (QID) | OROMUCOSAL | Status: DC
  Administered 2016-03-14: 15 mL via OROMUCOSAL

## 2016-03-14 MED ORDER — ORAL CARE MOUTH RINSE
15.0000 mL | Freq: Four times a day (QID) | OROMUCOSAL | Status: DC
  Administered 2016-03-14 – 2016-03-15 (×3): 15 mL via OROMUCOSAL

## 2016-03-14 MED ORDER — HYDROCORTISONE NA SUCCINATE PF 100 MG IJ SOLR
50.0000 mg | Freq: Four times a day (QID) | INTRAMUSCULAR | Status: DC
  Administered 2016-03-14 – 2016-03-16 (×8): 50 mg via INTRAVENOUS
  Filled 2016-03-14 (×8): qty 2

## 2016-03-14 MED ORDER — STERILE WATER FOR INJECTION IV SOLN
INTRAVENOUS | Status: DC
  Administered 2016-03-14 – 2016-03-16 (×6): via INTRAVENOUS
  Filled 2016-03-14 (×11): qty 850

## 2016-03-14 MED ORDER — DOPAMINE-DEXTROSE 3.2-5 MG/ML-% IV SOLN
2.0000 ug/kg/min | INTRAVENOUS | Status: DC
  Administered 2016-03-14: 3 ug/kg/min via INTRAVENOUS
  Filled 2016-03-14: qty 250

## 2016-03-14 NOTE — Progress Notes (Signed)
eLink Physician-Brief Progress Note Patient Name: Robyn Porter DOB: 1950-06-25 MRN: [id number]   Date of Service  03/14/2016  HPI/Events of Note  ABG on PRVC 15 with patient breathing at 20 = 7.13/29.7/145.0/11. A NaHCO3 IV infusion already started.   eICU Interventions  Will order: 1. Increase RR to 30. 2. ABG at 6 PM.      Intervention Category Major Interventions: Acid-Base disturbance - evaluation and management;Respiratory failure - evaluation and management  Lysle Dingwall 03/14/2016, 3:41 PM

## 2016-03-14 NOTE — Progress Notes (Signed)
OT Cancellation Note  Patient Details Name: Robyn Porter MRN: [id number] DOB: [birth year]   Cancelled Treatment:    Reason Eval/Treat Not Completed: Medical issues which prohibited therapy. Pt with respiratory arrest early this AM and intubated. Spoke to RN and we need to hold today.   [name] [id number] 03/14/2016, 7:47 AM

## 2016-03-14 NOTE — Progress Notes (Addendum)
ANTICOAGULATION CONSULT NOTE - Initial Consult  Pharmacy Consult for heparin Indication: Afib and concern for new clot s/p arrest  Allergies  Allergen Reactions  . Adhesive [Tape] Other (See Comments)    Tears skin.  Please use "paper" tape  . Amoxicillin Hives  . Contrast Media [Iodinated Diagnostic Agents] Hives  . Shellfish-Derived Products Hives    Can eat crab cakes  . Benadryl [Diphenhydramine Hcl] Rash    Patient Measurements: Height: 5\' 4"  (162.6 cm) Weight: 277 lb 1.6 oz (125.7 kg) IBW/kg (Calculated) : 54.7 Heparin Dosing Weight: 85kg  Vital Signs: Temp: 97.5 F (36.4 C) (11/16 1656) Temp Source: Oral (11/16 1656) BP: 97/41 (11/16 2300) Pulse Rate: 59 (11/17 0000)  Labs:  Recent Labs  03/11/16 0447  03/12/16 0414  03/13/16 0410 03/13/16 1623 03/13/16 2157  HGB 9.2*  --  9.2*  --  9.7*  --  8.6*  HCT 30.2*  --  31.1*  --  31.9*  --  28.7*  PLT 103*  --  106*  --  95*  --  118*  APTT >200*  --  189*  --  >200*  --   --   CREATININE  --   < > 1.98*  < > 2.11* 2.18* 1.97*  TROPONINI  --   --   --   --   --   --  0.03*  < > = values in this interval not displayed.  Estimated Creatinine Clearance: 36.9 mL/min (by C-G formula based on SCr of 1.97 mg/dL (H)).   Medical History: Past Medical History:  Diagnosis Date  . Afib ([place])   . Aortic stenosis   . CHF (congestive heart failure) ([place])   . Chronic kidney disease    Stage 3  . COPD (chronic obstructive pulmonary disease) ([place])   . Diabetes mellitus without complication ([place])   . Hypertension   . Pacemaker     Medications:  Prescriptions Prior to Admission  Medication Sig Dispense Refill Last Dose  . acetaminophen (TYLENOL) 325 MG tablet Take 1-2 tablets (325-650 mg total) by mouth every 6 (six) hours as needed for mild pain.   03/23/2016 at Unknown time  . albuterol (PROVENTIL HFA;VENTOLIN HFA) 108 (90 Base) MCG/ACT inhaler Inhale 1 puff into the lungs every 6 (six) hours as needed for wheezing or  shortness of breath.   Past Week at Unknown time  . albuterol (PROVENTIL) (2.5 MG/3ML) 0.083% nebulizer solution Take 2.5 mg by nebulization every 8 (eight) hours as needed for wheezing.   0 Past Month at Unknown time  . allopurinol (ZYLOPRIM) 100 MG tablet Take 50 mg by mouth daily.    03/23/2016 at Unknown time  . atorvastatin (LIPITOR) 20 MG tablet Take 20 mg by mouth daily.   03/15/2016 at Unknown time  . budesonide-formoterol (SYMBICORT) 80-4.5 MCG/ACT inhaler Inhale 2 puffs into the lungs 2 (two) times daily.   03/08/2016 at Unknown time  . Cholecalciferol (VITAMIN D3) 5000 units CAPS Take 1 capsule by mouth daily.   02/27/2016 at Unknown time  . cyanocobalamin 500 MCG tablet Take 500 mcg by mouth daily.   02/27/2016 at Unknown time  . Exenatide ER (BYDUREON) 2 MG PEN Inject 2 mg into the skin every Saturday. Take every Saturday. Patient states she taken today (03/08/2016) because she forgot to take it this past Saturday   03/24/2016 at Unknown time  . ferrous sulfate 325 (65 FE) MG tablet Take 1 tablet (325 mg total) by mouth 3 (three) times daily  with meals. 90 tablet 3 03/23/2016 at Unknown time  . ipratropium-albuterol (DUONEB) 0.5-2.5 (3) MG/3ML SOLN Inhale 3 mLs into the lungs every 6 (six) hours as needed (for shortness of breath).   5 02/27/2016 at Unknown time  . loratadine (CLARITIN) 10 MG tablet Take 10 mg by mouth daily.   03/07/2016 at Unknown time  . metolazone (ZAROXOLYN) 2.5 MG tablet Take 1 tablet (2.5 mg total) by mouth as needed (3 lb weight gain overnight or 5lb week gain in a week). As directed by HF clinic. 10 tablet 3 Past Week at Unknown time  . metoprolol succinate (TOPROL-XL) 25 MG 24 hr tablet Take 1 tablet (25 mg total) by mouth daily. 60 tablet 3 02/29/2016 at 0900  . Multiple Vitamin (MULTIVITAMIN WITH MINERALS) TABS tablet Take 1 tablet by mouth daily. Reported on 07/09/2015   02/27/2016 at Unknown time  . potassium chloride SA (K-DUR,KLOR-CON) 20 MEQ tablet Take 2 tablets (40 mEq  total) by mouth 2 (two) times daily. Take an extra 40 meq when you take Metolazone 130 tablet 6 03/23/2016 at Unknown time  . torsemide (DEMADEX) 20 MG tablet Take 4 tablets (80 mg total) by mouth 2 (two) times daily. 240 tablet 6 03/07/2016 at Unknown time  . vitamin C (ASCORBIC ACID) 500 MG tablet Take 1 tablet (500 mg total) by mouth 2 (two) times daily. 60 tablet 0 02/27/2016 at Unknown time  . XARELTO 15 MG TABS tablet TAKE 1 TABLET BY MOUTH EVERY DAY WITH SUPPER 30 tablet 6 02/27/2016 at 2200   Scheduled:  . arformoterol  15 mcg Nebulization BID  . atorvastatin  20 mg Per Tube q1800  . budesonide (PULMICORT) nebulizer solution  0.5 mg Nebulization BID  . ceFEPime (MAXIPIME) IV  2 g Intravenous Q12H  . cholecalciferol  5,000 Units Oral Daily  . darbepoetin (ARANESP) injection - NON-DIALYSIS  100 mcg Subcutaneous Q Wed-1800  . famotidine (PEPCID) IV  20 mg Intravenous Q24H  . ipratropium-albuterol  3 mL Inhalation Q6H  . mouth rinse  15 mL Mouth Rinse BID  . midodrine  5 mg Per Tube TID WC  . multivitamin with minerals  1 tablet Oral Daily  . sodium chloride flush  10-40 mL Intracatheter Q12H  . [START ON 03/15/2016] vancomycin  1,250 mg Intravenous Q24H  . vancomycin  2,500 mg Intravenous Once  . cyanocobalamin  500 mcg Oral Daily  . vitamin C  500 mg Oral BID   Infusions:  . fentaNYL infusion INTRAVENOUS 125 mcg/hr (03/14/16 0100)  . heparin 10,000 units/ 20 mL infusion syringe 900 Units/hr (March 17, 2016 2000)  . norepinephrine (LEVOPHED) Adult infusion 30 mcg/min (03/14/16 0100)  . dialysis replacement fluid (prismasate) 300 mL/hr at 03-17-16 1606  . dialysis replacement fluid (prismasate) 200 mL/hr at 03/17/2016 1818  . dialysate (PRISMASATE) 1,000 mL/hr at Mar 17, 2016 1205    Assessment: 65yo female was on Xarelto PTA for Afib, d/c'd after 2023/03/08 dose, was put on CRRT, coded 03-18-2023 pm, now CRRT stopped s/p code, PTT had been ~200 or greater each morning from heparin in CRRT machine, now to  start systemic heparin for Afib and possible new clot.  Goal of Therapy:  Heparin level 0.3-0.7 units/ml aPTT 66-102 seconds Monitor platelets by anticoagulation protocol: Yes   Plan:  Spoke w/ RN who will draw am labs now to ensure heparin has cleared since stopped ~2100 and start heparin when appropriate.  Wynona Neat, PharmD, BCPS  03/14/2016,1:35 AM    ADDENDUM: Heparin level <0.1, aPTT 56,  ok to start heparin.  Will start heparin gtt at 1200 units/hr and monitor heparin levels and CBC. VB  4:02 AM

## 2016-03-14 NOTE — Progress Notes (Signed)
Patient transported to CT and returned to [id number] without complications. RT will continue to monitor.

## 2016-03-14 NOTE — Progress Notes (Addendum)
Pharmacy Antibiotic Note  Robyn Porter is a 65 y.o. female admitted on 03/07/2016 with sepsis.  Pharmacy has been consulted for Vancomycin and Cefepime dosing.  Pt was on CRRT, which was stopped last PM at 2100.  Vanc and Cefepime were started at 0100 today.  Now CRRT has been resumed and dosing will be adjusted.  Plan: Change Vancomycin to 1250mg  IV q24, next dose at 1800 tomorrow Change Cefepime to 2g IV q12, next dose at 0600 F/U cultures Vancomycin level as indicated Adjust dosing of medications for CRRT as needed   Height: 5\' 4"  (162.6 cm) Weight: 260 lb (117.9 kg) IBW/kg (Calculated) : 54.7  Temp (24hrs), Avg:98.3 F (36.8 C), Min:98.1 F (36.7 C), Max:98.6 F (37 C)   Recent Labs Lab 03/12/16 0414  03/13/16 0410  03/13/16 2133 03/13/16 2157 03/13/16 2335 03/14/16 0225 03/14/16 0237 03/14/16 0700 03/14/16 0900 03/14/16 1227 03/14/16 1343 03/14/16 1345 03/14/16 1600  WBC 7.5  --  9.0  --   --  12.7*  --   --  18.8*  --   --   --   --  28.7*  --   CREATININE 1.98*  < > 2.11*  < >  --  1.97*  --  2.75*  --  3.15*  --   --   --  3.50* 3.38*  LATICACIDVEN  --   --   --   --  5.8*  --  2.4*  --   --   --  5.3* 7.8* 10.11*  --   --   < > = values in this interval not displayed.  Estimated Creatinine Clearance: 20.7 mL/min (by C-G formula based on SCr of 3.38 mg/dL (H)).    Allergies  Allergen Reactions  . Adhesive [Tape] Other (See Comments)    Tears skin.  Please use "paper" tape  . Amoxicillin Hives  . Contrast Media [Iodinated Diagnostic Agents] Hives  . Shellfish-Derived Products Hives    Can eat crab cakes  . Benadryl [Diphenhydramine Hcl] Rash     Thank you for allowing pharmacy to be a part of this patient's care.  Gracy Bruins, PharmD Clinical Pharmacist [facility]

## 2016-03-14 NOTE — Procedures (Signed)
Central Venous Catheter Insertion Procedure Note Robyn Porter [id number] [birth year]  Procedure: Insertion of Central Venous Catheter Indications: Drug and/or fluid administration and Frequent blood sampling  Procedure Details Consent: Risks of procedure as well as the alternatives and risks of each were explained to the (patient/caregiver).  Consent for procedure obtained. Time Out: Verified patient identification, verified procedure, site/side was marked, verified correct patient position, special equipment/implants available, medications/allergies/relevent history reviewed, required imaging and test results available.  Performed Real time [place] was used to ID and cannulate the vessel  Maximum sterile technique was used including antiseptics, cap, gloves, gown, hand hygiene, mask and sheet. Skin prep: Chlorhexidine; local anesthetic administered A antimicrobial bonded/coated triple lumen catheter was placed in the left femoral vein due to multiple attempts, no other available access using the Seldinger technique.  Evaluation Blood flow good Complications: No apparent complications Patient did tolerate procedure well. Chest X-ray ordered to verify placement.  CXR: n/a .  Clementeen Graham 03/14/2016, 2:52 PM  Erick Colace ACNP-BC [place] Pager # [phone] OR # [phone] if no answer

## 2016-03-14 NOTE — Progress Notes (Addendum)
Patient ID: Robyn Porter, female   DOB: 07-Sep-1950, 65 y.o.   MRN: [id number]    Advanced Heart Failure Rounding Note   Subjective:    Admitted with marked volume overload. Sluggish diuresis despite addition of milrinone and switch to lasix gtt.   Renal consulted 03/04/16 with poor diuresis.  Pt placed on CRRT. Marked weight loss.   On 11/16, complained of cramping right thigh. Last night, became unresponsive.  CVP was 3, required CPR and was intubated.  Norepinephrine begun, now at 43.  This morning, she is awake/alert on vent.  SBP still in 70s.  Got NS boluses during the night.  Off CVVH.  CVP 7 this morning.  CT head concerning for possible left occipital infarct but neurology thinks this was more likely a hypotensive event from hypovolemia (versus infection/sepsis).  Empiric abx started by CCM.   Echo: EF 60-65%, severe LVH, moderate AS with mean gradient 32 mmHg, RV not well visualized.   Objective:   Weight Range:  Vital Signs:   Temp:  [97.4 F (36.3 C)-98.2 F (36.8 C)] 98.1 F (36.7 C) (11/17 0300) Pulse Rate:  [59-68] 64 (11/17 0741) Resp:  [10-23] 21 (11/17 0741) BP: (75-120)/(33-87) 75/34 (11/17 0741) SpO2:  [94 %-100 %] 100 % (11/17 0600) Arterial Line BP: (95-110)/(32-39) 95/34 (11/17 0600) FiO2 (%):  [40 %-70 %] 40 % (11/17 0741) Weight:  [260 lb (117.9 kg)] 260 lb (117.9 kg) (11/17 0253) Last BM Date: 03/13/16  Weight change: Filed Weights   03/12/16 0500 03/13/16 0442 03/14/16 0253  Weight: 289 lb 12.8 oz (131.5 kg) 277 lb 1.6 oz (125.7 kg) 260 lb (117.9 kg)    Intake/Output:   Intake/Output Summary (Last 24 hours) at 03/14/16 0750 Last data filed at 03/14/16 0600  Gross per 24 hour  Intake           1757.2 ml  Output             3448 ml  Net          -1690.8 ml     Physical Exam: CVP 7 General:  Intubated, awake.  HEENT: Normal Neck: Thick. JVP not elevated. Carotids 2+ bilat; no bruits. No thyromegaly or lymphadenopathy noted.    Cor: PMI  nondisplaced. Regular. 3/6 systolic crescendo-decrescendo murmur RUSB.   Lungs: Distant, diminished basilar sounds.  Abdomen: obese, soft, breakthrough bleeding  Extremities: no cyanosis, clubbing, rash, Chronic 1+ edema into thighs. RIJ Temp dialysis cath.  Neuro: alert on vent.  Moves all 4 extremities w/o difficulty.  Telemetry: Reviewed, underlying afib with V-pacing   Labs: Basic Metabolic Panel:  Recent Labs Lab 03/10/16 0445  03/11/16 0449  03/12/16 0414 03/12/16 1615 03/13/16 0410 03/13/16 1623 03/13/16 2157 03/14/16 0225  NA 133*  < > 133*  < > 132* 130* 133* 132* 134* 128*  K 3.8  < > 4.4  < > 4.3 4.3 4.6 4.9 4.3 5.3*  CL 101  < > 101  < > 100* 98* 100* 100* 107 100*  CO2 23  < > 26  < > 24 24 27 25  18* 16*  GLUCOSE 97  < > 95  < > 83 101* 96 121* 126* 164*  BUN 17  < > 17  < > 17 14 17 17 15 20   CREATININE 1.85*  < > 2.01*  < > 1.98* 2.14* 2.11* 2.18* 1.97* 2.75*  CALCIUM 8.6*  < > 8.8*  < > 8.9 9.0 9.3 9.5 7.8* 9.1  MG 2.4  --  2.4  --  2.4  --  2.4  --  2.3  --   PHOS 2.3*  < > 2.9  < > 3.4 3.3 4.1 4.7*  --  7.0*  < > = values in this interval not displayed.  Liver Function Tests:  Recent Labs Lab 03/12/16 0414 03/12/16 1615 03/13/16 0410 03/13/16 1623 03/14/16 0225  ALBUMIN 2.6* 2.6* 2.7* 2.7* 2.6*   No results for input(s): LIPASE, AMYLASE in the last 168 hours. No results for input(s): AMMONIA in the last 168 hours.  CBC:  Recent Labs Lab 03/10/16 0445 03/11/16 0447 03/12/16 0414 03/13/16 0410 03/13/16 2157 03/14/16 0237  WBC 7.4 8.2 7.5 9.0 12.7* 18.8*  NEUTROABS 4.8 5.2 4.7 5.5  --  15.2*  HGB 8.8* 9.2* 9.2* 9.7* 8.6* 8.5*  HCT 28.0* 30.2* 31.1* 31.9* 28.7* 27.9*  MCV 102.2* 103.1* 102.3* 101.3* 102.9* 101.8*  PLT 115* 103* 106* 95* 118* 120*    Cardiac Enzymes:  Recent Labs Lab 03/13/16 2157  TROPONINI 0.03*    BNP: BNP (last 3 results)  Recent Labs  07/09/15 1158 07/19/15 1124 03/07/2016 1754  BNP 133.0* 149.0* 429.4*      ProBNP (last 3 results) No results for input(s): PROBNP in the last 8760 hours.    Other results:  Imaging: Portable Chest Xray  Result Date: 03/14/2016 CLINICAL DATA:  Acute hypoxic respiratory failure. EXAM: PORTABLE CHEST 1 VIEW COMPARISON:  03/13/2016 FINDINGS: Endotracheal tube and right jugular central venous catheter remain in appropriate position. Pacemaker remains in place. Low lung volumes are seen. Increased opacity in both lung bases may be due to increased atelectasis or infiltrate. No evidence of pneumothorax or pleural effusion. IMPRESSION: Decreased lung volumes with increased bibasilar atelectasis versus infiltrates. Electronically Signed   By: Earle Gell M.D.   On: 03/14/2016 07:42   Portable Chest Xray  Result Date: 03/13/2016 CLINICAL DATA:  Recent respiratory arrest EXAM: PORTABLE CHEST 1 VIEW COMPARISON:  03/04/2016 FINDINGS: Endotracheal tube is 2.2 cm above the carina. The right jugular sheath extends into the low SVC. Transvenous cardiac leads appear intact. The lungs are clear. No large effusion. No pneumothorax. Normal pulmonary vasculature. Unchanged cardiomegaly. IMPRESSION: 1.  Support equipment appears satisfactorily positioned. 2. Unchanged cardiomegaly.  No consolidation or effusion. Electronically Signed   By: Andreas Newport M.D.   On: 03/13/2016 22:22   Ct Head Code Stroke Wo Contrast  Result Date: 03/13/2016 CLINICAL DATA:  Code stroke. Initial evaluation for blurry vision, double vision, respiratory arrest. EXAM: CT HEAD WITHOUT CONTRAST TECHNIQUE: Contiguous axial images were obtained from the base of the skull through the vertex without intravenous contrast. COMPARISON:  None available. FINDINGS: Brain: Cerebral volume normal. Mild chronic microvascular ischemic disease within the periventricular white matter. No acute intracranial hemorrhage. There is question of subtle loss of gray-white matter differentiation within the left occipital pole  (series 2, image 12). Unclear whether this is artifactual nature versus possible early ischemic changes. Otherwise, gray-white matter differentiation maintained. Vascular: No hyperdense vessel. Skull: Scalp soft tissues within normal limits.  Calvarium intact. Sinuses/Orbits: Globes and orbits within normal limits. Small fluid level noted within the right maxilla sinus. Paranasal sinuses are otherwise clear. No mastoid effusion. ASPECTS [facility] Stroke Program Early CT Score) - Ganglionic level infarction (caudate, lentiform nuclei, internal capsule, insula, M1-M3 cortex): 7 - Supraganglionic infarction (M4-M6 cortex): 3 Total score (0-10 with 10 being normal): 10 IMPRESSION: 1. Question subtle blurring of the gray-white matter differentiation within the left occipital pole. While this finding  may be artifactual in nature as there is some motion artifact on this exam, possible early/evolving ischemia not entirely excluded. This could be further evaluated with dedicated MRI as clinically indicated. 2. No other acute intracranial process. 3. ASPECTS is 10 Critical Value/emergent results were called by telephone at the time of interpretation on 03/13/2016 at 11:35 pm to Dr. Cheral Marker , who verbally acknowledged these results. Electronically Signed   By: Jeannine Boga M.D.   On: 03/13/2016 23:41     Medications:     Scheduled Medications: . arformoterol  15 mcg Nebulization BID  . atorvastatin  20 mg Per Tube q1800  . budesonide (PULMICORT) nebulizer solution  0.5 mg Nebulization BID  . [START ON 03/15/2016] ceFEPime (MAXIPIME) IV  2 g Intravenous Q24H  . chlorhexidine gluconate (MEDLINE KIT)  15 mL Mouth Rinse BID  . cholecalciferol  5,000 Units Oral Daily  . darbepoetin (ARANESP) injection - NON-DIALYSIS  100 mcg Subcutaneous Q Wed-1800  . famotidine (PEPCID) IV  20 mg Intravenous Q24H  . ipratropium-albuterol  3 mL Inhalation Q6H  . mouth rinse  15 mL Mouth Rinse BID  . mouth rinse  15 mL Mouth  Rinse QID  . midodrine  5 mg Per Tube TID WC  . multivitamin with minerals  1 tablet Oral Daily  . sodium chloride  250 mL Intravenous Once  . sodium chloride flush  10-40 mL Intracatheter Q12H  . cyanocobalamin  500 mcg Oral Daily  . vitamin C  500 mg Oral BID    Infusions: . DOPamine    . fentaNYL infusion INTRAVENOUS 125 mcg/hr (03/14/16 0100)  . heparin 10,000 units/ 20 mL infusion syringe 900 Units/hr (03/13/16 2000)  . heparin 1,200 Units/hr (03/14/16 0448)  . norepinephrine (LEVOPHED) Adult infusion 40 mcg/min (03/14/16 0700)  . dialysis replacement fluid (prismasate) 300 mL/hr at 03/13/16 1606  . dialysis replacement fluid (prismasate) 200 mL/hr at 03/13/16 1818  . dialysate (PRISMASATE) 1,000 mL/hr at 03/13/16 1205    PRN Medications: Place/Maintain arterial line **AND** sodium chloride, acetaminophen, fentaNYL, heparin, heparin, heparin, midazolam, ondansetron (ZOFRAN) IV, sodium chloride flush   Assessment/Plan/Discussion    Robyn Porter is a 65 y.o. female with history with chronic systolic CHF, Anemia, OHS/OSA, symptomatic bradycardia s/p MDT PPm with CRT-P upgrade given high percentage of RV pacing, CKD stage IV.  Admitted with marked volume overload.  1. Chronic diastolic CHF: EF 14-43% on echo this admission, RV poorly visualized. Upgrade to [employer] CRT in past given constant RV pacing. Volume status markedly elevated initally. Weight up 30 lbs since August.  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  Renal function stable.  Milrinone begun for RV support.   Had to start CVVH for volume removal given diuretic failure.  Had been able to titrate off milrinone and was on just midodrine.  > 100 lb weight loss with CVVH.  Last night, had syncope/marked hypotension likely due to hypovolemia (versus septic given WBC rise but no fever).  Now off CVVH, on norepinephrine titrated up to 40.  CVP 7 this morning after several boluses.   Co-ox 61%.  - Bolus 250 cc NS, add dopamine to keep MAP at least 65.  - On abx empirically to cover infection.  - Continue norepinephrine.  - Continue midodrine to 5 mg three time a day.  - Will end up needing RRT long-term it looks like for volume management.  2. Symptomatic bradycardia: MDT PPM. Upgraded to CRT given high percentage RV pacing.  3. ARF on CKD Stage V: Now off CVVH with hypotension, will need to restart with K creeping up.  Likely will be on HD long-term. 4. Chronic atrial fibrillation: HR not elevated.  - She remains on heparin gtt.    5. OHS/OSA: Continue nightly CPAP.  6. 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 Given 1UPRBC on admit.  - States she has occasional hemorrhoidal bleed - Has been followed by Dr. Lindi Adie who is working up anemia - Hgb 8.4 -> 7.6 -> 8.2 -> 8.4 -> 8.2 -> 8.7 => 8.3=>8.8 =>9.2=>9.7 => 8.5. Continue to follow.  7. Chronic venous stasis: Chronic changes, but stable. Given pain in right thigh, will get venous dopplers.    8. Morbid obesity: PT following. Recommending HHPT vs SNF.   9. Neuro: Question of left occipital CVA on CT but does not have corresponding neuro findings.  Suspect event was related to hypovolemia.  Neurology following.    10. Aortic stenosis:  Moderate by echo 03/02/16.  Muffled S2 concerning that AS could be worse.  Would consider evaluation via TEE eventually => prior to discharge.  11. Acute respiratory failure: Intubated post-syncope/arrest. Awake on vent.  Empiric abx per CCM.  Hopefully extubated soon.  12. Right thigh pain: Assess for DVT (though she has been on heparin gtt).  13. ID: ?Septic shock contributing to hypotension.  WBCs up but no fever.  On empiric abx. Blood cultures sent.   40 minutes critical care time.   Length of Stay: [address],  03/14/2016, 7:50 AM  Advanced Heart Failure Team Pager [phone] (M-F; 7a - 4p)  Please contact [facility] for night-coverage after hours (4p -7a ) and weekends  on [email or web address]

## 2016-03-14 NOTE — Progress Notes (Signed)
STROKE TEAM PROGRESS NOTE   HISTORY OF PRESENT ILLNESS (per record) Robyn Porter is an 65 y.o. female who intially presented on 11/1 for management of marked volume overload in the setting of chronic systolic CHF. Her treatment this admission was confounded by sluggish diuresis despite addition of milrinone and switch to Lasix gtt. She was placed on CRRT (CVVH) after renal consult on 11/7. Total weight off as of 11/16 was 125 lbs. Today she had some right upper thigh pain, which began after she completed PT. Now on heparin gtt while getting CVVH. Her PMHx also includes stage 4 CKD, chronic atrial fibrillation (on Xarelto as outpatient), OSA, anemia, chronic venous stasis, symptomatic bradycardia and morbid obesity.   This evening the patient experienced respiratory failure and loss of consciousness. She had reported double and blurry vision prior to LOC, as well as 10/10 leg cramps. During the acute event, systolic blood pressure from cuff was in the low 60's with decreased MAP. At time of intial physician evaluation following the event (while she was intubated and unconscious), she was unresponsive to stimuli with constricted pupils that were not responsive to light. She eventually regained consciousness during her evaluation. After arterial line was placed, her initial systolic pressure reading was noted to be 104.   The most likely etiology for her LOC was felt to be syncope per primary team notes, with unclear underlying etiology - possible mechanisms documented include opiate effect. Brainstem stroke or transient brainstem hypoperfusion due to vertebrobasilar insufficiency was also on the DDx   She was sent for emergent CT which showed an equivocal hypodensity along the left occipital pole, also compatible with volume averaging artifact from a prominent sulcus. This was felt to be worrisome by the primary team given her initial symptom of blurred vision prior to the respiratory arrest.     SUBJECTIVE (INTERVAL HISTORY) The patient is intubated. Follows simple commands. No focal deficits noted on limited exam.   OBJECTIVE Temp:  [97.4 F (36.3 C)-98.3 F (36.8 C)] 98.3 F (36.8 C) (11/17 0749) Pulse Rate:  [59-68] 60 (11/17 0749) Cardiac Rhythm: Ventricular paced (11/17 0400) Resp:  [10-23] 20 (11/17 0749) BP: (75-120)/(33-67) 93/38 (11/17 0749) SpO2:  [94 %-100 %] 100 % (11/17 0749) Arterial Line BP: (95-110)/(32-39) 95/34 (11/17 0600) FiO2 (%):  [40 %-70 %] 40 % (11/17 0741) Weight:  [117.9 kg (260 lb)] 117.9 kg (260 lb) (11/17 0253)  CBC:  Recent Labs Lab 03/13/16 0410 03/13/16 2157 03/14/16 0237  WBC 9.0 12.7* 18.8*  NEUTROABS 5.5  --  15.2*  HGB 9.7* 8.6* 8.5*  HCT 31.9* 28.7* 27.9*  MCV 101.3* 102.9* 101.8*  PLT 95* 118* 120*    Basic Metabolic Panel:  Recent Labs Lab 03/13/16 0410 03/13/16 1623 03/13/16 2157 03/14/16 0225  NA 133* 132* 134* 128*  K 4.6 4.9 4.3 5.3*  CL 100* 100* 107 100*  CO2 27 25 18* 16*  GLUCOSE 96 121* 126* 164*  BUN 17 17 15 20   CREATININE 2.11* 2.18* 1.97* 2.75*  CALCIUM 9.3 9.5 7.8* 9.1  MG 2.4  --  2.3  --   PHOS 4.1 4.7*  --  7.0*    Lipid Panel: No results found for: CHOL, TRIG, HDL, CHOLHDL, VLDL, LDLCALC HgbA1c:  Lab Results  Component Value Date   HGBA1C 5.1 11/29/2015   Urine Drug Screen: No results found for: LABOPIA, COCAINSCRNUR, LABBENZ, AMPHETMU, THCU, LABBARB    IMAGING  Portable Chest Xray 03/14/2016 Decreased lung volumes with increased bibasilar atelectasis versus  infiltrates.   Portable Chest Xray 03/13/2016 1.  Support equipment appears satisfactorily positioned.  2. Unchanged cardiomegaly.  No consolidation or effusion.    Ct Head Code Stroke Wo Contrast 03/13/2016 1. Question subtle blurring of the gray-white matter differentiation within the left occipital pole. While this finding may be artifactual in nature as there is some motion artifact on this exam, possible  early/evolving ischemia not entirely excluded. This could be further evaluated with dedicated MRI as clinically indicated.  2. No other acute intracranial process.  3. ASPECTS is 10 Critical     PHYSICAL EXAM HEENT-  Normocephalic/atraumatic.  Lungs- Intubated Extremities- Prominent venous stasis changes distal lower extremities.    Neurological Examination Mental Status: Intubated. Drowsy to somnolent in context of sedation with Fentanyl. Awakens to light noxious stimuli and will follow commands such as squeezing examiner's fingers and deviating eyes to right and left. Attempts to mouth statements but these are unintelligible.  Cranial Nerves: II: Deviates eyes to left and right in response to peripheral visual stimuli. Fixates on visual stimuli appropriately. PERRL.  III,IV, VI: Ptosis not present. Able to gaze to left and right to command. No nystagmus.  V,VII: Grimaces to command symmetrically. Responds to tactile stimulation bilaterally.  VIII: hearing intact to commands IX,X: intubated XI: Head rotates to left and right with equal strength XII: intubated Motor: Grips examiners hand with 4/5 strength bilaterally. Flexes legs backwards to command bilaterally without elevating above bed, but symmetric with no delay in initiating movement and can forcefully withdraw. Morbid obesity confounds the lower extremity exam.  Sensory: Reacts to sensory stimuli in all 4 extremities.  Deep Tendon Reflexes: Hypoactive in context of morbid obesity limiting the ability to elicit Cerebellar: Unable to assess.  Gait: Unable to assess.      ASSESSMENT/PLAN Robyn Porter is a 65 y.o. female with history of stage 4 CKD, continuous renal replacement therapy, chronic systolic CHF, chronic atrial fibrillation (on Xarelto as outpatient), OSA, anemia, hypertension, diabetes mellitus, COPD, aortic stenosis, chronic venous stasis, symptomatic bradycardia, status post permanent pacemaker, and morbid  obesity presenting with respiratory failure, loss of consciousness, hypotension, visual changes, and right upper thigh pain. She did not receive IV t-PA due to Xarelto.  Possible Stroke:  Dominant infarct embolic secondary to atrial fib.  Resultant  No deficits but exam limited by sedation and intubation  MRI - PPM  MRA - PPM  Carotid Doppler - pending  LE Venous Duplex - pending  2D Echo - 03/02/2016 - EF 60 -65% - No cardiac source of emboli was indentified.   LDL - will order  HgbA1c - will order  VTE prophylaxis - IV heparin per pharmacy  Diet NPO time specified  Xarelto (rivaroxaban) daily prior to admission, now on heparin IV  Patient counseled to be compliant with her antithrombotic medications  Ongoing aggressive stroke risk factor management  Therapy recommendations:  pending  Disposition:  Pending  Hypertension  Systolic blood pressure running low - dopamine; levophed; midodrine  Permissive hypertension (OK if < 220/120) but gradually normalize in 5-7 days  Long-term BP goal normotensive  Hyperlipidemia  Home meds:  Lipitor 20 mg daily resumed in hospital  LDL pending, goal < 70  Continue statin at discharge  Diabetes  HgbA1c pending, goal < 7.0  Controlled  Other Stroke Risk Factors  Advanced age  Obesity, Body mass index is 44.63 kg/m., recommend weight loss, diet and exercise as appropriate   Obstructive sleep apnea  Chronic atrial fibrillation  Other  Active Problems  [employer] BIV PPM 09/19/2015 - Will [name] MD  Syncope; R thigh pain; D dimer 1.89 (H) - R/O DVT / PE - lower extremity venous Dopplers pending.  Leukocytosis - 18.8 (afebrile) currently on Maxipime and vancomycin (hold vancomycin per pharmacy)  Anemia - 8.5 / 27.9  BUN 20 ; creatinine 2.75 -> CRRT  Hyponatremia - 128  Hyperkalemia - 5.3  Thrombocytopenia - 95 -> 118 -> 120  PLAN  Repeat head [facility] day # [place]  PA-C [facility] Pager [phone] 03/14/2016, 1:18 PM I have personally examined this patient, reviewed notes, independently viewed imaging studies, participated in medical decision making and plan of care.ROS completed by me personally and pertinent positives fully documented  I have made any additions or clarifications directly to the above note. Agree with note above.  Patient had developed some transient blurred and double vision prior to loss of consciousness and it is unclear whether this represents a TIA or a stroke but she is currently intubated and sedated and this exam is limited. Brain CT scan shows no definite stroke is questionable hypodensity in the left occipital pole. MRI cannot be obtained because patient has pacemaker and cannot do CT angiogram due to renal failure. Recommend repeat CT scan of the head tomorrow morning. Long discussion with family at the bedside and answered questions. Greater than 50% time during this 35 minute visit was spent on counseling and coordination of care about stroke evaluation and treatment Antony Contras, [place] Pager: [phone] 03/14/2016 4:06 PM   To contact Stroke Continuity provider, please refer to [email or web address] After hours, contact General Neurology

## 2016-03-14 NOTE — Procedures (Signed)
Patient seen and examined on CRRT.  Qb 150, UF goal: none.  Upon my arrival, pt appeared to be in extremis. Hgb 6.9, MAP low high 30s/ low 40s on 70 levophed.  Pt has antibodies which is delaying availability of blood transfusion. 1 amp bicarb with modest transient improvement in systolic BP Continue bicarb gtt, CVP 3ish, giving colloid with albumin x 1 to bridge until availability of blood.    She may need another pressor if OK with CCM.  Apparently both dobutamine and dopamine caused arrhythmias.  Would favor vaso- defer to their expert judgement.  I have spoken to Joaquim Lai, her niece.  Have explained that situation is dire and prognosis is overall grim.    Repeat gas is pending.  [name] [facility] [phone] pgr [phone] 9:32 PM

## 2016-03-14 NOTE — Progress Notes (Signed)
Pharmacy Antibiotic Note  Robyn Porter is a 65 y.o. female admitted on 03/05/2016 with CHF exacerbation and significant volume overload now with concern for sepsis. Pharmacy has been consulted for vancomycin and cefepime dosing. Pt was on CRRT from 11/7 to 11/16, but will likely need to have it restarted soon per nephro once etiology of shock is better understood. Blood cultures pending, WBC up to 18, afebrile.  Currently, CrCl ~22 ml/min but SCr is likely falsely low since CRRT was recently stopped, and pt is still not producing any urine. Pt is now s/p vancomycin 2.5g load on 11/17 (~0100). Based on renal function and weight, vancomycin dose would be 1500 mg q48h. However, given unclear renal function, will order a vancomycin random for when the next dose would be scheduled (11/19 at 0100) and assess need for re-dosing. Will reduce cefepime dosing based on current estimated CrCl. May need to adjust doses beforehand if CRRT is resumed earlier or if renal function changes.  Plan: -Hold vancomycin for now and assess level at 0100 on 11/19 -Reduce cefepime to 500mg  q24h -Follow-up need for CRRT, renal function, cultures  Height: 5\' 4"  (162.6 cm) Weight: 260 lb (117.9 kg) IBW/kg (Calculated) : 54.7  Temp (24hrs), Avg:98 F (36.7 C), Min:97.5 F (36.4 C), Max:98.3 F (36.8 C)   Recent Labs Lab 03/11/16 0447  03/12/16 0414  03/13/16 0410 03/13/16 1623 03/13/16 2133 03/13/16 2157 03/13/16 2335 03/14/16 0225 03/14/16 0237 03/14/16 0700 03/14/16 0900  WBC 8.2  --  7.5  --  9.0  --   --  12.7*  --   --  18.8*  --   --   CREATININE  --   < > 1.98*  < > 2.11* 2.18*  --  1.97*  --  2.75*  --  3.15*  --   LATICACIDVEN  --   --   --   --   --   --  5.8*  --  2.4*  --   --   --  5.3*  < > = values in this interval not displayed.  Estimated Creatinine Clearance: 22.2 mL/min (by C-G formula based on SCr of 3.15 mg/dL (H)).    Allergies  Allergen Reactions  . Adhesive [Tape] Other (See  Comments)    Tears skin.  Please use "paper" tape  . Amoxicillin Hives  . Contrast Media [Iodinated Diagnostic Agents] Hives  . Shellfish-Derived Products Hives    Can eat crab cakes  . Benadryl [Diphenhydramine Hcl] Rash    Antimicrobials this admission:  11/16 Vancomycin >> 11/16 Cefepime >>  Dose adjustments this admission:  11/17: CRRT off, renal function ~22 ml/min -Hold vancomycin and assess a random level when next scheduled dose would be due -Reduce cefepime to 500mg  IV q24h  Microbiology results:  11/17 BCx: IP 11/2 MRSA PCR: neg  Arrie Senate, PharmD PGY-1 Pharmacy Resident Pager: [phone] 03/14/2016

## 2016-03-14 NOTE — Progress Notes (Addendum)
Pt complaining of leg cramps 10/10, PRN medications not effective. CVP 3, and complaining of double vision. Web MD notified and verbal order given to stop CRRT for the night and will evaluate restarting in the morning.

## 2016-03-14 NOTE — Progress Notes (Signed)
[facility] Rounding Note  Subjective/Interval History:   CRRT started about 5PM 11/7  Had acute hypotensive episode yesterday afternoon resulting in AMS ; intubation.  On pressors now, lactate was 5-->2, WBC ct 18.  Empiric vanc/ cefepime.   CVPs yesterday AM ~ 9 --> 5 now, got some fluids.    Was reporting leg pain yesterday--> LE dopplers ordered.   Objective Vital signs in last 24 hours: Vitals:   03/14/16 0749 03/14/16 0800 03/14/16 0900 03/14/16 1000  BP: (!) 93/38 (!) 96/34 (!) 79/22   Pulse: 60 63 67 64  Resp: 20 (!) 21 (!) 21 (!) 21  Temp: 98.3 F (36.8 C)     TempSrc: Axillary     SpO2: 100% 100% 100% 100%  Weight:      Height:       Weight change: -7.757 kg (-17 lb 1.6 oz)  Intake/Output Summary (Last 24 hours) at 03/14/16 1002 Last data filed at 03/14/16 0600  Gross per 24 hour  Intake           1517.2 ml  Output             2624 ml  Net          -1106.8 ml    HEMODYNAMICS: CVP:  [13 mmHg-17 mmHg] 13 mmHg  Physical Exam: Blood pressure (!) 110/46, pulse 60, temperature 97.8 F (36.6 C), temperature source Oral, resp. rate 16, height 5' 4"  (1.626 m), weight 289 lb 12.8 oz (131.5 kg), SpO2 92 %. GEN: intubated, responding to commands HEENT: EOMI, PERRL NECK: R dialysis catheter in place PULM: clear CV tachycardic ABD: obese, ? RLQ pain but doesn't localize all that well so hard to tell EXT: + venous stasis, anasarca better   Recent Labs Lab 03/11/16 0449 03/11/16 1545 03/12/16 0414 03/12/16 1615 03/13/16 0410 03/13/16 1623 03/13/16 2157 03/14/16 0225 03/14/16 0700  NA 133* 130* 132* 130* 133* 132* 134* 128* 130*  K 4.4 4.3 4.3 4.3 4.6 4.9 4.3 5.3* 5.7*  CL 101 98* 100* 98* 100* 100* 107 100* 101  CO2 26 22 24 24 27 25  18* 16* 16*  GLUCOSE 95 103* 83 101* 96 121* 126* 164* 122*  BUN 17 19 17 14 17 17 15 20  22*  CREATININE 2.01* 2.17* 1.98* 2.14* 2.11* 2.18* 1.97* 2.75* 3.15*  CALCIUM 8.8* 9.0 8.9 9.0 9.3 9.5 7.8* 9.1 9.3  PHOS 2.9 3.2 3.4 3.3 4.1  4.7*  --  7.0*  --      Recent Labs Lab 03/13/16 0410 03/13/16 1623 03/14/16 0225  ALBUMIN 2.7* 2.7* 2.6*     Recent Labs Lab 03/11/16 0447 03/12/16 0414 03/13/16 0410 03/13/16 2157 03/14/16 0237  WBC 8.2 7.5 9.0 12.7* 18.8*  NEUTROABS 5.2 4.7 5.5  --  15.2*  HGB 9.2* 9.2* 9.7* 8.6* 8.5*  HCT 30.2* 31.1* 31.9* 28.7* 27.9*  MCV 103.1* 102.3* 101.3* 102.9* 101.8*  PLT 103* 106* 95* 118* 120*   Results for Robyn, Porter (MRN [id number]) as of 03/06/2016 07:28  Ref. Range 03/04/2016 14:10  PTH Latest Ref Range: 15 - 65 pg/mL 50    Medications: . DOPamine Stopped (03/14/16 0945)  . fentaNYL infusion INTRAVENOUS 125 mcg/hr (03/14/16 0100)  . heparin 10,000 units/ 20 mL infusion syringe 900 Units/hr (03/13/16 2000)  . heparin 1,200 Units/hr (03/14/16 0448)  . norepinephrine (LEVOPHED) Adult infusion 50 mcg/min (03/14/16 0843)  . dialysis replacement fluid (prismasate) 300 mL/hr at 03/13/16 1606  . dialysis replacement fluid (prismasate) 200 mL/hr at 03/13/16  1818  . dialysate (PRISMASATE) 1,000 mL/hr at 03/13/16 1205   . arformoterol  15 mcg Nebulization BID  . atorvastatin  20 mg Per Tube q1800  . budesonide (PULMICORT) nebulizer solution  0.5 mg Nebulization BID  . [START ON 03/15/2016] ceFEPime (MAXIPIME) IV  2 g Intravenous Q24H  . chlorhexidine gluconate (MEDLINE KIT)  15 mL Mouth Rinse BID  . cholecalciferol  5,000 Units Oral Daily  . darbepoetin (ARANESP) injection - NON-DIALYSIS  100 mcg Subcutaneous Q Wed-1800  . famotidine (PEPCID) IV  20 mg Intravenous Q24H  . ipratropium-albuterol  3 mL Inhalation Q6H  . mouth rinse  15 mL Mouth Rinse BID  . mouth rinse  15 mL Mouth Rinse QID  . midodrine  5 mg Per Tube TID WC  . multivitamin with minerals  1 tablet Oral Daily  . sodium chloride flush  10-40 mL Intracatheter Q12H  . cyanocobalamin  500 mcg Oral Daily  . vitamin C  500 mg Oral BID     Background: 65 y.o. 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  Hollie Salk; Baseline worsening over course of 2017),  super morbid obesity, OHS/OSA (CPAP), dCHF, AS, pacemaker, AF. Admitted at least 3X 2017 with massive volume overload that responded to diuretics on those occasions (05/2015, 08/2015, 11/2015)  Admitted 11/1 with 30 lb weight increase since August, SOB, worsening edema. Failed high dose lasix and metolazone + milrinone w/inability to achieve neg fluid balance.Creatinine had been 2.4-2.7 during the admission.  Renal consulted for CRRT.  Pt  acknowledged/accepted that she could end up on dialysis permanently. Catheter placed and CRRT started 11/7. Norepi added 11/10 for BP but stopped 11/11. Aline 11/11 for accurate BP monitoring.   Assessment/Recommendations   1. Diuretic refractory CHF/volume overload - CRRT started late 11/7 - goal 300 off. Was tolerating until acute hypotensive episode yesterday- see below.  Have temporarily stopped CRRT --> K 5.3, will need to restart at some point but would like so sort out shock picture first.  2. Shock: Would be strange for purely hypovolemic shock to cause such an acute decompensation on CRRT --> usually BP gets gradually lower (even with RV dysfunction).  Given pressor requirement, increased WBC ct, believe there may be a component of septic shock too.  LE dopplers pending to look at DVT.  On vanc/ cefepime (11/17-).  ? If Leg pain is actually RLQ pain, unclear.  Consider CT for eval of intra-abdominal source if LE dopplers unrevealing.   3. CKD4 -Baseline creatinine has been worsening over the course of 2017 - most recently  2.4-2.6.  Patient would be interested in [facility] as HD site.  No renal recovery, she is essentially ESRD.  4. Chronic dCHF. EF 60-65%. Suspicion primary RV failure. ECHO this admission "nl RV fx" but RV not well visualized. Milrinone d/c'ed 11/13.  5. AS - "moderate" on TTE. Dr. Aundra Dubin indicates that TEE best way to assess.  6. Pacemaker status 7. AFib - Heparin with CRRT--> on systemic gtt  now that CRRT stopped 8. Anemia - h/o AVM's. Fe deficient. Dosed with feraheme 11/7. Transfused 1 unit PRBC's 11/8, 2 u 11/3. Started Aranesp 100/week 1st dosed 93/5.  9. Metabolic bone - PTH 50, no intervention needed. Ca and phos OK 10. OHS/OSA - CPAP at night. Has own machine. 11. Super morbid obesity. Need to keep her mobile. 12. Dispo: unclear as of yet.  [name] [facility] [phone] pgr [phone] 03/14/2016, 10:02 AM

## 2016-03-14 NOTE — Progress Notes (Signed)
Hypoglycemic Event  CBG: 67  Treatment: D50 IV 50 mL  Symptoms: Pale  Follow-up CBG: [id number] CBG Result:153  Possible Reasons for Event: Inadequate meal intake      Blondell Reveal

## 2016-03-14 NOTE — Progress Notes (Addendum)
ANTICOAGULATION CONSULT NOTE - Follow-Up Consult  Pharmacy Consult for heparin Indication: Afib and concern for new clot s/p arrest  Allergies  Allergen Reactions  . Adhesive [Tape] Other (See Comments)    Tears skin.  Please use "paper" tape  . Amoxicillin Hives  . Contrast Media [Iodinated Diagnostic Agents] Hives  . Shellfish-Derived Products Hives    Can eat crab cakes  . Benadryl [Diphenhydramine Hcl] Rash    Patient Measurements: Height: 5' 4" (162.6 cm) Weight: 260 lb (117.9 kg) IBW/kg (Calculated) : 54.7 Heparin Dosing Weight: 85kg  Vital Signs: Temp: 98.6 F (37 C) (11/17 1205) Temp Source: Axillary (11/17 1205) BP: 108/31 (11/17 1152) Pulse Rate: 64 (11/17 1200)  Labs:  Recent Labs  03/12/16 0414  03/13/16 0410  03/13/16 2157 03/14/16 0225 03/14/16 0237 03/14/16 0700 03/14/16 1226  HGB 9.2*  --  9.7*  --  8.6*  --  8.5*  --   --   HCT 31.1*  --  31.9*  --  28.7*  --  27.9*  --   --   PLT 106*  --  95*  --  118*  --  120*  --   --   APTT 189*  --  >200*  --   --   --  56*  --   --   HEPARINUNFRC  --   --   --   --   --  <0.10*  --   --  0.33  CREATININE 1.98*  < > 2.11*  < > 1.97* 2.75*  --  3.15*  --   TROPONINI  --   --   --   --  0.03*  --   --   --   --   < > = values in this interval not displayed.  Estimated Creatinine Clearance: 22.2 mL/min (by C-G formula based on SCr of 3.15 mg/dL (H)).   Medical History: Past Medical History:  Diagnosis Date  . Afib ([place])   . Aortic stenosis   . CHF (congestive heart failure) ([place])   . Chronic kidney disease    Stage 3  . COPD (chronic obstructive pulmonary disease) ([place])   . Diabetes mellitus without complication ([place])   . Hypertension   . Pacemaker     Medications:  Prescriptions Prior to Admission  Medication Sig Dispense Refill Last Dose  . acetaminophen (TYLENOL) 325 MG tablet Take 1-2 tablets (325-650 mg total) by mouth every 6 (six) hours as needed for mild pain.   03/08/2016 at Unknown  time  . albuterol (PROVENTIL HFA;VENTOLIN HFA) 108 (90 Base) MCG/ACT inhaler Inhale 1 puff into the lungs every 6 (six) hours as needed for wheezing or shortness of breath.   Past Week at Unknown time  . albuterol (PROVENTIL) (2.5 MG/3ML) 0.083% nebulizer solution Take 2.5 mg by nebulization every 8 (eight) hours as needed for wheezing.   0 Past Month at Unknown time  . allopurinol (ZYLOPRIM) 100 MG tablet Take 50 mg by mouth daily.    03/24/2016 at Unknown time  . atorvastatin (LIPITOR) 20 MG tablet Take 20 mg by mouth daily.   03/26/2016 at Unknown time  . budesonide-formoterol (SYMBICORT) 80-4.5 MCG/ACT inhaler Inhale 2 puffs into the lungs 2 (two) times daily.   03/05/2016 at Unknown time  . Cholecalciferol (VITAMIN D3) 5000 units CAPS Take 1 capsule by mouth daily.   02/27/2016 at Unknown time  . cyanocobalamin 500 MCG tablet Take 500 mcg by mouth daily.   02/27/2016 at Unknown time  .  Exenatide ER (BYDUREON) 2 MG PEN Inject 2 mg into the skin every Saturday. Take every Saturday. Patient states she taken today (03/18/2016) because she forgot to take it this past Saturday   03/09/2016 at Unknown time  . ferrous sulfate 325 (65 FE) MG tablet Take 1 tablet (325 mg total) by mouth 3 (three) times daily with meals. 90 tablet 3 03/10/2016 at Unknown time  . ipratropium-albuterol (DUONEB) 0.5-2.5 (3) MG/3ML SOLN Inhale 3 mLs into the lungs every 6 (six) hours as needed (for shortness of breath).   5 02/27/2016 at Unknown time  . loratadine (CLARITIN) 10 MG tablet Take 10 mg by mouth daily.   03/22/2016 at Unknown time  . metolazone (ZAROXOLYN) 2.5 MG tablet Take 1 tablet (2.5 mg total) by mouth as needed (3 lb weight gain overnight or 5lb week gain in a week). As directed by HF clinic. 10 tablet 3 Past Week at Unknown time  . metoprolol succinate (TOPROL-XL) 25 MG 24 hr tablet Take 1 tablet (25 mg total) by mouth daily. 60 tablet 3 03/23/2016 at 0900  . Multiple Vitamin (MULTIVITAMIN WITH MINERALS) TABS tablet Take 1  tablet by mouth daily. Reported on 07/09/2015   02/27/2016 at Unknown time  . potassium chloride SA (K-DUR,KLOR-CON) 20 MEQ tablet Take 2 tablets (40 mEq total) by mouth 2 (two) times daily. Take an extra 40 meq when you take Metolazone 130 tablet 6 03/25/2016 at Unknown time  . torsemide (DEMADEX) 20 MG tablet Take 4 tablets (80 mg total) by mouth 2 (two) times daily. 240 tablet 6 03/09/2016 at Unknown time  . vitamin C (ASCORBIC ACID) 500 MG tablet Take 1 tablet (500 mg total) by mouth 2 (two) times daily. 60 tablet 0 02/27/2016 at Unknown time  . XARELTO 15 MG TABS tablet TAKE 1 TABLET BY MOUTH EVERY DAY WITH SUPPER 30 tablet 6 02/27/2016 at 2200   Scheduled:  . arformoterol  15 mcg Nebulization BID  . atorvastatin  20 mg Per Tube q1800  . budesonide (PULMICORT) nebulizer solution  0.5 mg Nebulization BID  . [START ON 03/15/2016] ceFEPime (MAXIPIME) IV  500 mg Intravenous Q24H  . chlorhexidine gluconate (MEDLINE KIT)  15 mL Mouth Rinse BID  . cholecalciferol  5,000 Units Oral Daily  . darbepoetin (ARANESP) injection - NON-DIALYSIS  100 mcg Subcutaneous Q Wed-1800  . famotidine (PEPCID) IV  20 mg Intravenous Q24H  . ipratropium-albuterol  3 mL Inhalation Q6H  . mouth rinse  15 mL Mouth Rinse BID  . midodrine  5 mg Per Tube TID WC  . multivitamin with minerals  1 tablet Oral Daily  . sodium chloride flush  10-40 mL Intracatheter Q12H  . cyanocobalamin  500 mcg Oral Daily  . vitamin C  500 mg Oral BID   Infusions:  . DOPamine Stopped (03/14/16 0945)  . fentaNYL infusion INTRAVENOUS 25 mcg/hr (03/14/16 1241)  . heparin 10,000 units/ 20 mL infusion syringe 900 Units/hr (03/13/16 2000)  . heparin 1,200 Units/hr (03/14/16 0448)  . norepinephrine (LEVOPHED) Adult infusion 35 mcg/min (03/14/16 1155)  . dialysis replacement fluid (prismasate) 300 mL/hr at 03/13/16 1606  . dialysis replacement fluid (prismasate) 200 mL/hr at 03/13/16 1818  . dialysate (PRISMASATE) 1,000 mL/hr at 03/13/16 1205     Assessment: [name] on Xarelto PTA for Afib which has been held since 11/6 when CRRT started along with heparin for volume removal. CRRT stopped 11/16 and code blue initiated requiring intubation and pressors. Concern for CVA but per neuro this is unlikely,  and patient reported pain in R thigh so dopplers ordered to rule out clot and heparin drip initiated overnight. Heparin level currently therapeutic at 0.33, CBC and platelets low but stable, D-dimer 1.89, dopplers pending.   Goal of Therapy:  Heparin level 0.3-0.5 units/ml Monitor platelets by anticoagulation protocol: Yes   Plan:  -Continue heparin 1200 units/hr -Follow-up 8/hr confirmatory heparin level -Monitor heparin level, CBC, S/Sx bleeding daily  Arrie Senate, PharmD PGY-1 Pharmacy Resident Pager: [phone] 03/14/2016

## 2016-03-14 NOTE — Progress Notes (Signed)
Upon stopping CRRT and returning blood to pt. Pt complained of having "double vision" and other vision changes. She then went non responsive with agonal breathing. Code was called, please see sheet for details. Pt never lost pulse, but required intubation. CCMD and Cardiology at bedside please seen physician notes for details. Will continue to monitor pt.

## 2016-03-14 NOTE — Progress Notes (Signed)
Hypoglycemic Event  CBG: 25  Treatment: D50 IV 50 mL  Symptoms: None  Follow-up CBG: Time: CBG Result:130  Possible Reasons for Event: unknown  Comments/MD notified:CCM aware.     Blondell Reveal

## 2016-03-14 NOTE — Progress Notes (Signed)
CRITICAL VALUE ALERT  Critical value received:  Lactic Acid 5.8  Date of notification:  03/13/16  Time of notification:  2200  Critical value read back: yes   Nurse who received alert:  Idelia Salm   Cardiology Fellow at bedside and aware of lab result.

## 2016-03-14 NOTE — Progress Notes (Signed)
PCCM Interval Note  Re-evaluated patient this pm. She continue to have high norepi requirement. Has a rising lactate (off CVVHD since last pm 11/16) without any clear evidence for an ischemic focus. She is a bit less responsive than this am. She denies abd pain.   Vitals:   03/14/16 1152 03/14/16 1200 03/14/16 1205 03/14/16 1300  BP: (!) 108/31     Pulse: 68 64  64  Resp: (!) 22 17  (!) 22  Temp:   98.6 F (37 C)   TempSrc:   Axillary   SpO2:  100%  100%  Weight:      Height:       Gen: Obese woman, intubated, poorly responsive  ENT: ETT, OP clear  Lungs: Mild accessory muscle use, decreased B BS  Cardiovascular: RRR, distant, trace peripheral edema  Abdomen: obese, soft and NT, no guarding, no rebound.   Musculoskeletal: No deformities, no cyanosis or clubbing  Neuro: alert, non focal  Skin: Warm, no lesions or rashes   Recent Labs Lab 03/13/16 0410 03/13/16 2157 03/14/16 0237  HGB 9.7* 8.6* 8.5*  HCT 31.9* 28.7* 27.9*  WBC 9.0 12.7* 18.8*  PLT 95* 118* 120*    Recent Labs Lab 03/11/16 0449  03/12/16 0414 03/12/16 1615 03/13/16 0410 03/13/16 1623 03/13/16 2157 03/14/16 0225 03/14/16 0237 03/14/16 0700  NA 133*  < > 132* 130* 133* 132* 134* 128*  --  130*  K 4.4  < > 4.3 4.3 4.6 4.9 4.3 5.3*  --  5.7*  CL 101  < > 100* 98* 100* 100* 107 100*  --  101  CO2 26  < > 24 24 27 25  18* 16*  --  16*  GLUCOSE 95  < > 83 101* 96 121* 126* 164*  --  122*  BUN 17  < > 17 14 17 17 15 20   --  22*  CREATININE 2.01*  < > 1.98* 2.14* 2.11* 2.18* 1.97* 2.75*  --  3.15*  CALCIUM 8.8*  < > 8.9 9.0 9.3 9.5 7.8* 9.1  --  9.3  MG 2.4  --  2.4  --  2.4  --  2.3  --  2.5*  --   PHOS 2.9  < > 3.4 3.3 4.1 4.7*  --  7.0*  --   --   < > = values in this interval not displayed.  Recent Labs Lab 03/12/16 0414 03/12/16 1615 03/13/16 0410 03/13/16 1623 03/14/16 0225  ALBUMIN 2.6* 2.6* 2.7* 2.7* 2.6*    Impressions:  Acute resp failure - appears to be stable.  She has  shock, presumed related to hypovolemia in setting sedating meds, positive pressure, cardiogenic component. Progressive lactic acidosis without a clear ischemic source. CVP 4-5.   Plans:  Check ABG and BMP, lactate, co-ox Will speak to nephrology regarding possible restart CVVHD today Will speak with cardiology regarding any possible benefit inotropes based on co-ox results Give Na bicarb now, gtt until we can start HD Check CT abdomen to eval for bowel ischemia given rising lactate.  Stress dose steroids after cortisol drawn.  Follow closely  Independent CC time 40 minutes   Baltazar Apo, MD, PhD 03/14/2016, 2:09 PM [facility] [phone] or if no answer [phone]

## 2016-03-14 NOTE — Consult Note (Signed)
NEURO HOSPITALIST CONSULT NOTE   Requestig physician: Dr. Haroldine Laws  Reason for Consult:Blurry vision prior to respiratory arrest  History obtained from:   Chart   HPI:                                                                                                                                          Robyn Porter is an 65 y.o. female who intially presented on 11/1 for management of marked volume overload in the setting of chronic systolic CHF. Her treatment this admission was confounded by sluggish diuresis despite addition of milrinone and switch to Lasix gtt. She was placed on CRRT (CVVH) after renal consult on 11/7. Total weight off as of 11/16 was 125 lbs. Today she had some right upper thigh pain, which began after she completed PT. Now on heparin gtt while getting CVVH. Her PMHx also includes stage 4 CKD, chronic atrial fibrillation (on Xarelto as outpatient), OSA, anemia, chronic venous stasis, symptomatic bradycardia and morbid obesity.   This evening the patient experienced respiratory failure and loss of consciousness. She had reported double and blurry vision prior to LOC, as well as 10/10 leg cramps. During the acute event, systolic blood pressure from cuff was in the low 60's with decreased MAP. At time of intial physician evaluation following the event (while she was intubated and unconscious), she was unresponsive to stimuli with constricted pupils that were not responsive to light. She eventually regained consciousness during her evaluation. After arterial line was placed, her initial systolic pressure reading was noted to be 104.   The most likely etiology for her LOC was felt to be syncope per primary team notes, with unclear underlying etiology - possible mechanisms documented include opiate effect. Brainstem stroke or transient brainstem hypoperfusion due to vertebrobasilar insufficiency was also on the DDx   She was sent for emergent CT which showed an  equivocal hypodensity along the left occipital pole, also compatible with volume averaging artifact from a prominent sulcus. This was felt to be worrisome by the primary team given her initial symptom of blurred vision prior to the respiratory arrest.     Past Medical History:  Diagnosis Date  . Afib ([place])   . Aortic stenosis   . CHF (congestive heart failure) ([place])   . Chronic kidney disease    Stage 3  . COPD (chronic obstructive pulmonary disease) ([place])   . Diabetes mellitus without complication ([place])   . Hypertension   . Pacemaker     Past Surgical History:  Procedure Laterality Date  . ABDOMINAL HYSTERECTOMY    . BIV PACEMAKER GENERATOR CHANGE OUT  09/19/2015  . COLONOSCOPY N/A 06/29/2015   Procedure: COLONOSCOPY;  Surgeon: Irene Shipper, MD;  Location: [facility] ENDOSCOPY;  Service: Endoscopy;  Laterality: N/A;  .  COLONOSCOPY N/A 12/03/2015   Procedure: COLONOSCOPY;  Surgeon: Gatha Mayer, MD;  Location: [place];  Service: Endoscopy;  Laterality: N/A;  . EP IMPLANTABLE DEVICE N/A 09/19/2015   Procedure: BiV Pacemaker Insertion CRT-P;  Surgeon: Will [name], MD;  Location: [place] CV LAB;  Service: Cardiovascular;  Laterality: N/A;  . ESOPHAGOGASTRODUODENOSCOPY N/A 11/29/2015   Procedure: ESOPHAGOGASTRODUODENOSCOPY (EGD);  Surgeon: Manus Gunning, MD;  Location: [place];  Service: Gastroenterology;  Laterality: N/A;  . GIVENS CAPSULE STUDY N/A 11/30/2015   Procedure: GIVENS CAPSULE STUDY;  Surgeon: Manus Gunning, MD;  Location: [place];  Service: Gastroenterology;  Laterality: N/A;  . HOT HEMOSTASIS N/A 12/03/2015   Procedure: HOT HEMOSTASIS (ARGON PLASMA COAGULATION/BICAP);  Surgeon: Gatha Mayer, MD;  Location: [facility] ENDOSCOPY;  Service: Endoscopy;  Laterality: N/A;  . PACEMAKER INSERTION      Family History  Problem Relation Age of Onset  . Angina Sister   . Emphysema Sister   . Colon cancer Brother     Social History:  reports that she is a  non-smoker but has been exposed to tobacco smoke. She has never used smokeless tobacco. She reports that she does not drink alcohol or use drugs.  Allergies  Allergen Reactions  . Adhesive [Tape] Other (See Comments)    Tears skin.  Please use "paper" tape  . Amoxicillin Hives  . Contrast Media [Iodinated Diagnostic Agents] Hives  . Shellfish-Derived Products Hives    Can eat crab cakes  . Benadryl [Diphenhydramine Hcl] Rash    MEDICATIONS:                                                                                                                      Current Facility-Administered Medications:  .  Place/Maintain arterial line, , , Until Discontinued **AND** 0.9 %  sodium chloride infusion, , Intra-arterial, PRN, Larey Dresser, MD .  acetaminophen (TYLENOL) tablet 325-650 mg, 325-650 mg, Oral, Q6H PRN, Satira Mccallum Tillery, PA-C, 650 mg at 03/12/16 2301 .  arformoterol (BROVANA) nebulizer solution 15 mcg, 15 mcg, Nebulization, BID, Rahul P Desai, PA-C .  atorvastatin (LIPITOR) tablet 20 mg, 20 mg, Per Tube, q1800, Rahul P Desai, PA-C .  budesonide (PULMICORT) nebulizer solution 0.5 mg, 0.5 mg, Nebulization, BID, Rahul P Desai, PA-C .  [START ON 03/15/2016] ceFEPIme (MAXIPIME) 2 g in dextrose 5 % 50 mL IVPB, 2 g, Intravenous, Q24H, Veronda P Bryk, RPH .  chlorhexidine gluconate (MEDLINE KIT) (PERIDEX) 0.12 % solution 15 mL, 15 mL, Mouth Rinse, BID, Jolaine Artist, MD .  cholecalciferol (VITAMIN D) tablet 5,000 Units, 5,000 Units, Oral, Daily, Shirley Friar, PA-C, 5,000 Units at 03/13/16 1007 .  Darbepoetin Alfa (ARANESP) injection 100 mcg, 100 mcg, Subcutaneous, Q Wed-1800, Jamal Maes, MD, 100 mcg at 03/12/16 1727 .  famotidine (PEPCID) IVPB 20 mg premix, 20 mg, Intravenous, Q24H, Rahul P Desai, PA-C, 20 mg at 03/14/16 0444 .  fentaNYL (SUBLIMAZE) bolus via infusion 25 mcg, 25 mcg, Intravenous, Q1H  PRN, Rahul P Desai, PA-C .  fentaNYL 2552mg in NS 2568m(1092mml)  infusion-PREMIX, 25-400 mcg/hr, Intravenous, Continuous, Rahul P Desai, PA-C, Last Rate: 12.5 mL/hr at 03/14/16 0100, 125 mcg/hr at 03/14/16 0100 .  heparin 10,000 units/ 20 mL infusion syringe, 250-3,000 Units/hr, CRRT, Continuous, CynJamal MaesD, Last Rate: 1.8 mL/hr at 03/13/16 2000, 900 Units/hr at 03/13/16 2000 .  heparin ADULT infusion 100 units/mL (25000 units/250m56mdium chloride 0.45%), 1,200 Units/hr, Intravenous, Continuous, VeroLaren EvertsH, Last Rate: 12 mL/hr at 03/14/16 0448, 1,200 Units/hr at 03/14/16 0448 .  heparin bolus via infusion syringe 1,000 Units, 1,000 Units, CRRT, PRN, CyntJamal Maes .  heparin injection 1,000-6,000 Units, 1,000-6,000 Units, CRRT, PRN, CyntJamal Maes, 1,000 Units at 03/14/16 0047 .  heparinized saline (2000 units/L) primer fluid for CRRT, , CRRT, PRN, CyntJamal Maes .  ipratropium-albuterol (DUONEB) 0.5-2.5 (3) MG/3ML nebulizer solution 3 mL, 3 mL, Inhalation, Q6H, Rahul P Desai, PA-C .  MEDLINE mouth rinse, 15 mL, Mouth Rinse, BID, DaniJolaine Artist, 15 mL at 03/13/16 1000 .  MEDLINE mouth rinse, 15 mL, Mouth Rinse, QID, DaniJolaine Artist, 15 mL at 03/14/16 0400 .  midazolam (VERSED) injection 1 mg, 1 mg, Intravenous, Q2H PRN, Rahul P Desai, PA-C, 1 mg at 03/14/16 0000 .  midodrine (PROAMATINE) tablet 5 mg, 5 mg, Per Tube, TID WC, Rahul P Desai, PA-C .  multivitamin with minerals tablet 1 tablet, 1 tablet, Oral, Daily, MichSatira Mccallumlery, PA-C, 1 tablet at 03/13/16 1019 .  norepinephrine (LEVOPHED) 16 mg in dextrose 5 % 250 mL (0.064 mg/mL) infusion, 2-50 mcg/min, Intravenous, Continuous, DaniJolaine Artist, Last Rate: 28.1 mL/hr at 03/14/16 0440, 30 mcg/min at 03/14/16 0440 .  ondansetron (ZOFRAN) injection 4 mg, 4 mg, Intravenous, Q6H PRN, MichSatira Mccallumlery, PA-C, 4 mg at 03/13/16 1714 .  prismasol BGK 4/2.5 5,000 mL dialysis replacement fluid, , CRRT, Continuous, CyntJamal Maes, Last Rate: 300 mL/hr at  03/13/16 1606 .  prismasol BGK 4/2.5 5,000 mL dialysis replacement fluid, , CRRT, Continuous, CyntJamal Maes, Last Rate: 200 mL/hr at 03/13/16 1818 .  prismasol BGK 4/2.5 5,000 mL dialysis solution, , CRRT, Continuous, CyntJamal Maes, Last Rate: 1,000 mL/hr at 03/13/16 1205 .  sodium chloride flush (NS) 0.9 % injection 10-40 mL, 10-40 mL, Intracatheter, Q12H, DaltLarey Dresser, 10 mL at 03/13/16 2200 .  sodium chloride flush (NS) 0.9 % injection 10-40 mL, 10-40 mL, Intracatheter, PRN, DaltLarey Dresser .  vitamin B-12 (CYANOCOBALAMIN) tablet 500 mcg, 500 mcg, Oral, Daily, MichSatira Mccallumlery, PA-C, 500 mcg at 03/13/16 1008 .  vitamin C (ASCORBIC ACID) tablet 500 mg, 500 mg, Oral, BID, MichSatira Mccallumlery, PA-C, 500 mg at 03/13/16 1008   ROS:  Unable to obtain ROS due to patient intubation.    Blood pressure (!) 97/41, pulse (!) 59, temperature 97.5 F (36.4 C), temperature source Oral, resp. rate 20, height _0  (1.626 m), weight 125.7 kg (277 lb 1.6 oz), SpO2 100 %.   Neurologic Examination:                                                                                                      HEENT-  Normocephalic/atraumatic.  Lungs- Intubated Extremities- Prominent venous stasis changes distal lower extremities.    Neurological Examination Mental Status: Intubated. Drowsy to somnolent in context of sedation with Fentanyl. Awakens to light noxious stimuli and will follow commands such as squeezing examiner's fingers and deviating eyes to right and left. Attempts to mouth statements but these are unintelligible.  Cranial Nerves: II: Deviates eyes to left and right in response to peripheral visual stimuli. Fixates on visual stimuli appropriately. PERRL.  III,IV, VI: Ptosis not present. Able to gaze to left and right to command. No nystagmus.   V,VII: Grimaces to command symmetrically. Responds to tactile stimulation bilaterally.  VIII: hearing intact to commands IX,X: intubated XI: Head rotates to left and right with equal strength XII: intubated Motor: Grips examiners hand with 4/5 strength bilaterally. Flexes legs backwards to command bilaterally without elevating above bed, but symmetric with no delay in initiating movement and can forcefully withdraw. Morbid obesity confounds the lower extremity exam.  Sensory: Reacts to sensory stimuli in all 4 extremities.  Deep Tendon Reflexes: Hypoactive in context of morbid obesity limiting the ability to elicit Cerebellar: Unable to assess.  Gait: Unable to assess.    Lab Results: Basic Metabolic Panel:  Recent Labs Lab 03/10/16 0445  03/11/16 0449 03/11/16 1545 03/12/16 0414 03/12/16 1615 03/13/16 0410 03/13/16 1623 03/13/16 2157  NA 133*  < > 133* 130* 132* 130* 133* 132* 134*  K 3.8  < > 4.4 4.3 4.3 4.3 4.6 4.9 4.3  CL 101  < > 101 98* 100* 98* 100* 100* 107  CO2 23  < > _1 18*  GLUCOSE 97  < > 95 103* 83 101* 96 121* 126*  BUN 17  < > _2 CREATININE 1.85*  < > 2.01* 2.17* 1.98* 2.14* 2.11* 2.18* 1.97*  CALCIUM 8.6*  < > 8.8* 9.0 8.9 9.0 9.3 9.5 7.8*  MG 2.4  --  2.4  --  2.4  --  2.4  --  2.3  PHOS 2.3*  < > 2.9 3.2 3.4 3.3 4.1 4.7*  --   < > = values in this interval not displayed.  Liver Function Tests:  Recent Labs Lab 03/11/16 1545 03/12/16 0414 03/12/16 1615 03/13/16 0410 03/13/16 1623  ALBUMIN 2.7* 2.6* 2.6* 2.7* 2.7*   No results for input(s): LIPASE, AMYLASE in the last 168 hours. No results for input(s): AMMONIA in the last 168 hours.  CBC:  Recent Labs Lab 03/09/16 0615 03/10/16 0445 03/11/16 0447 03/12/16 0414 03/13/16 0410 03/13/16 2157  WBC 8.2 7.4 8.2 7.5  9.0 12.7*  NEUTROABS 5.7 4.8 5.2 4.7 5.5  --   HGB 8.3* 8.8* 9.2* 9.2* 9.7* 8.6*  HCT 27.3* 28.0* 30.2* 31.1* 31.9* 28.7*  MCV 102.2* 102.2*  103.1* 102.3* 101.3* 102.9*  PLT 132* 115* 103* 106* 95* 118*    Cardiac Enzymes:  Recent Labs Lab 03/13/16 2157  TROPONINI 0.03*    Lipid Panel: No results for input(s): CHOL, TRIG, HDL, CHOLHDL, VLDL, LDLCALC in the last 168 hours.  CBG:  Recent Labs Lab 03/13/16 2326  GLUCAP 111*    Microbiology: Results for orders placed or performed during the hospital encounter of 03/25/2016  MRSA PCR Screening     Status: None   Collection Time: 03/19/2016  6:25 PM  Result Value Ref Range Status   MRSA by PCR NEGATIVE NEGATIVE Final    Comment:        The GeneXpert MRSA Assay (FDA approved for NASAL specimens only), is one component of a comprehensive MRSA colonization surveillance program. It is not intended to diagnose MRSA infection nor to guide or monitor treatment for MRSA infections.     Coagulation Studies: No results for input(s): LABPROT, INR in the last 72 hours.  Imaging: Portable Chest Xray  Result Date: 03/13/2016 CLINICAL DATA:  Recent respiratory arrest EXAM: PORTABLE CHEST 1 VIEW COMPARISON:  03/04/2016 FINDINGS: Endotracheal tube is 2.2 cm above the carina. The right jugular sheath extends into the low SVC. Transvenous cardiac leads appear intact. The lungs are clear. No large effusion. No pneumothorax. Normal pulmonary vasculature. Unchanged cardiomegaly. IMPRESSION: 1.  Support equipment appears satisfactorily positioned. 2. Unchanged cardiomegaly.  No consolidation or effusion. Electronically Signed   By: Andreas Newport M.D.   On: 03/13/2016 22:22   Ct Head Code Stroke Wo Contrast  Result Date: 03/13/2016 CLINICAL DATA:  Code stroke. Initial evaluation for blurry vision, double vision, respiratory arrest. EXAM: CT HEAD WITHOUT CONTRAST TECHNIQUE: Contiguous axial images were obtained from the base of the skull through the vertex without intravenous contrast. COMPARISON:  None available. FINDINGS: Brain: Cerebral volume normal. Mild chronic microvascular  ischemic disease within the periventricular white matter. No acute intracranial hemorrhage. There is question of subtle loss of gray-white matter differentiation within the left occipital pole (series 2, image 12). Unclear whether this is artifactual nature versus possible early ischemic changes. Otherwise, gray-white matter differentiation maintained. Vascular: No hyperdense vessel. Skull: Scalp soft tissues within normal limits.  Calvarium intact. Sinuses/Orbits: Globes and orbits within normal limits. Small fluid level noted within the right maxilla sinus. Paranasal sinuses are otherwise clear. No mastoid effusion. ASPECTS [facility] Stroke Program Early CT Score) - Ganglionic level infarction (caudate, lentiform nuclei, internal capsule, insula, M1-M3 cortex): 7 - Supraganglionic infarction (M4-M6 cortex): 3 Total score (0-10 with 10 being normal): 10 IMPRESSION: 1. Question subtle blurring of the gray-white matter differentiation within the left occipital pole. While this finding may be artifactual in nature as there is some motion artifact on this exam, possible early/evolving ischemia not entirely excluded. This could be further evaluated with dedicated MRI as clinically indicated. 2. No other acute intracranial process. 3. ASPECTS is 10 Critical Value/emergent results were called by telephone at the time of interpretation on 03/13/2016 at 11:35 pm to Dr. Cheral Marker , who verbally acknowledged these results. Electronically Signed   By: Jeannine Boga M.D.   On: 03/13/2016 23:41   Assessment: 1. Blurred and double vision preceding LOC and respiratory arrest. Possible mechanisms for LOC and respiratory arrest include include opiate effect and vertebrobasilar insufficiency. However,  the documented transient severe hypotension is felt to be the most likely etiology for the sudden change, and also was likely the reason for her vision complaints prior to losing consciousness. Not likely to be due to seizure as  no tonic/clonic activity documented. Brainstem stroke essentially ruled out by exam, which shows that the patient is arousable with no abnormal brainstem findings.  3. CT head reveals a faint hypodensity along the left occipital pole, which is also compatible with volume averaging artifact from a prominent sulcus. This was felt to be worrisome by the primary team given her initial symptom of blurred vision prior to the respiratory arrest, but should have resulted in a visual field cut rather than blurring if it represented an occipital lobe infarction. On bedside exam limited by intubation, no visual field cut is seen.   4. Atrial fibrillation. On IV heparin.    Recommendations: 1. Supportive care.  2. Continue heparin or other anticoagulant.  3. After extubation, visual fields can be better assessed. If a hemianopsia or quadrantanopsia is noted, then an MRI brain should be obtained, if the patient is able to tolerate.    03/14/2016, 12:09 AM

## 2016-03-14 NOTE — Progress Notes (Signed)
Initial Nutrition Assessment  DOCUMENTATION CODES:   Morbid obesity  INTERVENTION:   -If pt unable to be extubated within 48 hours of intubation, recommend:  Initiate TF via OGT with Vital High Protein at goal rate of 40 ml/h (960 ml per day) and Prostat 60 ml BID to provide 1360 kcals, 144 gm protein, 803 ml free water daily.  NUTRITION DIAGNOSIS:   Inadequate oral intake related to inability to eat as evidenced by NPO status.  GOAL:   Patient will meet greater than or equal to 90% of their needs  MONITOR:   Vent status, Labs, Weight trends, Skin, I & O's  REASON FOR ASSESSMENT:   Ventilator    ASSESSMENT:   Robyn Porter is a 65 y.o. female chronic dCHF, AS, chronic afib on Xarelto (at home, not on anticoag here except with CRRT), CKD-4, Hx of GI bleeds, and OHS/OSA who was admitted with marked fluid overload requiring CRRT. Patient had hypotension, progressive lethargy, developed progressive dyspnea and respiratory arrest. Differential diagnosis are CVA (hx of Afib but PTT>200), Opiate (on high dose norco here), ACS, Pneumonia  Patient is currently intubated on ventilator support.  MV: 11.9 L/min Temp (24hrs), Avg:98.1 F (36.7 C), Min:97.5 F (36.4 C), Max:98.6 F (37 C)  Pt placed on CRRT on 03/04/16; CRRT stopped today with plans to convert to iHD on 03/15/16 per nephrology notes.   Case discussed with RN. Pt was intubated due to respiratory distress last night ("was gunky"). Pt is currently weaning with likely plans to extubate later today or early tomorrow AM. Per RN, feeding tube placement was attempted last night, but pt did not tolerate well. No plans to start nutrition support today related to potential extubation.   Pt with marked wt loss during admission, related to diuresis and CRRT.   Medications reviewed and include MVI, vitamin D, vitamin B-12, vitamin C, and levophed.   Labs reviewed: Na: 130, K: 5.7.  Diet Order:  Diet NPO time specified  Skin:   Wound (see comment) (DPTI on sacrum)  Last BM:  03/13/16  Height:   Ht Readings from Last 1 Encounters:  03/02/16 5\' 4"  (1.626 m)    Weight:   Wt Readings from Last 1 Encounters:  03/14/16 260 lb (117.9 kg)    Ideal Body Weight:  54.5 kg  BMI:  Body mass index is 44.63 kg/m.  Estimated Nutritional Needs:   Kcal:  [id number]  Protein:  >136 grams  Fluid:  > 1.2 L  EDUCATION NEEDS:   No education needs identified at this time  Tennyson Kallen A. Jimmye Norman, RD, LDN, CDE Pager: [phone] After hours Pager: [phone]

## 2016-03-14 NOTE — Progress Notes (Signed)
CRITICAL VALUE ALERT  Critical value received:  Lactic Acid 2.4  Date of notification:  11/17  Time of notification:  0200  Critical value read back: yes   Nurse who received alert:  Idelia Salm   MD notified: cardiology fellow notifed of change,

## 2016-03-14 NOTE — Progress Notes (Signed)
eLink Physician-Brief Progress Note Patient Name: Robyn Porter DOB: 1951-02-16 MRN: [id number]   Date of Service  03/14/2016  HPI/Events of Note  Hypotension - BP = 95/30 and HR = 65 (paced). Hgb = 6.9.  eICU Interventions  Will order: 1. Transfuse 2 units PRBC now. 2. PT/INR. PTT and Fibrinogen now. 3. Transfuse 2 units PRBC now.  4. Bolus with 0.9 NaCl 1 liter IV over 1 hour now.  5. Increase ceiling on Norepinephrine IV infusion to 70 mcg/min.     Intervention Category Major Interventions: Acid-Base disturbance - evaluation and management;Hypotension - evaluation and management  Lysle Dingwall 03/14/2016, 6:59 PM

## 2016-03-14 NOTE — Progress Notes (Signed)
eLink Physician-Brief Progress Note Patient Name: Robyn Porter DOB: 1950/07/16 MRN: [id number]   Date of Service  03/14/2016  HPI/Events of Note  Hypotension and Acidosis - CT Scan of Abd/Pelvis -  1. Massive right-sided retroperitoneal hematoma. 2. Right lower lobe dependent consolidation may represent atelectasis or pneumonia. Small right pleural effusion. 3. No inflammatory or obstructive changes of bowel.  eICU Interventions  Will order: 1. Hold Heparin IV infusion. 2. CBC now and Q 6 hours.      Intervention Category Major Interventions: Hypotension - evaluation and management;Acid-Base disturbance - evaluation and management  Jailen Lung Eugene 03/14/2016, 5:42 PM

## 2016-03-14 NOTE — Progress Notes (Signed)
PULMONARY / CRITICAL CARE MEDICINE   Name: Robyn Porter MRN: [id number] DOB: 22-Nov-1950    ADMISSION DATE:  02/29/2016 CONSULTATION DATE: 03/14/16  REFERRING MD:  Haroldine Laws, D  CHIEF COMPLAINT:  Respiratory arrest  HISTORY OF PRESENT ILLNESS:   Robyn Porter is a 65 y.o. female chronic dCHF, AS, chronic afib on Xarelto (at home), chronic anemia due to renal failure, Hx of GI bleeds, and OHS/OSA who was admitted with marked fluid overload two weeks ago. She failed diuresis with lasix gtt and started on CRRT by nephrology starting 03/04/2016. Weight down by 113 pounds over this admission. Most recent vital sign was significant for 80/44. Bmet was significant for HypoNa to 132 and sCr of 2.18. Last CXR was on 11/07 and significant for pulm edema. She hasn't had EKG or troponin this admission. She has received 2 tablets of Vicodin 5/325 mg 11/16.  11/16 pt had leg cramps and CVVHD stopped, developed diplopia and then progressive AMS. Evolved to resp arrest requiring MV. Initially was called as a code stroke  SUBJECTIVE:  Intubated Hypotensive but ? Accurate pressure Wakes to voice Head Ct > ? L occipital artifact vs early ischemia.   VITAL SIGNS: BP (!) 79/22   Pulse 64   Temp 98.3 F (36.8 C) (Axillary)   Resp (!) 21   Ht 5\' 4"  (1.626 m)   Wt 117.9 kg (260 lb)   SpO2 100%   BMI 44.63 kg/m   HEMODYNAMICS: CVP:  [4 mmHg-22 mmHg] 6 mmHg  VENTILATOR SETTINGS: Vent Mode: PRVC FiO2 (%):  [40 %-70 %] (P) 40 % Set Rate:  [12 bmp] 12 bmp Vt Set:  [500 mL] 500 mL PEEP:  [5 cmH20] 5 cmH20 Plateau Pressure:  [17 cmH20-22 cmH20] 17 cmH20  INTAKE / OUTPUT: I/O last 3 completed shifts: In: 1757.2 [P.O.:400; I.V.:757.2; IV [id number] Out: [id number] [Urine:55; Other:7295]  PHYSICAL EXAMINATION: General:  Obese, lying in bed Neuro:Sedated and intubated. Opens eyes, follows commands.  HEENT:  Normocephalic, ETT in place, pupils equal, ~3 mm, minimally reactive to  light Cardiovascular: RRR, 3/6 SEM over R and LUSB Lungs: intubated, air movement bilaterally, no wheeze or crackles Abdomen: obese, soft, limited exam Musculoskeletal: lipidemia  Skin: dry scaly skin in LE  LABS:  BMET  Recent Labs Lab 03/13/16 2157 03/14/16 0225 03/14/16 0700  NA 134* 128* 130*  K 4.3 5.3* 5.7*  CL 107 100* 101  CO2 18* 16* 16*  BUN 15 20 22*  CREATININE 1.97* 2.75* 3.15*  GLUCOSE 126* 164* 122*    Electrolytes  Recent Labs Lab 03/13/16 0410 03/13/16 1623 03/13/16 2157 03/14/16 0225 03/14/16 0237 03/14/16 0700  CALCIUM 9.3 9.5 7.8* 9.1  --  9.3  MG 2.4  --  2.3  --  2.5*  --   PHOS 4.1 4.7*  --  7.0*  --   --     CBC  Recent Labs Lab 03/13/16 0410 03/13/16 2157 03/14/16 0237  WBC 9.0 12.7* 18.8*  HGB 9.7* 8.6* 8.5*  HCT 31.9* 28.7* 27.9*  PLT 95* 118* 120*    Coag's  Recent Labs Lab 03/12/16 0414 03/13/16 0410 03/14/16 0237  APTT 189* >200* 56*    Sepsis Markers  Recent Labs Lab 03/13/16 2133 03/13/16 2335 03/14/16 0900  LATICACIDVEN 5.8* 2.4* 5.3*    ABG  Recent Labs Lab 03/13/16 2327  PHART 7.303*  PCO2ART 41.6  PO2ART 320.0*    Liver Enzymes  Recent Labs Lab 03/13/16 0410 03/13/16 1623 03/14/16 0225  ALBUMIN 2.7* 2.7*  2.6*    Cardiac Enzymes  Recent Labs Lab 03/13/16 2157  TROPONINI 0.03*    Glucose  Recent Labs Lab 03/13/16 2326 03/14/16 0753  GLUCAP 111* 112*    Imaging Portable Chest Xray  Result Date: 03/14/2016 CLINICAL DATA:  Acute hypoxic respiratory failure. EXAM: PORTABLE CHEST 1 VIEW COMPARISON:  03/13/2016 FINDINGS: Endotracheal tube and right jugular central venous catheter remain in appropriate position. Pacemaker remains in place. Low lung volumes are seen. Increased opacity in both lung bases may be due to increased atelectasis or infiltrate. No evidence of pneumothorax or pleural effusion. IMPRESSION: Decreased lung volumes with increased bibasilar atelectasis versus  infiltrates. Electronically Signed   By: Earle Gell M.D.   On: 03/14/2016 07:42   Portable Chest Xray  Result Date: 03/13/2016 CLINICAL DATA:  Recent respiratory arrest EXAM: PORTABLE CHEST 1 VIEW COMPARISON:  03/04/2016 FINDINGS: Endotracheal tube is 2.2 cm above the carina. The right jugular sheath extends into the low SVC. Transvenous cardiac leads appear intact. The lungs are clear. No large effusion. No pneumothorax. Normal pulmonary vasculature. Unchanged cardiomegaly. IMPRESSION: 1.  Support equipment appears satisfactorily positioned. 2. Unchanged cardiomegaly.  No consolidation or effusion. Electronically Signed   By: Andreas Newport M.D.   On: 03/13/2016 22:22   Ct Head Code Stroke Wo Contrast  Result Date: 03/13/2016 CLINICAL DATA:  Code stroke. Initial evaluation for blurry vision, double vision, respiratory arrest. EXAM: CT HEAD WITHOUT CONTRAST TECHNIQUE: Contiguous axial images were obtained from the base of the skull through the vertex without intravenous contrast. COMPARISON:  None available. FINDINGS: Brain: Cerebral volume normal. Mild chronic microvascular ischemic disease within the periventricular white matter. No acute intracranial hemorrhage. There is question of subtle loss of gray-white matter differentiation within the left occipital pole (series 2, image 12). Unclear whether this is artifactual nature versus possible early ischemic changes. Otherwise, gray-white matter differentiation maintained. Vascular: No hyperdense vessel. Skull: Scalp soft tissues within normal limits.  Calvarium intact. Sinuses/Orbits: Globes and orbits within normal limits. Small fluid level noted within the right maxilla sinus. Paranasal sinuses are otherwise clear. No mastoid effusion. ASPECTS [facility] Stroke Program Early CT Score) - Ganglionic level infarction (caudate, lentiform nuclei, internal capsule, insula, M1-M3 cortex): 7 - Supraganglionic infarction (M4-M6 cortex): 3 Total score (0-10  with 10 being normal): 10 IMPRESSION: 1. Question subtle blurring of the gray-white matter differentiation within the left occipital pole. While this finding may be artifactual in nature as there is some motion artifact on this exam, possible early/evolving ischemia not entirely excluded. This could be further evaluated with dedicated MRI as clinically indicated. 2. No other acute intracranial process. 3. ASPECTS is 10 Critical Value/emergent results were called by telephone at the time of interpretation on 03/13/2016 at 11:35 pm to Dr. Cheral Marker , who verbally acknowledged these results. Electronically Signed   By: Jeannine Boga M.D.   On: 03/13/2016 23:41    11/07 CXR with edema STUDIES:    CULTURES: None  ANTIBIOTICS: None  SIGNIFICANT EVENTS: 11/01: admitted due to marked fluid overload 11/07: CRRT started 11/16: respiratory arrest  LINES/TUBES: 03/04/2016: HD cath in Rt IJ 03/01/2016: Foley cath 03/13/2016: PIV 03/03/2016: A-line  DISCUSSION: Robyn Porter is a 65 y.o. female chronic dCHF, AS, chronic afib on Xarelto (at home, not on anticoag here except with CRRT), CKD-4, Hx of GI bleeds, and OHS/OSA who was admitted with marked fluid overload requiring CRRT. Patient had hypotension, progressive lethargy, developed progressive dyspnea and respiratory arrest. Differential diagnosis are CVA (hx of Afib  but PTT>200), Opiate (on high dose norco here), ACS, Pneumonia  ASSESSMENT / PLAN:  PULMONARY A: VDRF - following syncope with subsequent respiratory arrest.  Unclear etiology at this point but suspect multifactorial hypotension due to diuresis, narcs, BP meds, etc.  Doubt PE given chronic anticoagulation for A.fib (xarelto) Pulmonary edema. 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 P:   PRVC 8cc/kg Initiate SBT's 11/17 if MS allows.  VAP prevention measures. DuoNebs. Budesonide / Brovana in lieu of preadmission  symbicort. Diuresis restricted due to hypotension, restart as able Follow CXR  NEUROLOGIC A:   Syncope - unclear etiology at this point. ?opiate Acute encephalopathy - due to chemical sedation. P:   Sedation:  Fentanyl gtt / Midazolam PRN. RASS goal: 0 to -1. Daily WUA. Limit sedating meds. Appreciate Neurology eval. Would benefit from MRI brain but unsure she can get this with her BiV pacer in place.   CARDIOVASCULAR A:  Hypotension - concern cardiogenic shock. Hx dCHF (Echo from 11/5 with EF 60-65%, severe LVH), A.fib (on chronic xarelto), HTN, AS, bradycardia s/p PPM. P:  Restart levophed, goal MAP > 65. Follow CVP's, co-ox. Follow troponin, lactate. Continue midodrine. Start heparin gtt per pharmacy, holding xarelto for now Continue preadmission atorvastatin. Hold preadmission metolazone, toprol-xl, torsemide  RENAL A:   CKD - started on CRRT and will likely progress to needing full HD. Hyponatremia - hypervolemic. P:   Restart HD per volume goals and per nephrology plans. ? Whether we can transition to iHD if pressors wean to off, defer restart CVVHD Follow BMP  GASTROINTESTINAL A:   GI prophylaxis. Nutrition. Obesity. P: SUP: Pantoprazole. NPO. Nutrition consult prior to d/c for weight loss education, etc.  HEMATOLOGIC A:   Anemia. On chronic anticoagulation for A.fib (xarelto). VTE Prophylaxis. P:  Transfuse for Hgb < 7. Monitor platelet counts. Heparin gtt ordered Follow CBC  INFECTIOUS A:   No clear evidence infection Possible sepsis P:   Empiric cefepime and vanco were started 11/16 when she decompensated. No clear evidence infection, would follow clinically, cx data and d/c quickly if she recovers MS, hemodynamics  ENDOCRINE A:   No acute issues. P:   No interventions required.  Family updated: Multiple family members updated at bedside.  Interdisciplinary Family Meeting v Palliative Care Meeting:  Due by:  03/21/16.  Independent CC time 40 minutes  Baltazar Apo, MD, PhD 03/14/2016, 10:28 AM [facility] [phone] or if no answer [phone]

## 2016-03-14 NOTE — Progress Notes (Signed)
Patient ID: Robyn Porter, female   DOB: 19-May-1950, 65 y.o.   MRN: [id number]  Still with high pressor requirement this afternoon, rising lactate and low pH.  K is high.  She is currently awake/alert.  Co-ox 58%.  She has been indicating her abdomen => plan for CT abdomen to assess for bowel ischemia.    She will restart CVVH with hyperkalemia and acidosis.   Will add low dose dobutamine at 2 to increase output.    [name] 03/14/2016 4:39 PM

## 2016-03-14 NOTE — Procedures (Signed)
Arterial Catheter Insertion Procedure Note Robyn Porter [id number] 1950/07/16  Procedure: Insertion of Arterial Catheter  Indications: Blood pressure monitoring and Frequent blood sampling  Procedure Details Consent: Risks of procedure as well as the alternatives and risks of each were explained to the (patient/caregiver).  Consent for procedure obtained. Time Out: Verified patient identification, verified procedure, site/side was marked, verified correct patient position, special equipment/implants available, medications/allergies/relevent history reviewed, required imaging and test results available.  Performed  Maximum sterile technique was used including antiseptics, cap, gloves, gown, hand hygiene, mask and sheet. Skin prep: Chlorhexidine; local anesthetic administered 20 gauge catheter was inserted into right femoral artery using the Seldinger technique.  Evaluation Blood flow good; BP tracing good. Complications: No apparent complications.   Pantano Landry Robyn Porter ACNP Maryanna Shape PCCM Pager [phone] till 3 pm If no answer page [phone] 03/14/2016, 10:05 AM

## 2016-03-14 NOTE — Progress Notes (Signed)
Spoke with CCM about oral meds and RN was told to hold off on putting feeding tube down due to possible extubation. Will continue to monitor.

## 2016-03-14 NOTE — Progress Notes (Signed)
PT Cancellation Note  Patient Details Name: Robyn Porter MRN: [id number] DOB: May 31, 1950   Cancelled Treatment:    Reason Eval/Treat Not Completed: Medical issues which prohibited therapy. Pt went into respiratory arrest last night requiring intubation. Pt's nurse requesting hold PT services for today. PT will continue to f/u with pt as appropriate.   Clearnce Sorrel Soloman Mckeithan 03/14/2016, 8:06 AM Sherie Don, PT, DPT [phone]

## 2016-03-15 ENCOUNTER — Inpatient Hospital Stay (HOSPITAL_COMMUNITY): Payer: Medicare Other

## 2016-03-15 DIAGNOSIS — R578 Other shock: Secondary | ICD-10-CM

## 2016-03-15 DIAGNOSIS — H532 Diplopia: Secondary | ICD-10-CM

## 2016-03-15 DIAGNOSIS — R58 Hemorrhage, not elsewhere classified: Secondary | ICD-10-CM

## 2016-03-15 DIAGNOSIS — N170 Acute kidney failure with tubular necrosis: Secondary | ICD-10-CM

## 2016-03-15 DIAGNOSIS — G9340 Encephalopathy, unspecified: Secondary | ICD-10-CM

## 2016-03-15 DIAGNOSIS — Q8789 Other specified congenital malformation syndromes, not elsewhere classified: Secondary | ICD-10-CM

## 2016-03-15 DIAGNOSIS — R609 Edema, unspecified: Secondary | ICD-10-CM

## 2016-03-15 LAB — GLUCOSE, CAPILLARY
GLUCOSE-CAPILLARY: 101 mg/dL — AB (ref 65–99)
GLUCOSE-CAPILLARY: 128 mg/dL — AB (ref 65–99)
GLUCOSE-CAPILLARY: 153 mg/dL — AB (ref 65–99)
GLUCOSE-CAPILLARY: 67 mg/dL (ref 65–99)
GLUCOSE-CAPILLARY: 70 mg/dL (ref 65–99)
GLUCOSE-CAPILLARY: 79 mg/dL (ref 65–99)
GLUCOSE-CAPILLARY: 86 mg/dL (ref 65–99)
Glucose-Capillary: 132 mg/dL — ABNORMAL HIGH (ref 65–99)
Glucose-Capillary: 25 mg/dL — CL (ref 65–99)
Glucose-Capillary: 90 mg/dL (ref 65–99)
Glucose-Capillary: 84 mg/dL (ref 65–99)
Glucose-Capillary: 50 mg/dL — ABNORMAL LOW (ref 65–99)
Glucose-Capillary: 80 mg/dL (ref 65–99)

## 2016-03-15 LAB — VAS US CAROTID
LCCADSYS: -89 cm/s
LCCAPDIAS: -7 cm/s
LEFT ECA DIAS: -10 cm/s
LEFT VERTEBRAL DIAS: -9 cm/s
LICAPSYS: -89 cm/s
Left CCA dist dias: -10 cm/s
Left CCA prox sys: -255 cm/s
Left ICA dist dias: -20 cm/s
Left ICA dist sys: -137 cm/s
Left ICA prox dias: -9 cm/s

## 2016-03-15 LAB — LIPID PANEL
CHOL/HDL RATIO: 3.1 ratio
CHOLESTEROL: 86 mg/dL (ref 0–200)
HDL: 28 mg/dL — ABNORMAL LOW (ref >40)
LDL Cholesterol: 43 mg/dL (ref 0–99)
Triglycerides: 76 mg/dL (ref <150)
VLDL: 15 mg/dL (ref 0–40)

## 2016-03-15 LAB — BLOOD GAS, ARTERIAL
Acid-base deficit: 14.4 mmol/L — ABNORMAL HIGH (ref 0.0–2.0)
Bicarbonate: 12.4 mmol/L — ABNORMAL LOW (ref 20.0–28.0)
DRAWN BY: 10006
FIO2: 40
MECHVT: 420 mL
O2 Saturation: 97.8 %
PEEP: 5 cmH2O
PO2 ART: 116 mmHg — AB (ref 83.0–108.0)
Patient temperature: 97.3
RATE: 14 resp/min
pCO2 arterial: 33.9 mmHg (ref 32.0–48.0)
pH, Arterial: 7.183 — CL (ref 7.350–7.450)

## 2016-03-15 LAB — BASIC METABOLIC PANEL
Anion gap: 22 — ABNORMAL HIGH (ref 5–15)
BUN: 17 mg/dL (ref 6–20)
CHLORIDE: 99 mmol/L — AB (ref 101–111)
CO2: 14 mmol/L — ABNORMAL LOW (ref 22–32)
CREATININE: 2.74 mg/dL — AB (ref 0.44–1.00)
Calcium: 8.3 mg/dL — ABNORMAL LOW (ref 8.9–10.3)
GFR calc Af Amer: 20 mL/min — ABNORMAL LOW (ref >60)
GFR calc non Af Amer: 17 mL/min — ABNORMAL LOW (ref >60)
GLUCOSE: 91 mg/dL (ref 65–99)
Potassium: 4.6 mmol/L (ref 3.5–5.1)
SODIUM: 135 mmol/L (ref 135–145)

## 2016-03-15 LAB — COOXEMETRY PANEL
CARBOXYHEMOGLOBIN: 1.9 % — AB (ref 0.5–1.5)
CARBOXYHEMOGLOBIN: 1.7 % — AB (ref 0.5–1.5)
METHEMOGLOBIN: 1.3 % (ref 0.0–1.5)
Methemoglobin: 1.2 % (ref 0.0–1.5)
O2 SAT: 77.5 %
O2 Saturation: 78.8 %
TOTAL HEMOGLOBIN: 8.8 g/dL — AB (ref 12.0–16.0)
Total hemoglobin: 7.2 g/dL — ABNORMAL LOW (ref 12.0–16.0)

## 2016-03-15 LAB — POCT I-STAT 3, ART BLOOD GAS (G3+)
ACID-BASE DEFICIT: 16 mmol/L — AB (ref 0.0–2.0)
Acid-base deficit: 3 mmol/L — ABNORMAL HIGH (ref 0.0–2.0)
BICARBONATE: 11.6 mmol/L — AB (ref 20.0–28.0)
Bicarbonate: 22.8 mmol/L (ref 20.0–28.0)
O2 Saturation: 96 %
O2 Saturation: 96 %
PCO2 ART: 43.2 mmHg (ref 32.0–48.0)
PH ART: 7.161 — AB (ref 7.350–7.450)
Patient temperature: 98.6
TCO2: 13 mmol/L (ref 0–100)
TCO2: 24 mmol/L (ref 0–100)
pCO2 arterial: 32.4 mmHg (ref 32.0–48.0)
pH, Arterial: 7.33 — ABNORMAL LOW (ref 7.350–7.450)
pO2, Arterial: 102 mmHg (ref 83.0–108.0)
pO2, Arterial: 91 mmHg (ref 83.0–108.0)

## 2016-03-15 LAB — RENAL FUNCTION PANEL
Albumin: 2.6 g/dL — ABNORMAL LOW (ref 3.5–5.0)
Albumin: 2.5 g/dL — ABNORMAL LOW (ref 3.5–5.0)
Anion gap: 22 — ABNORMAL HIGH (ref 5–15)
Anion gap: 17 — ABNORMAL HIGH (ref 5–15)
BUN: 16 mg/dL (ref 6–20)
BUN: 12 mg/dL (ref 6–20)
CALCIUM: 8.3 mg/dL — AB (ref 8.9–10.3)
CALCIUM: 8 mg/dL — AB (ref 8.9–10.3)
CO2: 14 mmol/L — ABNORMAL LOW (ref 22–32)
CO2: 19 mmol/L — AB (ref 22–32)
CREATININE: 2.74 mg/dL — AB (ref 0.44–1.00)
CREATININE: 2.05 mg/dL — AB (ref 0.44–1.00)
Chloride: 99 mmol/L — ABNORMAL LOW (ref 101–111)
Chloride: 98 mmol/L — ABNORMAL LOW (ref 101–111)
GFR, EST AFRICAN AMERICAN: 20 mL/min — AB (ref >60)
GFR, EST AFRICAN AMERICAN: 28 mL/min — AB (ref >60)
GFR, EST NON AFRICAN AMERICAN: 17 mL/min — AB (ref >60)
GFR, EST NON AFRICAN AMERICAN: 24 mL/min — AB (ref >60)
Glucose, Bld: 90 mg/dL (ref 65–99)
Glucose, Bld: 68 mg/dL (ref 65–99)
Phosphorus: 7.7 mg/dL — ABNORMAL HIGH (ref 2.5–4.6)
Phosphorus: 5.7 mg/dL — ABNORMAL HIGH (ref 2.5–4.6)
Potassium: 4.6 mmol/L (ref 3.5–5.1)
Potassium: 4.5 mmol/L (ref 3.5–5.1)
SODIUM: 135 mmol/L (ref 135–145)
SODIUM: 134 mmol/L — AB (ref 135–145)

## 2016-03-15 LAB — CBC WITH DIFFERENTIAL/PLATELET
BASOS PCT: 0 %
Basophils Absolute: 0 10*3/uL (ref 0.0–0.1)
EOS PCT: 0 %
Eosinophils Absolute: 0 10*3/uL (ref 0.0–0.7)
HEMATOCRIT: 24.6 % — AB (ref 36.0–46.0)
HEMOGLOBIN: 7.5 g/dL — AB (ref 12.0–15.0)
LYMPHS PCT: 6 %
Lymphs Abs: 1.4 10*3/uL (ref 0.7–4.0)
MCH: 29.6 pg (ref 26.0–34.0)
MCHC: 30.5 g/dL (ref 30.0–36.0)
MCV: 97.2 fL (ref 78.0–100.0)
MONOS PCT: 12 %
Monocytes Absolute: 2.9 10*3/uL — ABNORMAL HIGH (ref 0.1–1.0)
NEUTROS ABS: 19.5 10*3/uL — AB (ref 1.7–7.7)
Neutrophils Relative %: 82 %
Platelets: 62 10*3/uL — ABNORMAL LOW (ref 150–400)
RBC: 2.53 MIL/uL — ABNORMAL LOW (ref 3.87–5.11)
RDW: 23 % — ABNORMAL HIGH (ref 11.5–15.5)
WBC: 23.8 10*3/uL — ABNORMAL HIGH (ref 4.0–10.5)

## 2016-03-15 LAB — PREPARE RBC (CROSSMATCH)

## 2016-03-15 LAB — CBC
HCT: 29.5 % — ABNORMAL LOW (ref 36.0–46.0)
HCT: 26.7 % — ABNORMAL LOW (ref 36.0–46.0)
HEMOGLOBIN: 8.8 g/dL — AB (ref 12.0–15.0)
Hemoglobin: 8.3 g/dL — ABNORMAL LOW (ref 12.0–15.0)
MCH: 28.9 pg (ref 26.0–34.0)
MCH: 29.3 pg (ref 26.0–34.0)
MCHC: 29.8 g/dL — ABNORMAL LOW (ref 30.0–36.0)
MCHC: 31.1 g/dL (ref 30.0–36.0)
MCV: 98.3 fL (ref 78.0–100.0)
MCV: 93 fL (ref 78.0–100.0)
PLATELETS: 55 10*3/uL — AB (ref 150–400)
PLATELETS: 90 10*3/uL — AB (ref 150–400)
RBC: 2.87 MIL/uL — AB (ref 3.87–5.11)
RBC: 3 MIL/uL — AB (ref 3.87–5.11)
RDW: 21.9 % — ABNORMAL HIGH (ref 11.5–15.5)
RDW: 22.8 % — ABNORMAL HIGH (ref 11.5–15.5)
WBC: 17.5 10*3/uL — AB (ref 4.0–10.5)
WBC: 33.4 10*3/uL — AB (ref 4.0–10.5)

## 2016-03-15 LAB — LACTIC ACID, PLASMA: LACTIC ACID, VENOUS: 8.9 mmol/L — AB (ref 0.5–1.9)

## 2016-03-15 LAB — MAGNESIUM: MAGNESIUM: 2.2 mg/dL (ref 1.7–2.4)

## 2016-03-15 LAB — APTT: APTT: 56 s — AB (ref 24–36)

## 2016-03-15 LAB — PROTIME-INR
INR: 2.68
PROTHROMBIN TIME: 29.1 s — AB (ref 11.4–15.2)

## 2016-03-15 LAB — CG4 I-STAT (LACTIC ACID): LACTIC ACID, VENOUS: 12.97 mmol/L — AB (ref 0.5–1.9)

## 2016-03-15 LAB — FIBRINOGEN: Fibrinogen: 231 mg/dL (ref 210–475)

## 2016-03-15 MED ORDER — FENTANYL CITRATE (PF) 100 MCG/2ML IJ SOLN
25.0000 ug | INTRAMUSCULAR | Status: DC | PRN
  Administered 2016-03-15 – 2016-03-16 (×6): 100 ug via INTRAVENOUS
  Filled 2016-03-15 (×6): qty 2

## 2016-03-15 MED ORDER — "THROMBI-PAD 3""X3"" EX PADS"
1.0000 | MEDICATED_PAD | Freq: Once | CUTANEOUS | Status: DC
  Filled 2016-03-15: qty 1

## 2016-03-15 MED ORDER — SODIUM CHLORIDE 0.9 % IV SOLN
Freq: Once | INTRAVENOUS | Status: DC
Start: 2016-03-15 — End: 2016-03-17

## 2016-03-15 MED ORDER — VITAMIN K1 10 MG/ML IJ SOLN
5.0000 mg | Freq: Once | INTRAVENOUS | Status: AC
  Administered 2016-03-15: 5 mg via INTRAVENOUS
  Filled 2016-03-15: qty 0.5

## 2016-03-15 MED ORDER — ORAL CARE MOUTH RINSE
15.0000 mL | OROMUCOSAL | Status: DC
  Administered 2016-03-15 – 2016-03-16 (×11): 15 mL via OROMUCOSAL

## 2016-03-15 MED ORDER — SODIUM CHLORIDE 0.9 % IV SOLN
Freq: Once | INTRAVENOUS | Status: AC
  Administered 2016-03-15: 11:00:00 via INTRAVENOUS

## 2016-03-15 MED ORDER — DEXTROSE 50 % IV SOLN
INTRAVENOUS | Status: AC
  Administered 2016-03-15: 25 mL
  Filled 2016-03-15: qty 50

## 2016-03-15 MED ORDER — CHLORHEXIDINE GLUCONATE 0.12% ORAL RINSE (MEDLINE KIT)
15.0000 mL | Freq: Two times a day (BID) | OROMUCOSAL | Status: DC
  Administered 2016-03-15 – 2016-03-16 (×2): 15 mL via OROMUCOSAL

## 2016-03-15 NOTE — Progress Notes (Signed)
STROKE TEAM PROGRESS NOTE   HISTORY OF PRESENT ILLNESS (per record) Robyn Porter is an 65 y.o. female who intially presented on 11/1 for management of marked volume overload in the setting of chronic systolic CHF. Her treatment this admission was confounded by sluggish diuresis despite addition of milrinone and switch to Lasix gtt. She was placed on CRRT (CVVH) after renal consult on 11/7. Total weight off as of 11/16 was 125 lbs. Today she had some right upper thigh pain, which began after she completed PT. Now on heparin gtt while getting CVVH. Her PMHx also includes stage 4 CKD, chronic atrial fibrillation (on Xarelto as outpatient), OSA, anemia, chronic venous stasis, symptomatic bradycardia and morbid obesity.   This evening the patient experienced respiratory failure and loss of consciousness. She had reported double and blurry vision prior to LOC, as well as 10/10 leg cramps. During the acute event, systolic blood pressure from cuff was in the low 60's with decreased MAP. At time of intial physician evaluation following the event (while she was intubated and unconscious), she was unresponsive to stimuli with constricted pupils that were not responsive to light. She eventually regained consciousness during her evaluation. After arterial line was placed, her initial systolic pressure reading was noted to be 104.   The most likely etiology for her LOC was felt to be syncope per primary team notes, with unclear underlying etiology - possible mechanisms documented include opiate effect. Brainstem stroke or transient brainstem hypoperfusion due to vertebrobasilar insufficiency was also on the DDx   She was sent for emergent CT which showed an equivocal hypodensity along the left occipital pole, also compatible with volume averaging artifact from a prominent sulcus. This was felt to be worrisome by the primary team given her initial symptom of blurred vision prior to the respiratory arrest.     SUBJECTIVE (INTERVAL HISTORY) Events of y`day noted .The patient is intubated. Follows simple commands. No focal deficits noted on limited exam.   OBJECTIVE Temp:  [97.1 F (36.2 C)-98.6 F (37 C)] 98.1 F (36.7 C) (11/18 0800) Pulse Rate:  [56-68] 64 (11/18 1045) Cardiac Rhythm: Ventricular paced (11/18 0800) Resp:  [13-27] 24 (11/18 1045) BP: (105-126)/(26-32) 126/30 (11/18 0750) SpO2:  [99 %-100 %] 100 % (11/18 1045) Arterial Line BP: (93-142)/(17-35) 141/29 (11/18 1045) FiO2 (%):  [40 %] 40 % (11/18 0805)  CBC:  Recent Labs Lab 03/14/16 0237  03/15/16 0030 03/15/16 0500  WBC 18.8*  < > 33.4* 23.8*  NEUTROABS 15.2*  --   --  19.5*  HGB 8.5*  < > 8.8* 7.5*  HCT 27.9*  < > 29.5* 24.6*  MCV 101.8*  < > 98.3 97.2  PLT 120*  < > 90* 62*  < > = values in this interval not displayed.  Basic Metabolic Panel:  Recent Labs Lab 03/14/16 0237  03/14/16 1600 03/14/16 1824 03/15/16 0500  NA  --   < > 133* 130* 135  135  K  --   < > 5.3* 5.3* 4.6  4.6  CL  --   < > 106 103 99*  99*  CO2  --   < > 13* 10* 14*  14*  GLUCOSE  --   < > 70 116* 90  91  BUN  --   < > 23* 24* 16  17  CREATININE  --   < > 3.38* 3.74* 2.74*  2.74*  CALCIUM  --   < > 8.4* 8.8* 8.3*  8.3*  MG 2.5*  --   --   --  2.2  PHOS  --   --  9.3*  --  7.7*  < > = values in this interval not displayed.  Lipid Panel:     Component Value Date/Time   CHOL 86 03/15/2016 0500   TRIG 76 03/15/2016 0500   HDL 28 (L) 03/15/2016 0500   CHOLHDL 3.1 03/15/2016 0500   VLDL 15 03/15/2016 0500   LDLCALC 43 03/15/2016 0500   HgbA1c:  Lab Results  Component Value Date   HGBA1C 5.1 11/29/2015   Urine Drug Screen: No results found for: LABOPIA, COCAINSCRNUR, LABBENZ, AMPHETMU, THCU, LABBARB    IMAGING  Portable Chest Xray 03/14/2016 Decreased lung volumes with increased bibasilar atelectasis versus infiltrates.   Portable Chest Xray 03/13/2016 1.  Support equipment appears satisfactorily  positioned.  2. Unchanged cardiomegaly.  No consolidation or effusion.    Ct Head Code Stroke Wo Contrast 03/13/2016 1. Question subtle blurring of the gray-white matter differentiation within the left occipital pole. While this finding may be artifactual in nature as there is some motion artifact on this exam, possible early/evolving ischemia not entirely excluded. This could be further evaluated with dedicated MRI as clinically indicated.  2. No other acute intracranial process.  3. ASPECTS is 10 Critical     PHYSICAL EXAM HEENT-  Normocephalic/atraumatic.  Lungs- Intubated Extremities- Prominent venous stasis changes distal lower extremities.    Neurological Examination Mental Status: Intubated. Drowsy to somnolent in context of sedation  l. Awakens to light noxious stimuli and will follow commands such as squeezing examiner's fingers and deviating eyes to right and left. Attempts to mouth statements but these are unintelligible.  Cranial Nerves: II: Deviates eyes to left and right in response to peripheral visual stimuli. Fixates on visual stimuli appropriately. PERRL.  III,IV, VI: Ptosis not present. Able to gaze to left and right to command. No nystagmus.  V,VII: Grimaces to command symmetrically. Responds to tactile stimulation bilaterally.  VIII: hearing intact to commands IX,X: intubated XI: Head rotates to left and right with equal strength XII: intubated Motor: Grips examiners hand with 4/5 strength bilaterally. Flexes legs backwards to command bilaterally without elevating above bed, but symmetric with no delay in initiating movement and can forcefully withdraw. Morbid obesity confounds the lower extremity exam.  Sensory: Reacts to sensory stimuli in all 4 extremities.  Deep Tendon Reflexes: Hypoactive in context of morbid obesity limiting the ability to elicit Cerebellar: Unable to assess.  Gait: Unable to assess.      ASSESSMENT/PLAN [name] is a 65  y.o. female with history of stage 4 CKD, continuous renal replacement therapy, chronic systolic CHF, chronic atrial fibrillation (on Xarelto as outpatient), OSA, anemia, hypertension, diabetes mellitus, COPD, aortic stenosis, chronic venous stasis, symptomatic bradycardia, status post permanent pacemaker, and morbid obesity presenting with respiratory failure, loss of consciousness, hypotension, visual changes, and right upper thigh pain. She did not receive IV t-PA due to Xarelto.  Possible Stroke:  Dominant infarct embolic secondary to atrial fib.  Resultant  No deficits but exam limited by sedation and intubation  MRI - PPM  MRA - PPM  Carotid Doppler - pending  LE Venous Duplex - no DVT  2D Echo - 03/02/2016 - EF 60 -65% - No cardiac source of emboli was indentified.   LDL - will order  HgbA1c - will order  VTE prophylaxis - IV heparin per pharmacy Diet NPO time specified  Xarelto (rivaroxaban) daily prior to admission, now on heparin IV  Patient counseled to be compliant with  her antithrombotic medications  Ongoing aggressive stroke risk factor management  Therapy recommendations:  pending  Disposition:  Pending  Hypertension  Systolic blood pressure running low - dopamine; levophed; midodrine  Permissive hypertension (OK if < 220/120) but gradually normalize in 5-7 days  Long-term BP goal normotensive  Hyperlipidemia  Home meds:  Lipitor 20 mg daily resumed in hospital  LDL pending, goal < 70  Continue statin at discharge  Diabetes  HgbA1c pending, goal < 7.0  Controlled  Other Stroke Risk Factors  Advanced age  Obesity, Body mass index is 44.63 kg/m., recommend weight loss, diet and exercise as appropriate   Obstructive sleep apnea  Chronic atrial fibrillation  Other Active Problems  [employer] BIV PPM 09/19/2015 - Will [name] MD  Syncope; R thigh pain; D dimer 1.89 (H) - R/O DVT / PE - lower extremity venous Dopplers  pending.  Leukocytosis - 18.8 (afebrile) currently on Maxipime and vancomycin (hold vancomycin per pharmacy)  Anemia - 8.5 / 27.9  BUN 20 ; creatinine 2.75 -> CRRT  Hyponatremia - 128  Hyperkalemia - 5.3  Thrombocytopenia - 95 -> 118 -> 120  PLAN  Repeat head [facility] day # 16    03/15/2016, 11:11 AM I have personally examined this patient, reviewed notes, independently viewed imaging studies, participated in medical decision making and plan of care.ROS completed by me personally and pertinent positives fully documented  I have made any additions or clarifications directly to the above note.  Patient's condition has unfortunately medically deteriorated due to that operative bleed and hemodynamic instability. I agree with postponing any stroke workup at the present time and her medical condition stabilizes. I had a long discussion with the patient's daughter and son at the bedside and answered questions. Stroke team will sign off at the present time. Kindly call us back when patient is medically stable to undergo further neurological testing.D/w Dr Haroldine Laws This patient is critically ill and at significant risk of neurological worsening, death and care requires constant monitoring of vital signs, hemodynamics,respiratory and cardiac monitoring, extensive review of multiple databases, frequent neurological assessment, discussion with family, other specialists and medical decision making of high complexity.I have made any additions or clarifications directly to the above note.This critical care time does not reflect procedure time, or teaching time or supervisory time of PA/NP/Med Resident etc but could involve care discussion time.  I spent 30 minutes of neurocritical care time  in the care of  this patient.     Antony Contras, MD Medical Director [facility] Pager: [phone] 03/15/2016 11:11 AM   To contact Stroke Continuity provider, please refer to  [email or web address] After hours, contact General Neurology

## 2016-03-15 NOTE — Progress Notes (Signed)
RT attempted patient on SBT. RR in lower 40's. Increased work of breathing was noted. RT placed patient on full support with previous settings. RT will continue to monitor.

## 2016-03-15 NOTE — Progress Notes (Addendum)
Patient ID: Robyn Porter, female   DOB: 09/24/1950, 65 y.o.   MRN: [id number]    Advanced Heart Failure Rounding Note   Subjective:    Admitted with marked volume overload. Sluggish diuresis despite addition of milrinone and switch to lasix gtt.   Renal consulted 03/04/16 with poor diuresis.  Pt placed on CRRT. Marked weight loss.   CT yesterday showed massive right-sided retroperitoneal hematoma. Received several units of RBCs and FFP. Remains intubated. On vasopressin and norepi. SBP 140s but DBPs in 20s so MAPs 55-60. Awake on vent. Following commands. Remains on CVVHD.  Echo: EF 60-65%, severe LVH, moderate AS with mean gradient 32 mmHg, RV not well visualized.   Objective:   Weight Range:  Vital Signs:   Temp:  [97.1 F (36.2 C)-98.6 F (37 C)] 98.1 F (36.7 C) (11/18 0800) Pulse Rate:  [56-68] 64 (11/18 0930) Resp:  [13-27] 21 (11/18 0930) BP: (105-126)/(26-32) 126/30 (11/18 0750) SpO2:  [99 %-100 %] 100 % (11/18 0930) Arterial Line BP: (93-140)/(17-35) 140/28 (11/18 0930) FiO2 (%):  [40 %] 40 % (11/18 0805) Last BM Date: 03/13/16  Weight change: Filed Weights   03/12/16 0500 03/13/16 0442 03/14/16 0253  Weight: 131.5 kg (289 lb 12.8 oz) 125.7 kg (277 lb 1.6 oz) 117.9 kg (260 lb)    Intake/Output:   Intake/Output Summary (Last 24 hours) at 03/15/16 1031 Last data filed at 03/15/16 1000  Gross per 24 hour  Intake          6149.89 ml  Output              766 ml  Net          5383.89 ml     Physical Exam: CVP 10 General:  Intubated, awake. Pale HEENT: Normal Neck: Thick. JVP not elevated. Carotids 2+ bilat; no bruits. No thyromegaly or lymphadenopathy noted.    Cor: PMI nondisplaced. Regular. 3/6 systolic crescendo-decrescendo murmur RUSB.   Lungs: Distant, diminished basilar sounds.  Abdomen: obese, soft, NT  Extremities: no cyanosis, clubbing, rash, Chronic 2-3+ edema into thighs. RIJ Temp dialysis cath.  Neuro: alert on vent.  Moves all 4 extremities  w/o difficulty.  Telemetry: Reviewed, underlying afib with V-pacing   Labs: Basic Metabolic Panel:  Recent Labs Lab 03/12/16 0414  03/13/16 0410 03/13/16 1623 03/13/16 2157 03/14/16 0225 03/14/16 0237 03/14/16 0700 03/14/16 1345 03/14/16 1600 03/14/16 1824 03/15/16 0500  NA 132*  < > 133* 132* 134* 128*  --  130* 131* 133* 130* 135  135  K 4.3  < > 4.6 4.9 4.3 5.3*  --  5.7* 6.3* 5.3* 5.3* 4.6  4.6  CL 100*  < > 100* 100* 107 100*  --  101 103 106 103 99*  99*  CO2 24  < > 27 25 18* 16*  --  16* 11* 13* 10* 14*  14*  GLUCOSE 83  < > 96 121* 126* 164*  --  122* 41* 70 116* 90  91  BUN 17  < > 17 17 15 20   --  22* 23* 23* 24* 16  17  CREATININE 1.98*  < > 2.11* 2.18* 1.97* 2.75*  --  3.15* 3.50* 3.38* 3.74* 2.74*  2.74*  CALCIUM 8.9  < > 9.3 9.5 7.8* 9.1  --  9.3 9.5 8.4* 8.8* 8.3*  8.3*  MG 2.4  --  2.4  --  2.3  --  2.5*  --   --   --   --  2.2  PHOS 3.4  < > 4.1 4.7*  --  7.0*  --   --   --  9.3*  --  7.7*  < > = values in this interval not displayed.  Liver Function Tests:  Recent Labs Lab 03/13/16 0410 03/13/16 1623 03/14/16 0225 03/14/16 1600 03/15/16 0500  ALBUMIN 2.7* 2.7* 2.6* 1.7* 2.6*   No results for input(s): LIPASE, AMYLASE in the last 168 hours. No results for input(s): AMMONIA in the last 168 hours.  CBC:  Recent Labs Lab 03/11/16 0447 03/12/16 0414 03/13/16 0410  03/14/16 0237 03/14/16 1345 03/14/16 1824 03/15/16 0030 03/15/16 0500  WBC 8.2 7.5 9.0  < > 18.8* 28.7* 34.4* 33.4* 23.8*  NEUTROABS 5.2 4.7 5.5  --  15.2*  --   --   --  19.5*  HGB 9.2* 9.2* 9.7*  < > 8.5* 7.9* 6.9* 8.8* 7.5*  HCT 30.2* 31.1* 31.9*  < > 27.9* 27.1* 23.7* 29.5* 24.6*  MCV 103.1* 102.3* 101.3*  < > 101.8* 105.0* 107.2* 98.3 97.2  PLT 103* 106* 95*  < > 120* 120* 109* 90* 62*  < > = values in this interval not displayed.  Cardiac Enzymes:  Recent Labs Lab 03/13/16 2157  TROPONINI 0.03*    BNP: BNP (last 3 results)  Recent Labs  07/09/15 1158  07/19/15 1124 03/13/2016 1754  BNP 133.0* 149.0* 429.4*    ProBNP (last 3 results) No results for input(s): PROBNP in the last 8760 hours.    Other results:  Imaging: Ct Abdomen Pelvis Wo Contrast  Addendum Date: 03/14/2016   ADDENDUM REPORT: 03/14/2016 23:04 ADDENDUM: There is a complex cystic mass in the left adnexa measuring 18 x 41 x 51 mm (AP x ML x CC), series 2, image 76 and series 5, image 53. Further evaluation with pelvic ultrasound is recommended. This recommendation follows ACR consensus guidelines: White Paper of the ACR Incidental Findings Committee II on Adnexal Findings. J Am Coll Radiol [phone]. These results will be called to the ordering clinician or representative by the Radiologist Assistant, and communication documented in the PACS or zVision Dashboard. Electronically Signed   By: Kristine Garbe M.D.   On: 03/14/2016 23:04   Result Date: 03/14/2016 CLINICAL DATA:  65 y/o F; CHF exacerbation and volume over lobe with concern for sepsis. EXAM: CT ABDOMEN AND PELVIS WITHOUT CONTRAST TECHNIQUE: Multidetector CT imaging of the abdomen and pelvis was performed following the standard protocol without IV contrast. COMPARISON:  None. FINDINGS: Lower chest: Small right pleural effusion. Dependent right lower lobe consolidation. Several calcifications within the area of consolidation as well as calcification right lobe of liver and within the spleen likely represent sequelae of granulomatous disease. There are numerous small pulmonary nodules throughout the lung bases bilaterally. Moderate cardiomegaly. Aortic valvular calcification. Coronary artery calcification. Partially visualized pacemaking leads. Enteric tube with tip in the mid stomach. Hepatobiliary: No focal liver abnormality is seen. No gallstones, gallbladder wall thickening, or biliary dilatation. Pancreas: Unremarkable. No pancreatic ductal dilatation or surrounding inflammatory changes. Spleen: Multiple  calcifications compatible with sequelae of granulomatous disease. Adrenals/Urinary Tract: Adrenal glands are unremarkable. Kidneys are normal, without renal calculi, focal lesion, or hydronephrosis. Bladder is unremarkable collapsed around Foley catheter balloon. Stomach/Bowel: Stomach is within normal limits. Appendix not identified. No evidence of bowel wall thickening, distention, or inflammatory changes. Vascular/Lymphatic: Aortic atherosclerosis. No enlarged abdominal or pelvic lymph nodes. Reproductive: Status post hysterectomy. No adnexal masses. Other: Massive right-sided retroperitoneal hematoma measuring 10.9 x 12 x 27.5 cm (AP  x ML x CC), series 2, image 52 and series 6, image 45. Left groin central venous catheter. Musculoskeletal: Multilevel degenerative changes of the spine. No acute osseous abnormality is evident. IMPRESSION: 1. Massive right-sided retroperitoneal hematoma. 2. Right lower lobe dependent consolidation may represent atelectasis or pneumonia. Small right pleural effusion. 3. No inflammatory or obstructive changes of bowel. These results will be called to the ordering clinician or representative by the Radiologist Assistant, and communication documented in the PACS or zVision Dashboard. Electronically Signed: By: Kristine Garbe M.D. On: 03/14/2016 17:32   Portable Chest Xray  Result Date: 03/14/2016 CLINICAL DATA:  Acute hypoxic respiratory failure. EXAM: PORTABLE CHEST 1 VIEW COMPARISON:  03/13/2016 FINDINGS: Endotracheal tube and right jugular central venous catheter remain in appropriate position. Pacemaker remains in place. Low lung volumes are seen. Increased opacity in both lung bases may be due to increased atelectasis or infiltrate. No evidence of pneumothorax or pleural effusion. IMPRESSION: Decreased lung volumes with increased bibasilar atelectasis versus infiltrates. Electronically Signed   By: Earle Gell M.D.   On: 03/14/2016 07:42   Portable Chest  Xray  Result Date: 03/13/2016 CLINICAL DATA:  Recent respiratory arrest EXAM: PORTABLE CHEST 1 VIEW COMPARISON:  03/04/2016 FINDINGS: Endotracheal tube is 2.2 cm above the carina. The right jugular sheath extends into the low SVC. Transvenous cardiac leads appear intact. The lungs are clear. No large effusion. No pneumothorax. Normal pulmonary vasculature. Unchanged cardiomegaly. IMPRESSION: 1.  Support equipment appears satisfactorily positioned. 2. Unchanged cardiomegaly.  No consolidation or effusion. Electronically Signed   By: Andreas Newport M.D.   On: 03/13/2016 22:22   Dg Abd Portable 1v  Result Date: 03/14/2016 CLINICAL DATA:  Encounter for OG tube EXAM: PORTABLE ABDOMEN - 1 VIEW COMPARISON:  None. FINDINGS: Enteric tube is seen with the tip in the mid stomach. Nonobstructive bowel gas pattern. IMPRESSION: Enteric tube tip in the mid stomach. Electronically Signed   By: Rolm Baptise M.D.   On: 03/14/2016 14:34   Ct Head Code Stroke Wo Contrast  Result Date: 03/13/2016 CLINICAL DATA:  Code stroke. Initial evaluation for blurry vision, double vision, respiratory arrest. EXAM: CT HEAD WITHOUT CONTRAST TECHNIQUE: Contiguous axial images were obtained from the base of the skull through the vertex without intravenous contrast. COMPARISON:  None available. FINDINGS: Brain: Cerebral volume normal. Mild chronic microvascular ischemic disease within the periventricular white matter. No acute intracranial hemorrhage. There is question of subtle loss of gray-white matter differentiation within the left occipital pole (series 2, image 12). Unclear whether this is artifactual nature versus possible early ischemic changes. Otherwise, gray-white matter differentiation maintained. Vascular: No hyperdense vessel. Skull: Scalp soft tissues within normal limits.  Calvarium intact. Sinuses/Orbits: Globes and orbits within normal limits. Small fluid level noted within the right maxilla sinus. Paranasal sinuses are  otherwise clear. No mastoid effusion. ASPECTS [facility] Stroke Program Early CT Score) - Ganglionic level infarction (caudate, lentiform nuclei, internal capsule, insula, M1-M3 cortex): 7 - Supraganglionic infarction (M4-M6 cortex): 3 Total score (0-10 with 10 being normal): 10 IMPRESSION: 1. Question subtle blurring of the gray-white matter differentiation within the left occipital pole. While this finding may be artifactual in nature as there is some motion artifact on this exam, possible early/evolving ischemia not entirely excluded. This could be further evaluated with dedicated MRI as clinically indicated. 2. No other acute intracranial process. 3. ASPECTS is 10 Critical Value/emergent results were called by telephone at the time of interpretation on 03/13/2016 at 11:35 pm to Dr. Cheral Marker ,  who verbally acknowledged these results. Electronically Signed   By: Jeannine Boga M.D.   On: 03/13/2016 23:41     Medications:     Scheduled Medications: . sodium chloride   Intravenous Once  . sodium chloride   Intravenous Once  . arformoterol  15 mcg Nebulization BID  . atorvastatin  20 mg Per Tube q1800  . budesonide (PULMICORT) nebulizer solution  0.5 mg Nebulization BID  . ceFEPime (MAXIPIME) IV  2 g Intravenous Q12H  . chlorhexidine gluconate (MEDLINE KIT)  15 mL Mouth Rinse BID  . cholecalciferol  5,000 Units Oral Daily  . darbepoetin (ARANESP) injection - NON-DIALYSIS  100 mcg Subcutaneous Q Wed-1800  . famotidine (PEPCID) IV  20 mg Intravenous Q24H  . hydrocortisone sod succinate (SOLU-CORTEF) inj  50 mg Intravenous Q6H  . ipratropium-albuterol  3 mL Inhalation Q6H  . mouth rinse  15 mL Mouth Rinse 10 times per day  . midodrine  5 mg Per Tube TID WC  . multivitamin with minerals  1 tablet Oral Daily  . sodium chloride flush  10-40 mL Intracatheter Q12H  . vancomycin  1,250 mg Intravenous Q24H  . cyanocobalamin  500 mcg Oral Daily  . vitamin C  500 mg Oral BID    Infusions: .  fentaNYL infusion INTRAVENOUS Stopped (03/14/16 1330)  . norepinephrine (LEVOPHED) Adult infusion 25 mcg/min (03/15/16 0800)  . dialysis replacement fluid (prismasate) 300 mL/hr at 03/14/16 1800  . dialysis replacement fluid (prismasate) 200 mL/hr at 03/14/16 1800  . dialysate (PRISMASATE) 2,000 mL/hr at 03/15/16 0848  .  sodium bicarbonate 150 mEq in sterile water 1000 mL infusion 150 mL/hr at 03/15/16 0800  . vasopressin (PITRESSIN) infusion - *FOR SHOCK* 0.03 Units/min (03/15/16 0800)    PRN Medications: Place/Maintain arterial line **AND** sodium chloride, acetaminophen, fentaNYL, midazolam, ondansetron (ZOFRAN) IV, sodium chloride flush   Assessment/Plan/Discussion    Robyn Porter is a 65 y.o. female with history with chronic systolic CHF, Anemia, OHS/OSA, symptomatic bradycardia s/p MDT PPm with CRT-P upgrade given high percentage of RV pacing, CKD stage IV.  Admitted with marked volume overload.  1. Hemorrhagic shock - CT 11/17 with massive RP bleed. Received 2u RBCs, 4 FFP and platelets. - HGb back down to 7.5. Will give another unit RBCs - INR still 2.68 CCM has given vitamin K this am - All anticoagulants are off - Continue norepi and vasopressin to maintain MAP >55 2. Chronic diastolic CHF: EF 99-83% on echo this admission, RV poorly visualized. Upgrade to [employer] CRT in past given constant RV pacing. Volume status markedly elevated initally. Weight up 30 lbs since August.  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  Renal function stable.  Milrinone begun for RV support.   Had to start CVVH for volume removal given diuretic failure.  Had been able to titrate off milrinone and was on just midodrine.  > 100 lb weight loss with CVVH. Course complicated by severe hypotension due to massive RP bleed. - Remains markedly volume overloaded. Continue fluid removal with CVVHD as tolerated.  - If she recovers, will end up needing RRT long-term  it looks like for volume management.  3. Symptomatic bradycardia: MDT PPM. Upgraded to CRT given high percentage RV pacing.  4. ARF on CKD Stage V:  - Remains markedly volume overloaded. Continue fluid removal with CVVHD as tolerated.  - Continue pressor support and midodrine - If she recovers, will end up needing RRT long-term it looks like for volume  management.  5. Chronic atrial fibrillation: HR not elevated.  - Off heparin due to RP bleed. 6. OHS/OSA: Once extubated Continue nightly CPAP.  7. 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 Given 1UPRBC on admit.  - has baseline anemia. Now c/b acute RP bleed. -give 1u RBCs today 8. Chronic venous stasis: Chronic changes, but stable.  9. Morbid obesity: PT following. Recommending HHPT vs SNF.   10. Neuro: Question of left occipital CVA on CT but does not have corresponding neuro findings.  Suspect event was related to hypovolemia. Neuro exam now normal. --Discussed with Sethi at bedside who does not feel that there has been acute neurological event. 11. Aortic stenosis:  Moderate by echo 03/02/16.  Muffled S2 concerning that AS could be worse.  Would consider evaluation via TEE eventually if she recovers 12. Acute respiratory failure: Intubated post-syncope/arrest. Awake on vent.  Empiric abx per CCM.  Hopefully extubated soon.  13. ID: ?Septic shock contributing to hypotension.  WBCs up but no fever.  On empiric abx. Blood cultures sent. Remain NGTD  She remains critically ill with severe baseline comorbidities. Nurse has begun DNR discussions with family. Consider DNR status.   40 minutes critical care time.   Length of Stay: 16  Glori Bickers MD 03/15/2016, 10:31 AM  Advanced Heart Failure Team Pager [phone] (M-F; [place])  Please contact [facility] for night-coverage after hours (4p -7a ) and weekends on [email or web address]

## 2016-03-15 NOTE — Progress Notes (Signed)
VASCULAR LAB PRELIMINARY  PRELIMINARY  PRELIMINARY  PRELIMINARY  Left carotid duplex completed.    Preliminary report:  1-39% ICA plaquing left ICA.  Left vertebral artery flow is antegrade.  Unable to visualize the right Carotid secondary to line.  Garion Wempe, RVT 03/15/2016, 11:13 AM

## 2016-03-15 NOTE — Progress Notes (Signed)
eLink Physician-Brief Progress Note Patient Name: Robyn Porter DOB: [birth year] MRN: [id number]   Date of Service  03/15/2016  HPI/Events of Note  Notified by bedside nurse of leaking around insertion site for arterial line. Also, the patient has a rectal temperature of 38F. Currently does not have a temperature sensing Foley. Foley catheter inserted on 11/4.   eICU Interventions  1. Bair hugger ordered 2. Thrombus had to be placed over insertion site of arterial line 3. Changing Foley catheter to temperature sensing Foley      Intervention Category Intermediate Interventions: Other:  Tera Partridge 03/15/2016, 11:28 PM

## 2016-03-15 NOTE — Progress Notes (Signed)
PULMONARY / CRITICAL CARE MEDICINE   Name: Robyn Porter MRN: [id number] DOB: Oct 28, 1950    ADMISSION DATE:  03/07/2016 CONSULTATION DATE: 03/15/16  REFERRING MD:  Haroldine Laws, D  CHIEF COMPLAINT:  Respiratory arrest  HISTORY OF PRESENT ILLNESS:   Robyn Porter is a 65 y.o. female chronic dCHF, AS, chronic afib on Xarelto (at home), chronic anemia due to renal failure, Hx of GI bleeds, and OHS/OSA who was admitted with marked fluid overload two weeks ago. She failed diuresis with lasix gtt and started on CRRT by nephrology starting 03/04/2016. Weight down by 113 pounds over this admission. Most recent vital sign was significant for 80/44. Bmet was significant for HypoNa to 132 and sCr of 2.18. Last CXR was on 11/07 and significant for pulm edema. She hasn't had EKG or troponin this admission. She has received 2 tablets of Vicodin 5/325 mg 11/16.  11/16 pt had leg cramps and CVVHD stopped, developed diplopia and then progressive AMS. Evolved to resp arrest requiring MV. Initially was called as a code stroke  CULTURES: None  ANTIBIOTICS: None  SIGNIFICANT EVENTS: 11/01: admitted due to marked fluid overload 11/07: CRRT started 11/16: respiratory arrest  LINES/TUBES: 03/04/2016: HD cath in Rt IJ 03/01/2016: Foley cath 03/13/2016: PIV 03/03/2016: A-line   EVETNS 11/17 Intubated Hypotensive but ? Accurate pressure Wakes to voice Head Ct > ? L occipital artifact vs early ischemia    SUBJECTIVE/OVERNIGHT/INTERVAL HX 11/18 =- remains intubated. CT abd yesterday due to abd pain and rising lactate with large RP hematoma. On 40% fio2. Off heparin gtt. Not on sedation gtt -> wua - > follows commands. On 25 levophed and shock dose vasopressin. RN reports that patient is alert when MAP> 55 but otherwise gets less responsive. On CRRT.. On bicarb gtt. Running even.Neices at bedside and nephew - questions on prognosis  VITAL SIGNS: BP (!) 142/30   Pulse 68   Temp 97.8 F (36.6 C)  (Axillary)   Resp (!) 24   Ht 5\' 4"  (1.626 m)   Wt 117.9 kg (260 lb)   SpO2 100%   BMI 44.63 kg/m   HEMODYNAMICS: CVP:  [2 mmHg-13 mmHg] 13 mmHg  VENTILATOR SETTINGS: Vent Mode: PRVC FiO2 (%):  [40 %] 40 % Set Rate:  [14 bmp] 14 bmp Vt Set:  [420 mL] 420 mL PEEP:  [5 cmH20] 5 cmH20 Plateau Pressure:  [10 cmH20-18 cmH20] 14 cmH20  INTAKE / OUTPUT: I/O last 3 completed shifts: In: 7154.3 [I.V.:4236.3; Blood:2188; NG/GT:30; IV Piggyback:700] Out: 883 [Emesis/NG output:100; Other:783]  PHYSICAL EXAMINATION: General:  Obese, lying in bed Neuro:Sedated and intubated. Opens eyes, follows commands.  HEENT:  Normocephalic, ETT in place, pupils equal, ~3 mm, minimally reactive to light Cardiovascular: RRR, 3/6 SEM over R and LUSB Lungs: intubated, air movement bilaterally, no wheeze or crackles Abdomen: obese, soft, limited exam Musculoskeletal: lipidemia  Skin: dry scaly skin in LE Back -bed sore per RN  LABS:   PULMONARY  Recent Labs Lab 03/13/16 2327  03/14/16 1349 03/14/16 1520 03/14/16 1820 03/15/16 0103 03/15/16 0333 03/15/16 0335  PHART 7.303*  --  7.133*  --  7.107* 7.161*  --  7.183*  PCO2ART 41.6  --  29.7*  --  33.7 32.4  --  33.9  PO2ART 320.0*  --  145.0*  --  109* 102.0  --  116*  HCO3 20.7  --  10.0*  --  10.2* 11.6*  --  12.4*  TCO2 22  --  11  --   --  13  --   --   O2SAT 100.0  < > 99.0 58.1 98.7 96.0 78.8 97.8  < > = values in this interval not displayed.  CBC  Recent Labs Lab 03/14/16 1824 03/15/16 0030 03/15/16 0500  HGB 6.9* 8.8* 7.5*  HCT 23.7* 29.5* 24.6*  WBC 34.4* 33.4* 23.8*  PLT 109* 90* 62*    COAGULATION  Recent Labs Lab 03/14/16 2030 03/15/16 0843  INR 3.23 2.68    CARDIAC   Recent Labs Lab 03/13/16 2157  TROPONINI 0.03*   No results for input(s): PROBNP in the last 168 hours.   CHEMISTRY  Recent Labs Lab 03/12/16 0414  03/13/16 0410 03/13/16 1623 03/13/16 2157 03/14/16 0225 03/14/16 0237  03/14/16 0700 03/14/16 1345 03/14/16 1600 03/14/16 1824 03/15/16 0500  NA 132*  < > 133* 132* 134* 128*  --  130* 131* 133* 130* 135  135  K 4.3  < > 4.6 4.9 4.3 5.3*  --  5.7* 6.3* 5.3* 5.3* 4.6  4.6  CL 100*  < > 100* 100* 107 100*  --  101 103 106 103 99*  99*  CO2 24  < > 27 25 18* 16*  --  16* 11* 13* 10* 14*  14*  GLUCOSE 83  < > 96 121* 126* 164*  --  122* 41* 70 116* 90  91  BUN 17  < > 17 17 15 20   --  22* 23* 23* 24* 16  17  CREATININE 1.98*  < > 2.11* 2.18* 1.97* 2.75*  --  3.15* 3.50* 3.38* 3.74* 2.74*  2.74*  CALCIUM 8.9  < > 9.3 9.5 7.8* 9.1  --  9.3 9.5 8.4* 8.8* 8.3*  8.3*  MG 2.4  --  2.4  --  2.3  --  2.5*  --   --   --   --  2.2  PHOS 3.4  < > 4.1 4.7*  --  7.0*  --   --   --  9.3*  --  7.7*  < > = values in this interval not displayed. Estimated Creatinine Clearance: 25.5 mL/min (by C-G formula based on SCr of 2.74 mg/dL (H)).   LIVER  Recent Labs Lab 03/13/16 0410 03/13/16 1623 03/14/16 0225 03/14/16 1600 03/14/16 2030 03/15/16 0500 03/15/16 0843  ALBUMIN 2.7* 2.7* 2.6* 1.7*  --  2.6*  --   INR  --   --   --   --  3.23  --  2.68     INFECTIOUS  Recent Labs Lab 03/14/16 1227 03/14/16 1343 03/15/16 0035  LATICACIDVEN 7.8* 10.11* 12.97*     ENDOCRINE CBG (last 3)   Recent Labs  03/15/16 0035 03/15/16 0423 03/15/16 0814  GLUCAP 101* 90 79         IMAGING x48h  - image(s) personally visualized  -   highlighted in bold Ct Abdomen Pelvis Wo Contrast  Addendum Date: 03/14/2016   ADDENDUM REPORT: 03/14/2016 23:04 ADDENDUM: There is a complex cystic mass in the left adnexa measuring 18 x 41 x 51 mm (AP x ML x CC), series 2, image 76 and series 5, image 53. Further evaluation with pelvic ultrasound is recommended. This recommendation follows ACR consensus guidelines: White Paper of the ACR Incidental Findings Committee II on Adnexal Findings. J Am Coll Radiol [phone]. These results will be called to the ordering clinician or  representative by the Radiologist Assistant, and communication documented in the PACS or zVision Dashboard. Electronically Signed   By: Mia Creek  [place]Stratton M.D.   On: 03/14/2016 23:04   Result Date: 03/14/2016 CLINICAL DATA:  65 y/o F; CHF exacerbation and volume over lobe with concern for sepsis. EXAM: CT ABDOMEN AND PELVIS WITHOUT CONTRAST TECHNIQUE: Multidetector CT imaging of the abdomen and pelvis was performed following the standard protocol without IV contrast. COMPARISON:  None. FINDINGS: Lower chest: Small right pleural effusion. Dependent right lower lobe consolidation. Several calcifications within the area of consolidation as well as calcification right lobe of liver and within the spleen likely represent sequelae of granulomatous disease. There are numerous small pulmonary nodules throughout the lung bases bilaterally. Moderate cardiomegaly. Aortic valvular calcification. Coronary artery calcification. Partially visualized pacemaking leads. Enteric tube with tip in the mid stomach. Hepatobiliary: No focal liver abnormality is seen. No gallstones, gallbladder wall thickening, or biliary dilatation. Pancreas: Unremarkable. No pancreatic ductal dilatation or surrounding inflammatory changes. Spleen: Multiple calcifications compatible with sequelae of granulomatous disease. Adrenals/Urinary Tract: Adrenal glands are unremarkable. Kidneys are normal, without renal calculi, focal lesion, or hydronephrosis. Bladder is unremarkable collapsed around Foley catheter balloon. Stomach/Bowel: Stomach is within normal limits. Appendix not identified. No evidence of bowel wall thickening, distention, or inflammatory changes. Vascular/Lymphatic: Aortic atherosclerosis. No enlarged abdominal or pelvic lymph nodes. Reproductive: Status post hysterectomy. No adnexal masses. Other: Massive right-sided retroperitoneal hematoma measuring 10.9 x 12 x 27.5 cm (AP x ML x CC), series 2, image 52 and series 6, image 45.  Left groin central venous catheter. Musculoskeletal: Multilevel degenerative changes of the spine. No acute osseous abnormality is evident. IMPRESSION: 1. Massive right-sided retroperitoneal hematoma. 2. Right lower lobe dependent consolidation may represent atelectasis or pneumonia. Small right pleural effusion. 3. No inflammatory or obstructive changes of bowel. These results will be called to the ordering clinician or representative by the Radiologist Assistant, and communication documented in the PACS or zVision Dashboard. Electronically Signed: By: Kristine Garbe M.D. On: 03/14/2016 17:32   Portable Chest Xray  Result Date: 03/14/2016 CLINICAL DATA:  Acute hypoxic respiratory failure. EXAM: PORTABLE CHEST 1 VIEW COMPARISON:  03/13/2016 FINDINGS: Endotracheal tube and right jugular central venous catheter remain in appropriate position. Pacemaker remains in place. Low lung volumes are seen. Increased opacity in both lung bases may be due to increased atelectasis or infiltrate. No evidence of pneumothorax or pleural effusion. IMPRESSION: Decreased lung volumes with increased bibasilar atelectasis versus infiltrates. Electronically Signed   By: Earle Gell M.D.   On: 03/14/2016 07:42   Portable Chest Xray  Result Date: 03/13/2016 CLINICAL DATA:  Recent respiratory arrest EXAM: PORTABLE CHEST 1 VIEW COMPARISON:  03/04/2016 FINDINGS: Endotracheal tube is 2.2 cm above the carina. The right jugular sheath extends into the low SVC. Transvenous cardiac leads appear intact. The lungs are clear. No large effusion. No pneumothorax. Normal pulmonary vasculature. Unchanged cardiomegaly. IMPRESSION: 1.  Support equipment appears satisfactorily positioned. 2. Unchanged cardiomegaly.  No consolidation or effusion. Electronically Signed   By: Andreas Newport M.D.   On: 03/13/2016 22:22   Dg Abd Portable 1v  Result Date: 03/14/2016 CLINICAL DATA:  Encounter for OG tube EXAM: PORTABLE ABDOMEN - 1 VIEW  COMPARISON:  None. FINDINGS: Enteric tube is seen with the tip in the mid stomach. Nonobstructive bowel gas pattern. IMPRESSION: Enteric tube tip in the mid stomach. Electronically Signed   By: Rolm Baptise M.D.   On: 03/14/2016 14:34   Ct Head Code Stroke Wo Contrast  Result Date: 03/13/2016 CLINICAL DATA:  Code stroke. Initial evaluation for blurry vision, double vision, respiratory arrest. EXAM: CT  HEAD WITHOUT CONTRAST TECHNIQUE: Contiguous axial images were obtained from the base of the skull through the vertex without intravenous contrast. COMPARISON:  None available. FINDINGS: Brain: Cerebral volume normal. Mild chronic microvascular ischemic disease within the periventricular white matter. No acute intracranial hemorrhage. There is question of subtle loss of gray-white matter differentiation within the left occipital pole (series 2, image 12). Unclear whether this is artifactual nature versus possible early ischemic changes. Otherwise, gray-white matter differentiation maintained. Vascular: No hyperdense vessel. Skull: Scalp soft tissues within normal limits.  Calvarium intact. Sinuses/Orbits: Globes and orbits within normal limits. Small fluid level noted within the right maxilla sinus. Paranasal sinuses are otherwise clear. No mastoid effusion. ASPECTS [facility] Stroke Program Early CT Score) - Ganglionic level infarction (caudate, lentiform nuclei, internal capsule, insula, M1-M3 cortex): 7 - Supraganglionic infarction (M4-M6 cortex): 3 Total score (0-10 with 10 being normal): 10 IMPRESSION: 1. Question subtle blurring of the gray-white matter differentiation within the left occipital pole. While this finding may be artifactual in nature as there is some motion artifact on this exam, possible early/evolving ischemia not entirely excluded. This could be further evaluated with dedicated MRI as clinically indicated. 2. No other acute intracranial process. 3. ASPECTS is 10 Critical Value/emergent results  were called by telephone at the time of interpretation on 03/13/2016 at 11:35 pm to Dr. Cheral Marker , who verbally acknowledged these results. Electronically Signed   By: Jeannine Boga M.D.   On: 03/13/2016 23:41     DISCUSSION: Laddie Lamere is a 65 y.o. female chronic dCHF, AS, chronic afib on Xarelto (at home, not on anticoag here except with CRRT), CKD-4, Hx of GI bleeds, and OHS/OSA who was admitted with marked fluid overload requiring CRRT. Patient had hypotension, progressive lethargy, developed progressive dyspnea and respiratory arrest. Differential diagnosis are CVA (hx of Afib but PTT>200), Opiate (on high dose norco here), ACS, Pneumonia  ASSESSMENT / PLAN:  PULMONARY A: VDRF - following syncope with subsequent respiratory arrest.  Unclear etiology at this point but suspect multifactorial hypotension due to diuresis, narcs, BP meds, etc.  Doubt PE given chronic anticoagulation for A.fib (xarelto) Pulmonary edema. 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    - does not meet extubation  Criteria 03/15/2016 P:   PRVC 8cc/kg VAP prevention measures. DuoNebs. Budesonide / Brovana in lieu of preadmission symbicort. Can try SBT 03/15/2016 but no extubation  NEUROLOGIC A:   Syncope - unclear etiology at this point. ?opiate Acute encephalopathy - due to chemical sedation.   - followed commands. perRN MAP needs to be> 55 to maintain mental status P:   Sedation:  Fentanyl gtt -> chagne to prn / Midazolam PRN. RASS goal: 0 to -1. Daily WUA. Limit sedating meds. Appreciate Neurology eval. Would benefit from MRI brain but unsure she can get this with her BiV pacer in place.   CARDIOVASCULAR A:  Hypotension - concern cardiogenic shock. Hx dCHF (Echo from 11/5 with EF 60-65%, severe LVH), A.fib (on chronic xarelto), HTN, AS, bradycardia s/p PPM.   - ongoing circulator shock  P:  Restart levophed, goal MAP >  65. Continue midodrine. Avoid anticoagulation due to RP bleed 03/14/16 Continue preadmission atorvastatin. Hold preadmission metolazone, toprol-xl, torsemide  RENAL A:   CKD - started on CRRT and will likely progress to needing full HD.   -on CRRT  P:   Per renal  GASTROINTESTINAL A:   GI prophylaxis. Nutrition. Obesity.  P: SUP: Pantoprazole. NPO. Nutrition consult prior to d/c for weight loss education, etc.  HEMATOLOGIC A:   Anemia. On chronic anticoagulation for A.fib (xarelto).  New massive RP bleed11/17/17 - ?  xareltoand renal insuff as risk factors   P:  Transfuse for Hgb < 7. Monitor platelet counts. Avoid heparin gtt ordered Follow CBC  INFECTIOUS A:   No clear evidence infection Possible sepsis  P:   Anti-infectives    Start     Dose/Rate Route Frequency Ordered Stop   03/15/16 1800  vancomycin (VANCOCIN) 1,250 mg in sodium chloride 0.9 % 250 mL IVPB     1,250 mg 166.7 mL/hr over 90 Minutes Intravenous Every 24 hours 03/14/16 1902     03/15/16 0600  ceFEPIme (MAXIPIME) 2 g in dextrose 5 % 50 mL IVPB     2 g 100 mL/hr over 30 Minutes Intravenous Every 12 hours 03/14/16 1902     03/15/16 0200  vancomycin (VANCOCIN) 1,250 mg in sodium chloride 0.9 % 250 mL IVPB  Status:  Discontinued     1,250 mg 166.7 mL/hr over 90 Minutes Intravenous Every 24 hours 03/13/16 2341 03/14/16 0147   03/15/16 0000  ceFEPIme (MAXIPIME) 2 g in dextrose 5 % 50 mL IVPB  Status:  Discontinued     2 g 100 mL/hr over 30 Minutes Intravenous Every 24 hours 03/14/16 0147 03/14/16 1125   03/15/16 0000  ceFEPIme (MAXIPIME) 500 mg in dextrose 5 % 50 mL IVPB  Status:  Discontinued     500 mg 100 mL/hr over 30 Minutes Intravenous Every 24 hours 03/14/16 1125 03/14/16 2132   03/14/16 0000  ceFEPIme (MAXIPIME) 2 g in dextrose 5 % 50 mL IVPB  Status:  Discontinued     2 g 100 mL/hr over 30 Minutes Intravenous Every 12 hours 03/13/16 2341 03/14/16 0147   03/13/16 2345  vancomycin  (VANCOCIN) 2,500 mg in sodium chloride 0.9 % 500 mL IVPB     2,500 mg 250 mL/hr over 120 Minutes Intravenous  Once 03/13/16 2341 03/14/16 0259        ENDOCRINE A:   No acute issues. P:   No interventions required.  Family updated: Multiple family members updated at bedside.03/15/2016 -per RN they are contemplating partial code but they did not ask me this question  Interdisciplinary Family Meeting v Palliative Care Meeting:  Due by: 03/21/16.    The patient is critically ill with multiple organ systems failure and requires high complexity decision making for assessment and support, frequent evaluation and titration of therapies, application of advanced monitoring technologies and extensive interpretation of multiple databases.   Critical Care Time devoted to patient care services described in this note is  30  Minutes. This time reflects time of care of this signee Dr Brand Males. This critical care time does not reflect procedure time, or teaching time or supervisory time of PA/NP/Med student/Med Resident etc but could involve care discussion time    Dr. Brand Males, M.D., [facility].C.P Pulmonary and Critical Care Medicine Staff Physician [facility] Pager: [phone], If no answer or between  15:00h - 7:00h: call [phone]  03/15/2016 12:17 PM

## 2016-03-15 NOTE — Progress Notes (Signed)
[place] Progress Note Patient Name: Camyia Ertman DOB: [birth year] MRN: [id number]   Date of Service  03/15/2016  HPI/Events of Note  Patient with large retroperitoneal bleed. Previously on heparin drip. Heparin drip now off. Updated by bedside nurse regarding continued shock requiring Levophed and vasopressin infusions. Review of labs shows INR 3.23 & PTT 86. Status post 2 units packed red blood cells. Nurse drawing CBC & ABG at this time.   eICU Interventions  1. Await results of CBC and ABG 2. Stat transfusion 4 units FFP 3. INR & PTT posttransfusion 4. Continuing to hold systemic anticoagulation      Intervention Category Major Interventions: Shock - evaluation and management;Hemorrhage - evaluation and management  Tera Partridge 03/15/2016, 12:32 AM

## 2016-03-15 NOTE — Procedures (Signed)
Patient seen and examined on CRRT.  Qb 150, UF goal: none.   Will increase effluent dose to attempt to correct acidosis.  [name] [facility] Cell [phone] pgr [phone] 9:35 AM

## 2016-03-15 NOTE — Progress Notes (Signed)
[facility] Rounding Note  Subjective/Interval History:   CT abd/ pelvis with large RP bleed.  All heparin stopped.    Requiring blood products.  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    On large doses of pressors.  ABX for potential sepsis.  I have spoken to her niece Joaquim Lai and let her know how dire the situation is.  Objective Vital signs in last 24 hours: Vitals:   03/15/16 0800 03/15/16 0815 03/15/16 0830 03/15/16 0845  BP:      Pulse: 62 63 63 64  Resp: 18 19 16 17   Temp: 98.1 F (36.7 C)     TempSrc: Axillary     SpO2: 100% 100% 100% 100%  Weight:      Height:       Weight change:   Intake/Output Summary (Last 24 hours) at 03/15/16 0924 Last data filed at 03/15/16 0900  Gross per 24 hour  Intake          5956.48 ml  Output              716 ml  Net          5240.48 ml    HEMODYNAMICS: CVP:  [13 mmHg-17 mmHg] 13 mmHg  Physical Exam: Blood pressure (!) 110/46, pulse 60, temperature 97.8 F (36.6 C), temperature source Oral, resp. rate 16, height 5' 4"  (1.626 m), weight 289 lb 12.8 oz (131.5 kg), SpO2 92 %. GEN: intubated, sedated, doesn't follow commands HEENT: PERRL, ETT in place NECK: R dialysis catheter in place PULM: coarse CV tachycardic, III/VI systolic murmur, more prominent than prior ABD: obese, no flank bruising from what I can tell EXT: + venous stasis, anasarca, worsened NEURO: withdraws to pain, appears to have some myoclonic jerking   Recent Labs Lab 03/12/16 0414 03/12/16 1615 03/13/16 0410 03/13/16 1623 03/13/16 2157 03/14/16 0225 03/14/16 0700 03/14/16 1345 03/14/16 1600 03/14/16 1824 03/15/16 0500  NA 132* 130* 133* 132* 134* 128* 130* 131* 133* 130* 135  135  K 4.3 4.3 4.6 4.9 4.3 5.3* 5.7* 6.3* 5.3* 5.3* 4.6  4.6  CL 100* 98* 100* 100* 107 100* 101 103 106 103 99*  99*  CO2 24 24 27 25  18* 16* 16* 11* 13* 10* 14*  14*  GLUCOSE 83 101* 96 121* 126* 164* 122* 41* 70 116* 90  91  BUN 17 14 17 17 15 20  22* 23* 23* 24* 16  17   CREATININE 1.98* 2.14* 2.11* 2.18* 1.97* 2.75* 3.15* 3.50* 3.38* 3.74* 2.74*  2.74*  CALCIUM 8.9 9.0 9.3 9.5 7.8* 9.1 9.3 9.5 8.4* 8.8* 8.3*  8.3*  PHOS 3.4 3.3 4.1 4.7*  --  7.0*  --   --  9.3*  --  7.7*     Recent Labs Lab 03/14/16 0225 03/14/16 1600 03/15/16 0500  ALBUMIN 2.6* 1.7* 2.6*     Recent Labs Lab 03/12/16 0414 03/13/16 0410  03/14/16 0237 03/14/16 1345 03/14/16 1824 03/15/16 0030 03/15/16 0500  WBC 7.5 9.0  < > 18.8* 28.7* 34.4* 33.4* 23.8*  NEUTROABS 4.7 5.5  --  15.2*  --   --   --  19.5*  HGB 9.2* 9.7*  < > 8.5* 7.9* 6.9* 8.8* 7.5*  HCT 31.1* 31.9*  < > 27.9* 27.1* 23.7* 29.5* 24.6*  MCV 102.3* 101.3*  < > 101.8* 105.0* 107.2* 98.3 97.2  PLT 106* 95*  < > 120* 120* 109* 90* 62*  < > = values in this interval not displayed. Results for [name] (  MRN [id number]) as of 03/06/2016 07:28  Ref. Range 03/04/2016 14:10  PTH Latest Ref Range: 15 - 65 pg/mL 50    Medications: . fentaNYL infusion INTRAVENOUS Stopped (03/14/16 1330)  . norepinephrine (LEVOPHED) Adult infusion 25 mcg/min (03/15/16 0800)  . dialysis replacement fluid (prismasate) 300 mL/hr at 03/14/16 1800  . dialysis replacement fluid (prismasate) 200 mL/hr at 03/14/16 1800  . dialysate (PRISMASATE) 2,000 mL/hr at 03/15/16 0848  .  sodium bicarbonate 150 mEq in sterile water 1000 mL infusion 150 mL/hr at 03/15/16 0800  . vasopressin (PITRESSIN) infusion - *FOR SHOCK* 0.03 Units/min (03/15/16 0800)   . sodium chloride   Intravenous Once  . sodium chloride   Intravenous Once  . arformoterol  15 mcg Nebulization BID  . atorvastatin  20 mg Per Tube q1800  . budesonide (PULMICORT) nebulizer solution  0.5 mg Nebulization BID  . ceFEPime (MAXIPIME) IV  2 g Intravenous Q12H  . chlorhexidine gluconate (MEDLINE KIT)  15 mL Mouth Rinse BID  . cholecalciferol  5,000 Units Oral Daily  . darbepoetin (ARANESP) injection - NON-DIALYSIS  100 mcg Subcutaneous Q Wed-1800  . famotidine (PEPCID) IV  20 mg  Intravenous Q24H  . hydrocortisone sod succinate (SOLU-CORTEF) inj  50 mg Intravenous Q6H  . ipratropium-albuterol  3 mL Inhalation Q6H  . mouth rinse  15 mL Mouth Rinse QID  . midodrine  5 mg Per Tube TID WC  . multivitamin with minerals  1 tablet Oral Daily  . sodium chloride flush  10-40 mL Intracatheter Q12H  . vancomycin  1,250 mg Intravenous Q24H  . cyanocobalamin  500 mcg Oral Daily  . vitamin C  500 mg Oral BID     Background: 65 y.o. 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 Hollie Salk; Baseline worsening over course of 2017),  super morbid obesity, OHS/OSA (CPAP), dCHF, AS, pacemaker, AF. Admitted at least 3X 2017 with massive volume overload that responded to diuretics on those occasions (05/2015, 08/2015, 11/2015)  Admitted 11/1 with 30 lb weight increase since August, SOB, worsening edema. Failed high dose lasix and metolazone + milrinone w/inability to achieve neg fluid balance.Creatinine had been 2.4-2.7 during the admission.  Renal consulted for CRRT.  Pt  acknowledged/accepted that she could end up on dialysis permanently. Catheter placed and CRRT started 11/7.   Assessment/Recommendations   1.  Diuretic refractory CHF/volume overload - CRRT started late 11/7 - goal 300 off.Has RP bleed now, no heparin either in circuit or systemically.  Attempting to correct metabolic acidosis with CRRT.   2.  Shock: hemorrhagic.  hgb nadired at 6.9.  Getting blood, complicated since she has antibodies.  Has had 4 u pRBCs so far.  On norepi and vaso.  Septic shock may be contributing (possible PNA on CT scan) so empiric antibiotics started (11/16-).  Serial CBCs and coag profiles.  3.  Coagulopathy: looks like DIC.  Continue supportive care--> FFP, cryo, etc.  4.  Metabolic anion gap acidosis: 2/2 elevated lactate.  CRRT as above.  Also on bicarb gtt.  5.  Acute respiratory failure: AMS/ hypoxia.  Intubated.  Per CCM  6.  CKD4 -Baseline creatinine has been worsening over the course of 2017 -  most recently  2.4-2.6.  Patient would be interested in [place] as HD site.  No renal recovery, she is essentially ESRD.  7.  Acute blood loss anemia/ massive RP bleed: transfuse prn.  For anemia of chronic disease had started aranesp 100 q week (11/8-)   Chronic dCHF. EF  60-65%. Suspicion primary RV failure. ECHO this admission "nl RV fx" but RV not well visualized. Milrinone d/c'ed 11/13.  AS - "moderate" on TTE. Dr. Aundra Dubin indicates that TEE best way to assess.  Pacemaker status AFib - no anticoagulation Metabolic bone - PTH 50, no intervention needed. Ca and phos OK OHS/OSA - CPAP at night. Has own machine.  Intubated now Super morbid obesity.  Dispo: unclear as of yet. Critically ill.  [name] [facility] Cell [phone] pgr [phone] 03/15/2016, 9:24 AM

## 2016-03-15 NOTE — Progress Notes (Signed)
VASCULAR LAB PRELIMINARY  PRELIMINARY  PRELIMINARY  PRELIMINARY  Lower extremity venous duplex completed.    Preliminary report:  There is no obvious evidence of DVT or SVT noted in the visualized veins of the bilateral lower extremities.   Tilman Mcclaren, RVT 03/15/2016, 10:58 AM

## 2016-03-15 NOTE — Progress Notes (Addendum)
Pt. Rectal temp was 94.0 degrees farenheit, elink called and notified.  Bair hugger will be applied and foley temp probe will be inserted will be used to monitor temp more closely.  A-line is leaking, thrombi pad to be applied.  Will continue to monitor closely.

## 2016-03-15 NOTE — Significant Event (Signed)
03/15/16 5:49 PM  Notified by nurse that MAPs are improving. Plan to DC Levophed, followed by possible discontinuation of Vasopressin. Lactic acid is 8.9 but downtrending.  Soyla Murphy, MD Cardiology

## 2016-03-15 NOTE — Progress Notes (Signed)
[facility] Progress Note Patient Name: Kimori Maves DOB: [birth year] MRN: [id number]   Date of Service  03/15/2016  HPI/Events of Note  Lactic Acid = 8.9. BP = 144/29 with MAP = 56. (Goal MAP = 55), CVP = 12-13. HGB = 8.3 and she has subsequently been transfused 1 unit PRBC.   eICU Interventions  Will order: 1. COOX now.      Intervention Category Major Interventions: Acid-Base disturbance - evaluation and management  Sommer,Steven Eugene 03/15/2016, 5:56 PM

## 2016-03-15 NOTE — Progress Notes (Signed)
CRITICAL VALUE ALERT  Critical value received:  Lactic acid 8.9 Date of notification:  03/15/2016  Time of notification:  [id number]  Critical value read back:Yes.    Nurse who received alert:  Carleene Cooper   MD notified (1st page):  Dr. Corinna Lines  Time of first page:  [phone]  MD notified (2nd page):  Time of second page:  Responding MD:  Dr. Corinna Lines  Time MD responded:  [phone]

## 2016-03-15 NOTE — Progress Notes (Signed)
Cards fellow notified of lactic acid 8.9.  Also clarified to continue weaning levophed instead of stopping vasopressin.  To continue weaning levophed.  Will continue to monitor pt closely.

## 2016-03-16 DIAGNOSIS — Z66 Do not resuscitate: Secondary | ICD-10-CM

## 2016-03-16 DIAGNOSIS — R58 Hemorrhage, not elsewhere classified: Secondary | ICD-10-CM

## 2016-03-16 LAB — PROTIME-INR
INR: 2.74
Prothrombin Time: 29.6 seconds — ABNORMAL HIGH (ref 11.4–15.2)

## 2016-03-16 LAB — CBC WITH DIFFERENTIAL/PLATELET
BASOS ABS: 0 10*3/uL (ref 0.0–0.1)
Basophils Relative: 0 %
Eosinophils Absolute: 0 10*3/uL (ref 0.0–0.7)
Eosinophils Relative: 0 %
HEMATOCRIT: 24.1 % — AB (ref 36.0–46.0)
HEMOGLOBIN: 7.8 g/dL — AB (ref 12.0–15.0)
LYMPHS ABS: 1.2 10*3/uL (ref 0.7–4.0)
Lymphocytes Relative: 7 %
MCH: 29.9 pg (ref 26.0–34.0)
MCHC: 32.4 g/dL (ref 30.0–36.0)
MCV: 92.3 fL (ref 78.0–100.0)
MONO ABS: 0.9 10*3/uL (ref 0.1–1.0)
MONOS PCT: 5 %
NEUTROS ABS: 15.5 10*3/uL — AB (ref 1.7–7.7)
Neutrophils Relative %: 88 %
Platelets: 50 10*3/uL — ABNORMAL LOW (ref 150–400)
RBC: 2.61 MIL/uL — ABNORMAL LOW (ref 3.87–5.11)
RDW: 23.8 % — ABNORMAL HIGH (ref 11.5–15.5)
WBC Morphology: INCREASED
WBC: 17.6 10*3/uL — ABNORMAL HIGH (ref 4.0–10.5)

## 2016-03-16 LAB — PREPARE FRESH FROZEN PLASMA
UNIT DIVISION: 0
UNIT DIVISION: 0
Unit division: 0
Unit division: 0

## 2016-03-16 LAB — COOXEMETRY PANEL
Carboxyhemoglobin: 1.4 % (ref 0.5–1.5)
Methemoglobin: 1.3 % (ref 0.0–1.5)
O2 Saturation: 70 %
TOTAL HEMOGLOBIN: 7.6 g/dL — AB (ref 12.0–16.0)

## 2016-03-16 LAB — RENAL FUNCTION PANEL
ALBUMIN: 2.1 g/dL — AB (ref 3.5–5.0)
ANION GAP: 15 (ref 5–15)
BUN: 11 mg/dL (ref 6–20)
CALCIUM: 7.5 mg/dL — AB (ref 8.9–10.3)
CO2: 19 mmol/L — ABNORMAL LOW (ref 22–32)
Chloride: 98 mmol/L — ABNORMAL LOW (ref 101–111)
Creatinine, Ser: 1.77 mg/dL — ABNORMAL HIGH (ref 0.44–1.00)
GFR calc non Af Amer: 29 mL/min — ABNORMAL LOW (ref >60)
GFR, EST AFRICAN AMERICAN: 33 mL/min — AB (ref >60)
Glucose, Bld: 119 mg/dL — ABNORMAL HIGH (ref 65–99)
PHOSPHORUS: 4.4 mg/dL (ref 2.5–4.6)
Potassium: 4.6 mmol/L (ref 3.5–5.1)
SODIUM: 132 mmol/L — AB (ref 135–145)

## 2016-03-16 LAB — GLUCOSE, CAPILLARY
GLUCOSE-CAPILLARY: 42 mg/dL — AB (ref 65–99)
GLUCOSE-CAPILLARY: 104 mg/dL — AB (ref 65–99)
GLUCOSE-CAPILLARY: 46 mg/dL — AB (ref 65–99)
GLUCOSE-CAPILLARY: 57 mg/dL — AB (ref 65–99)
Glucose-Capillary: 124 mg/dL — ABNORMAL HIGH (ref 65–99)
Glucose-Capillary: 27 mg/dL — CL (ref 65–99)
Glucose-Capillary: 88 mg/dL (ref 65–99)
Glucose-Capillary: 88 mg/dL (ref 65–99)

## 2016-03-16 LAB — LACTIC ACID, PLASMA: LACTIC ACID, VENOUS: 8.2 mmol/L — AB (ref 0.5–1.9)

## 2016-03-16 LAB — MAGNESIUM: Magnesium: 2.2 mg/dL (ref 1.7–2.4)

## 2016-03-16 LAB — PREPARE RBC (CROSSMATCH)

## 2016-03-16 MED ORDER — DEXTROSE 50 % IV SOLN
INTRAVENOUS | Status: AC
  Administered 2016-03-16: 50 mL
  Filled 2016-03-16: qty 100

## 2016-03-16 MED ORDER — DEXTROSE 50 % IV SOLN
1.0000 | Freq: Once | INTRAVENOUS | Status: AC
  Administered 2016-03-16: 50 mL via INTRAVENOUS

## 2016-03-16 MED ORDER — SODIUM CHLORIDE 0.9 % IV SOLN
Freq: Once | INTRAVENOUS | Status: AC
  Administered 2016-03-16: 10 mL/h via INTRAVENOUS

## 2016-03-16 MED ORDER — DEXTROSE 50 % IV SOLN
INTRAVENOUS | Status: AC
  Administered 2016-03-16: 50 mL
  Filled 2016-03-16: qty 50

## 2016-03-16 MED ORDER — FENTANYL 2500MCG IN NS 250ML (10MCG/ML) PREMIX INFUSION
10.0000 ug/h | INTRAVENOUS | Status: DC
  Administered 2016-03-16: 25 ug/h via INTRAVENOUS
  Filled 2016-03-16: qty 250

## 2016-03-17 LAB — TYPE AND SCREEN
ABO/RH(D): B POS
Antibody Screen: POSITIVE
DAT, IgG: POSITIVE
UNIT DIVISION: 0
UNIT DIVISION: 0
UNIT DIVISION: 0
Unit division: 0
Unit division: 0

## 2016-03-17 LAB — HEMOGLOBIN A1C
HEMOGLOBIN A1C: 4.9 % (ref 4.8–5.6)
MEAN PLASMA GLUCOSE: 94 mg/dL

## 2016-03-19 ENCOUNTER — Encounter (HOSPITAL_COMMUNITY): Payer: Self-pay | Admitting: *Deleted

## 2016-03-19 LAB — CULTURE, BLOOD (ROUTINE X 2)
CULTURE: NO GROWTH
CULTURE: NO GROWTH

## 2016-03-19 NOTE — Progress Notes (Signed)
DC completed and signed by Dr Aundra Dubin, [facility] aware and will p/u today

## 2016-03-28 NOTE — Progress Notes (Signed)
Hypoglycemic Event  CBG: 27  Treatment:25g D50  Symptoms: UTA  Follow-up CBG: Time:832 CBG Result:104  Possible Reasons for Event: Acidosis  Comments/MD notified:Will repeat blood glucose check Q2 hr. Will monitor patient closely. Patient code status changed to DNR/ no escalation of care/increase pressors or starting D10 infusion per Dr. Hollie Salk (nephrology).    Vonna Drafts RN

## 2016-03-28 NOTE — Progress Notes (Signed)
Pt noted to be aystolic on telemetry. Absence of respirations noted. No heart tones on auscultation. Confirmed by myself and Harlan Stains, RN at [phone].

## 2016-03-28 NOTE — Discharge Summary (Signed)
  Advanced Heart Failure Death Summary Note  Death Summary   Patient ID: Robyn Porter MRN: [id number], DOB/AGE: 67-Oct-1952 67 y.o. Admit date: 03/24/2016 D/C date:     04-Apr-2016   Primary Discharge Diagnoses:  1. Hemorrhagic shock 2. Acute Respiratory Failure requiring intubation. 3. Acute on Chronic diastolic CHF: EF [id number] with RV failure 4. Symptomatic bradycardia: MDT PPM -> CRT-P 5. ARF on CKD Stage V 6. Chronic atrial fibrillation 7. OHS/OSA 8.Chronic Anemia: EGD/Colonoscopy 11/2015 AVMs.  9. Chronic venous stasis 10. Morbid obesity 11. ? Left Occipital CVA on CT - though no positive neuro findings.  12. Aortic stenosis:  Moderate by echo 03/02/16.   13. ID: ?Septic shock contributing to hypotension.  WBCs up but no fever.  On empiric abx. Blood cultures sent. Remain NGTD. 60. NSVT   Hospital Course:  Robyn Porter was a 65 year old female with history of Chronic systolic CHF, Anemia, OHS/OSA, symptomatic bradycardia s/p MDT PPM with CRT-P upgrade given high percentage of RV pacing, and CKD stage IV admitted from the HF clinic on 02/27/16 with volume overload.  Labs drawn that morning were also significant for worsening of her chronic anemia requiring transfusion.   Pt noted to have sluggish diuresis, so PICC placed. CVP > 20. Initial Coox down to 41% consistent with cardiogenic shock physiology. Started on milrinone 0.25 mcg/kg/min for RV support. BB stopped. Echo 03/02/16 with LVEF 60-65% and RV poorly visualized. Moderate AS noted.    Hgb improved with transfusion. Urine output remained sluggish despite milrinone and aggressive diuresis with IV lasix bolus > drip. Renal consulted and line placed for CRRT. CRRT started evening of 03/04/16. Bed weights noted to be markedly inaccurate.  CRRT used to pull up to 200-250 ml/hr.  Pt became hypotensive overnight 11/9 into 11/10 and UF had to be pulled back. Midodrine added. Norepi required after BP remained soft despite midodrine.  ART line used for more accurate BP measurement. Weight noted to be down > 100 lbs but CVP remained 16-17, so CVVH continued. UF adjusted as needed.  Levophed stopped 03/08/16. Milrinone stopped 03/10/16. Midodrine down-titrated as able and appropriate.  On 11/16, pt began c/o cramping in right thigh. Shortly after became unresponsive.  CVP noted to be 3. Pt required CPR and intubation. Levophed resumed and increased up to 40, though pressures remained soft.  CT head concerning for possible left occipital infarct but neuro though more likely a hypotensive event from hypovolemia (versus infection/sepsis). Started on empiric ABX.   Pt remained intubated though able to indicate pain in her abdomen.CT abdomen 11/17 showed massive right-sided retroperitoneal hematoma.  Received several units of RBCs + FFP and additional as needed. Pt remained intubated and critically ill.  She began to have intermittent Vtach, persistent hypoglycemia, and became acidotic. Pt made DNR 04/04/2016 after long discussion with family. No escalation of care agreed upon.  Pt continued to decline throughout the day. Pt noted to be asystolic at [id number] pm.    Pt ultimately passed away due to multiple organ failure secondary to hemorrhagic shock complicated by cardiogenic shock and end-stage renal disease.  Every effort was made during this admission to improve this patients clinical picture.    Disposition   The patient has passed away.      Duration of Discharge Encounter: Greater than 35 minutes   Signed, Shirley Friar PA-C 03/19/2016, 7:48 PM

## 2016-03-28 NOTE — Procedures (Signed)
Extubation Procedure Note  Patient Details:   Name: Robyn Porter DOB: 06-22-1950 MRN: [id number]   Airway Documentation:     Evaluation  O2 sats: stable throughout Complications: No apparent complications Patient did tolerate procedure well. Bilateral Breath Sounds: Clear, Diminished   Yes  Pt extubated with family at the bedside. Pt placed on 2lpm [place].   Cordella Register April 01, 2016, 1:53 PM

## 2016-03-28 NOTE — Progress Notes (Addendum)
Patient ID: Robyn Porter, female   DOB: Feb 09, 1951, 65 y.o.   MRN: [id number]    Advanced Heart Failure Rounding Note   Subjective:    Admitted with marked volume overload. Sluggish diuresis despite addition of milrinone and switch to lasix gtt.   Renal consulted 03/04/16 with poor diuresis.  Pt placed on CRRT. Marked weight loss.   CT 11/17 showed massive right-sided retroperitoneal hematoma. Received several units of RBCs and FFP. Remains intubated. On vasopressin and norepinephrine 36. SBP 150s but DBPs in 20s so currently MAP around 55. Sedated on vent this morning, yesterday was following commands. Remains on CVVHD.  CO-ox 71%.   Short runs NSVT this morning.   Echo: EF 60-65%, severe LVH, moderate AS with mean gradient 32 mmHg, RV not well visualized.   Objective:   Weight Range:  Vital Signs:   Temp:  [93.6 F (34.2 C)-98.2 F (36.8 C)] 98.2 F (36.8 C) (11/19 0700) Pulse Rate:  [60-69] 63 (11/19 0700) Resp:  [16-32] 25 (11/19 0700) BP: (126-143)/(29-30) 136/29 (11/18 1451) SpO2:  [97 %-100 %] 97 % (11/19 0700) Arterial Line BP: (113-163)/(21-32) 145/23 (11/19 0700) FiO2 (%):  [40 %] 40 % (11/19 0400) Last BM Date: 03/13/16  Weight change: Filed Weights   03/12/16 0500 03/13/16 0442 03/14/16 0253  Weight: 289 lb 12.8 oz (131.5 kg) 277 lb 1.6 oz (125.7 kg) 260 lb (117.9 kg)    Intake/Output:   Intake/Output Summary (Last 24 hours) at 03/29/2016 0741 Last data filed at 03/29/2016 0700  Gross per 24 hour  Intake          5538.98 ml  Output              997 ml  Net          4541.98 ml     Physical Exam: CVP 8-10 General:  Intubated, awake. Pale HEENT: Normal Neck: Thick. JVP not elevated. Carotids 2+ bilat; no bruits. No thyromegaly or lymphadenopathy noted.    Cor: PMI nondisplaced. Regular. 3/6 systolic crescendo-decrescendo murmur RUSB.   Lungs: Distant, diminished basilar sounds.  Abdomen: obese, soft, NT  Extremities: no cyanosis, clubbing, rash,  Chronic 2-3+ edema into thighs. RIJ Temp dialysis cath.  Neuro: alert on vent.  Moves all 4 extremities w/o difficulty.  Telemetry: Reviewed, underlying afib with V-pacing   Labs: Basic Metabolic Panel:  Recent Labs Lab 03/13/16 0410  03/13/16 2157 03/14/16 0225 03/14/16 0237  03/14/16 1600 03/14/16 1824 03/15/16 0500 03/15/16 1600 2016/03/29 0415  NA 133*  < > 134* 128*  --   < > 133* 130* 135  135 134* 132*  K 4.6  < > 4.3 5.3*  --   < > 5.3* 5.3* 4.6  4.6 4.5 4.6  CL 100*  < > 107 100*  --   < > 106 103 99*  99* 98* 98*  CO2 27  < > 18* 16*  --   < > 13* 10* 14*  14* 19* 19*  GLUCOSE 96  < > 126* 164*  --   < > 70 116* 90  91 68 119*  BUN 17  < > 15 20  --   < > 23* 24* _0 CREATININE 2.11*  < > 1.97* 2.75*  --   < > 3.38* 3.74* 2.74*  2.74* 2.05* 1.77*  CALCIUM 9.3  < > 7.8* 9.1  --   < > 8.4* 8.8* 8.3*  8.3* 8.0* 7.5*  MG 2.4  --  2.3  --  2.5*  --   --   --  2.2  --  2.2  PHOS 4.1  < >  --  7.0*  --   --  9.3*  --  7.7* 5.7* 4.4  < > = values in this interval not displayed.  Liver Function Tests:  Recent Labs Lab 03/14/16 0225 03/14/16 1600 03/15/16 0500 03/15/16 1600 03/19/2016 0415  ALBUMIN 2.6* 1.7* 2.6* 2.5* 2.1*   No results for input(s): LIPASE, AMYLASE in the last 168 hours. No results for input(s): AMMONIA in the last 168 hours.  CBC:  Recent Labs Lab 03/12/16 0414 03/13/16 0410  03/14/16 0237  03/14/16 1824 03/15/16 0030 03/15/16 0500 03/15/16 1500 03/01/2016 0415  WBC 7.5 9.0  < > 18.8*  < > 34.4* 33.4* 23.8* 17.5* 17.6*  NEUTROABS 4.7 5.5  --  15.2*  --   --   --  19.5*  --  15.5*  HGB 9.2* 9.7*  < > 8.5*  < > 6.9* 8.8* 7.5* 8.3* 7.8*  HCT 31.1* 31.9*  < > 27.9*  < > 23.7* 29.5* 24.6* 26.7* 24.1*  MCV 102.3* 101.3*  < > 101.8*  < > 107.2* 98.3 97.2 93.0 92.3  PLT 106* 95*  < > 120*  < > 109* 90* 62* 55* 50*  < > = values in this interval not displayed.  Cardiac Enzymes:  Recent Labs Lab 03/13/16 2157  TROPONINI 0.03*      BNP: BNP (last 3 results)  Recent Labs  07/09/15 1158 07/19/15 1124 03/01/2016 1754  BNP 133.0* 149.0* 429.4*    ProBNP (last 3 results) No results for input(s): PROBNP in the last 8760 hours.    Other results:  Imaging: Ct Abdomen Pelvis Wo Contrast  Addendum Date: 03/14/2016   ADDENDUM REPORT: 03/14/2016 23:04 ADDENDUM: There is a complex cystic mass in the left adnexa measuring 18 x 41 x 51 mm (AP x ML x CC), series 2, image 76 and series 5, image 53. Further evaluation with pelvic ultrasound is recommended. This recommendation follows ACR consensus guidelines: White Paper of the ACR Incidental Findings Committee II on Adnexal Findings. J Am Coll Radiol 2013:10:675-681. These results will be called to the ordering clinician or representative by the Radiologist Assistant, and communication documented in the PACS or zVision Dashboard. Electronically Signed   By: Lance  Furusawa-Stratton M.D.   On: 03/14/2016 23:04   Result Date: 03/14/2016 CLINICAL DATA:  65 y/o F; CHF exacerbation and volume over lobe with concern for sepsis. EXAM: CT ABDOMEN AND PELVIS WITHOUT CONTRAST TECHNIQUE: Multidetector CT imaging of the abdomen and pelvis was performed following the standard protocol without IV contrast. COMPARISON:  None. FINDINGS: Lower chest: Small right pleural effusion. Dependent right lower lobe consolidation. Several calcifications within the area of consolidation as well as calcification right lobe of liver and within the spleen likely represent sequelae of granulomatous disease. There are numerous small pulmonary nodules throughout the lung bases bilaterally. Moderate cardiomegaly. Aortic valvular calcification. Coronary artery calcification. Partially visualized pacemaking leads. Enteric tube with tip in the mid stomach. Hepatobiliary: No focal liver abnormality is seen. No gallstones, gallbladder wall thickening, or biliary dilatation. Pancreas: Unremarkable. No pancreatic ductal  dilatation or surrounding inflammatory changes. Spleen: Multiple calcifications compatible with sequelae of granulomatous disease. Adrenals/Urinary Tract: Adrenal glands are unremarkable. Kidneys are normal, without renal calculi, focal lesion, or hydronephrosis. Bladder is unremarkable collapsed around Foley catheter balloon. Stomach/Bowel: Stomach is within normal limits. Appendix not identified. No evidence of   bowel wall thickening, distention, or inflammatory changes. Vascular/Lymphatic: Aortic atherosclerosis. No enlarged abdominal or pelvic lymph nodes. Reproductive: Status post hysterectomy. No adnexal masses. Other: Massive right-sided retroperitoneal hematoma measuring 10.9 x 12 x 27.5 cm (AP x ML x CC), series 2, image 52 and series 6, image 45. Left groin central venous catheter. Musculoskeletal: Multilevel degenerative changes of the spine. No acute osseous abnormality is evident. IMPRESSION: 1. Massive right-sided retroperitoneal hematoma. 2. Right lower lobe dependent consolidation may represent atelectasis or pneumonia. Small right pleural effusion. 3. No inflammatory or obstructive changes of bowel. These results will be called to the ordering clinician or representative by the Radiologist Assistant, and communication documented in the PACS or zVision Dashboard. Electronically Signed: By: Lance  Furusawa-Stratton M.D. On: 03/14/2016 17:32   Dg Abd Portable 1v  Result Date: 03/14/2016 CLINICAL DATA:  Encounter for OG tube EXAM: PORTABLE ABDOMEN - 1 VIEW COMPARISON:  None. FINDINGS: Enteric tube is seen with the tip in the mid stomach. Nonobstructive bowel gas pattern. IMPRESSION: Enteric tube tip in the mid stomach. Electronically Signed   By: Kevin  Dover M.D.   On: 03/14/2016 14:34     Medications:     Scheduled Medications: . sodium chloride   Intravenous Once  . sodium chloride   Intravenous Once  . sodium chloride   Intravenous Once  . arformoterol  15 mcg Nebulization BID  .  atorvastatin  20 mg Per Tube q1800  . budesonide (PULMICORT) nebulizer solution  0.5 mg Nebulization BID  . ceFEPime (MAXIPIME) IV  2 g Intravenous Q12H  . chlorhexidine gluconate (MEDLINE KIT)  15 mL Mouth Rinse BID  . cholecalciferol  5,000 Units Oral Daily  . darbepoetin (ARANESP) injection - NON-DIALYSIS  100 mcg Subcutaneous Q Wed-1800  . famotidine (PEPCID) IV  20 mg Intravenous Q24H  . hydrocortisone sod succinate (SOLU-CORTEF) inj  50 mg Intravenous Q6H  . ipratropium-albuterol  3 mL Inhalation Q6H  . mouth rinse  15 mL Mouth Rinse 10 times per day  . midodrine  5 mg Per Tube TID WC  . multivitamin with minerals  1 tablet Oral Daily  . sodium chloride flush  10-40 mL Intracatheter Q12H  . THROMBI-PAD  1 each Topical Once  . vancomycin  1,250 mg Intravenous Q24H  . cyanocobalamin  500 mcg Oral Daily  . vitamin C  500 mg Oral BID    Infusions: . norepinephrine (LEVOPHED) Adult infusion 36 mcg/min (03/20/2016 0545)  . dialysis replacement fluid (prismasate) 300 mL/hr at 03/12/2016 0359  . dialysis replacement fluid (prismasate) 200 mL/hr at 03/15/16 1848  . dialysate (PRISMASATE) 2,000 mL/hr at 03/24/2016 0509  .  sodium bicarbonate 150 mEq in sterile water 1000 mL infusion 150 mL/hr at 03/04/2016 0240  . vasopressin (PITRESSIN) infusion - *FOR SHOCK* 0.03 Units/min (03/15/16 2000)    PRN Medications: Place/Maintain arterial line **AND** sodium chloride, fentaNYL (SUBLIMAZE) injection, midazolam, ondansetron (ZOFRAN) IV, sodium chloride flush   Assessment/Plan/Discussion    Miriya Yamamoto is a 64 y.o. female with history with chronic systolic CHF, Anemia, OHS/OSA, symptomatic bradycardia s/p MDT PPm with CRT-P upgrade given high percentage of RV pacing, CKD stage IV.  Admitted with marked volume overload.  1. Hemorrhagic shock: CT 11/17 with massive RP bleed. Received 2u RBCs, 4 FFP and platelets.  Suspect DIC.  - HGb back down to 7.8. Will give another unit RBCs today -  Platelets around 50K.  - Check INR today, not sent yet.   - All anticoagulants are off -   Continue norepi and vasopressin to maintain MAP >55 2. Chronic diastolic CHF: EF 10-31% on echo this admission, RV poorly visualized. Upgrade to [employer] CRT in past given constant RV pacing. Volume status markedly elevated initally. Weight up 30 lbs since August.  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  Renal function stable.  Milrinone begun for RV support.   Had to start CVVH for volume removal given diuretic failure.  Had been able to titrate off milrinone and was on just midodrine.  > 100 lb weight loss with CVVH. Course complicated by severe hypotension due to massive RP bleed. - CVVH now running even.  If she recovers, will likely need long-term HD.   3. Symptomatic bradycardia: MDT PPM. Upgraded to CRT given high percentage RV pacing.  4. ARF on CKD Stage V: Remains on CVVH, running even currently.   - Continue pressor support and midodrine - If she recovers, will end up needing RRT long-term it looks like for volume management.  5. Chronic atrial fibrillation: HR not elevated.  - Off heparin due to RP bleed. 6. OHS/OSA: Now extubated, was on CPAP at night.  7. 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 Given 1UPRBC on admit. Now c/b acute RP bleed. - Give additional PRBCs today.  8. Chronic venous stasis: Chronic changes, but stable.  9. Morbid obesity: PT following. Recommending HHPT vs SNF.   10. Neuro: Question of left occipital CVA on CT but does not have corresponding neuro findings.  Suspect event was related to hypovolemia. Neuro exam now normal. - Dr. Leonie Man does not feel that there has been acute neurological event. 11. Aortic stenosis:  Moderate by echo 03/02/16.  Muffled S2 concerning that AS could be worse.  Would consider evaluation via TEE eventually if she recovers 12. Acute respiratory failure: Intubated post-syncope/arrest.  Empiric abx per  CCM.   13. ID: ?Septic shock contributing to hypotension.  WBCs up but no fever.  On empiric abx. Blood cultures sent. Remain NGTD. 14. NSVT: Discussed with family, will make her DNR.   She remains critically ill with severe baseline comorbidities. Discussed DNR with family.   40 minutes critical care time.   Length of Stay: 17  [name] MD April 10, 2016, 7:41 AM  Advanced Heart Failure Team Pager [phone] (M-F; 7a - 4p)  Please contact [facility] for night-coverage after hours (4p -7a ) and weekends on [email or web address]

## 2016-03-28 NOTE — Progress Notes (Addendum)
Pt. Extremely mottled.  Noticed legs mottled at beginning of shift.  This morning mottling has moved to abdomen and chest.  Dr. Ashok Cordia notified, instructed to keep patient warm.  Hemoglobin 7.8 this am and Ca+ 7.5, MD made aware.  Glucose 42, 50 of dextrose IV push given.  Glucose now is 88.  Will continue to monitor.

## 2016-03-28 NOTE — Procedures (Signed)
Patient seen and examined on CRRT.  Qb 150, UF goal: none.   Continues to be critically ill, family discussing goals of care.  [name] [facility] Cell [phone] pgr [phone] 11:22 AM

## 2016-03-28 NOTE — Progress Notes (Signed)
Hypoglycemic Event  CBG: 46  Treatment: 25g D50 Symptoms: UTA  Follow-up CBG: Time:1055 CBG Result:124  Possible Reasons for Event: Acidosis  Comments/MD notified:Verbal order for D50 per Dr. Aundra Dubin. Will monitor patient closely and repeat blood glucose check Q1hr.    Vonna Drafts

## 2016-03-28 NOTE — Progress Notes (Signed)
Wasted approximately 168mL of fentanyl into sink. Allena Katz RN witnessed waste.  Sydnee Cabal. Lovena Le RN

## 2016-03-28 NOTE — Progress Notes (Signed)
PULMONARY / CRITICAL CARE MEDICINE   Name: Robyn Porter MRN: [id number] DOB: [birth year]    ADMISSION DATE:  03/26/2016 CONSULTATION DATE: 04-06-2016  REFERRING MD:  Haroldine Laws, D  CHIEF COMPLAINT:  Respiratory arrest  HISTORY OF PRESENT ILLNESS:   Robyn Porter is a 65 y.o. female chronic dCHF, AS, chronic afib on Xarelto (at home), chronic anemia due to renal failure, Hx of GI bleeds, and OHS/OSA who was admitted with marked fluid overload two weeks ago. She failed diuresis with lasix gtt and started on CRRT by nephrology starting 03/04/2016. Weight down by 113 pounds over this admission. Most recent vital sign was significant for 80/44. Bmet was significant for HypoNa to 132 and sCr of 2.18. Last CXR was on 11/07 and significant for pulm edema. She hasn't had EKG or troponin this admission. She has received 2 tablets of Vicodin 5/325 mg 11/16.  11/16 pt had leg cramps and CVVHD stopped, developed diplopia and then progressive AMS. Evolved to resp arrest requiring MV. Initially was called as a code stroke  CULTURES: None  ANTIBIOTICS: None  SIGNIFICANT EVENTS: 11/01: admitted due to marked fluid overload 11/07: CRRT started 11/16: respiratory arrest  LINES/TUBES: 03/04/2016: HD cath in Rt IJ 03/01/2016: Foley cath 03/13/2016: PIV 03/03/2016: A-line   EVETNS 11/17 Intubated Hypotensive but ? Accurate pressure Wakes to voice Head Ct > ? L occipital artifact vs early ischemia   11/18 =- remains intubated. CT abd yesterday due to abd pain and rising lactate with large RP hematoma. On 40% fio2. Off heparin gtt. Not on sedation gtt -> wua - > follows commands. On 25 levophed and shock dose vasopressin. RN reports that patient is alert when MAP> 55 but otherwise gets less responsive. On CRRT.. On bicarb gtt. Running even.Neices at bedside and nephew - questions on prognosis   SUBJECTIVE/OVERNIGHT/INTERVAL HX Apr 06, 2016 - patient now DNR with no escalation.One neiceat bedside  -says they are contemplating terminal wean. eMD noticed low diastolic chf and hig sbp. Onprn sedation  - per RN RASS -2/-3  VITAL SIGNS: BP (!) 136/29   Pulse 68   Temp 98.6 F (37 C) (Core (Comment))   Resp (!) 22   Ht 5\' 4"  (1.626 m)   Wt 117.9 kg (260 lb)   SpO2 97%   BMI 44.63 kg/m   HEMODYNAMICS: CVP:  [6 mmHg-14 mmHg] 12 mmHg  VENTILATOR SETTINGS: Vent Mode: PRVC FiO2 (%):  [40 %] 40 % Set Rate:  [14 bmp] 14 bmp Vt Set:  [420 mL] 420 mL PEEP:  [5 cmH20] 5 cmH20 Pressure Support:  [5 cmH20] 5 cmH20 Plateau Pressure:  [10 [id number] cmH20] 24 cmH20  INTAKE / OUTPUT: I/O last 3 completed shifts: In: 10175.3 [I.V.:6972.3; Blood:2523; NG/GT:230; IV Piggyback:450] Out: [id number] [Emesis/NG output:100; Other:1520]  PHYSICAL EXAMINATION: General:  Obese, lying in bed Neuro:Sedated and intubated. RASS-2/-3 on prn sedation HEENT:  Normocephalic, ETT in place, pupils equal, ~3 mm, minimally reactive to light Cardiovascular: RRR, 3/6 SEM over R and LUSB Lungs: intubated, air movement bilaterally, no wheeze or crackles Abdomen: obese, soft, limited exam Musculoskeletal: lipidemia  Skin: dry scaly skin in LE Back -bed sore per RN  LABS:   PULMONARY  Recent Labs Lab 03/13/16 2327  03/14/16 1349  03/14/16 1820 03/15/16 0103 03/15/16 0333 03/15/16 0335 03/15/16 1528 03/15/16 1815 04/06/2016 0425  PHART 7.303*  --  7.133*  --  7.107* 7.161*  --  7.183* 7.330*  --   --   PCO2ART 41.6  --  29.7*  --  33.7 32.4  --  33.9 43.2  --   --   PO2ART 320.0*  --  145.0*  --  109* 102.0  --  116* 91.0  --   --   HCO3 20.7  --  10.0*  --  10.2* 11.6*  --  12.4* 22.8  --   --   TCO2 22  --  11  --   --  13  --   --  24  --   --   O2SAT 100.0  < > 99.0  < > 98.7 96.0 78.8 97.8 96.0 77.5 70.0  < > = values in this interval not displayed.  CBC  Recent Labs Lab 03/15/16 0500 03/15/16 1500 03/19/2016 0415  HGB 7.5* 8.3* 7.8*  HCT 24.6* 26.7* 24.1*  WBC 23.8* 17.5* 17.6*  PLT 62* 55*  50*    COAGULATION  Recent Labs Lab 03/14/16 2030 03/15/16 0843 03-19-16 0820  INR 3.23 2.68 2.74    CARDIAC    Recent Labs Lab 03/13/16 2157  TROPONINI 0.03*   No results for input(s): PROBNP in the last 168 hours.   CHEMISTRY  Recent Labs Lab 03/13/16 0410  03/13/16 2157 03/14/16 0225 03/14/16 0237  03/14/16 1600 03/14/16 1824 03/15/16 0500 03/15/16 1600 March 19, 2016 0415  NA 133*  < > 134* 128*  --   < > 133* 130* 135  135 134* 132*  K 4.6  < > 4.3 5.3*  --   < > 5.3* 5.3* 4.6  4.6 4.5 4.6  CL 100*  < > 107 100*  --   < > 106 103 99*  99* 98* 98*  CO2 27  < > 18* 16*  --   < > 13* 10* 14*  14* 19* 19*  GLUCOSE 96  < > 126* 164*  --   < > 70 116* 90  91 68 119*  BUN 17  < > 15 20  --   < > 23* 24* 16  17 12 11   CREATININE 2.11*  < > 1.97* 2.75*  --   < > 3.38* 3.74* 2.74*  2.74* 2.05* 1.77*  CALCIUM 9.3  < > 7.8* 9.1  --   < > 8.4* 8.8* 8.3*  8.3* 8.0* 7.5*  MG 2.4  --  2.3  --  2.5*  --   --   --  2.2  --  2.2  PHOS 4.1  < >  --  7.0*  --   --  9.3*  --  7.7* 5.7* 4.4  < > = values in this interval not displayed. Estimated Creatinine Clearance: 39.5 mL/min (by C-G formula based on SCr of 1.77 mg/dL (H)).   LIVER  Recent Labs Lab 03/14/16 0225 03/14/16 1600 03/14/16 2030 03/15/16 0500 03/15/16 0843 03/15/16 1600 03-19-16 0415 03-19-16 0820  ALBUMIN 2.6* 1.7*  --  2.6*  --  2.5* 2.1*  --   INR  --   --  3.23  --  2.68  --   --  2.74     INFECTIOUS  Recent Labs Lab 03/15/16 0035 03/15/16 1500 2016/03/19 0015  LATICACIDVEN 12.97* 8.9* 8.2*     ENDOCRINE CBG (last 3)   Recent Labs  03/19/16 0811 03/19/16 0832 March 19, 2016 1033  GLUCAP 27* 104* 46*         IMAGING x48h  - image(s) personally visualized  -   highlighted in bold Ct Abdomen Pelvis Wo Contrast  Addendum Date: 03/14/2016   ADDENDUM REPORT: 03/14/2016 23:04  ADDENDUM: There is a complex cystic mass in the left adnexa measuring 18 x 41 x 51 mm (AP x ML x CC),  series 2, image 76 and series 5, image 53. Further evaluation with pelvic ultrasound is recommended. This recommendation follows ACR consensus guidelines: White Paper of the ACR Incidental Findings Committee II on Adnexal Findings. J Am Coll Radiol [phone]. These results will be called to the ordering clinician or representative by the Radiologist Assistant, and communication documented in the PACS or zVision Dashboard. Electronically Signed   By: Kristine Garbe M.D.   On: 03/14/2016 23:04   Result Date: 03/14/2016 CLINICAL DATA:  65 y/o F; CHF exacerbation and volume over lobe with concern for sepsis. EXAM: CT ABDOMEN AND PELVIS WITHOUT CONTRAST TECHNIQUE: Multidetector CT imaging of the abdomen and pelvis was performed following the standard protocol without IV contrast. COMPARISON:  None. FINDINGS: Lower chest: Small right pleural effusion. Dependent right lower lobe consolidation. Several calcifications within the area of consolidation as well as calcification right lobe of liver and within the spleen likely represent sequelae of granulomatous disease. There are numerous small pulmonary nodules throughout the lung bases bilaterally. Moderate cardiomegaly. Aortic valvular calcification. Coronary artery calcification. Partially visualized pacemaking leads. Enteric tube with tip in the mid stomach. Hepatobiliary: No focal liver abnormality is seen. No gallstones, gallbladder wall thickening, or biliary dilatation. Pancreas: Unremarkable. No pancreatic ductal dilatation or surrounding inflammatory changes. Spleen: Multiple calcifications compatible with sequelae of granulomatous disease. Adrenals/Urinary Tract: Adrenal glands are unremarkable. Kidneys are normal, without renal calculi, focal lesion, or hydronephrosis. Bladder is unremarkable collapsed around Foley catheter balloon. Stomach/Bowel: Stomach is within normal limits. Appendix not identified. No evidence of bowel wall thickening,  distention, or inflammatory changes. Vascular/Lymphatic: Aortic atherosclerosis. No enlarged abdominal or pelvic lymph nodes. Reproductive: Status post hysterectomy. No adnexal masses. Other: Massive right-sided retroperitoneal hematoma measuring 10.9 x 12 x 27.5 cm (AP x ML x CC), series 2, image 52 and series 6, image 45. Left groin central venous catheter. Musculoskeletal: Multilevel degenerative changes of the spine. No acute osseous abnormality is evident. IMPRESSION: 1. Massive right-sided retroperitoneal hematoma. 2. Right lower lobe dependent consolidation may represent atelectasis or pneumonia. Small right pleural effusion. 3. No inflammatory or obstructive changes of bowel. These results will be called to the ordering clinician or representative by the Radiologist Assistant, and communication documented in the PACS or zVision Dashboard. Electronically Signed: By: Kristine Garbe M.D. On: 03/14/2016 17:32   Dg Abd Portable 1v  Result Date: 03/14/2016 CLINICAL DATA:  Encounter for OG tube EXAM: PORTABLE ABDOMEN - 1 VIEW COMPARISON:  None. FINDINGS: Enteric tube is seen with the tip in the mid stomach. Nonobstructive bowel gas pattern. IMPRESSION: Enteric tube tip in the mid stomach. Electronically Signed   By: Rolm Baptise M.D.   On: 03/14/2016 14:34     DISCUSSION: Halina Lawhorne is a 65 y.o. female chronic dCHF, AS, chronic afib on Xarelto (at home, not on anticoag here except with CRRT), CKD-4, Hx of GI bleeds, and OHS/OSA who was admitted with marked fluid overload requiring CRRT. Patient had hypotension, progressive lethargy, developed progressive dyspnea and respiratory arrest. Differential diagnosis are CVA (hx of Afib but PTT>200), Opiate (on high dose norco here), ACS, Pneumonia  ASSESSMENT / PLAN:  PULMONARY A: VDRF - following syncope with subsequent respiratory arrest.  Unclear etiology at this point but suspect multifactorial hypotension due to diuresis, narcs, BP  meds, etc.  Doubt PE given chronic anticoagulation for A.fib (xarelto) Pulmonary edema.  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    - does not meet medical  extubation  Criteria 03-31-16 P:   PRVC 8cc/kg   NEUROLOGIC A:   Syncope - unclear etiology at this point. ?opiate Acute encephalopathy - due to chemical sedation.   - followed commands. perRN MAP needs to be> 55 to maintain mental status -0n 11/18  - on 11/19 - RASS -/2/-3 on prn sedation  P:   Sedation:  Fentanyl gtt ordered in case o terminalwean RASS goal: 0 to -1. During acute care but -4 if they decide on terminal wean  CARDIOVASCULAR A:  Hypotension - concern cardiogenic shock. Hx dCHF (Echo from 11/5 with EF 60-65%, severe LVH), A.fib (on chronic xarelto), HTN, AS, bradycardia s/p PPM.   - ongoing circulator shock. Wide pulse pressure +  P:  Vasopressors for sbp goal > 95. Continue midodrine. Avoid anticoagulation due to RP bleed 03/14/16 Continue preadmission atorvastatin. Hold preadmission metolazone, toprol-xl, torsemide  RENAL A:   CKD - started on CRRT and will likely progress to needing full HD.   -on CRRT. Family may stop this Mar 31, 2016  P:   Per renal  GASTROINTESTINAL A:   GI prophylaxis. Nutrition. Obesity. P: SUP: Pantoprazole. NPO.    HEMATOLOGIC A:   Anemia. On chronic anticoagulation for A.fib (xarelto).  New massive RP bleed11/17/17 - ?  xareltoand renal insuff as risk factors   P:  Transfuse for Hgb < 7. Monitor platelet counts. Avoid heparin gtt ordered Follow CBC  INFECTIOUS A:   No clear evidence infection Possible sepsis  P:   Anti-infectives    Start     Dose/Rate Route Frequency Ordered Stop   03/15/16 1800  vancomycin (VANCOCIN) 1,250 mg in sodium chloride 0.9 % 250 mL IVPB     1,250 mg 166.7 mL/hr over 90 Minutes Intravenous Every 24 hours 03/14/16 1902     03/15/16 0600  ceFEPIme  (MAXIPIME) 2 g in dextrose 5 % 50 mL IVPB     2 g 100 mL/hr over 30 Minutes Intravenous Every 12 hours 03/14/16 1902     03/15/16 0200  vancomycin (VANCOCIN) 1,250 mg in sodium chloride 0.9 % 250 mL IVPB  Status:  Discontinued     1,250 mg 166.7 mL/hr over 90 Minutes Intravenous Every 24 hours 03/13/16 2341 03/14/16 0147   03/15/16 0000  ceFEPIme (MAXIPIME) 2 g in dextrose 5 % 50 mL IVPB  Status:  Discontinued     2 g 100 mL/hr over 30 Minutes Intravenous Every 24 hours 03/14/16 0147 03/14/16 1125   03/15/16 0000  ceFEPIme (MAXIPIME) 500 mg in dextrose 5 % 50 mL IVPB  Status:  Discontinued     500 mg 100 mL/hr over 30 Minutes Intravenous Every 24 hours 03/14/16 1125 03/14/16 2132   03/14/16 0000  ceFEPIme (MAXIPIME) 2 g in dextrose 5 % 50 mL IVPB  Status:  Discontinued     2 g 100 mL/hr over 30 Minutes Intravenous Every 12 hours 03/13/16 2341 03/14/16 0147   03/13/16 2345  vancomycin (VANCOCIN) 2,500 mg in sodium chloride 0.9 % 500 mL IVPB     2,500 mg 250 mL/hr over 120 Minutes Intravenous  Once 03/13/16 2341 03/14/16 0259        ENDOCRINE A:   No acute issues. P:   No interventions required.  Family updated: Multiple family members updated at bedside.03/15/2016. On 03-31-2016 =- one niece confirmed  in presence of RN thaty patient is DNR without escalation of pressors but continue current active care.They are contemplating about terminal wean next 12-24h. Start fent gtt Interdisciplinary Family Meeting v Palliative Care Meeting:  Due by: 03/21/16.    The patient is critically ill with multiple organ systems failure and requires high complexity decision making for assessment and support, frequent evaluation and titration of therapies, application of advanced monitoring technologies and extensive interpretation of multiple databases.   Critical Care Time devoted to patient care services described in this note is  30  Minutes. This time reflects time of care of this signee Dr  Brand Males. This critical care time does not reflect procedure time, or teaching time or supervisory time of PA/NP/Med student/Med Resident etc but could involve care discussion time    Dr. Brand Males, M.D., [facility].C.P Pulmonary and Critical Care Medicine Staff Physician [facility] Pager: [phone], If no answer or between  15:00h - 7:00h: call [phone]  Apr 13, 2016 11:19 AM

## 2016-03-28 NOTE — Progress Notes (Signed)
[facility] Rounding Note  Subjective/Interval History:   Having intermittent Vtach and persistent hypoglycemia.   Made DNR overnight.    Long discussion with family this AM --> do not escalate care.  Thinking about transitioning to comfort.  Objective Vital signs in last 24 hours: Vitals:   Apr 09, 2016 0747 04-09-2016 0800 2016-04-09 0815 2016-04-09 0830  BP:      Pulse: 70 66 68 68  Resp: (!) 27 (!) 28 (!) 26 (!) 22  Temp:  98.6 F (37 C) 98.6 F (37 C) 98.6 F (37 C)  TempSrc:  Core (Comment) Core (Comment) Core (Comment)  SpO2: 98% 96% 94% 97%  Weight:      Height:       Weight change:   Intake/Output Summary (Last 24 hours) at 04-09-16 1115 Last data filed at 09-Apr-2016 1100  Gross per 24 hour  Intake          4966.82 ml  Output             1138 ml  Net          3828.82 ml    HEMODYNAMICS: CVP:  [13 mmHg-17 mmHg] 13 mmHg  Physical Exam: Blood pressure (!) 110/46, pulse 60, temperature 97.8 F (36.6 C), temperature source Oral, resp. rate 16, height 5' 4"  (1.626 m), weight 289 lb 12.8 oz (131.5 kg), SpO2 92 %. GEN: intubated, sedated, doesn't follow commands HEENT: ETT in place NECK: R dialysis catheter in place PULM: coarse CV tachycardic, III/VI systolic murmur, more prominent than prior ABD: obese, no flank bruising from what I can tell EXT: + venous stasis, anasarca, worsened NEURO: withdraws to pain, appears to have some myoclonic jerking   Recent Labs Lab 03/13/16 0410 03/13/16 1623  03/14/16 0225 03/14/16 0700 03/14/16 1345 03/14/16 1600 03/14/16 1824 03/15/16 0500 03/15/16 1600 09-Apr-2016 0415  NA 133* 132*  < > 128* 130* 131* 133* 130* 135  135 134* 132*  K 4.6 4.9  < > 5.3* 5.7* 6.3* 5.3* 5.3* 4.6  4.6 4.5 4.6  CL 100* 100*  < > 100* 101 103 106 103 99*  99* 98* 98*  CO2 27 25  < > 16* 16* 11* 13* 10* 14*  14* 19* 19*  GLUCOSE 96 121*  < > 164* 122* 41* 70 116* 90  91 68 119*  BUN 17 17  < > 20 22* 23* 23* 24* 16  17 12 11   CREATININE 2.11* 2.18*  <  > 2.75* 3.15* 3.50* 3.38* 3.74* 2.74*  2.74* 2.05* 1.77*  CALCIUM 9.3 9.5  < > 9.1 9.3 9.5 8.4* 8.8* 8.3*  8.3* 8.0* 7.5*  PHOS 4.1 4.7*  --  7.0*  --   --  9.3*  --  7.7* 5.7* 4.4  < > = values in this interval not displayed.   Recent Labs Lab 03/15/16 0500 03/15/16 1600 2016-04-09 0415  ALBUMIN 2.6* 2.5* 2.1*     Recent Labs Lab 03/13/16 0410  03/14/16 0237  03/15/16 0030 03/15/16 0500 03/15/16 1500 09-Apr-2016 0415  WBC 9.0  < > 18.8*  < > 33.4* 23.8* 17.5* 17.6*  NEUTROABS 5.5  --  15.2*  --   --  19.5*  --  15.5*  HGB 9.7*  < > 8.5*  < > 8.8* 7.5* 8.3* 7.8*  HCT 31.9*  < > 27.9*  < > 29.5* 24.6* 26.7* 24.1*  MCV 101.3*  < > 101.8*  < > 98.3 97.2 93.0 92.3  PLT 95*  < > 120*  < >  90* 62* 55* 50*  < > = values in this interval not displayed. Results for LATEKA, RADY (MRN [id number]) as of 03/06/2016 07:28  Ref. Range 03/04/2016 14:10  PTH Latest Ref Range: 15 - 65 pg/mL 50    Medications: . norepinephrine (LEVOPHED) Adult infusion 40 mcg/min (03-29-16 0735)  . dialysis replacement fluid (prismasate) 300 mL/hr at 2016-03-29 0359  . dialysis replacement fluid (prismasate) 200 mL/hr at 03/15/16 1848  . dialysate (PRISMASATE) 2,000 mL/hr at 2016/03/29 1027  .  sodium bicarbonate 150 mEq in sterile water 1000 mL infusion 150 mL/hr at March 29, 2016 1009  . vasopressin (PITRESSIN) infusion - *FOR SHOCK* 0.03 Units/min (03/15/16 2000)   . sodium chloride   Intravenous Once  . sodium chloride   Intravenous Once  . sodium chloride   Intravenous Once  . arformoterol  15 mcg Nebulization BID  . atorvastatin  20 mg Per Tube q1800  . budesonide (PULMICORT) nebulizer solution  0.5 mg Nebulization BID  . ceFEPime (MAXIPIME) IV  2 g Intravenous Q12H  . chlorhexidine gluconate (MEDLINE KIT)  15 mL Mouth Rinse BID  . cholecalciferol  5,000 Units Oral Daily  . darbepoetin (ARANESP) injection - NON-DIALYSIS  100 mcg Subcutaneous Q Wed-1800  . famotidine (PEPCID) IV  20 mg Intravenous Q24H  .  hydrocortisone sod succinate (SOLU-CORTEF) inj  50 mg Intravenous Q6H  . ipratropium-albuterol  3 mL Inhalation Q6H  . mouth rinse  15 mL Mouth Rinse 10 times per day  . midodrine  5 mg Per Tube TID WC  . multivitamin with minerals  1 tablet Oral Daily  . sodium chloride flush  10-40 mL Intracatheter Q12H  . THROMBI-PAD  1 each Topical Once  . vancomycin  1,250 mg Intravenous Q24H  . cyanocobalamin  500 mcg Oral Daily  . vitamin C  500 mg Oral BID     Background: 65 y.o. 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 Hollie Salk; Baseline worsening over course of 2017),  super morbid obesity, OHS/OSA (CPAP), dCHF, AS, pacemaker, AF. Admitted at least 3X 2017 with massive volume overload that responded to diuretics on those occasions (05/2015, 08/2015, 11/2015)  Admitted 11/1 with 30 lb weight increase since August, SOB, worsening edema. Failed high dose lasix and metolazone + milrinone w/inability to achieve neg fluid balance.Creatinine had been 2.4-2.7 during the admission.  Renal consulted for CRRT.  Pt  acknowledged/accepted that she could end up on dialysis permanently. Catheter placed and CRRT started 11/7.   Assessment/Recommendations   1.  Diuretic refractory CHF/volume overload - CRRT started late 11/7 - goal initially 300 off.Has RP bleed now, no heparin either in circuit or systemically.  Attempting to correct metabolic acidosis with CRRT.   2.  Shock: hemorrhagic.  hgb nadired at 6.9.  Getting blood, complicated since she has antibodies.  Multiple units of pRBCs. On norepi and vaso.  Septic shock may be contributing (possible PNA on CT scan) so empiric antibiotics started (11/16-).  Serial CBCs and coag profiles.  No escalation of pressor  3.  Coagulopathy: looks like DIC.  Continue supportive care--> FFP, cryo, etc.  4.  Metabolic anion gap acidosis: 2/2 elevated lactate.  CRRT as above.  Also on bicarb gtt.  5.  Acute respiratory failure: AMS/ hypoxia.  Intubated.  Per CCM  6.  CKD4  -Baseline creatinine has been worsening over the course of 2017 - most recently  2.4-2.6.  Patient would be interested in [place] as HD site.  No renal recovery, she is essentially  ESRD.  7.  Acute blood loss anemia/ massive RP bleed: transfuse prn.  For anemia of chronic disease had started aranesp 100 q week (11/8-);   8.  Hypoglycemia; likely due to hepatic dysfunction (failure of gluconeogenesis/ glycogenolysis).  No starting D10 gtt   9.  Vtach: nonsustained.  Will try to keep electrolytes optimized while still continuing aggressive measures.  8.  Dispo: critically ill.  Do not see her leaving hospital.  Family discussing goals of care.   Other less active issues:  Chronic dCHF. EF 60-65%. Suspicion primary RV failure. ECHO this admission "nl RV fx" but RV not well visualized. Milrinone d/c'ed 11/13.  AS - "moderate" on TTE. Dr. Aundra Dubin indicates that TEE best way to assess.  Pacemaker status AFib - no anticoagulation Metabolic bone - PTH 50, no intervention needed. Ca and phos OK OHS/OSA - CPAP at night. Has own machine.  Intubated now Super morbid obesity.   [name] [facility] Cell [phone] pgr [phone] 03-20-16, 11:15 AM

## 2016-03-28 DEATH — deceased

## 2016-03-31 ENCOUNTER — Ambulatory Visit: Payer: Medicare Other | Admitting: Hematology and Oncology

## 2017-10-26 IMAGING — DX DG CHEST 2V
2 series · 2 of 2 positions shown · non-contrast
Comparison: None.

CLINICAL DATA: 64-year-old female with shortness of breath and
cough for the past month

EXAM:
CHEST  2 VIEW

[chest pa]
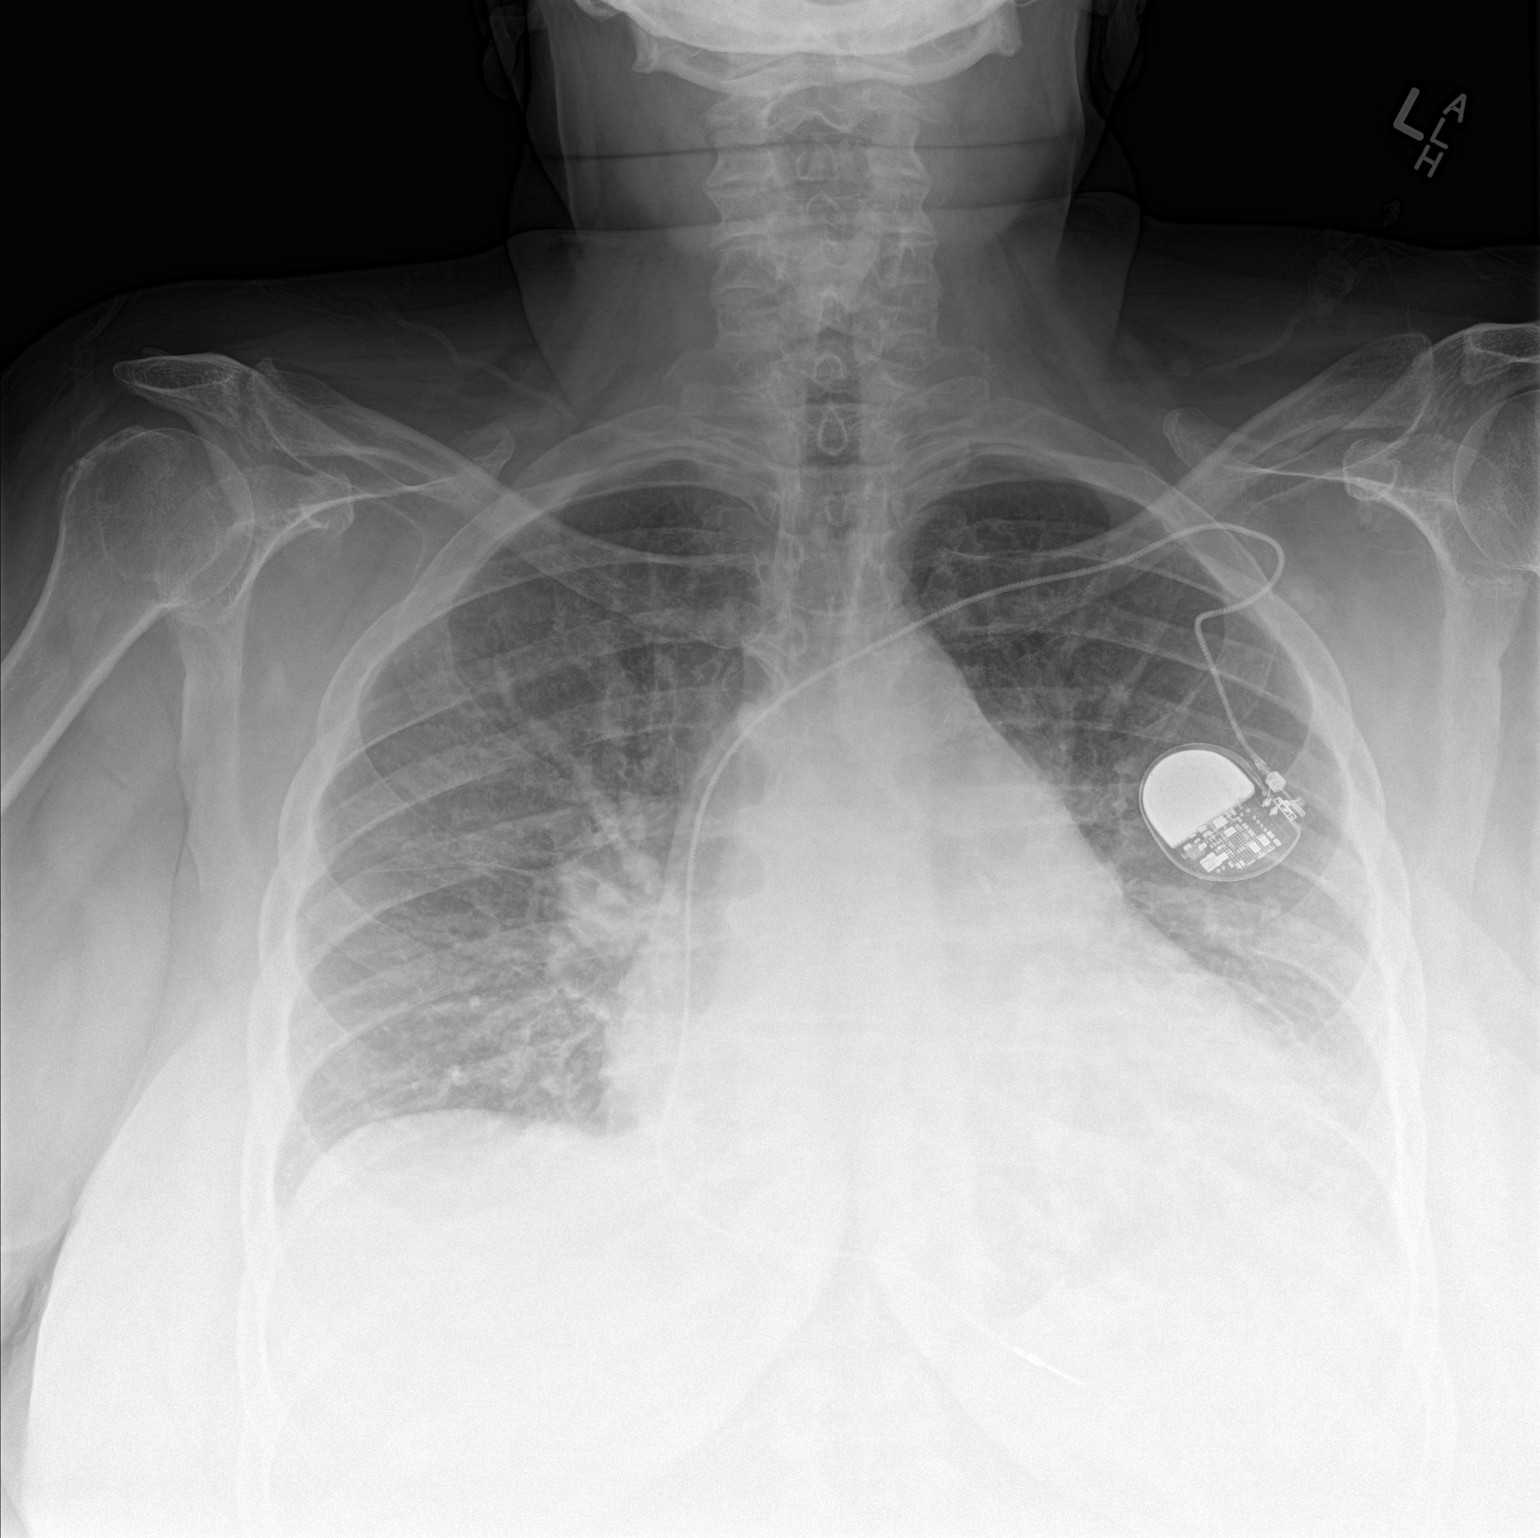

[chest lat]
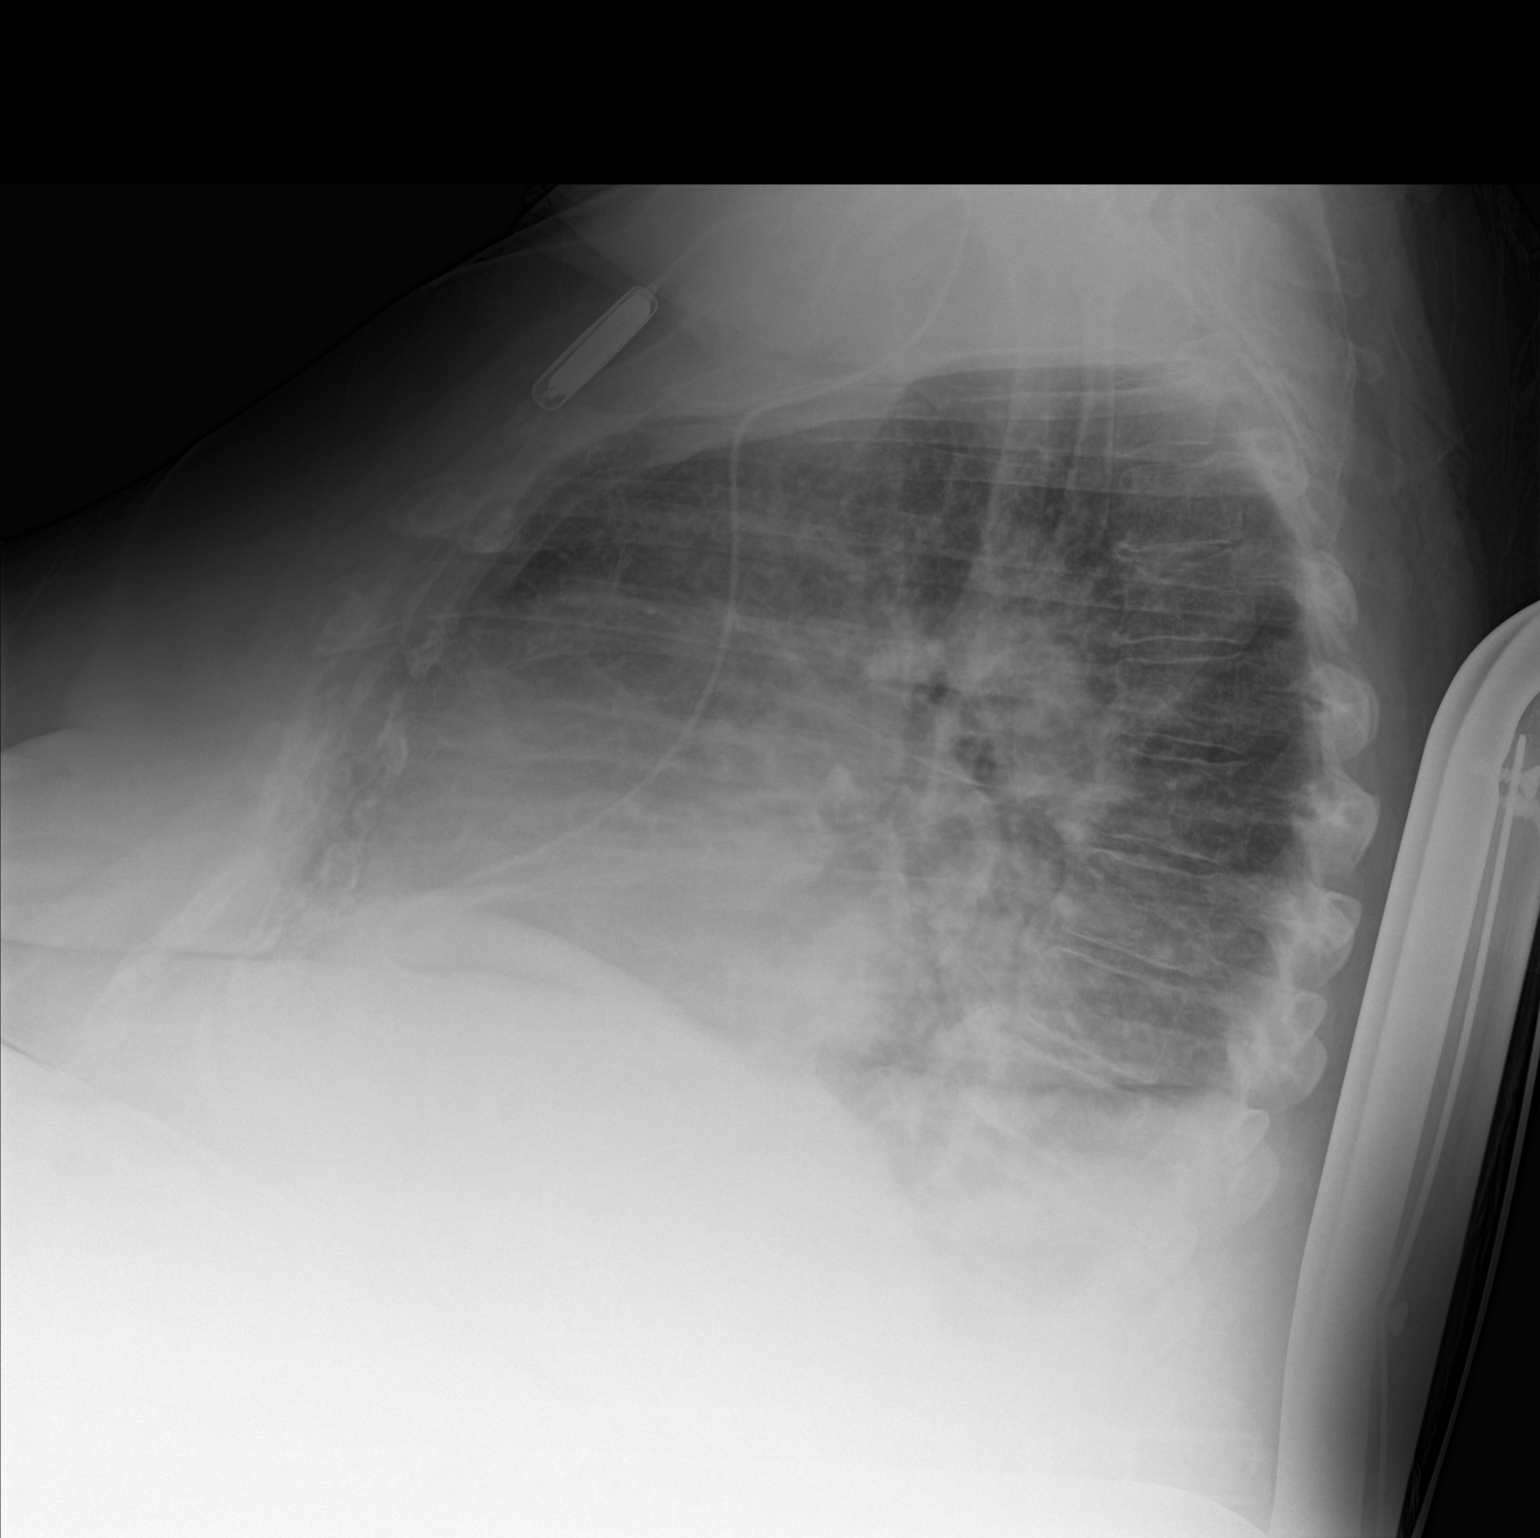

[2 of 2 positions shown; findings below may reference images not displayed]

FINDINGS: Left subclavian approach single lead cardiac rhythm maintenance
device with the lead projected over the right ventricle.
Cardiomegaly. Pulmonary vascular congestion with mild interstitial
edema. Small bilateral pleural effusions with associated bibasilar
atelectasis. No acute osseous abnormality.
IMPRESSION: Mild-moderate CHF.

## 2017-10-29 IMAGING — CR DG CHEST 1V PORT
1 series · 1 of 1 positions shown · non-contrast
Comparison: 06/12/2015

CLINICAL DATA: Evaluate line placement.

EXAM:
PORTABLE CHEST 1 VIEW

[AP]
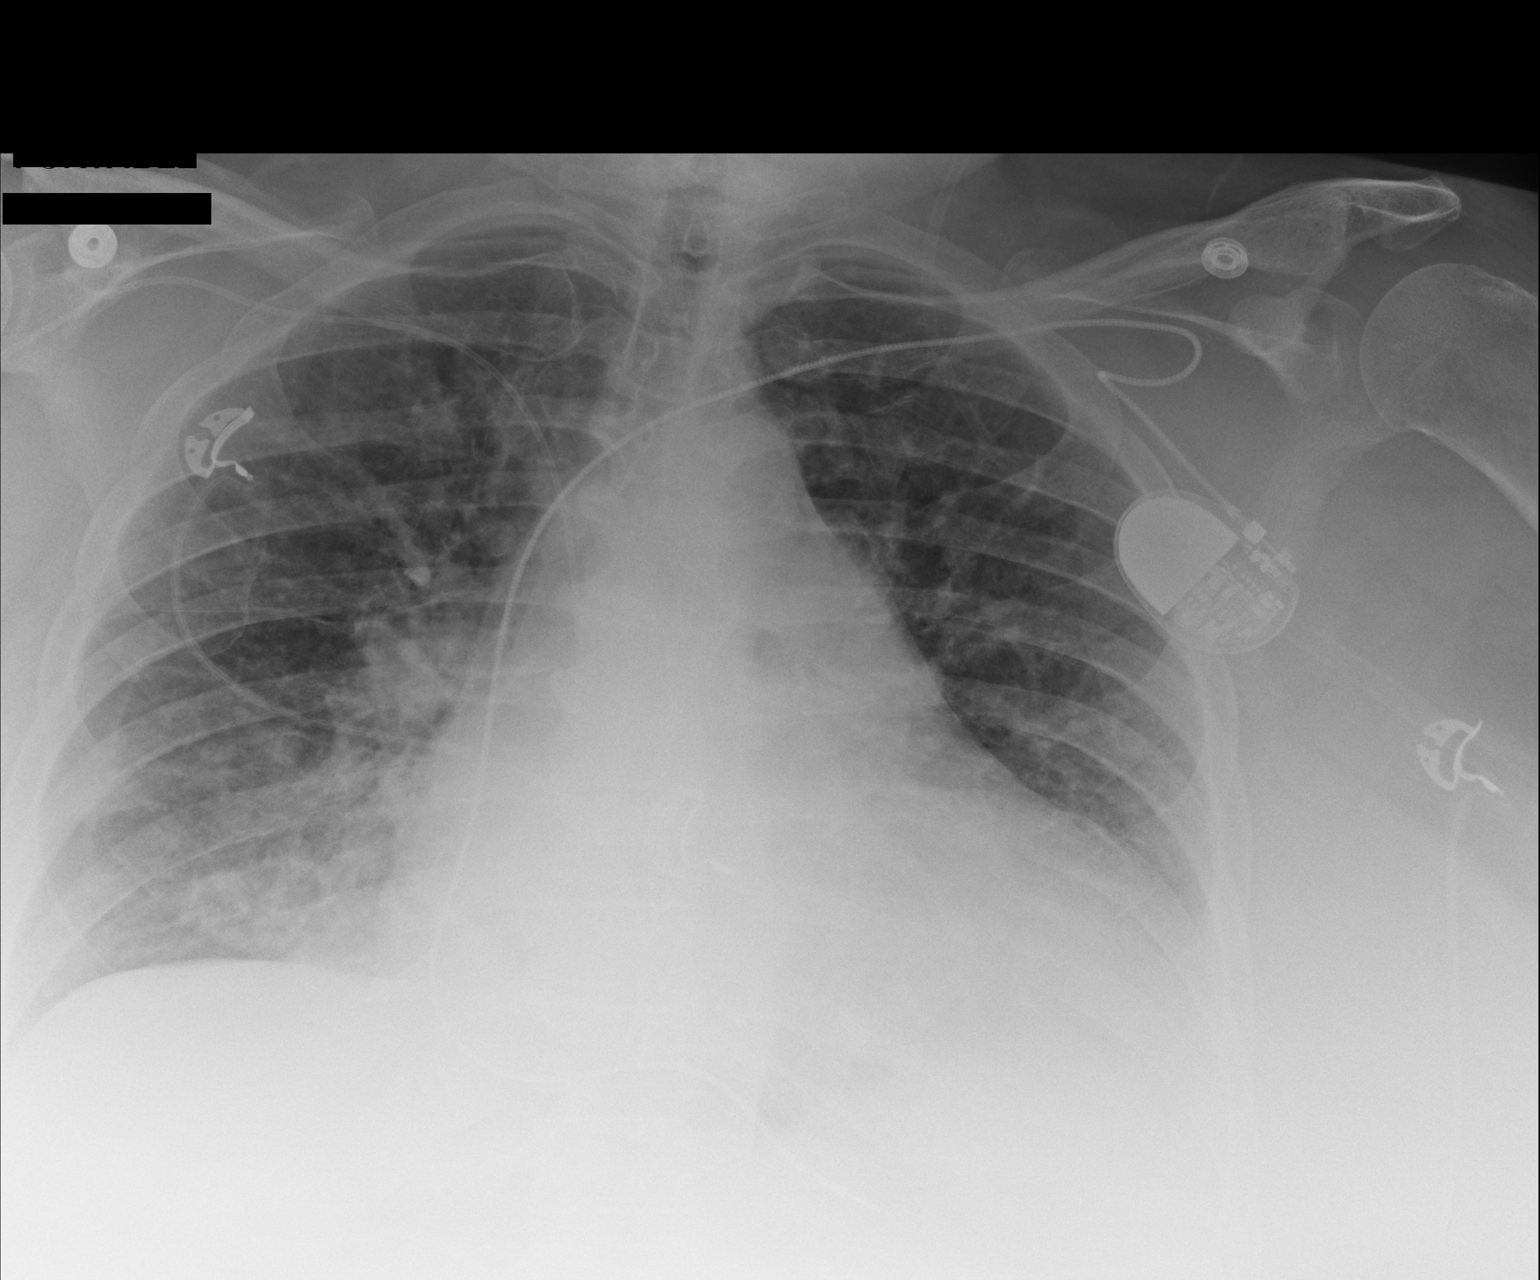

[1 of 1 positions shown; findings below may reference images not displayed]

FINDINGS: Again noted is a single lead left cardiac pacemaker. Placement of a
right arm PICC line. The PICC catheter extends into the SVC but the
tip is not well visualized. Heart is enlarged. Again noted are
prominent central vascular structures. Trachea is midline. Negative
for a pneumothorax.
IMPRESSION: PICC line extends into the SVC region but the tip is not well
visualized. Consider further evaluation with oblique images or
lateral view.

Again noted is cardiomegaly with enlarged central vascular
structures. Findings are suggestive for vascular congestion or mild
edema.

## 2018-04-13 IMAGING — CR DG CHEST 2V
2 series · 2 of 2 positions shown · non-contrast
Comparison: Radiographs September 20, 2015.

CLINICAL DATA: Congestion, weakness.

EXAM:
CHEST  2 VIEW

[chest lat]
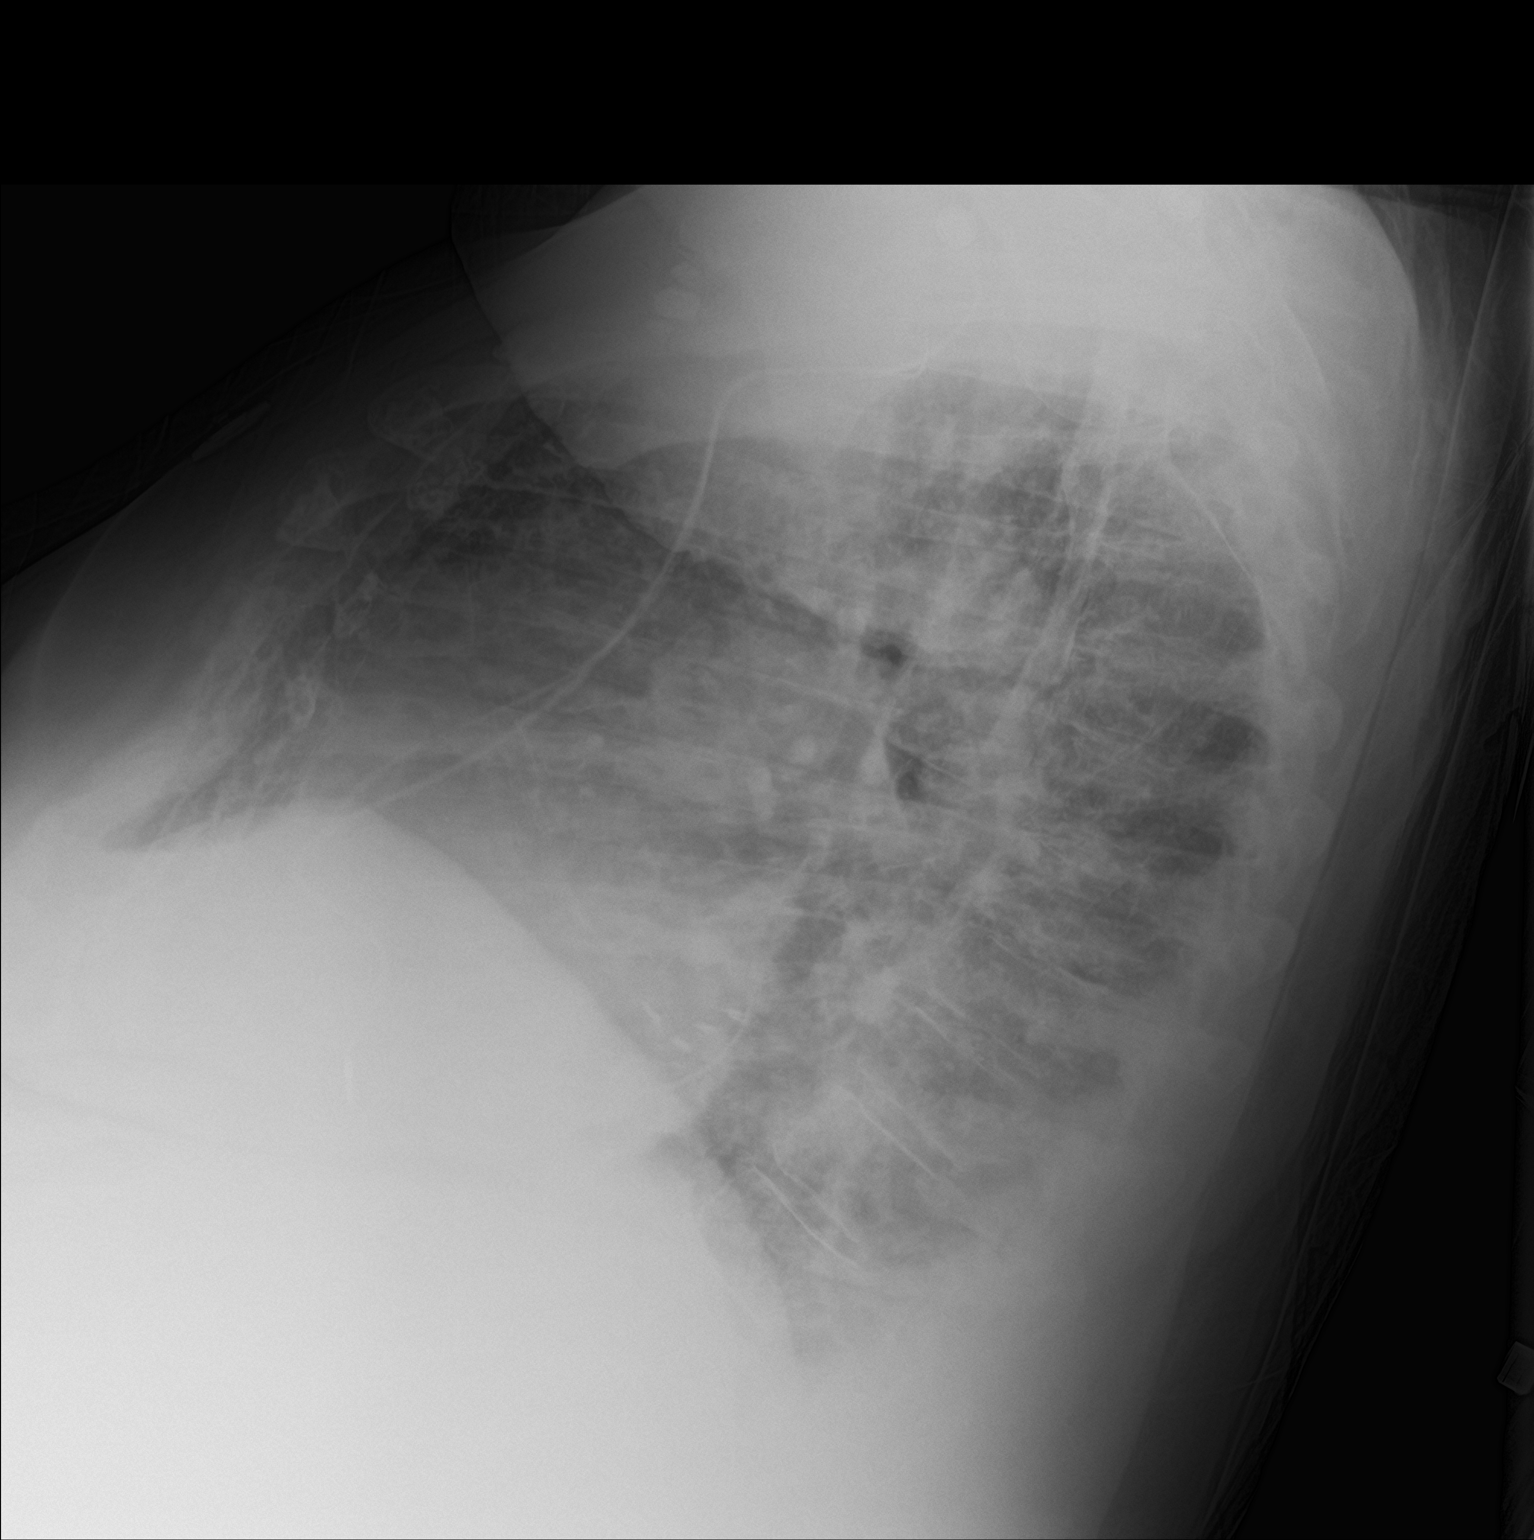

[chest ap]
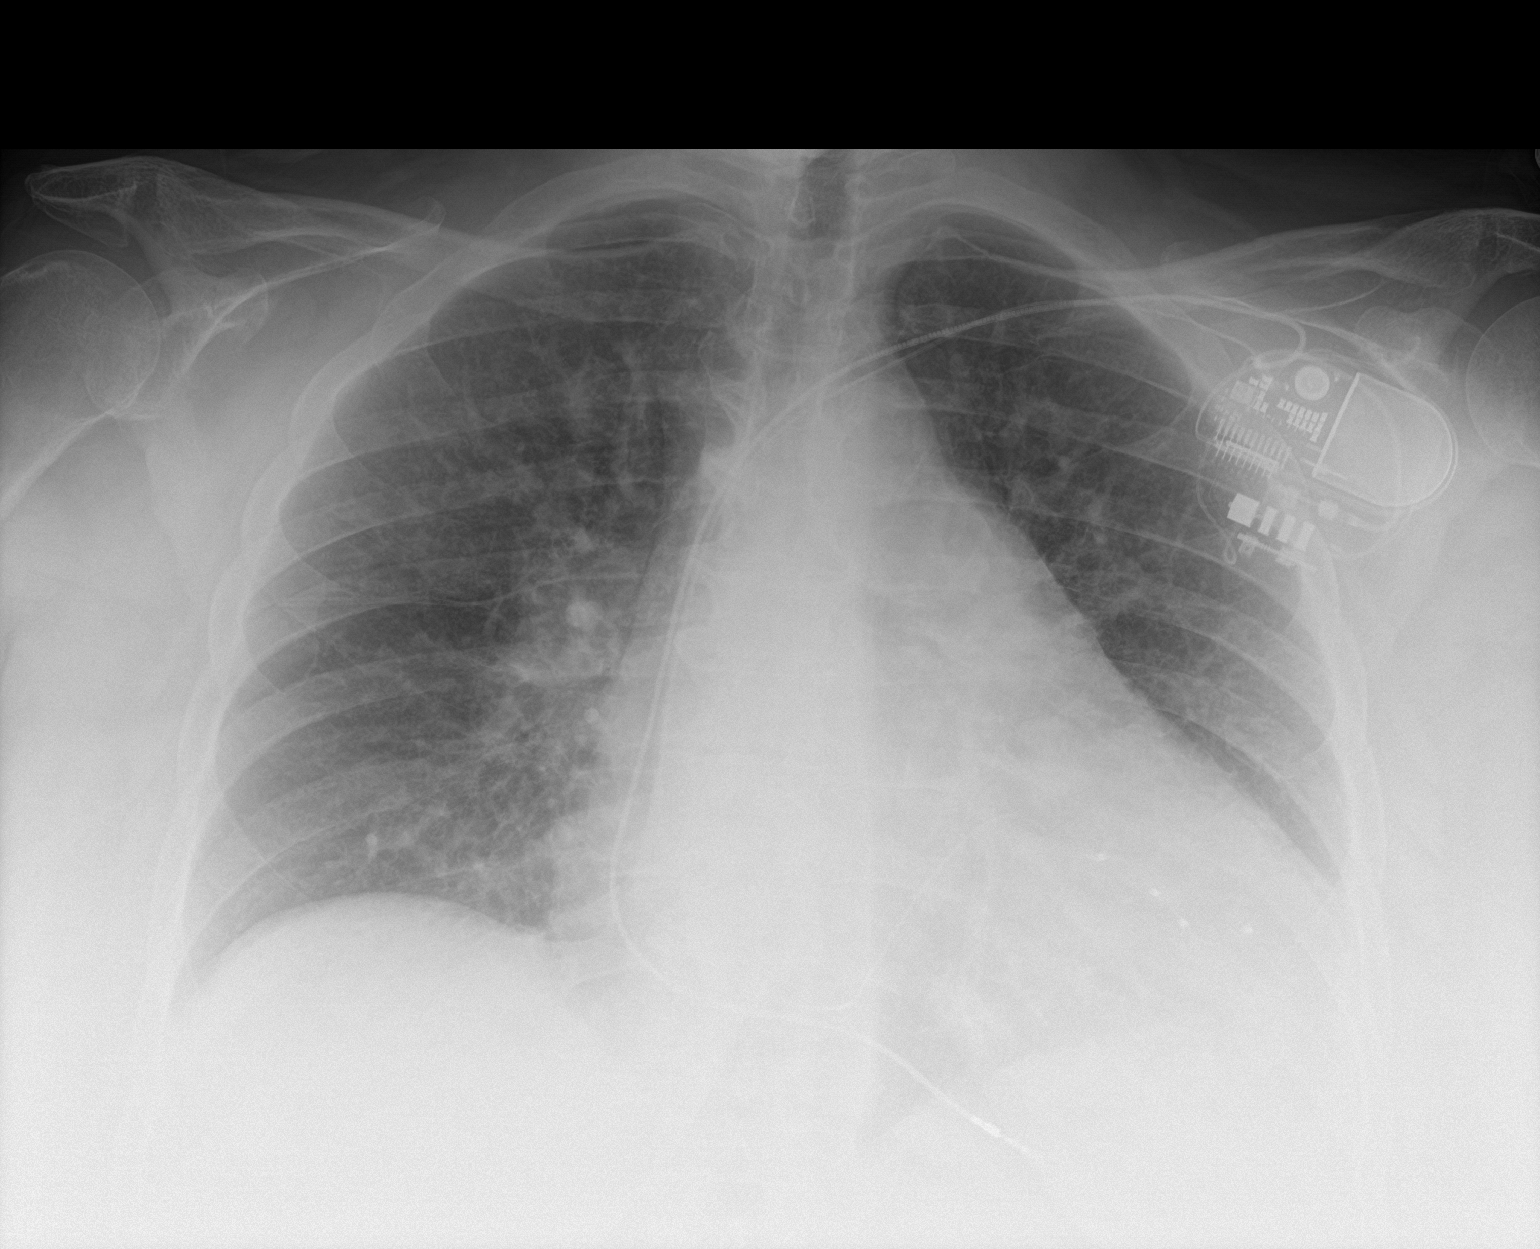

[2 of 2 positions shown; findings below may reference images not displayed]

FINDINGS: Mild cardiomegaly is noted. Left-sided pacemaker is unchanged in
position. No pneumothorax or pleural effusion is noted. No acute
pulmonary disease is noted. Bony thorax is unremarkable.
IMPRESSION: No active cardiopulmonary disease.

## 2018-04-14 IMAGING — CR DG CHEST 1V PORT
1 series · 1 of 1 positions shown · non-contrast
Comparison: 11/28/2015.

CLINICAL DATA: Right PICC placement.

EXAM:
PORTABLE CHEST 1 VIEW

[AP]
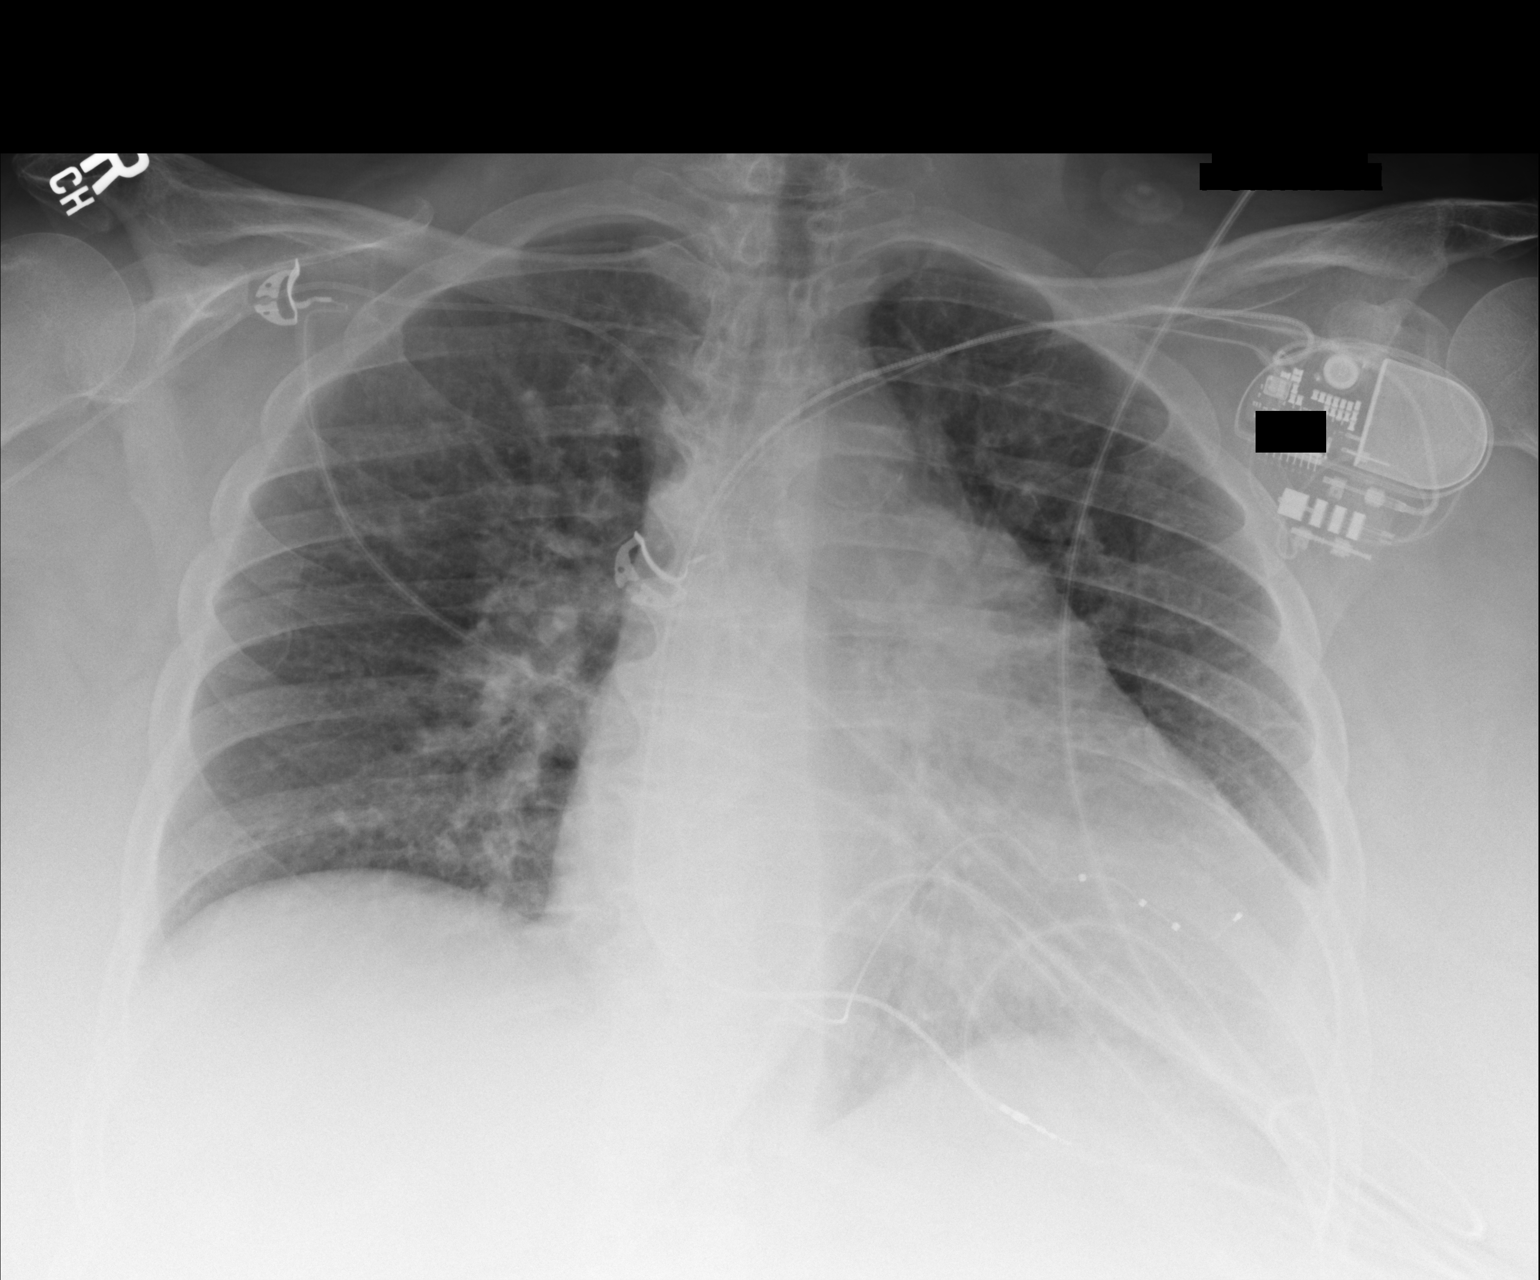

[1 of 1 positions shown; findings below may reference images not displayed]

FINDINGS: Interval right PICC extending into the inferior aspect of the
superior vena cava. The tip is poorly visualized and appears to be
within the inferior aspect of the superior vena cava. Stable
enlarged cardiac silhouette and left subclavian pacemaker leads. The
pulmonary vasculature and interstitial markings remain mildly
prominent. Thoracic spine degenerative changes.
IMPRESSION: 1. Right PICC tip in the inferior aspect of the superior vena cava.
If a position at the superior cavoatrial junction is desired, it
would be recommended that this be advanced 2 cm.
2. Stable cardiomegaly, mild pulmonary vascular congestion and mild
chronic interstitial lung disease
